# Patient Record
Sex: Female | Born: 1950 | Race: Black or African American | Hispanic: No | Smoking: Current every day smoker
Health system: Southern US, Community
[De-identification: ages and names within clinical notes are randomized; demographics above are authoritative.]

## PROBLEM LIST (undated history)

## (undated) DIAGNOSIS — E785 Hyperlipidemia, unspecified: Secondary | ICD-10-CM

## (undated) DIAGNOSIS — M310 Hypersensitivity angiitis: Secondary | ICD-10-CM

## (undated) DIAGNOSIS — I639 Cerebral infarction, unspecified: Secondary | ICD-10-CM

## (undated) DIAGNOSIS — C50912 Malignant neoplasm of unspecified site of left female breast: Secondary | ICD-10-CM

## (undated) DIAGNOSIS — J449 Chronic obstructive pulmonary disease, unspecified: Secondary | ICD-10-CM

## (undated) DIAGNOSIS — I10 Essential (primary) hypertension: Secondary | ICD-10-CM

## (undated) DIAGNOSIS — C50919 Malignant neoplasm of unspecified site of unspecified female breast: Secondary | ICD-10-CM

## (undated) DIAGNOSIS — C50911 Malignant neoplasm of unspecified site of right female breast: Secondary | ICD-10-CM

## (undated) HISTORY — DX: Hypersensitivity angiitis: M31.0

## (undated) HISTORY — DX: Malignant neoplasm of unspecified site of right female breast: C50.911

## (undated) HISTORY — DX: Chronic obstructive pulmonary disease, unspecified: J44.9

## (undated) HISTORY — PX: MASTECTOMY, RADICAL: SHX710

## (undated) HISTORY — PX: PORT-A-CATH REMOVAL: SHX5289

## (undated) HISTORY — PX: BREAST LUMPECTOMY: SHX2

## (undated) HISTORY — DX: Malignant neoplasm of unspecified site of left female breast: C50.912

## (undated) HISTORY — DX: Malignant neoplasm of unspecified site of unspecified female breast: C50.919

## (undated) HISTORY — DX: Cerebral infarction, unspecified: I63.9

---

## 2001-02-14 ENCOUNTER — Encounter (HOSPITAL_COMMUNITY): Admission: RE | Admit: 2001-02-14 | Discharge: 2001-03-16 | Payer: Self-pay | Admitting: Oncology

## 2001-02-14 ENCOUNTER — Encounter: Admission: RE | Admit: 2001-02-14 | Discharge: 2001-02-14 | Payer: Self-pay | Admitting: Oncology

## 2001-03-10 ENCOUNTER — Other Ambulatory Visit: Admission: RE | Admit: 2001-03-10 | Discharge: 2001-03-10 | Payer: Self-pay | Admitting: Family Medicine

## 2004-03-11 ENCOUNTER — Ambulatory Visit (HOSPITAL_COMMUNITY): Admission: RE | Admit: 2004-03-11 | Discharge: 2004-03-11 | Payer: Self-pay | Admitting: Family Medicine

## 2004-07-15 ENCOUNTER — Encounter (HOSPITAL_COMMUNITY): Admission: RE | Admit: 2004-07-15 | Discharge: 2004-07-16 | Payer: Self-pay | Admitting: Internal Medicine

## 2005-03-30 ENCOUNTER — Encounter (HOSPITAL_COMMUNITY): Admission: RE | Admit: 2005-03-30 | Discharge: 2005-04-29 | Payer: Self-pay | Admitting: Oncology

## 2005-03-30 ENCOUNTER — Ambulatory Visit (HOSPITAL_COMMUNITY): Payer: Self-pay | Admitting: Oncology

## 2005-03-30 ENCOUNTER — Encounter: Admission: RE | Admit: 2005-03-30 | Discharge: 2005-03-30 | Payer: Self-pay | Admitting: Oncology

## 2005-07-20 ENCOUNTER — Ambulatory Visit (HOSPITAL_COMMUNITY): Admission: RE | Admit: 2005-07-20 | Discharge: 2005-07-20 | Payer: Self-pay | Admitting: Internal Medicine

## 2005-07-20 ENCOUNTER — Encounter: Payer: Self-pay | Admitting: Internal Medicine

## 2005-07-20 ENCOUNTER — Ambulatory Visit: Payer: Self-pay | Admitting: Internal Medicine

## 2005-07-20 HISTORY — PX: COLONOSCOPY: SHX174

## 2006-03-08 ENCOUNTER — Ambulatory Visit (HOSPITAL_COMMUNITY): Admission: RE | Admit: 2006-03-08 | Discharge: 2006-03-08 | Payer: Self-pay | Admitting: Family Medicine

## 2006-03-15 ENCOUNTER — Encounter (INDEPENDENT_AMBULATORY_CARE_PROVIDER_SITE_OTHER): Payer: Self-pay | Admitting: Diagnostic Radiology

## 2006-03-15 ENCOUNTER — Encounter (INDEPENDENT_AMBULATORY_CARE_PROVIDER_SITE_OTHER): Payer: Self-pay | Admitting: *Deleted

## 2006-03-15 ENCOUNTER — Encounter: Admission: RE | Admit: 2006-03-15 | Discharge: 2006-03-15 | Payer: Self-pay | Admitting: Family Medicine

## 2006-03-22 ENCOUNTER — Ambulatory Visit (HOSPITAL_COMMUNITY): Admission: RE | Admit: 2006-03-22 | Discharge: 2006-03-22 | Payer: Self-pay | Admitting: General Surgery

## 2006-03-30 ENCOUNTER — Encounter: Admission: RE | Admit: 2006-03-30 | Discharge: 2006-03-30 | Payer: Self-pay | Admitting: Oncology

## 2006-03-30 ENCOUNTER — Encounter (HOSPITAL_COMMUNITY): Admission: RE | Admit: 2006-03-30 | Discharge: 2006-04-29 | Payer: Self-pay | Admitting: Oncology

## 2006-03-30 ENCOUNTER — Ambulatory Visit (HOSPITAL_COMMUNITY): Payer: Self-pay | Admitting: Oncology

## 2006-04-19 ENCOUNTER — Encounter: Admission: RE | Admit: 2006-04-19 | Discharge: 2006-04-19 | Payer: Self-pay | Admitting: General Surgery

## 2006-04-22 ENCOUNTER — Encounter: Admission: RE | Admit: 2006-04-22 | Discharge: 2006-04-22 | Payer: Self-pay | Admitting: General Surgery

## 2006-04-22 ENCOUNTER — Encounter (INDEPENDENT_AMBULATORY_CARE_PROVIDER_SITE_OTHER): Payer: Self-pay | Admitting: Specialist

## 2006-04-22 ENCOUNTER — Ambulatory Visit (HOSPITAL_BASED_OUTPATIENT_CLINIC_OR_DEPARTMENT_OTHER): Admission: RE | Admit: 2006-04-22 | Discharge: 2006-04-22 | Payer: Self-pay | Admitting: General Surgery

## 2006-04-23 ENCOUNTER — Emergency Department (HOSPITAL_COMMUNITY): Admission: EM | Admit: 2006-04-23 | Discharge: 2006-04-23 | Payer: Self-pay | Admitting: Emergency Medicine

## 2006-05-06 ENCOUNTER — Encounter (HOSPITAL_COMMUNITY): Admission: RE | Admit: 2006-05-06 | Discharge: 2006-06-05 | Payer: Self-pay | Admitting: Oncology

## 2006-05-06 ENCOUNTER — Encounter: Admission: RE | Admit: 2006-05-06 | Discharge: 2006-05-06 | Payer: Self-pay | Admitting: Oncology

## 2006-05-26 ENCOUNTER — Ambulatory Visit (HOSPITAL_BASED_OUTPATIENT_CLINIC_OR_DEPARTMENT_OTHER): Admission: RE | Admit: 2006-05-26 | Discharge: 2006-05-26 | Payer: Self-pay | Admitting: General Surgery

## 2006-05-31 ENCOUNTER — Ambulatory Visit (HOSPITAL_COMMUNITY): Payer: Self-pay | Admitting: Oncology

## 2006-06-09 ENCOUNTER — Encounter: Admission: RE | Admit: 2006-06-09 | Discharge: 2006-06-12 | Payer: Self-pay | Admitting: Oncology

## 2006-06-14 ENCOUNTER — Encounter: Admission: RE | Admit: 2006-06-14 | Discharge: 2006-06-14 | Payer: Self-pay | Admitting: Oncology

## 2006-06-14 ENCOUNTER — Encounter (HOSPITAL_COMMUNITY): Admission: RE | Admit: 2006-06-14 | Discharge: 2006-07-14 | Payer: Self-pay | Admitting: Oncology

## 2006-07-01 ENCOUNTER — Ambulatory Visit: Admission: RE | Admit: 2006-07-01 | Discharge: 2006-08-30 | Payer: Self-pay | Admitting: Radiation Oncology

## 2006-07-26 ENCOUNTER — Encounter: Admission: RE | Admit: 2006-07-26 | Discharge: 2006-07-26 | Payer: Self-pay | Admitting: Oncology

## 2006-07-26 ENCOUNTER — Ambulatory Visit (HOSPITAL_COMMUNITY): Payer: Self-pay | Admitting: Oncology

## 2006-07-26 ENCOUNTER — Encounter (HOSPITAL_COMMUNITY): Admission: RE | Admit: 2006-07-26 | Discharge: 2006-08-25 | Payer: Self-pay | Admitting: Oncology

## 2006-09-08 ENCOUNTER — Encounter (HOSPITAL_COMMUNITY): Admission: RE | Admit: 2006-09-08 | Discharge: 2006-09-13 | Payer: Self-pay | Admitting: Oncology

## 2006-09-16 ENCOUNTER — Encounter (HOSPITAL_COMMUNITY): Admission: RE | Admit: 2006-09-16 | Discharge: 2006-10-16 | Payer: Self-pay | Admitting: Oncology

## 2006-09-22 ENCOUNTER — Ambulatory Visit (HOSPITAL_COMMUNITY): Payer: Self-pay | Admitting: Oncology

## 2006-10-20 ENCOUNTER — Encounter (HOSPITAL_COMMUNITY): Admission: RE | Admit: 2006-10-20 | Discharge: 2006-11-19 | Payer: Self-pay | Admitting: Oncology

## 2006-10-27 ENCOUNTER — Ambulatory Visit (HOSPITAL_COMMUNITY): Admission: RE | Admit: 2006-10-27 | Discharge: 2006-10-27 | Payer: Self-pay | Admitting: Oncology

## 2006-11-10 ENCOUNTER — Ambulatory Visit (HOSPITAL_COMMUNITY): Payer: Self-pay | Admitting: Oncology

## 2007-01-05 ENCOUNTER — Encounter (HOSPITAL_COMMUNITY): Admission: RE | Admit: 2007-01-05 | Discharge: 2007-02-04 | Payer: Self-pay | Admitting: Oncology

## 2007-01-08 ENCOUNTER — Ambulatory Visit (HOSPITAL_COMMUNITY): Admission: RE | Admit: 2007-01-08 | Discharge: 2007-01-08 | Payer: Self-pay | Admitting: Family Medicine

## 2007-01-10 ENCOUNTER — Inpatient Hospital Stay (HOSPITAL_COMMUNITY): Admission: EM | Admit: 2007-01-10 | Discharge: 2007-01-25 | Payer: Self-pay | Admitting: Emergency Medicine

## 2007-01-11 ENCOUNTER — Ambulatory Visit: Payer: Self-pay | Admitting: Oncology

## 2007-01-13 ENCOUNTER — Ambulatory Visit: Payer: Self-pay | Admitting: Gastroenterology

## 2007-01-16 ENCOUNTER — Ambulatory Visit: Payer: Self-pay | Admitting: Internal Medicine

## 2007-01-17 ENCOUNTER — Ambulatory Visit: Payer: Self-pay | Admitting: Internal Medicine

## 2007-02-08 ENCOUNTER — Ambulatory Visit (HOSPITAL_COMMUNITY): Payer: Self-pay | Admitting: Oncology

## 2007-02-15 ENCOUNTER — Ambulatory Visit: Payer: Self-pay | Admitting: Internal Medicine

## 2007-03-22 ENCOUNTER — Encounter (HOSPITAL_COMMUNITY): Admission: RE | Admit: 2007-03-22 | Discharge: 2007-04-21 | Payer: Self-pay | Admitting: Oncology

## 2007-05-06 ENCOUNTER — Encounter (HOSPITAL_COMMUNITY): Admission: RE | Admit: 2007-05-06 | Discharge: 2007-06-05 | Payer: Self-pay | Admitting: Oncology

## 2007-05-06 ENCOUNTER — Ambulatory Visit (HOSPITAL_COMMUNITY): Payer: Self-pay | Admitting: Oncology

## 2007-06-08 ENCOUNTER — Ambulatory Visit (HOSPITAL_COMMUNITY): Admission: RE | Admit: 2007-06-08 | Discharge: 2007-06-08 | Payer: Self-pay | Admitting: *Deleted

## 2007-06-17 ENCOUNTER — Encounter (HOSPITAL_COMMUNITY): Admission: RE | Admit: 2007-06-17 | Discharge: 2007-07-17 | Payer: Self-pay | Admitting: *Deleted

## 2007-07-17 ENCOUNTER — Emergency Department (HOSPITAL_COMMUNITY): Admission: EM | Admit: 2007-07-17 | Discharge: 2007-07-17 | Payer: Self-pay | Admitting: Emergency Medicine

## 2007-08-10 ENCOUNTER — Encounter (HOSPITAL_COMMUNITY): Admission: RE | Admit: 2007-08-10 | Discharge: 2007-09-09 | Payer: Self-pay | Admitting: Oncology

## 2007-08-10 ENCOUNTER — Ambulatory Visit (HOSPITAL_COMMUNITY): Payer: Self-pay | Admitting: Oncology

## 2007-09-21 ENCOUNTER — Encounter (HOSPITAL_COMMUNITY): Admission: RE | Admit: 2007-09-21 | Discharge: 2007-10-21 | Payer: Self-pay | Admitting: Oncology

## 2007-09-26 ENCOUNTER — Ambulatory Visit (HOSPITAL_COMMUNITY): Payer: Self-pay | Admitting: Oncology

## 2007-12-14 ENCOUNTER — Ambulatory Visit (HOSPITAL_COMMUNITY): Payer: Self-pay | Admitting: Oncology

## 2008-03-07 ENCOUNTER — Ambulatory Visit (HOSPITAL_COMMUNITY): Payer: Self-pay | Admitting: Oncology

## 2008-06-27 ENCOUNTER — Ambulatory Visit (HOSPITAL_COMMUNITY): Payer: Self-pay | Admitting: Oncology

## 2008-08-14 ENCOUNTER — Ambulatory Visit (HOSPITAL_COMMUNITY): Payer: Self-pay | Admitting: Oncology

## 2008-10-25 ENCOUNTER — Ambulatory Visit (HOSPITAL_COMMUNITY): Payer: Self-pay | Admitting: Oncology

## 2009-02-07 ENCOUNTER — Ambulatory Visit (HOSPITAL_COMMUNITY): Payer: Self-pay | Admitting: Oncology

## 2009-07-05 ENCOUNTER — Encounter (HOSPITAL_COMMUNITY): Admission: RE | Admit: 2009-07-05 | Discharge: 2009-08-04 | Payer: Self-pay | Admitting: Oncology

## 2009-07-05 ENCOUNTER — Ambulatory Visit (HOSPITAL_COMMUNITY): Payer: Self-pay | Admitting: Oncology

## 2009-07-11 ENCOUNTER — Ambulatory Visit (HOSPITAL_COMMUNITY): Admission: RE | Admit: 2009-07-11 | Discharge: 2009-07-11 | Payer: Self-pay | Admitting: Family Medicine

## 2009-08-26 ENCOUNTER — Other Ambulatory Visit: Admission: RE | Admit: 2009-08-26 | Discharge: 2009-08-26 | Payer: Self-pay | Admitting: Family Medicine

## 2009-10-25 ENCOUNTER — Ambulatory Visit (HOSPITAL_COMMUNITY): Payer: Self-pay | Admitting: Oncology

## 2009-12-20 ENCOUNTER — Encounter (HOSPITAL_COMMUNITY): Admission: RE | Admit: 2009-12-20 | Discharge: 2010-01-19 | Payer: Self-pay | Admitting: Oncology

## 2009-12-20 ENCOUNTER — Ambulatory Visit (HOSPITAL_COMMUNITY): Payer: Self-pay | Admitting: Oncology

## 2010-02-14 ENCOUNTER — Ambulatory Visit (HOSPITAL_COMMUNITY): Payer: Self-pay | Admitting: Oncology

## 2010-04-23 ENCOUNTER — Ambulatory Visit (HOSPITAL_COMMUNITY): Payer: Self-pay | Admitting: Oncology

## 2010-07-16 ENCOUNTER — Ambulatory Visit (HOSPITAL_COMMUNITY): Admission: RE | Admit: 2010-07-16 | Discharge: 2010-07-16 | Payer: Self-pay | Admitting: Oncology

## 2010-07-23 ENCOUNTER — Ambulatory Visit (HOSPITAL_COMMUNITY): Payer: Self-pay | Admitting: Oncology

## 2010-07-23 ENCOUNTER — Encounter (HOSPITAL_COMMUNITY)
Admission: RE | Admit: 2010-07-23 | Discharge: 2010-08-22 | Payer: Self-pay | Source: Home / Self Care | Attending: Oncology | Admitting: Oncology

## 2010-09-19 ENCOUNTER — Encounter (HOSPITAL_COMMUNITY): Admission: RE | Admit: 2010-09-19 | Payer: Self-pay | Source: Home / Self Care | Admitting: Oncology

## 2010-09-19 ENCOUNTER — Ambulatory Visit (HOSPITAL_COMMUNITY): Admit: 2010-09-19 | Payer: Self-pay | Admitting: Oncology

## 2010-10-05 ENCOUNTER — Encounter (HOSPITAL_COMMUNITY): Payer: Self-pay | Admitting: Oncology

## 2010-10-30 ENCOUNTER — Other Ambulatory Visit (HOSPITAL_COMMUNITY): Payer: Medicare Other

## 2010-10-30 ENCOUNTER — Encounter (HOSPITAL_COMMUNITY): Payer: Medicare Other | Attending: Oncology

## 2010-10-30 DIAGNOSIS — C50919 Malignant neoplasm of unspecified site of unspecified female breast: Secondary | ICD-10-CM

## 2010-10-30 DIAGNOSIS — Z452 Encounter for adjustment and management of vascular access device: Secondary | ICD-10-CM

## 2010-12-03 LAB — COMPREHENSIVE METABOLIC PANEL
AST: 25 U/L (ref 0–37)
BUN: 8 mg/dL (ref 6–23)
CO2: 27 mEq/L (ref 19–32)
Chloride: 101 mEq/L (ref 96–112)
Creatinine, Ser: 0.81 mg/dL (ref 0.4–1.2)
GFR calc non Af Amer: >60 mL/min (ref >60)
Total Bilirubin: 0.4 mg/dL (ref 0.3–1.2)

## 2010-12-03 LAB — CBC
HCT: 41.1 % (ref 36.0–46.0)
Hemoglobin: 14.1 g/dL (ref 12.0–15.0)
RBC: 4.57 MIL/uL (ref 3.87–5.11)
WBC: 10.7 10*3/uL — ABNORMAL HIGH (ref 4.0–10.5)

## 2010-12-11 ENCOUNTER — Encounter (HOSPITAL_COMMUNITY): Payer: Medicare Other

## 2010-12-18 LAB — DIFFERENTIAL
Eosinophils Absolute: 0.1 10*3/uL (ref 0.0–0.7)
Eosinophils Relative: 1 % (ref 0–5)
Lymphs Abs: 2.4 10*3/uL (ref 0.7–4.0)
Monocytes Absolute: 0.6 10*3/uL (ref 0.1–1.0)
Monocytes Relative: 6 % (ref 3–12)

## 2010-12-18 LAB — COMPREHENSIVE METABOLIC PANEL
ALT: 18 U/L (ref 0–35)
AST: 21 U/L (ref 0–37)
Alkaline Phosphatase: 150 U/L — ABNORMAL HIGH (ref 39–117)
Glucose, Bld: 143 mg/dL — ABNORMAL HIGH (ref 70–99)
Potassium: 3.7 mEq/L (ref 3.5–5.1)
Sodium: 137 mEq/L (ref 135–145)
Total Protein: 7.2 g/dL (ref 6.0–8.3)

## 2010-12-18 LAB — CBC
MCHC: 33.5 g/dL (ref 30.0–36.0)
RBC: 4.56 MIL/uL (ref 3.87–5.11)
WBC: 10 10*3/uL (ref 4.0–10.5)

## 2011-01-05 ENCOUNTER — Other Ambulatory Visit: Payer: Self-pay | Admitting: General Surgery

## 2011-01-05 ENCOUNTER — Encounter (HOSPITAL_COMMUNITY): Payer: Medicare Other

## 2011-01-05 LAB — BASIC METABOLIC PANEL
Calcium: 9.5 mg/dL (ref 8.4–10.5)
Creatinine, Ser: 0.71 mg/dL (ref 0.4–1.2)
GFR calc non Af Amer: >60 mL/min (ref >60)
Sodium: 137 mEq/L (ref 135–145)

## 2011-01-05 LAB — SURGICAL PCR SCREEN: MRSA, PCR: NEGATIVE

## 2011-01-05 LAB — DIFFERENTIAL
Basophils Relative: 0 % (ref 0–1)
Eosinophils Absolute: 0.1 10*3/uL (ref 0.0–0.7)
Eosinophils Relative: 1 % (ref 0–5)
Monocytes Absolute: 0.5 10*3/uL (ref 0.1–1.0)
Monocytes Relative: 5 % (ref 3–12)

## 2011-01-05 LAB — CBC
MCH: 30.1 pg (ref 26.0–34.0)
MCHC: 33.1 g/dL (ref 30.0–36.0)
MCV: 90.9 fL (ref 78.0–100.0)
Platelets: 268 10*3/uL (ref 150–400)

## 2011-01-06 ENCOUNTER — Ambulatory Visit (HOSPITAL_COMMUNITY)
Admission: RE | Admit: 2011-01-06 | Discharge: 2011-01-06 | Disposition: A | Discharge status: Home / Self Care | Payer: Medicare Other | Source: Ambulatory Visit | Attending: General Surgery | Admitting: General Surgery

## 2011-01-06 DIAGNOSIS — Z79899 Other long term (current) drug therapy: Secondary | ICD-10-CM | POA: Insufficient documentation

## 2011-01-06 DIAGNOSIS — C50919 Malignant neoplasm of unspecified site of unspecified female breast: Secondary | ICD-10-CM | POA: Insufficient documentation

## 2011-01-06 DIAGNOSIS — I10 Essential (primary) hypertension: Secondary | ICD-10-CM | POA: Insufficient documentation

## 2011-01-06 DIAGNOSIS — Z452 Encounter for adjustment and management of vascular access device: Secondary | ICD-10-CM | POA: Insufficient documentation

## 2011-01-06 DIAGNOSIS — E119 Type 2 diabetes mellitus without complications: Secondary | ICD-10-CM | POA: Insufficient documentation

## 2011-01-07 NOTE — Op Note (Signed)
  NAME:  Madison Todd, Madison Todd NO.:  [id number]  MEDICAL RECORD NO.:  [id number]           PATIENT TYPE:  O  LOCATION:  DAYP                          FACILITY:  [facility]  PHYSICIAN:  Barbaraann Barthel, M.D. DATE OF BIRTH:  11/17/1950  DATE OF PROCEDURE:  01/06/2011 DATE OF DISCHARGE:                              OPERATIVE REPORT   SURGEON:  Barbaraann Barthel, MD  PREOPERATIVE DIAGNOSIS:  Status post insertion of right subclavian Port- A-Cath for treatment of carcinoma of the breast.  POSTOPERATIVE DIAGNOSIS:  Status post insertion of right subclavian Port- A-Cath for treatment of carcinoma of the breast.  PROCEDURE:  Removal of right subclavian Port-A-Cath.  SPECIMEN:  Port-A-Cath and infusion device.  WOUND CLASSIFICATION:  Clean.  NOTE:  This is a 60 year old black female who was treated for carcinoma of the breast with chemotherapy by Port-A-Cath and after having this in place for over 5 years, This was requested to be removed.  We discussed complications, not limited to but including bleeding, infection and catheter embolization.  Informed consent was obtained.  GROSS OPERATIVE FINDINGS:  The patient had the expected pseudocyst around the catheter and the infusion device.  There were no other problems.  TECHNIQUE:  The patient was placed in the supine position and then later in the Trendelenburg position, Her right hemithorax was prepped with Betadine solution and draped in the usual manner.  An incision was carried out over the palpated infusion device and we dissected through the pseudocapsule and grasped the Port-A-Cath infusion device and clamped the pseudocapsule around the catheter and removed this.  This capsule around the catheter tip was ligated with 3-0 Vicryl.  We then closed the wound after irrigating it with normal saline solution with 4- 0 Vicryl and then, the wound was closed subcuticularly with a 5-0 Vicryl suture.  Steri-Strips, 2 x 2 with  Neosporin dressing and an OpSite dressing was applied.  Prior to closure, all sponge, needle, and instrument counts were found to be correct.  Estimated blood loss was minimal.  The patient received 300 mL of crystalloids intraoperatively.  No drains were placed and there were no complications.     Barbaraann Barthel, M.D.     WB/MEDQ  D:  01/06/2011  T:  01/06/2011  Job:  [id number]  cc:   Ladona Horns. Mariel Sleet, MD Fax: [phone]  Electronically Signed by Barbaraann Barthel M.D. on 01/07/2011 03:36:07 PM

## 2011-01-27 NOTE — Assessment & Plan Note (Signed)
NAME:  JANARIA, MCCAMMON         CHART#:  [id number]   DATE:  02/15/2007                       DOB:  12-05-1950   CHIEF COMPLAINT:  Follow up hospitalization.   SUBJECTIVE:  Ms. Lengyel is 60 year old African American female who  had a very complicated hospital course. She was admitted to [facility] recently with epigastric pain and nausea. She was actually  scheduled to have an EGD as an outpatient earlier that week when she was  noted to have fever and generalized rash. It was felt that she had  diffuse varicella zoster , however after further evaluation by a  dermatologist, it was felt that she may have been having a drug reaction  to Aromasin. She had a very stormy course during her hospitalization.  She was found to have cardiomegaly and CHF. She also developed  respiratory failure and was placed on the ventilator. While hospitalized  on 01/17/07, she was found to have gall bladder wall thickening with gall  bladder sludge, could not exclude early acalculous coleocystitis,  questionable adenopathy at the peripancreatic and superior to the left  kidney, minimal non-specific echogenicity of the liver. She had a HIDA  scan which showed a small contracted gall bladder with a patent cystic  duct, duodenal gastric biliary reflux and no evidence of obstruction.  She had a CT angiography of the chest which showed cardiomegaly,  congestive heart failure, bilateral lower lobe atelectasis, and possibly  pneumonia. She is status post right mastectomy. She had a CT of the  abdomen and pelvis with contrast which showed mildly prominent  retroperitoneal upper abdominal lymph nodes, possibly reactive, mild  hepatic edema, which can be seen with hepatitis and right heart failure,  and mild sludge in the gall bladder. She was eventually transferred to  [facility] and spent 4 days there in  the ICU. She tells me that she has been home for about two  weeks now.  She tells me that her epigastric pain has resolved since she has been  home. She denies any nausea or vomiting. Her appetite is great. She did  have some constipation to begin with. She is taking an over-the-counter  stool softener. She is having good results. She has had to do 1 enema  since she has been home. She is on Nexium 40 mg daily and she denies any  problems with heartburn, indigestion, dysphagia, or odynophagia.   CURRENT MEDICATIONS:  See the updated list from 02/15/07.   ALLERGIES:  AROMASIN & HERCEPTIN (shortness of breath)   OBJECTIVE:  VITAL SIGNS:  Weight 155 1/2 pounds, height 53.75 inches,  temperature 97.7, blood pressure 110/60, pulse 72.  GENERAL:  Ms. Oats is a well developed, well nourished African  American female in no acute distress.  HEENT:  Sclerae clear, nonicteric, conjunctivae pink. Oropharynx pink  and moist without any lesions.  CHEST:  Regular rate and rhythm, normal S1, S2.  ABDOMEN:  Positive bowel sounds x4, no rebound, soft, nontender,  nondistended, without palpable mass or hepatosplenomegaly. No rebound  tenderness or guarding.  EXTREMITIES:  Without clubbing or edema or bilaterally.  SKIN:  She does have well healing circular lesions with hypopigmented  centers without any exudates at this point.   ASSESSMENT:  Ms. Trigueros is a 60 year old female with a very stormy  recent hospital course. It  appears that she was actually scheduled for  an EGD which was postponed due to what appeared to be a drug reaction to  Aromasin. It was actually felt that she may have diffuse varicella  zoster, nonetheless she ended up respiratory failure, and she was  eventually transferred to [facility]. She is doing quite well  since discharge. She denies any epigastric pain at this point. Her  appetite has resumed. Overall, she is doing quite well.   PLAN:  1. She is going to continue Nexium 40 mg daily.  2. Recheck LFTs and CBC given  her history of anemia.  3. Pending labs to see how she does, she may need follow up imaging of      her biliary system and gall bladder.  4. I would also consider following up on EGD, however I would like to      hold off until she recovers from this acute reaction.       Nicholas Lose, N.P.  Electronically Signed     R. Roetta Sessions, M.D.  Electronically Signed    KC/MEDQ  D:  02/16/2007  T:  02/17/2007  Job:  [id number]   cc:   Ladona Horns. Mariel Sleet, MD  Annia Friendly. Loleta Chance, MD

## 2011-01-27 NOTE — Discharge Summary (Signed)
NAME:  BEXLEY, LAUBACH NO.:  [id number]   MEDICAL RECORD NO.:  [id number]          PATIENT TYPE:  INP   LOCATION:  IC08                          FACILITY:  [facility]   PHYSICIAN:  Skeet Latch, DO    DATE OF BIRTH:  1951-03-24   DATE OF ADMISSION:  01/10/2007  DATE OF DISCHARGE:  05/12/2008LH                               DISCHARGE SUMMARY   DISCHARGE DIAGNOSES:  1. Acute respiratory distress.  2. Rash.  3. Vasculitis.  4. History of breast cancer.  5. Chronic obstructive pulmonary disease.  6. Varicella zoster.   BRIEF HOSPITAL COURSE:  Ms. Guedes is a 60 year old African  American female who had a very complicated hospital stay at [facility].  She was admitted through the ER for epigastric pain and  nausea.  The patient was scheduled to have an EGD as an outpatient  earlier, and was noted to have a fever and generalized rash and was sent  to the emergency room for evaluation.  Her rash progressed.  Dermatology  was consulted and it was felt that it may be diffuse varicella zoster.  The patient was placed on IV antiviral medication.  It was confusing if  the patient was having an actual drug reaction or if this was a  varicellar zoster.  During the hospital stay, the patient was found to  have cardiomegaly or CHF.  She subsequently developed respiratory  failure and was placed on ventilator.  The patient was kept on a  ventilator for a few days.  Pulmonology was consulted during that time  and followed patient also.  The patient was also found to have  gallbladder wall thickening and gallbladder sludge and questionable  adenopathy.  Pancreatic is superior to the left kidney.  The patient had  HIDA scan which showed small contracted gallbladder with hepatic cystic  duct, duodenal gastrobiliary reflux and no evidence of obstruction.  The  patient had multiple chest x-rays and CT of her abdomen with above  results.  Another CT of her abdomen and  pelvis showed mildly prominent  retroperitoneal in the upper abdomen, abdominal lymph nodes, possibly  reactive mild to hepatic edema which can be seen with hepatitis and  right heart failure and mild sludge in the gallbladder.  Secondary to  patient's complicated history with her rash, CHF and her respiratory  status, it was felt that the patient needed to be sent to [facility] for more acute care.   CONDITION ON DISCHARGE:  Stable.   CONSULTATIONS:  Hematology, Pulmonology, Dermatology.   DISPOSITION:  Burgess Estelle, the patient was transferred to [facility]  under the care of Dr. Mallie Mussel instable condition.      Skeet Latch, DO  Electronically Signed     SM/MEDQ  D:  02/28/2007  T:  02/28/2007  Job:  [id number]

## 2011-01-30 NOTE — Group Therapy Note (Signed)
NAME:  Madison Todd, Madison Todd NO.:  [id number]   MEDICAL RECORD NO.:  [id number]          PATIENT TYPE:  INP   LOCATION:  IC08                          FACILITY:  [facility]   PHYSICIAN:  Skeet Latch, DO    DATE OF BIRTH:  05-03-1951   DATE OF PROCEDURE:  01/22/2007  DATE OF DISCHARGE:                                 PROGRESS NOTE   SUBJECTIVE:  Patient is lying in bed.  Patient today is complaining of  shortness of breath.  Patient states that she has not exerted herself  anymore than usual but states that she is short of breath today.  Patient also states that her lesions continue to drain especially on her  lower extremities.  Patient states that she has not noticed any new  lesions at this time.  Patient denies any significant pain at this time  but is desiring to go home as soon as possible.   OBJECTIVE:  VITAL SIGNS:  Temperature 97.3, pulse 98, respirations 18,  blood pressure 134/92.  Patient is saturating 95% on 2 liters of oxygen.  CARDIOVASCULAR:  Regular rate and rhythm. No murmurs, rubs, or gallops.  S1-S2.  RESPIRATORY:  Some coarse sounds but clear to auscultation.  No rales,  rhonchi, or wheezing.  ABDOMEN: Soft, non-tender, non-distended.  EXTREMITIES: No cyanosis, clubbing, or erythema.  SKIN:  Still has multiple vascular-type lesions on upper and lower  extremities.  They seem to be draining more often especially on her feet  at this time.   LABORATORY DATA:  White blood cell count 183, hemoglobin 9.9, hematocrit  29.0, platelet count 505,000.  Sodium 139, potassium 4.5, chloride 104,  carbon dioxide, 31, glucose 244, BUN 8, creatinine 0.58, calcium 8.3.   ASSESSMENT/PLAN:  1. Varicella zoster.  Apparently the patient had a positive IgM.      Patient is no longer on Valtrex at this time.  Varicella outbreak      appears to be stable at this point.  Patient will need follow up      with dermatology as an outpatient.  2. Vasculitis.  Patient  continues to be on oral steroids with a taper      type dose.  Unknown which drug may have preceded the vasculitis at      this time.  3. History of breast cancer.  Patient will continue to be followed by      Oncology.  4. COPD.  Patient complains of shortness of breath today.  Her chest x-      ray did not show any acute process at this time, however will      decrease the schedule of her breathing treatments and also place      patient on inhalers.  5  Anticipate the patient will be discharged in 1-2 days if stable.  As  stated, the patient will need outpatient dermatologist.  Patient may  need an infectious disease consult in the near future depending on what  dermatology recommends.      Skeet Latch, DO    SM/MEDQ  D:  01/22/2007  T:  01/22/2007  Job:  [id number]

## 2011-01-30 NOTE — Group Therapy Note (Signed)
NAME:  Madison Todd, Madison Todd NO.:  [id number]   MEDICAL RECORD NO.:  [id number]          PATIENT TYPE:  INP   LOCATION:  IC08                          FACILITY:  [facility]   PHYSICIAN:  Edward L. Juanetta Gosling, M.D.DATE OF BIRTH:  11-16-1950   DATE OF PROCEDURE:  DATE OF DISCHARGE:                                 PROGRESS NOTE   SUBJECTIVE:  Madison Todd is much more alert, awake.  She is overall  doing better.  Her pulse in the 80's, blood pressure in the 130's.  She  now has an NG tube and her medicines are being switched to by NG.  She  is on tube feedings.   PHYSICAL EXAMINATION:  CHEST:  Exam shows her chest is clearer but she  still has some rhonchi bilaterally.  HEART:  Is regular without gallop.  ABDOMEN:  Soft.  EXTREMITIES:  Still show the skin changes.   LABORATORY DATA:  Her white count now is down 16,400, hemoglobin 9.6,  platelets 500,000 and blood gas 40% on 40% 600 rate of 10, 5 of PEEP,  shows pH 7.41, pCO2 of still 45, pO2 of 95.  Comp metabolic profile  blood sugars 272.  Electrolytes:  BUN and creatinine are normal.  SGOT  56, SGPT 150, albumin is 2.   ASSESSMENT:  She is better.   PLAN:  See if we can wean per protocol today.      Edward L. Juanetta Gosling, M.D.  Electronically Signed     ELH/MEDQ  D:  01/24/2007  T:  01/24/2007  Job:  [id number]

## 2011-01-30 NOTE — Consult Note (Signed)
NAME:  Madison Todd, Madison Todd NO.:  [id number]   MEDICAL RECORD NO.:  [id number]          PATIENT TYPE:  INP   LOCATION:  A341                          FACILITY:  [facility]   PHYSICIAN:  Kassie Mends, M.D.      DATE OF BIRTH:  1950/10/21   DATE OF CONSULTATION:  01/13/2007  DATE OF DISCHARGE:                                 CONSULTATION   REQUESTING PHYSICIAN:  [facility]   REASON FOR CONSULTATION:  Epigastric pain, EGD.   HISTORY OF PRESENT ILLNESS:  The patient is a 60 year old African-  American female with history of breast cancer (last chemotherapy in  February, 2008, discontinued secondary to abnormal MUGA per patient) who  complains of a couple of weeks' history of epigastric pain which is  worse postprandially.  She was actually scheduled to have an EGD as an  outpatient earlier this week when she was noted to have fever and  generalized rash.  She was sent to the emergency department for  evaluation and admitted.  She complains of epigastric pain which is  intermittent.  It seems to be worse with meals.  She has had some nausea  and heaves but no vomiting.  Denies any dysphagia or odynophagia,  heartburn.  She does complain of constipation, last bowel movement  approximately 1 week ago.  Denies any melena or bright red blood per  rectum.   HOME MEDICATIONS:  1. Vicodin 5/500 p.r.n.  2. Potassium chloride 20 mEq daily.  3. Aromasin 25 mg daily.  4. Lasix 40 mg daily.  5. Aspirin 81 mg daily.  6. Nexium 40 mg daily.  7. Diltiazem 120 mg daily.   ALLERGIES:  No known drug allergies.   PAST MEDICAL HISTORY:  1. History of breast cancer, initially of the right breast, status      post mastectomy several years ago.  She underwent chemotherapy at      the time.  She then developed left breast cancer and underwent      lumpectomy in August, 2007.  Last chemotherapy was in February,      2008, and had to be discontinued due to cardiomyopathy.  Last  radiation therapy was in the fall of 2007.  2. She has Port-A-Cath placement.  3. She had colonoscopy November, 2006, which revealed diminutive      ascending colon polyp measuring 3 mm.  4. COPD.  5. Hypertension.   FAMILY HISTORY:  Negative for colorectal cancer or chronic GI illnesses.   SOCIAL HISTORY:  She has 3 children.  Recently quit tobacco use  approximately 1 month ago.  No alcohol use.   REVIEW OF SYSTEMS:  GI:  See HPI.  CARDIOPULMONARY:  Complains of  shortness of breath.  No chest pain.   PHYSICAL EXAMINATION:  VITAL SIGNS:  T max 100.7, T current 98.9, pulse  84, respirations 20, blood pressure 138/78.  GENERAL:  Pleasant middle-aged African-American female who is resting  comfortably.  SKIN:  Warm and dry.  No jaundice.  She has pustule like lesions  generalized over her body.  HEENT:  Sclerae nonicteric.  Oropharyngeal mucosa moist and pink.  CHEST:  Lungs clear to auscultation.  CARDIAC:  Exam reveals regular rate and rhythm.  ABDOMEN:  Positive bowel sounds.  Abdomen soft.  She has mild epigastric  tenderness to deep palpation.  No organomegaly or masses.  No rebound,  tenderness or guarding.  No abdominal bruits or hernias.  EXTREMITIES:  No edema.   LABORATORY DATA:  White count 14,700, hemoglobin 12.3, hematocrit 35.9,  platelets 291,000.  Sed rate 49.  Sodium 138, potassium 4.2, BUN 7,  creatinine 0.83, glucose 150, total bilirubin 1, alkaline phosphatase  131, AST 31, ALT 25, albumin 3.3, amylase 28, lipase 11.  Blood cultures  negative x2.  Varicella zoster titers are pending.  Abdominal series  yesterday revealed prominent ascending colon stool.   IMPRESSION:  The patient is a 60 year old female with suspected diffuse  Varicella zoster who has had postprandial epigastric pain for 2-3 weeks  associated with nausea and also constipation.  No other gastrointestinal  symptoms.  Differential includes possibility of gastroesophageal reflux  disease, PUD,  biliary disease.  Constipation may be contributing to her  abdominal pain.  Doubt that she has herpes esophagitis, without  dysphagia or odynophagia.   RECOMMENDATIONS:  1. Continue proton pump inhibitor (PPI) therapy.  2. Will add MiraLax.  3. Consider abdominal ultrasound to rule out biliary etiology.  4. Recheck LFTs.  5. EGD once acute illness has resolved.      Tana Coast, P.A.      Kassie Mends, M.D.  Electronically Signed    LL/MEDQ  D:  01/13/2007  T:  01/13/2007  Job:  [id number]

## 2011-01-30 NOTE — Op Note (Signed)
NAME:  Madison Todd, Madison Todd NO.:  [id number]   MEDICAL RECORD NO.:  [id number]          PATIENT TYPE:  AMB   LOCATION:  DSC                          FACILITY:  [facility]   PHYSICIAN:  Leonie Man, M.D.   DATE OF BIRTH:  10/04/1950   DATE OF PROCEDURE:  04/22/2006  DATE OF DISCHARGE:                                 OPERATIVE REPORT   PREOPERATIVE DIAGNOSIS:  Carcinoma left breast.   POSTOPERATIVE DIAGNOSIS:  Carcinoma left breast.   PROCEDURE:  Partial mastectomy left breast with left sentinel lymph node  biopsy.   SURGEON:  Leonie Man, M.D.   ASSISTANT:  OR tech.   ANESTHESIA:  General.   NOTE:  Madison Todd is a 60 year old female who, on screening mammogram,  was noted to have a spiculated lesion in the central portion of the left  breast. On ultrasound and core biopsy, this shows an invasive carcinoma.  The patient comes to the operating room now for partial mastectomy and  sentinel lymph node biopsy with the possibility of axillary lymph node  dissection.  The risks and potential benefits of surgery have been fully  discussed with her, all questions answered, and consent obtained.   PROCEDURE:  Prior to the patient coming to the operating room, technetium  radio nucleotide dye is injected in the periareolar area and following the  induction of satisfactory general anesthesia, a solution of methylene blue  and saline was injected in the periareolar area and massaged into the  lymphatics.  The breast was then prepped and draped to be included in a  sterile operative field and with the use of the NeoProbe, an area in the  left axilla was identified as the most likely area of the sentinel lymph  node location.  A transverse incision is carried down through the breast and  deepened through subcutaneous tissues raising flaps superiorly and  inferiorly taking a large wedge of breast tissue.  This was carried all the  way down to the chest wall.  This tissue  was removed in its entirety and  forwarded for specimen mammography.  On specimen mammography as well as  touch prep, it seemed that the 9 o'clock margin was somewhat close to the  tumor.  I, therefore, went back and took several additional centimeters of  tissue in the 9 o'clock to 6 o'clock region and labeled this and forwarded  for pathologic evaluation.  With the use of the NeoProbe, we entered into  the axilla where a dissection was carried down up into the axillary content  where a hot blue node was identified, removed in its entirety, and forwarded  for pathologic evaluation.  Touch preps on the node were negative for  carcinoma.  There were no additional nodes noted within the axilla.  All  areas of dissection within the axilla and the breast were then checked for  hemostasis and noted be dry.  Sponge and instrument counts were verified.  The axillary wound was closed in layers with interrupted 2-0 Vicryl sutures  and 3-0 Vicryl suture.  The skin was closed with 5-0 Monocryl.  Similarly,  the breast tissue was reapproximated  using 2-0 Vicryl suture, the  subcutaneous tissues closed with 3-0 Vicryl, and the skin closed with 5-0  Monocryl sutures.  All wounds were then reinforced with Steri-Strips.  Sterile dressings were applied.  Anesthetic was reversed.  The patient was  moved from the operating room to the recovery room in stable condition.  She  tolerated the procedure well.      Leonie Man, M.D.  Electronically Signed     PB/MEDQ  D:  04/22/2006  T:  04/22/2006  Job:  [id number]

## 2011-01-30 NOTE — Procedures (Signed)
NAME:  Madison Todd, Madison Todd        ACCOUNT NO.:  [id number]   MEDICAL RECORD NO.:  [id number]          PATIENT TYPE:  REC   LOCATION:  RESP                          FACILITY:  [facility]   PHYSICIAN:  Edward L. Juanetta Gosling, M.D.DATE OF BIRTH:  Oct 11, 1950   DATE OF PROCEDURE:  DATE OF DISCHARGE:                            PULMONARY FUNCTION TEST   1. Spirometry shows a mild ventilatory defect without airflow      obstruction.  2. Lung volumes are normal.  3. DLCO is normal.  4. Arterial blood gases are normal.  5. There is no significant bronchodilator effect.      Edward L. Juanetta Gosling, M.D.  Electronically Signed     ELH/MEDQ  D:  10/30/2006  T:  10/30/2006  Job:  [id number]   cc:   Ladona Horns. Mariel Sleet, MD  Fax: [phone]

## 2011-01-30 NOTE — Procedures (Signed)
NAME:  Madison Todd, Madison Todd NO.:  [id number]   MEDICAL RECORD NO.:  [id number]          PATIENT TYPE:  REC   LOCATION:                                FACILITY:  [facility]   PHYSICIAN:  Dani Gobble, MD       DATE OF BIRTH:  10-08-1950   DATE OF PROCEDURE:  DATE OF DISCHARGE:  09/16/2006                                ECHOCARDIOGRAM   INDICATIONS:  60 year old female with breast cancer referred for  evaluation of LV function prior to chemotherapy.   The technical quality of the study is reasonable.   The aorta measures normally at 3 cm.   Left atrium measures normally at 3.7 cm.  The patient appeared to be in  sinus rhythm during this procedure.   The interventricular septum and posterior wall are within normal limits  except for basal septal hypertrophy.   The aortic valve is probably trileaflet, although the left coronary cusp  is not well visualized.  There is mild thickening without limitation to  leaflet excursion.  No aortic insufficiency is noted.  Doppler  interrogation of the aortic valve is within normal limits.   The mitral valve appears mildly thickened without limitation to leaflet  excursion.  No mitral valve prolapse noted.  Mild mitral regurgitation  is noted.  Doppler interrogation of mitral valve is within normal  limits.   The pulmonic valve is incompletely visualized but appeared to be grossly  structurally normal with trivial pulmonic insufficiency noted.   The tricuspid valve also appeared grossly structurally normal with  trivial tricuspid regurgitation noted.   The left ventricle is normal in size with the LV IDD measured at 4.5 cm  and the LV IC measured 3.8 cm.  Overall left systolic function is  diminished with an estimated ejection fraction of 30-40%.  There is  moderate global hypokinesis.   The right atrium and right ventricle are normal in size and right  ventricular systolic function may be just mildly diminished.  No  pericardial  effusion is appreciated.   IMPRESSION:  1. Mild basal septal hypertrophy.  2. Mild aortic sclerosis without stenosis.  3. Mild mitral regurgitation.  4. Normal left ventricular size with diminished ejection fraction      estimated at 30-40% with moderate global hypokinesis.           ______________________________  Dani Gobble, MD     AB/MEDQ  D:  09/16/2006  T:  09/16/2006  Job:  [id number]   cc:   Ladona Horns. Mariel Sleet, MD  Fax: [phone]

## 2011-01-30 NOTE — Consult Note (Signed)
NAME:  Madison Todd, Madison Todd NO.:  [id number]   MEDICAL RECORD NO.:  [id number]          PATIENT TYPE:  INP   LOCATION:  IC08                          FACILITY:  [facility]   PHYSICIAN:  Edward L. Juanetta Gosling, M.D.DATE OF BIRTH:  1951/07/14   DATE OF CONSULTATION:  DATE OF DISCHARGE:                                 CONSULTATION   REFERRING SERVICE:  [facility].   REASON FOR CONSULTATION:  Respiratory failure.   HISTORY:  Madison Todd is a 60 year old African American female who  presented to the hospital with abdominal pain and fever and a rash.  She  had been seen and given a shot for her abdominal pain.  She had a number  of tests done that were negative.  She was actually scheduled for EGD.  When she was brought for her EGD, she was found a significant rash and  fever and was brought to the emergency room for evaluation.  The rash  had apparently started about 2 weeks ago now, maybe longer than that,  started on her feet and progressed over her arms and legs and there are  capsules. Since then, she has developed more shortness of breath  yesterday, then became extremely short of breath with respiratory  distress and was brought to the ICU and intubated and placed on  mechanical ventilation.   PAST MEDICAL HISTORY:  Positive for breast cancer, COPD and  hypertension.   PAST SURGICAL HISTORY:  She had a lumpectomy and mastectomy.   SOCIAL HISTORY:  She does not use any alcohol.  She has about a 20- to  30-pack-year smoking history and should be noted that this history is  all from her chart, as she is intubated and unable to provide any  history.  She has been diagnosed with a disseminated varicella.  She  also is noted to have skin lesions that look more like a vasculitic  change.  She had been improving and getting ready for discharge, but  then had the acute change.   PHYSICAL EXAM:  GENERAL:  Exam shows that she is sedated, but still  moving around.  VITAL  SIGNS:  Pulse is in the 80s, sinus rhythm with PACs, blood  pressure 120/70, respirations 12.  HEENT:  Her pupils are reactive.  Her mucous membranes are dry.  Her  nose and throat are clear.  CHEST:  Shows rhonchi bilaterally.  HEART:  Regular.  I do not hear a gallop.  ABDOMEN:  Soft.  No masses are felt.  EXTREMITIES:  Showed no edema.  SKIN:  She has a rash which is dark, blistered with either black eschar  over it or blood.  She also has another rash which is diffuse, more  petechial in nature.  CNS:  Grossly intact.   LABORATORY WORK:  Liver functions show alkaline phosphatase of 213, SGOT  of 147, SGPT of 210; these have increased since the 7th.  Varicella  zoster on the 2nd was 2.29; on the 8th, 1.43.  Blood gas this morning on  40%, 600, rate of 14 and 5 of PEEP, pH 7.52, pCO2 of 35, PO2 of 74.  Cardiac panel:  Troponin is 0.16, MB 4.7 with CKs only 55.  BMET:  Glucose 229, BUN of 12, creatinine 0.63.  White count 17,400, hemoglobin  9.4, platelets 463,000, so it certainly is not related to  thrombocytopenia, but she does have a left shift and is said to have  metamyelocytes, myelocytes and bands seen on her CBC.   CT of chest:  No pulmonary embolism, right pleural effusion, left  pleural effusion, bilateral lower lobe consolidation, patchy opacity in  the lingula, interstitial prominence, enlarged heart, hemangioma in the  majority of the T11 vertebral body, mastectomy changes and a Port-A-Cath  in.   CT of abdomen and pelvis:  Pericholecystic edema with no gallbladder  wall thickening or gallstones, mild sludge in the gallbladder,  periportal edema.  Upper abdominal lymph nodes are mildly prominent,  retroperitoneal lymph nodes mildly prominent.  Pelvis negative.   IMPRESSION:  She has respiratory failure, probably multifactorial.  She  certainly could have pneumonia now; that is certainly what seems to be  on x-ray, but with disseminated varicella, she could certainly  have  lesions of varicella as well.  She has abnormalities in her liver that  are worse than they were on admission.  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  She is on prednisone.  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  She was seen by the dermatologist, felt to have varicella from the  dermatologist.  The other eruption was felt to possibly be a drug  eruption.  She did have a biopsy made of the skin.  She has been seen by  Dr. Mariel Sleet, who is her oncologist.  At this point, I think it is  reasonable to try to go ahead and reculture her, continue with her  treatment.  She is currently on Diflucan, Lovenox, Lasix, Xopenex,  vancomycin and insulin.  I will cut her ventilator rate slightly, since  she is alkalotic, continue with everything else, get a respiratory  culture I do not think she is going to be weanable today.   Thank you for allowing me to see her with you.      Edward L. Juanetta Gosling, M.D.  Electronically Signed     ELH/MEDQ  D:  01/23/2007  T:  01/24/2007  Job:  [id number]

## 2011-01-30 NOTE — Op Note (Signed)
NAME:  Madison Todd, Madison Todd        ACCOUNT NO.:  [id number]   MEDICAL RECORD NO.:  [id number]          PATIENT TYPE:  AMB   LOCATION:  DAY                           FACILITY:  [facility]   PHYSICIAN:  R. Roetta Sessions, M.D. DATE OF BIRTH:  1951-01-11   DATE OF PROCEDURE:  07/20/2005  DATE OF DISCHARGE:                                 OPERATIVE REPORT   PROCEDURE:  Screening colonoscopy/colonoscopy with biopsy.   ENDOSCOPIST:  Gerrit Friends. Rourk, M.D.   INDICATIONS FOR PROCEDURE:  The patient is a 60 year old lady referred at  the courtesy of Dr. Mariel Sleet for colorectal cancer screening.  She has  never had her lower GI tract imaged.  There is no family history of  colorectal neoplasia. She is devoid of any lower GI tract symptoms, however,  she does have a personal history of breast cancer status post right  mastectomy.  Colonoscopy is now being done as a high-risk screening  maneuver.  This approach has been discussed with the patient at length; the  potential risks, benefits, and alternatives have been reviewed; questions  answered. She is agreeable.  Please see the documentation in the medical  record.   PROCEDURE NOTE:  O2 saturation, blood pressure, pulse and respirations were  monitored throughout the entire procedure.   CONSCIOUS SEDATION:  Versed 3 mg IV, Demerol 75 mg IV in divided doses.   INSTRUMENT:  Olympus video chip system.   FINDINGS:  Digital rectal exam revealed no abnormalities.   ENDOSCOPIC FINDINGS:  The prep was good.   RECTUM:  Examination of the rectal mucosa including the retroflex view of  the anal verge revealed no abnormalities. There was some blood tinged mucous  and focal irritation of the distal rectal mucosa consistent with enema  tipped trauma.  Otherwise the rectal mucosa appeared normal.   COLON:  The colonic mucosa was surveyed from the rectosigmoid junction  through the left transverse, right colon to the area of the appendiceal  orifice,  ileocecal valve, and cecum.  These structures were well seen and  photographed for the record.   From this level the scope was slowly withdrawn.  All previously mentioned  mucosal surfaces were again seen.  The patient had a 3-mm polyp between two  folds in the mid ascending colon, otherwise the colonic mucosa appeared  normal.  This polyp was removed with cold biopsy forceps technique.  The  patient tolerated the procedure well and was reacted in endoscopy.   IMPRESSION:  1.  Diminutive ascending colon polyp, status post resection as described      above, otherwise normal colonic mucosa.  2.  Normal-appearing rectal mucosa -- see above.   RECOMMENDATIONS:  1.  Followup on path.  2.  Further recommendations to follow.      Jonathon Bellows, M.D.  Electronically Signed     RMR/MEDQ  D:  07/20/2005  T:  07/20/2005  Job:  [id number]   cc:   Ladona Horns. Mariel Sleet, MD  Fax: [phone]   Annia Friendly. Loleta Chance, MD  Fax: [phone]

## 2011-01-30 NOTE — Op Note (Signed)
NAME:  CHRISMA, HURLOCK NO.:  [id number]   MEDICAL RECORD NO.:  [id number]          PATIENT TYPE:  AMB   LOCATION:  DSC                          FACILITY:  [facility]   PHYSICIAN:  Leonie Man, M.D.   DATE OF BIRTH:  [birth year]   DATE OF PROCEDURE:  05/26/2006  DATE OF DISCHARGE:                                 OPERATIVE REPORT   PREOPERATIVE DIAGNOSES:  1. Carcinoma of breast bilaterally.  2. Poor venous access.   POSTOPERATIVE DIAGNOSES:  1. Carcinoma of breast bilaterally.  2. Poor venous access.   PROCEDURE:  Port-A-Cath implantation.   SURGEON:  Leonie Man, M.D.   ASSISTANT:  OR nurse.   ANESTHESIA:  MAC.   SPECIMENS:  There were no specimens sent to the lab.   ESTIMATED BLOOD LOSS:  Minimal.   COMPLICATIONS:  There were no complications.   DISPOSITION:  The patient returned to the PACU in excellent condition.   NOTE:  Miss Shurtz is a 60 year old female with metachronous bilateral  breast cancers.  She is status post recent partial mastectomy and axillary  sentinel lymph node biopsy.  She returns to the operating room now for  implantation of venous access device so as to facilitate her chemotherapy.  The patient understands the risks and potential benefits of surgery.  She  gives consent to the same.   With the patient positioned in the Trendelenburg position, after the  anterior neck and chest were prepped and draped to be included in the  sterile operative field, I infiltrate the right subclavian region with 1%  lidocaine placed.  A subclavian stick is made into the right subclavian vein  and a guide wire is threaded into the superior vena cava under fluoroscopic  guidance.  I then create a pocket on the anterior chest wall by infiltrating  the anterior chest wall with 0.5% Marcaine with epinephrine.  A transverse  incision is made on the chest wall and a pocket is created by dissecting  down, removing the subcutaneous fat down to  the pectoralis fascia.  I then  created a tunnel from the pocket up to the shoulder site, through which a  Silastic catheter for the Port-A-Cath was pulled through.  A dilator and  introducer were placed over the guide wire and then passed into the central  venous system, using a Seldinger technique type technique.  The position of  the dilator and introducer was confirmed on fluoroscopy.  The dilator and  introducer were then removed, as well as the guide wire, and the Port-A-Cath  was threaded into the central venous system and positioned with its tip at  the atrial-vena caval junction.  Inflow of heparinized saline and blood  return were noted to be excellent.  The Port-A-Cath reservoir was then  attached to the external portion of the Silastic catheter at the pocket and  secured.  Inflow of heparinized saline and blood return were again noted to  be excellent.  The Port-A-Cath was then placed into the pocket and sutured  in place with 2-0 silk sutures.  All areas of dissection were checked for  hemostasis and noted to be  dry.  Sponge and instrument counts were verified.  Both wounds were closed in 2 layers using interrupted 3-0 Vicryl and 4-0  Monocryl sutures.  The wounds were then reinforced with Steri-Strips.  The  Port-A-Cath reservoir was then flushed with concentrated heparin solution.  A sterile dressing was applied and the anesthetic reversed.  The patient was  removed from the operating room to the recovery room in stable condition.  She tolerated the procedure well.      Leonie Man, M.D.  Electronically Signed     PB/MEDQ  D:  05/26/2006  T:  05/26/2006  Job:  [id number]   cc:   Ladona Horns. Mariel Sleet, MD  Leonie Man, MD

## 2011-01-30 NOTE — H&P (Signed)
NAME:  Madison Todd, Madison Todd NO.:  [id number]   MEDICAL RECORD NO.:  [id number]          PATIENT TYPE:  INP   LOCATION:  A328                          FACILITY:  [facility]   PHYSICIAN:  Skeet Latch, DO    DATE OF BIRTH:  Jan 25, 1951   DATE OF ADMISSION:  01/10/2007  DATE OF DISCHARGE:  LH                              HISTORY & PHYSICAL   PRIMARY CARE PHYSICIAN:  Dr. Loleta Chance.   CHIEF COMPLAINT:  Epigastric pain and rash.   HISTORY OF PRESENT ILLNESS:  Madison Todd is a 60 year old African  American female who presents complaining of abdominal pain and fever.  The patient states that she began to have abdominal pain and a rash that  started this past Thursday.  The patient was seen by her primary care  physician this past weekend, was given a pain shot for her abdominal  pain.  Labs were drawn and a KUB was done, which were all negative.  The  patient was referred for an EGD which was supposed to be done today.  Upon getting her EGD, she was found to have a significant rash and  fever, so this was cancelled.  The patient was sent to the ER for  evaluation.   The patient states that she had a rash that started on her feet this  past Thursday, and it has, over the last few days, progressed over her  arms and legs.  The patient presented to the emergency room with this  pustular rash, not very pruritic at this time, but it is spreading.  The  patient states on my exam that her abdominal pain is gone, and she is  feeling fairly well at this moment.   PAST MEDICAL HISTORY:  1. Breast cancer.  2. COPD.  3. Hypertension.   PAST SURGICAL HISTORY:  1. Lumpectomy.  2. Mastectomy.   SOCIAL HISTORY:  Denies drinking, any drug use, but was a one-pack per  day for over 20 years smoker.  She just recently quit.   CURRENT MEDICATIONS:  1. __________ 120 mg once a day.  2. Vicodin 5/500 as needed.  3. Potassium chloride 20 mEq once a day.  4. Aromasin 25 mg once a day.  5.  Furosemide 40 mg once a day.  6. Asendin 81 mg a day.  7. Nexium 40 mg daily.   DRUG ALLERGIES:  NO KNOWN DRUG ALLERGIES.   REVIEW OF SYSTEMS:  SKIN:  Positive pustular rash.  HEENT:  Negative.  RESPIRATORY:  Denies any cough, wheezing, dyspnea.  GI:  Has some  epigastric pain.  Denies nausea, vomiting, diarrhea.  MUSCULOSKELETAL:  No arthralgias, back, neck pain.  NEUROLOGIC:  No headache, blurry  vision.  RHEUMATOLOGIC:  No anemia.   PHYSICAL EXAMINATION:  GENERAL:  The patient is awake, alert, and  oriented x3.  No acute distress.  VITAL SIGNS:  Temperature 98.8, pulse 89, respirations 20, blood  pressure is 103/66.  The patient is sating 93% on room air.  HEENT:  Head is atraumatic, normocephalic.  No scleral icterus.  No JVD  or thyromegaly.  No lymphadenopathy noted.  LUNGS:  Clear to  auscultation bilaterally.  No rales, rhonchi, or  wheezing.  CARDIOVASCULAR:  S1-S2.  No rubs, gallops, murmurs.  ABDOMEN:  No palpable epigastric pain on palpation and no rigidity, or  guarding.  Positive bowel sounds.  EXTREMITIES:  No clubbing, cyanosis, or edema.  SKIN:  Diffuse pustular rash on bilateral feet, legs, arms.  No  appreciated rash on face, back, abdomen, or oral mucosa.   LABORATORY:  White blood cell count 14.8, hemoglobin 13.4, hematocrit  39.7, platelets 255, neutrophils 85, lymphocytes 8.  Sed rate is 49.  Sodium 135, potassium 3.7, chloride 101, CO2 24, glucose 132, BUN 8,  creatinine 0.67.  AST 31, ALT 25, amylase 28, lipase 11.  Urine:  Negative glucose, moderate bilirubin, moderate blood, negative for  nitrites, small amount of leukocytes, 11-20 white blood cells, 7-10 red  blood cells, rare bacteria.   The patient did have a MUGA scan done on January 05, 2007, which showed  progressive global hypokinesis, with calculated ejection fraction  approximately 32% compared with 41% previously.  No focal wall motion  abnormalities.  She did have a x-ray done on January 08, 2007, which  showed nonspecific, nonobstructive bowel gas pattern.   ASSESSMENT:  1. Epigastric pain.  2. Pustule rash.  3. Breast cancer.  4. Hypertension.  5. Chronic obstructive pulmonary disease.   PLAN:  1. The patient be admitted to a general medical bed.  2. The patient was given Rocephin and Bactrim in the emergency room.      This will be continued.  3. Unsure if this rash is related to medications or a contact type of      rash.  4. ER has done an RPR and blood cultures.  We will await the results      of these tests.  5. Probably, we will get a dermatological consult to evaluate rash.  6. For abdominal pain, the patient was due for an EGD today.  We will      hold until the rash is not as progressive.  I do not feel the EGD      is critical at this time, but the patient will need an EGD in the      near future.  7. The patient will be kept on GI prophylaxis as well as DVT      prophylaxis.  8. For breast cancer, the patient was in-house and had an appointment      with Dr. Mariel Sleet.  We will consult Dr.      Mariel Sleet  to follow the patient while she stays in the hospital at      this time.  9. Will get a repeat CBC, BMP in the a.m.  10.The patient will be followed closely.      Skeet Latch, DO  Electronically Signed     SM/MEDQ  D:  01/10/2007  T:  01/10/2007  Job:  [id number]   cc:   Ladona Horns. Mariel Sleet, MD  Fax: [phone]   Annia Friendly. Loleta Chance, MD  Fax: [phone]

## 2011-06-23 LAB — CBC
HCT: 41.5
MCV: 88.1
Platelets: 308
WBC: 9.9

## 2011-06-23 LAB — COMPREHENSIVE METABOLIC PANEL
AST: 24
Albumin: 3.7
BUN: 5 — ABNORMAL LOW
CO2: 28
Calcium: 9.5
Chloride: 102
Creatinine, Ser: 0.65
GFR calc Af Amer: >60
GFR calc non Af Amer: >60
Total Bilirubin: 0.5

## 2011-06-23 LAB — DIFFERENTIAL
Eosinophils Absolute: 0.1 — ABNORMAL LOW
Eosinophils Relative: 1
Lymphs Abs: 3
Monocytes Relative: 7

## 2011-06-30 LAB — COMPREHENSIVE METABOLIC PANEL
ALT: 18
Alkaline Phosphatase: 162 — ABNORMAL HIGH
BUN: 4 — ABNORMAL LOW
CO2: 28
Chloride: 103
GFR calc non Af Amer: >60
Glucose, Bld: 231 — ABNORMAL HIGH
Potassium: 3.2 — ABNORMAL LOW
Sodium: 137
Total Bilirubin: 0.5

## 2011-06-30 LAB — CBC
HCT: 38.5
Hemoglobin: 12.8
RBC: 4.4

## 2011-06-30 LAB — DIFFERENTIAL
Basophils Absolute: 0
Basophils Relative: 0
Eosinophils Absolute: 0.1
Monocytes Relative: 5
Neutro Abs: 5.2
Neutrophils Relative %: 66

## 2011-07-21 ENCOUNTER — Encounter (HOSPITAL_COMMUNITY): Payer: Medicare Other | Attending: Oncology

## 2011-07-21 DIAGNOSIS — C50919 Malignant neoplasm of unspecified site of unspecified female breast: Secondary | ICD-10-CM

## 2011-07-21 DIAGNOSIS — J4489 Other specified chronic obstructive pulmonary disease: Secondary | ICD-10-CM | POA: Insufficient documentation

## 2011-07-21 DIAGNOSIS — M310 Hypersensitivity angiitis: Secondary | ICD-10-CM | POA: Insufficient documentation

## 2011-07-21 DIAGNOSIS — Z853 Personal history of malignant neoplasm of breast: Secondary | ICD-10-CM | POA: Insufficient documentation

## 2011-07-21 DIAGNOSIS — J449 Chronic obstructive pulmonary disease, unspecified: Secondary | ICD-10-CM | POA: Insufficient documentation

## 2011-07-21 DIAGNOSIS — Z09 Encounter for follow-up examination after completed treatment for conditions other than malignant neoplasm: Secondary | ICD-10-CM | POA: Insufficient documentation

## 2011-07-21 DIAGNOSIS — I2589 Other forms of chronic ischemic heart disease: Secondary | ICD-10-CM | POA: Insufficient documentation

## 2011-07-21 LAB — CBC
Hemoglobin: 15.2 g/dL — ABNORMAL HIGH (ref 12.0–15.0)
MCH: 30.2 pg (ref 26.0–34.0)
Platelets: 268 10*3/uL (ref 150–400)
RBC: 5.03 MIL/uL (ref 3.87–5.11)
WBC: 9.2 10*3/uL (ref 4.0–10.5)

## 2011-07-21 LAB — COMPREHENSIVE METABOLIC PANEL
ALT: 17 U/L (ref 0–35)
AST: 22 U/L (ref 0–37)
Alkaline Phosphatase: 147 U/L — ABNORMAL HIGH (ref 39–117)
CO2: 27 mEq/L (ref 19–32)
Calcium: 9.7 mg/dL (ref 8.4–10.5)
GFR calc Af Amer: >90 mL/min (ref >90)
Potassium: 3.1 mEq/L — ABNORMAL LOW (ref 3.5–5.1)
Sodium: 134 mEq/L — ABNORMAL LOW (ref 135–145)
Total Bilirubin: 0.3 mg/dL (ref 0.3–1.2)

## 2011-07-21 LAB — DIFFERENTIAL
Basophils Relative: 0 % (ref 0–1)
Monocytes Relative: 6 % (ref 3–12)
Neutro Abs: 5.4 10*3/uL (ref 1.7–7.7)
Neutrophils Relative %: 59 % (ref 43–77)

## 2011-07-21 NOTE — Progress Notes (Signed)
Labs drawn today for cbc/diff.cmp 

## 2011-07-22 ENCOUNTER — Encounter (HOSPITAL_COMMUNITY): Payer: Self-pay | Admitting: Oncology

## 2011-07-22 ENCOUNTER — Encounter (HOSPITAL_BASED_OUTPATIENT_CLINIC_OR_DEPARTMENT_OTHER): Payer: Medicare Other | Admitting: Oncology

## 2011-07-22 DIAGNOSIS — C50919 Malignant neoplasm of unspecified site of unspecified female breast: Secondary | ICD-10-CM

## 2011-07-22 DIAGNOSIS — J449 Chronic obstructive pulmonary disease, unspecified: Secondary | ICD-10-CM | POA: Insufficient documentation

## 2011-07-22 DIAGNOSIS — I429 Cardiomyopathy, unspecified: Secondary | ICD-10-CM | POA: Insufficient documentation

## 2011-07-22 DIAGNOSIS — C50912 Malignant neoplasm of unspecified site of left female breast: Secondary | ICD-10-CM

## 2011-07-22 DIAGNOSIS — M310 Hypersensitivity angiitis: Secondary | ICD-10-CM | POA: Insufficient documentation

## 2011-07-22 HISTORY — DX: Chronic obstructive pulmonary disease, unspecified: J44.9

## 2011-07-22 HISTORY — DX: Malignant neoplasm of unspecified site of unspecified female breast: C50.919

## 2011-07-22 HISTORY — DX: Malignant neoplasm of unspecified site of left female breast: C50.912

## 2011-07-22 NOTE — Patient Instructions (Signed)
[facility]  Discharge Instructions  RECOMMENDATIONS MADE BY THE CONSULTANT AND ANY TEST RESULTS WILL BE SENT TO YOUR REFERRING DOCTOR.   EXAM FINDINGS BY MD TODAY AND SIGNS AND SYMPTOMS TO REPORT TO CLINIC OR PRIMARY MD: you are doing well.  Report any new lumps, bone pain or shortness of breath.  MEDICATIONS PRESCRIBED:  Increase your potassium to 10 mEq three times daily for 14 days, then two times daily for 14 days then daily.     INSTRUCTIONS GIVEN AND DISCUSSED: Need your potassium level checked in 1 month  SPECIAL INSTRUCTIONS/FOLLOW-UP: Lab work Needed 1year and Return to Clinic on 1 year to see Dr. Mariel Sleet   I acknowledge that I have been informed and understand all the instructions given to me and received a copy. I do not have any more questions at this time, but understand that I may call the [facility] at [facility] at [phone] during business hours should I have any further questions or need assistance in obtaining follow-up care.    __________________________________________  _____________  __________ Signature of Patient or Authorized Representative            Date                   Time    __________________________________________ Nurse's Signature

## 2011-07-22 NOTE — Progress Notes (Signed)
Madison Courier, MD [address] [place] [place] [address]  1. Left-sided Breast cancer   2. Leukocytoclastic vasculitis   3. Cardiomyopathy   4. COPD (chronic obstructive pulmonary disease)     INTERVAL HISTORY: Madison Todd 60 y.o. female returns for  regular  visit for followup of Left sided breast cancer: Dx in August 2007.  Stage II (T2 N0).  ER 73%, PR 90%, Her2 positive, Ki-67 19%.  2.1 cm in size.  Agreed to only have radiation, Herceptin, and Aromasin.  Right sided breast cancer: Stage II (T2 N0 M0).  2.5 cm in size.  Surgery on 04/28/1996 with axillary dissection on 05/10/1996.  11 negative nodes.  Treated with AC x 4 cycles for ER negative, PR positive at only 20%.   The patient reports that she has been having some right sided neck pain that radiates to her right deltoid area of arm.  She is seeing her PCP about this problem.  He prescribed her some tramadol for this problem.  She relates that lying down makes her pain worse.  The tramadol helps with the discomfort.  She explains that she believes it is related to stress and taking care of her aunt.  Otherwise, the patient denies any complaints.  She continues to smoke tobacco.  Patient is due to have her next mammogram this month.  Past Medical History  Diagnosis Date  . Leukocytoclastic vasculitis   . COPD (chronic obstructive pulmonary disease)   . Diabetes mellitus   . Breast cancer     left 2007/right 1997  . Left-sided Breast cancer 07/22/2011  . 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 Cardiomyopathy 07/22/2011  . COPD (chronic obstructive pulmonary disease) 07/22/2011    has Right and Left-sided Breast cancer; Leukocytoclastic vasculitis; Cardiomyopathy; and COPD (chronic obstructive pulmonary disease) on her problem list.     is allergic to lisinopril and aromasin.  [name] does not currently have medications on file.  Past Surgical History  Procedure Date  . Mastectomy, radical    right  . Breast lumpectomy     left  . Port-a-cath removal     Denies any headaches, dizziness, double vision, fevers, chills, night sweats, nausea, vomiting, diarrhea, constipation, chest pain, heart palpitations, shortness of breath, blood in stool, black tarry stool, urinary pain, urinary burning, urinary frequency, hematuria.   PHYSICAL EXAMINATION  ECOG PERFORMANCE STATUS: 1 - Symptomatic but completely ambulatory  Filed Vitals:   07/22/11 1327  BP: 128/85  Pulse: 85  Temp: 98.1 F (36.7 C)    GENERAL:alert, no distress, well nourished, well developed, comfortable, cooperative and smiling SKIN: skin color, texture, turgor are normal HEAD: Normocephalic EYES: normal EARS: External ears normal OROPHARYNX:mucous membranes are moist  NECK: supple, no adenopathy, no bruits, thyroid normal size, non-tender, without nodularity, no stridor, non-tender, trachea midline LYMPH:  no palpable lymphadenopathy, no hepatosplenomegaly BREAST:left breast normal without mass, skin or nipple changes or axillary nodes but lumpy in nature. Right post-mastectomy site well healed and free of suspicious changes LUNGS: clear to auscultation and percussion, decreased breath sounds throughout HEART: regular rate & rhythm, no murmurs, no gallops, S1 normal and S2 normal ABDOMEN:abdomen soft, non-tender, obese, normal bowel sounds and no hepatosplenomegaly BACK: Back symmetric, no curvature., right sided trapezius tightness EXTREMITIES:less then 2 second capillary refill, no joint deformities, effusion, or inflammation, no edema, no skin discoloration, no clubbing, no cyanosis  NEURO: alert & oriented x 3 with fluent speech, no focal motor/sensory deficits, gait normal  LABORATORY DATA: CBC    Component Value Date/Time   WBC 9.2 07/21/2011 1154   RBC 5.03 07/21/2011 1154   HGB 15.2* 07/21/2011 1154   HCT 45.2 07/21/2011 1154   PLT 268 07/21/2011 1154   MCV 89.9 07/21/2011 1154   MCH 30.2 07/21/2011  1154   MCHC 33.6 07/21/2011 1154   RDW 14.3 07/21/2011 1154   LYMPHSABS 3.2 07/21/2011 1154   MONOABS 0.5 07/21/2011 1154   EOSABS 0.1 07/21/2011 1154   BASOSABS 0.0 07/21/2011 1154      Chemistry      Component Value Date/Time   NA 134* 07/21/2011 1154   K 3.1* 07/21/2011 1154   CL 99 07/21/2011 1154   CO2 27 07/21/2011 1154   BUN 7 07/21/2011 1154   CREATININE 0.64 07/21/2011 1154      Component Value Date/Time   CALCIUM 9.7 07/21/2011 1154   ALKPHOS 147* 07/21/2011 1154   AST 22 07/21/2011 1154   ALT 17 07/21/2011 1154   BILITOT 0.3 07/21/2011 1154        ASSESSMENT:  1. H/O of right and left sided breast cancer on separate occassions.  No evidence of recurrence.   PLAN:  1. Increase KCl 10 mEq to TID x 14 days.  Then KCl 10 mEq BID x 14 days.  Then back to 10 mEq daily. 2. BMET in one month which will be done by her PCP in December.  We have asked for her to ask for the results to be faxed to [place]. 3. Mammogram this month or next month 4. Lab work in 1 yr: CBC diff, CMET 5. Return in 1 year for follow-up   All questions were answered. The patient knows to call the clinic with any problems, questions or concerns. We can certainly see the patient much sooner if necessary.  The patient and plan discussed with Glenford Peers, MD and he is in agreement with the aforementioned.  I spent 20 minutes counseling the patient face to face. The total time spent in the appointment was 25 minutes.  [name]

## 2011-07-24 ENCOUNTER — Other Ambulatory Visit (HOSPITAL_COMMUNITY): Payer: Self-pay | Admitting: Oncology

## 2011-07-24 DIAGNOSIS — Z139 Encounter for screening, unspecified: Secondary | ICD-10-CM

## 2011-07-24 DIAGNOSIS — C50919 Malignant neoplasm of unspecified site of unspecified female breast: Secondary | ICD-10-CM

## 2011-08-12 ENCOUNTER — Ambulatory Visit (HOSPITAL_COMMUNITY)
Admission: RE | Admit: 2011-08-12 | Discharge: 2011-08-12 | Disposition: A | Discharge status: Home / Self Care | Payer: Medicare Other | Source: Ambulatory Visit | Attending: Oncology | Admitting: Oncology

## 2011-08-12 DIAGNOSIS — C50919 Malignant neoplasm of unspecified site of unspecified female breast: Secondary | ICD-10-CM

## 2011-08-12 DIAGNOSIS — Z853 Personal history of malignant neoplasm of breast: Secondary | ICD-10-CM | POA: Insufficient documentation

## 2012-06-13 ENCOUNTER — Other Ambulatory Visit (HOSPITAL_COMMUNITY): Payer: Self-pay | Admitting: Oncology

## 2012-07-18 ENCOUNTER — Other Ambulatory Visit (HOSPITAL_COMMUNITY): Payer: Medicare Other

## 2012-07-22 ENCOUNTER — Ambulatory Visit (HOSPITAL_COMMUNITY): Payer: Medicare Other | Admitting: Oncology

## 2012-08-16 ENCOUNTER — Encounter (HOSPITAL_COMMUNITY): Payer: Self-pay

## 2012-12-01 ENCOUNTER — Other Ambulatory Visit (HOSPITAL_COMMUNITY): Payer: Self-pay | Admitting: Oncology

## 2012-12-14 ENCOUNTER — Ambulatory Visit (HOSPITAL_COMMUNITY)
Admission: RE | Admit: 2012-12-14 | Discharge: 2012-12-14 | Disposition: A | Discharge status: Home / Self Care | Payer: Medicare Other | Source: Ambulatory Visit | Attending: Oncology | Admitting: Oncology

## 2012-12-14 DIAGNOSIS — Z853 Personal history of malignant neoplasm of breast: Secondary | ICD-10-CM | POA: Insufficient documentation

## 2012-12-14 DIAGNOSIS — C50912 Malignant neoplasm of unspecified site of left female breast: Secondary | ICD-10-CM

## 2012-12-19 ENCOUNTER — Encounter (HOSPITAL_COMMUNITY): Payer: Self-pay | Admitting: Oncology

## 2012-12-19 ENCOUNTER — Encounter (HOSPITAL_COMMUNITY): Payer: Medicare Other | Attending: Oncology | Admitting: Oncology

## 2012-12-19 VITALS — BP 151/82 | HR 78 | Temp 98.1°F | Resp 18 | Wt 162.4 lb

## 2012-12-19 DIAGNOSIS — C50919 Malignant neoplasm of unspecified site of unspecified female breast: Secondary | ICD-10-CM

## 2012-12-19 DIAGNOSIS — M25519 Pain in unspecified shoulder: Secondary | ICD-10-CM

## 2012-12-19 DIAGNOSIS — Z170 Estrogen receptor positive status [ER+]: Secondary | ICD-10-CM

## 2012-12-19 DIAGNOSIS — M25511 Pain in right shoulder: Secondary | ICD-10-CM

## 2012-12-19 DIAGNOSIS — C50911 Malignant neoplasm of unspecified site of right female breast: Secondary | ICD-10-CM

## 2012-12-19 DIAGNOSIS — Z853 Personal history of malignant neoplasm of breast: Secondary | ICD-10-CM

## 2012-12-19 MED ORDER — PREDNISONE 5 MG PO TABS: ORAL_TABLET | ORAL | Status: DC

## 2012-12-19 MED ORDER — HYDROCODONE-ACETAMINOPHEN 7.5-300 MG PO TABS: 1.0000 | ORAL_TABLET | Freq: Four times a day (QID) | ORAL | Status: DC

## 2012-12-19 NOTE — Progress Notes (Signed)
Diagnosis #1 left-sided breast cancer diagnosed in August 2007, stage II, ER 73%, PR 90%, HER-2 positive, Ki-67 marker 19% (T2 N0) 2.1 cm in size. She refused chemotherapy but agreed to radiation therapy, Herceptin, and Aromasin. She is taking Aromasin for 5 years. She took Herceptin for a year.  #2 right sided breast cancer, stage II (T2 N0) 2.5 cm in size with a right mastectomy on 04/28/1996 with axillary dissection on 05/10/1996. 11 nodes were negative. She was treated with AC x4 cycles for ER negative, PR +20%, cancer. Thus far she has remained free of disease from both cancers.  #3 right shoulder pain acute onset over the weekend after working in her yard on Friday afternoon picking up heavy tree limbs, etc. the pain did not start until Saturday but she has very very limited range of motion of the right shoulder, tenderness to palpation of the right shoulder, possible swelling of the soft tissues, but tremendous pain. She cannot sleep well at night. She has severe pain at 90 of abduction and minimal internal and external rotation. I will place her on steroids 30 mg a day for 3 days, 25 mg a day for 3 days, 20 mg a day for 3 days, and hydrocodone 7.5/325 APAP, #30, no refills. She promises to see her orthopedic surgeon if she is not better by Friday of this week. I am worried that she has rotator cuff damage.  She is still taking care of her on who has severe dementia. Her I cannot remember my name even though she was my patient for many years.  Madison Todd is not smoking and is supposedly taking care of her diabetes mellitus. She denies shortness of breath or chest pain. She is now wearing lumps or bumps anywhere. She states her bowels are working well.  Her vital signs show that her weight is stable for the last year and a half. She has not been here since November 2012. Pulses 80 and regular. Her lungs are clear to auscultation and percussion. The right shoulder has tremendously reduced range of  motion. The right chest wall shows the old keloid scar but no nodularity. The left breast is negative for any masses. Heart shows a regular rhythm and rate without distinct S3 gallop or murmur. Abdomen is soft and nontender without hepatosplenomegaly. She has no leg edema. I think there is puffiness of the right arm.  Overall she looks good but her shoulders quite an issue for her. We'll see if the steroids in the pain medication help but I suspect she needs to see an orthopedic surgeon but does not want to do that right away.  She promises to see one of she's not better by Friday. Otherwise she only agrees to come back and see [place] in one year. She states that she had her blood work today by Dr. Loleta Chance her primary care physician.

## 2012-12-19 NOTE — Patient Instructions (Addendum)
[facility] Discharge Instructions  RECOMMENDATIONS MADE BY THE CONSULTANT AND ANY TEST RESULTS WILL BE SENT TO YOUR REFERRING PHYSICIAN.  A prescription has been sent to your pharmacy for prednisone. Take it as directed. You were given a prescription for Hydrocodone. If your shoulder is not better in 1 week you really should see Dr.Harrison.  Return to clinic in 1 year to see MD. Report any issues/concerns to clinic as needed.   Thank you for choosing [facility] to provide your oncology and hematology care.  To afford each patient quality time with our providers, please arrive at least 15 minutes before your scheduled appointment time.  With your help, our goal is to use those 15 minutes to complete the necessary work-up to ensure our physicians have the information they need to help with your evaluation and healthcare recommendations.    Effective January 1st, 2014, we ask that you re-schedule your appointment with our physicians should you arrive 10 or more minutes late for your appointment.  We strive to give you quality time with our providers, and arriving late affects you and other patients whose appointments are after yours.    Again, thank you for choosing [facility].  Our hope is that these requests will decrease the amount of time that you wait before being seen by our physicians.       _____________________________________________________________  Should you have questions after your visit to [facility], please contact our office at [phone] between the hours of 8:30 a.m. and 5:00 p.m.  Voicemails left after 4:30 p.m. will not be returned until the following business day.  For prescription refill requests, have your pharmacy contact our office with your prescription refill request.

## 2013-06-22 ENCOUNTER — Other Ambulatory Visit (HOSPITAL_COMMUNITY)
Admission: RE | Admit: 2013-06-22 | Discharge: 2013-06-22 | Disposition: A | Discharge status: Home / Self Care | Payer: Medicare Other | Source: Ambulatory Visit | Attending: Family Medicine | Admitting: Family Medicine

## 2013-06-22 ENCOUNTER — Other Ambulatory Visit: Payer: Self-pay | Admitting: Family Medicine

## 2013-06-22 DIAGNOSIS — Z1151 Encounter for screening for human papillomavirus (HPV): Secondary | ICD-10-CM | POA: Insufficient documentation

## 2013-06-22 DIAGNOSIS — Z124 Encounter for screening for malignant neoplasm of cervix: Secondary | ICD-10-CM | POA: Insufficient documentation

## 2013-07-31 ENCOUNTER — Telehealth: Payer: Self-pay

## 2013-07-31 NOTE — Telephone Encounter (Signed)
Pt was referred by Dr. Mirna Mires for screenng colonoscopy. Her last one was 11/6/206 by [facility]. I have requested path from [facility] medical records. Pt is not having any problems, she thought it had been 10 years. I will call her back when I have the path report.

## 2013-08-01 NOTE — Telephone Encounter (Signed)
Per Alcario Drought, the info has been mailed to me.

## 2013-08-09 NOTE — Telephone Encounter (Signed)
Pt had her last colonoscopy in 07/20/2005 by Dr. Jena Gauss. She had an inflamed adenomatous polyp. OV with Gerrit Halls, NP on 10/02/2013 at 2:30 PM.

## 2013-10-02 ENCOUNTER — Encounter (INDEPENDENT_AMBULATORY_CARE_PROVIDER_SITE_OTHER): Payer: Self-pay

## 2013-10-02 ENCOUNTER — Encounter: Payer: Self-pay | Admitting: Gastroenterology

## 2013-10-02 ENCOUNTER — Ambulatory Visit (INDEPENDENT_AMBULATORY_CARE_PROVIDER_SITE_OTHER): Payer: Medicare HMO | Admitting: Gastroenterology

## 2013-10-02 VITALS — BP 126/83 | HR 96 | Temp 97.3°F | Ht 65.0 in | Wt 160.0 lb

## 2013-10-02 DIAGNOSIS — D3A026 Benign carcinoid tumor of the rectum: Secondary | ICD-10-CM

## 2013-10-02 DIAGNOSIS — D369 Benign neoplasm, unspecified site: Secondary | ICD-10-CM

## 2013-10-02 MED ORDER — PEG 3350-KCL-NA BICARB-NACL 420 G PO SOLR: 4000.0000 mL | ORAL | Status: DC

## 2013-10-02 NOTE — Progress Notes (Signed)
cc'd to pcp 

## 2013-10-02 NOTE — Patient Instructions (Signed)
We have scheduled you for a colonoscopy in the near future with Dr. Rourk.  Further recommendations to follow.   

## 2013-10-02 NOTE — Progress Notes (Signed)
Primary Care Physician:  Maggie Font, MD Primary Gastroenterologist:  Dr. Gala Romney  Chief Complaint  Patient presents with  . Colonoscopy    HPI:   Madison Todd is a very pleasant 63 year old female with history of adenomatous polyp in 2006; she is overdue for surveillance at this time. She presents today to schedule a colonoscopy. She tells me that she knew she needed earlier follow-up; however, she was wanting to put this off.  No rectal bleeding. No abdominal pain. Denies constipation, diarrhea. No changes in appetite, weight loss. No N/V. No reflux symptoms. No dysphagia.   Past Medical History  Diagnosis Date  . Leukocytoclastic vasculitis   . Diabetes mellitus   . Breast cancer     left 2007/right 1997  . Left-sided Breast cancer 07/22/2011  . 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 Cardiomyopathy 07/22/2011  . COPD (chronic obstructive pulmonary disease) 07/22/2011    Past Surgical History  Procedure Laterality Date  . Mastectomy, radical      right  . Breast lumpectomy      left  . Port-a-cath removal    . Colonoscopy  07/20/2005    RMR: Diminutive ascending colon polyp, status post resection as described above, otherwise normal colonic mucosa/Normal-appearing rectal mucosa -- see above    Current Outpatient Prescriptions  Medication Sig Dispense Refill  . acetaminophen (TYLENOL) 325 MG tablet Take 650 mg by mouth every 6 (six) hours as needed for pain.      Marland Kitchen aspirin 81 MG tablet Take 81 mg by mouth daily.        . diclofenac sodium (VOLTAREN) 1 % GEL Apply 2 g topically as needed.      . metFORMIN (GLUCOPHAGE-XR) 500 MG 24 hr tablet Take 500 mg by mouth daily with breakfast.        . metoprolol (TOPROL-XL) 50 MG 24 hr tablet Take 50 mg by mouth daily.        . Olmesartan-Amlodipine-HCTZ (TRIBENZOR) 40-5-12.5 MG TABS Take 1 tablet by mouth daily.        . potassium chloride (KLOR-CON M10) 10 MEQ tablet       . rosuvastatin (CRESTOR) 5 MG tablet  Take 5 mg by mouth at bedtime.      Marland Kitchen Hydrocodone-Acetaminophen (VICODIN ES) 7.5-300 MG TABS Take 1 each by mouth every 6 (six) hours.  30 each  0  . predniSONE (DELTASONE) 5 MG tablet Take 6 a day for 3 days, then 5 a day for 3 days, then 4 a day for 3 days  50 tablet  0   No current facility-administered medications for this visit.    Allergies as of 10/02/2013 - Review Complete 10/02/2013  Allergen Reaction Noted  . Lisinopril Other (See Comments) 07/22/2011  . Aromasin [exemestane] Rash 07/22/2011    Family History  Problem Relation Age of Onset  . Diabetes Father   . Cancer Maternal Aunt   . Cancer Paternal Aunt   . Colon cancer Neg Hx     History   Social History  . Marital Status: Legally Separated    Spouse Name: N/A    Number of Children: 3  . Years of Education: N/A   Occupational History  .     Social History Main Topics  . Smoking status: Current Every Day Smoker -- 0.50 packs/day  . Smokeless tobacco: Never Used     Comment: weaning off of cigarettes. Started in October. Used to smoke a whole pack a day.   Marland Kitchen  Alcohol Use: No  . Drug Use: No  . Sexual Activity: No   Other Topics Concern  . Not on file   Social History Narrative  . No narrative on file    Review of Systems: Gen: Denies any fever, chills, fatigue, weight loss, lack of appetite.  CV: Denies chest pain, heart palpitations, peripheral edema, syncope.  Resp: Denies shortness of breath at rest or with exertion. Denies wheezing or cough.  GI: see HPI GU : Denies urinary burning, urinary frequency, urinary hesitancy MS: Denies joint pain, muscle weakness, cramps, or limitation of movement.  Derm: Denies rash, itching, dry skin Psych: +depression/anxiety, situational Heme: Denies bruising, bleeding, and enlarged lymph nodes.  Physical Exam: BP 126/83  Pulse 96  Temp(Src) 97.3 F (36.3 C) (Oral)  Ht 5' 5" (1.651 m)  Wt 160 lb (72.576 kg)  BMI 26.63 kg/m2 General:   Alert and oriented.  Pleasant and cooperative. Well-nourished and well-developed.  Head:  Normocephalic and atraumatic. Eyes:  Without icterus, sclera clear and conjunctiva pink.  Ears:  Normal auditory acuity. Nose:  No deformity, discharge,  or lesions. Mouth:  No deformity or lesions, oral mucosa pink.  Neck:  Supple, without mass or thyromegaly. Lungs:  Clear to auscultation bilaterally. No wheezes, rales, or rhonchi. No distress.  Heart:  S1, S2 present without murmurs appreciated.  Abdomen:  +BS, soft, non-tender and non-distended. No HSM noted. No guarding or rebound. No masses appreciated.  Rectal:  Deferred  Msk:  Symmetrical without gross deformities. Normal posture. Extremities:  Without clubbing or edema. Neurologic:  Alert and  oriented x4;  grossly normal neurologically. Skin:  Intact without significant lesions or rashes. Cervical Nodes:  No significant cervical adenopathy. Psych:  Alert and cooperative. Normal mood and affect.    

## 2013-10-02 NOTE — Assessment & Plan Note (Signed)
63 year old female with history of adenomatous polyp in 2006, overdue for surveillance colonoscopy now. She has no lower or upper GI symptoms of concern and is agreeable to proceeding with lower GI evaluation now.   Proceed with TCS with Dr. Gala Romney in near future: the risks, benefits, and alternatives have been discussed with the patient in detail. The patient states understanding and desires to proceed.

## 2013-10-05 ENCOUNTER — Encounter (HOSPITAL_COMMUNITY): Payer: Self-pay | Admitting: *Deleted

## 2013-10-05 ENCOUNTER — Ambulatory Visit (HOSPITAL_COMMUNITY)
Admission: RE | Admit: 2013-10-05 | Discharge: 2013-10-05 | Disposition: A | Discharge status: Home / Self Care | Payer: Medicare HMO | Source: Ambulatory Visit | Attending: Internal Medicine | Admitting: Internal Medicine

## 2013-10-05 ENCOUNTER — Encounter (HOSPITAL_COMMUNITY)
Admission: RE | Disposition: A | Discharge status: Home / Self Care | Payer: Self-pay | Source: Ambulatory Visit | Attending: Internal Medicine

## 2013-10-05 DIAGNOSIS — D126 Benign neoplasm of colon, unspecified: Secondary | ICD-10-CM

## 2013-10-05 DIAGNOSIS — J4489 Other specified chronic obstructive pulmonary disease: Secondary | ICD-10-CM | POA: Insufficient documentation

## 2013-10-05 DIAGNOSIS — E119 Type 2 diabetes mellitus without complications: Secondary | ICD-10-CM | POA: Insufficient documentation

## 2013-10-05 DIAGNOSIS — D3A026 Benign carcinoid tumor of the rectum: Secondary | ICD-10-CM | POA: Insufficient documentation

## 2013-10-05 DIAGNOSIS — Z860100 Personal history of colon polyps, unspecified: Secondary | ICD-10-CM | POA: Insufficient documentation

## 2013-10-05 DIAGNOSIS — D369 Benign neoplasm, unspecified site: Secondary | ICD-10-CM

## 2013-10-05 DIAGNOSIS — J449 Chronic obstructive pulmonary disease, unspecified: Secondary | ICD-10-CM | POA: Insufficient documentation

## 2013-10-05 DIAGNOSIS — K648 Other hemorrhoids: Secondary | ICD-10-CM | POA: Insufficient documentation

## 2013-10-05 DIAGNOSIS — Z01812 Encounter for preprocedural laboratory examination: Secondary | ICD-10-CM | POA: Insufficient documentation

## 2013-10-05 HISTORY — PX: COLONOSCOPY: SHX5424

## 2013-10-05 LAB — GLUCOSE, CAPILLARY: Glucose-Capillary: 149 mg/dL — ABNORMAL HIGH (ref 70–99)

## 2013-10-05 SURGERY — COLONOSCOPY
Anesthesia: Moderate Sedation

## 2013-10-05 MED ORDER — MEPERIDINE HCL 100 MG/ML IJ SOLN
INTRAMUSCULAR | Status: DC | PRN
  Administered 2013-10-05: 50 mg via INTRAVENOUS
  Administered 2013-10-05: 25 mg via INTRAVENOUS

## 2013-10-05 MED ORDER — MIDAZOLAM HCL 5 MG/5ML IJ SOLN
INTRAMUSCULAR | Status: AC
  Filled 2013-10-05: qty 10

## 2013-10-05 MED ORDER — MIDAZOLAM HCL 5 MG/5ML IJ SOLN
INTRAMUSCULAR | Status: DC | PRN
Start: 2013-10-05 — End: 2013-10-05
  Administered 2013-10-05: 2 mg via INTRAVENOUS
  Administered 2013-10-05 (×2): 1 mg via INTRAVENOUS

## 2013-10-05 MED ORDER — STERILE WATER FOR IRRIGATION IR SOLN
Status: DC | PRN
  Administered 2013-10-05: 11:00:00

## 2013-10-05 MED ORDER — LIDOCAINE VISCOUS 2 % MT SOLN
OROMUCOSAL | Status: AC
  Filled 2013-10-05: qty 15

## 2013-10-05 MED ORDER — SODIUM CHLORIDE 0.9 % IV SOLN
INTRAVENOUS | Status: DC
  Administered 2013-10-05: 10:00:00 via INTRAVENOUS

## 2013-10-05 MED ORDER — ONDANSETRON HCL 4 MG/2ML IJ SOLN
INTRAMUSCULAR | Status: DC | PRN
  Administered 2013-10-05: 4 mg via INTRAVENOUS

## 2013-10-05 MED ORDER — ONDANSETRON HCL 4 MG/2ML IJ SOLN
INTRAMUSCULAR | Status: AC
  Filled 2013-10-05: qty 2

## 2013-10-05 MED ORDER — MEPERIDINE HCL 100 MG/ML IJ SOLN
INTRAMUSCULAR | Status: AC
  Filled 2013-10-05: qty 2

## 2013-10-05 NOTE — Discharge Instructions (Addendum)
°Colonoscopy °Discharge Instructions ° °Read the instructions outlined below and refer to this sheet in the next few weeks. These discharge instructions provide you with general information on caring for yourself after you leave the hospital. Your doctor may also give you specific instructions. While your treatment has been planned according to the most current medical practices available, unavoidable complications occasionally occur. If you have any problems or questions after discharge, call Dr. Rourk at [phone]. °ACTIVITY °· You may resume your regular activity, but move at a slower pace for the next 24 hours.  °· Take frequent rest periods for the next 24 hours.  °· Walking will help get rid of the air and reduce the bloated feeling in your belly (abdomen).  °· No driving for 24 hours (because of the medicine (anesthesia) used during the test).   °· Do not sign any important legal documents or operate any machinery for 24 hours (because of the anesthesia used during the test).  °NUTRITION °· Drink plenty of fluids.  °· You may resume your normal diet as instructed by your doctor.  °· Begin with a light meal and progress to your normal diet. Heavy or fried foods are harder to digest and may make you feel sick to your stomach (nauseated).  °· Avoid alcoholic beverages for 24 hours or as instructed.  °MEDICATIONS °· You may resume your normal medications unless your doctor tells you otherwise.  °WHAT YOU CAN EXPECT TODAY °· Some feelings of bloating in the abdomen.  °· Passage of more gas than usual.  °· Spotting of blood in your stool or on the toilet paper.  °IF YOU HAD POLYPS REMOVED DURING THE COLONOSCOPY: °· No aspirin products for 7 days or as instructed.  °· No alcohol for 7 days or as instructed.  °· Eat a soft diet for the next 24 hours.  °FINDING OUT THE RESULTS OF YOUR TEST °Not all test results are available during your visit. If your test results are not back during the visit, make an appointment  with your caregiver to find out the results. Do not assume everything is normal if you have not heard from your caregiver or the medical facility. It is important for you to follow up on all of your test results.  °SEEK IMMEDIATE MEDICAL ATTENTION IF: °· You have more than a spotting of blood in your stool.  °· Your belly is swollen (abdominal distention).  °· You are nauseated or vomiting.  °· You have a temperature over 101.  °· You have abdominal pain or discomfort that is severe or gets worse throughout the day.  ° °Polyp information provided ° °Further recommendations to follow pending review of pathology report ° °Colon Polyps °Polyps are lumps of extra tissue growing inside the body. Polyps can grow in the large intestine (colon). Most colon polyps are noncancerous (benign). However, some colon polyps can become cancerous over time. Polyps that are larger than a pea may be harmful. To be safe, caregivers remove and test all polyps. °CAUSES  °Polyps form when mutations in the genes cause your cells to grow and divide even though no more tissue is needed. °RISK FACTORS °There are a number of risk factors that can increase your chances of getting colon polyps. They include: °· Being older than 50 years. °· Family history of colon polyps or colon cancer. °· Long-term colon diseases, such as colitis or Crohn disease. °· Being overweight. °· Smoking. °· Being inactive. °· Drinking too much alcohol. °SYMPTOMS  °  Most small polyps do not cause symptoms. If symptoms are present, they may include: °· Blood in the stool. The stool may look dark red or black. °· Constipation or diarrhea that lasts longer than 1 week. °DIAGNOSIS °People often do not know they have polyps until their caregiver finds them during a regular checkup. Your caregiver can use 4 tests to check for polyps: °· Digital rectal exam. The caregiver wears gloves and feels inside the rectum. This test would find polyps only in the rectum. °· Barium enema.  The caregiver puts a liquid called barium into your rectum before taking X-rays of your colon. Barium makes your colon look white. Polyps are dark, so they are easy to see in the X-ray pictures. °· Sigmoidoscopy. A thin, flexible tube (sigmoidoscope) is placed into your rectum. The sigmoidoscope has a light and tiny camera in it. The caregiver uses the sigmoidoscope to look at the last third of your colon. °· Colonoscopy. This test is like sigmoidoscopy, but the caregiver looks at the entire colon. This is the most common method for finding and removing polyps. °TREATMENT  °Any polyps will be removed during a sigmoidoscopy or colonoscopy. The polyps are then tested for cancer. °PREVENTION  °To help lower your risk of getting more colon polyps: °· Eat plenty of fruits and vegetables. Avoid eating fatty foods. °· Do not smoke. °· Avoid drinking alcohol. °· Exercise every day. °· Lose weight if recommended by your caregiver. °· Eat plenty of calcium and folate. Foods that are rich in calcium include milk, cheese, and broccoli. Foods that are rich in folate include chickpeas, kidney beans, and spinach. °HOME CARE INSTRUCTIONS °Keep all follow-up appointments as directed by your caregiver. You may need periodic exams to check for polyps. °SEEK MEDICAL CARE IF: °You notice bleeding during a bowel movement. °Document Released: 05/27/2004 Document Revised: 11/23/2011 Document Reviewed: 11/10/2011 °[employer]® Patient Information ©2014 [employer]. ° °

## 2013-10-05 NOTE — H&P (View-Only) (Signed)
Primary Care Physician:  Maggie Font, MD Primary Gastroenterologist:  Dr. Gala Romney  Chief Complaint  Patient presents with  . Colonoscopy    HPI:   Madison Todd is a very pleasant 63 year old female with history of adenomatous polyp in 2006; she is overdue for surveillance at this time. She presents today to schedule a colonoscopy. She tells me that she knew she needed earlier follow-up; however, she was wanting to put this off.  No rectal bleeding. No abdominal pain. Denies constipation, diarrhea. No changes in appetite, weight loss. No N/V. No reflux symptoms. No dysphagia.   Past Medical History  Diagnosis Date  . Leukocytoclastic vasculitis   . Diabetes mellitus   . Breast cancer     left 2007/right 1997  . Left-sided Breast cancer 07/22/2011  . 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 Cardiomyopathy 07/22/2011  . COPD (chronic obstructive pulmonary disease) 07/22/2011    Past Surgical History  Procedure Laterality Date  . Mastectomy, radical      right  . Breast lumpectomy      left  . Port-a-cath removal    . Colonoscopy  07/20/2005    RMR: Diminutive ascending colon polyp, status post resection as described above, otherwise normal colonic mucosa/Normal-appearing rectal mucosa -- see above    Current Outpatient Prescriptions  Medication Sig Dispense Refill  . acetaminophen (TYLENOL) 325 MG tablet Take 650 mg by mouth every 6 (six) hours as needed for pain.      Marland Kitchen aspirin 81 MG tablet Take 81 mg by mouth daily.        . diclofenac sodium (VOLTAREN) 1 % GEL Apply 2 g topically as needed.      . metFORMIN (GLUCOPHAGE-XR) 500 MG 24 hr tablet Take 500 mg by mouth daily with breakfast.        . metoprolol (TOPROL-XL) 50 MG 24 hr tablet Take 50 mg by mouth daily.        . Olmesartan-Amlodipine-HCTZ (TRIBENZOR) 40-5-12.5 MG TABS Take 1 tablet by mouth daily.        . potassium chloride (KLOR-CON M10) 10 MEQ tablet       . rosuvastatin (CRESTOR) 5 MG tablet  Take 5 mg by mouth at bedtime.      Marland Kitchen Hydrocodone-Acetaminophen (VICODIN ES) 7.5-300 MG TABS Take 1 each by mouth every 6 (six) hours.  30 each  0  . predniSONE (DELTASONE) 5 MG tablet Take 6 a day for 3 days, then 5 a day for 3 days, then 4 a day for 3 days  50 tablet  0   No current facility-administered medications for this visit.    Allergies as of 10/02/2013 - Review Complete 10/02/2013  Allergen Reaction Noted  . Lisinopril Other (See Comments) 07/22/2011  . Aromasin [exemestane] Rash 07/22/2011    Family History  Problem Relation Age of Onset  . Diabetes Father   . Cancer Maternal Aunt   . Cancer Paternal Aunt   . Colon cancer Neg Hx     History   Social History  . Marital Status: Legally Separated    Spouse Name: N/A    Number of Children: 3  . Years of Education: N/A   Occupational History  .     Social History Main Topics  . Smoking status: Current Every Day Smoker -- 0.50 packs/day  . Smokeless tobacco: Never Used     Comment: weaning off of cigarettes. Started in October. Used to smoke a whole pack a day.   Marland Kitchen  Alcohol Use: No  . Drug Use: No  . Sexual Activity: No   Other Topics Concern  . Not on file   Social History Narrative  . No narrative on file    Review of Systems: Gen: Denies any fever, chills, fatigue, weight loss, lack of appetite.  CV: Denies chest pain, heart palpitations, peripheral edema, syncope.  Resp: Denies shortness of breath at rest or with exertion. Denies wheezing or cough.  GI: see HPI GU : Denies urinary burning, urinary frequency, urinary hesitancy MS: Denies joint pain, muscle weakness, cramps, or limitation of movement.  Derm: Denies rash, itching, dry skin Psych: +depression/anxiety, situational Heme: Denies bruising, bleeding, and enlarged lymph nodes.  Physical Exam: BP 126/83  Pulse 96  Temp(Src) 97.3 F (36.3 C) (Oral)  Ht 5\' 5"  (1.651 m)  Wt 160 lb (72.576 kg)  BMI 26.63 kg/m2 General:   Alert and oriented.  Pleasant and cooperative. Well-nourished and well-developed.  Head:  Normocephalic and atraumatic. Eyes:  Without icterus, sclera clear and conjunctiva pink.  Ears:  Normal auditory acuity. Nose:  No deformity, discharge,  or lesions. Mouth:  No deformity or lesions, oral mucosa pink.  Neck:  Supple, without mass or thyromegaly. Lungs:  Clear to auscultation bilaterally. No wheezes, rales, or rhonchi. No distress.  Heart:  S1, S2 present without murmurs appreciated.  Abdomen:  +BS, soft, non-tender and non-distended. No HSM noted. No guarding or rebound. No masses appreciated.  Rectal:  Deferred  Msk:  Symmetrical without gross deformities. Normal posture. Extremities:  Without clubbing or edema. Neurologic:  Alert and  oriented x4;  grossly normal neurologically. Skin:  Intact without significant lesions or rashes. Cervical Nodes:  No significant cervical adenopathy. Psych:  Alert and cooperative. Normal mood and affect.

## 2013-10-05 NOTE — Interval H&P Note (Signed)
History and Physical Interval Note:  10/05/2013 10:39 AM  Madison Todd  has presented today for surgery, with the diagnosis of COLON POLYPS  The various methods of treatment have been discussed with the patient and family. After consideration of risks, benefits and other options for treatment, the patient has consented to  Procedure(s) with comments: COLONOSCOPY (N/A) - 1:45-moved to [place] notiified pt as a surgical intervention .  The patient's history has been reviewed, patient examined, no change in status, stable for surgery.  I have reviewed the patient's chart and labs.  Questions were answered to the patient's satisfaction.     No change. Colonoscopy per plan.The risks, benefits, limitations, alternatives and imponderables have been reviewed with the patient. Questions have been answered. All parties are agreeable.    Manus Rudd

## 2013-10-05 NOTE — Op Note (Signed)
[facility] [address] [place], [address]   COLONOSCOPY PROCEDURE REPORT  PATIENT: Madison Todd, Madison Todd  MR#:         [id number] BIRTHDATE: 1950/12/06 , 62  yrs. old GENDER: Female ENDOSCOPIST: R.  Garfield Cornea, MD FACP [facility] REFERRED BY:  Iona Beard, M.D. PROCEDURE DATE:  10/05/2013 PROCEDURE:     Colonoscopy with multiple snare polypectomies, polyp ablation and biopsy  INDICATIONS: History of colonic adenoma-overdue for surveillance examination  INFORMED CONSENT:  The risks, benefits, alternatives and imponderables including but not limited to bleeding, perforation as well as the possibility of a missed lesion have been reviewed.  The potential for biopsy, lesion removal, etc. have also been discussed.  Questions have been answered.  All parties agreeable. Please see the history and physical in the medical record for more information.  MEDICATIONS: Versed 4 mg IV and Demerol 75 mg in divided doses. Zofran 4 mg IV  DESCRIPTION OF PROCEDURE:  After a digital rectal exam was performed, the EC-[id number] ([id number])  colonoscope was advanced from the anus through the rectum and colon to the area of the cecum, ileocecal valve and appendiceal orifice.  The cecum was deeply intubated.  These structures were well-seen and photographed for the record.  From the level of the cecum and ileocecal valve, the scope was slowly and cautiously withdrawn.  The mucosal surfaces were carefully surveyed utilizing scope tip deflection to facilitate fold flattening as needed.  The scope was pulled down into the rectum where a thorough examination including retroflexion was performed.    FINDINGS:  Adequate preparation. Lymphangitic-appearing polypoid lesion-5 mm in dimensions in the rectum at 5 cm from the anal verge; internal hemorrhoids; otherwise, normal appearing rectum. Patient had a single diminutive cecal polyp; the patient had (2) pedunculated 5 mm polyps at the  hepatic flexure (2) adjacent diminutive polyps. The remainder of the colonic mucosa appeared normal.  THERAPEUTIC / DIAGNOSTIC MANEUVERS PERFORMED:  The above-mentioned polyps were hot snare removed, cold biopsy removed, hot snare removed and ablated, respectively.  COMPLICATIONS: None  CECAL WITHDRAWAL TIME:  15 minutes  IMPRESSION:  Multiple rectal and colonic polyps-removed/treated as described above  RECOMMENDATIONS: Followup on pathology.   _______________________________ eSigned:  R. Garfield Cornea, MD FACP [facility] 10/05/2013 11:23 AM   CC:

## 2013-10-09 NOTE — Progress Notes (Signed)
Quick Note:  Per RMR pt needs a flex sig this week and put the rectal u/s on hold for now. ______

## 2013-10-09 NOTE — Addendum Note (Signed)
Addended by: Idamae Schuller on: 10/09/2013 10:28 AM   Modules accepted: Orders

## 2013-10-09 NOTE — Progress Notes (Signed)
Pt is scheduled 10/11/13 at 1:00.  Pt is aware of appt and will come by the office to pick up her instructions.

## 2013-10-10 ENCOUNTER — Encounter (HOSPITAL_COMMUNITY): Payer: Self-pay | Admitting: Internal Medicine

## 2013-10-10 ENCOUNTER — Encounter (HOSPITAL_COMMUNITY): Payer: Self-pay | Admitting: Pharmacy Technician

## 2013-10-11 ENCOUNTER — Encounter (HOSPITAL_COMMUNITY)
Admission: RE | Disposition: A | Discharge status: Home / Self Care | Payer: Self-pay | Source: Ambulatory Visit | Attending: Internal Medicine

## 2013-10-11 ENCOUNTER — Ambulatory Visit (HOSPITAL_COMMUNITY)
Admission: RE | Admit: 2013-10-11 | Discharge: 2013-10-11 | Disposition: A | Discharge status: Home / Self Care | Payer: Medicare HMO | Source: Ambulatory Visit | Attending: Internal Medicine | Admitting: Internal Medicine

## 2013-10-11 ENCOUNTER — Encounter (HOSPITAL_COMMUNITY): Payer: Self-pay | Admitting: *Deleted

## 2013-10-11 DIAGNOSIS — Z860100 Personal history of colon polyps, unspecified: Secondary | ICD-10-CM | POA: Insufficient documentation

## 2013-10-11 DIAGNOSIS — J4489 Other specified chronic obstructive pulmonary disease: Secondary | ICD-10-CM | POA: Insufficient documentation

## 2013-10-11 DIAGNOSIS — D3A026 Benign carcinoid tumor of the rectum: Secondary | ICD-10-CM | POA: Insufficient documentation

## 2013-10-11 DIAGNOSIS — Z853 Personal history of malignant neoplasm of breast: Secondary | ICD-10-CM | POA: Insufficient documentation

## 2013-10-11 DIAGNOSIS — K626 Ulcer of anus and rectum: Secondary | ICD-10-CM

## 2013-10-11 DIAGNOSIS — J449 Chronic obstructive pulmonary disease, unspecified: Secondary | ICD-10-CM | POA: Insufficient documentation

## 2013-10-11 DIAGNOSIS — E119 Type 2 diabetes mellitus without complications: Secondary | ICD-10-CM | POA: Insufficient documentation

## 2013-10-11 HISTORY — PX: FLEXIBLE SIGMOIDOSCOPY: SHX5431

## 2013-10-11 HISTORY — DX: Hyperlipidemia, unspecified: E78.5

## 2013-10-11 HISTORY — DX: Essential (primary) hypertension: I10

## 2013-10-11 SURGERY — SIGMOIDOSCOPY, FLEXIBLE
Anesthesia: Moderate Sedation

## 2013-10-11 NOTE — H&P (View-Only) (Signed)
Primary Care Physician:  Maggie Font, MD Primary Gastroenterologist:  Dr. Gala Romney  Chief Complaint  Patient presents with  . Colonoscopy    HPI:   Madison Todd is a very pleasant 63 year old female with history of adenomatous polyp in 2006; she is overdue for surveillance at this time. She presents today to schedule a colonoscopy. She tells me that she knew she needed earlier follow-up; however, she was wanting to put this off.  No rectal bleeding. No abdominal pain. Denies constipation, diarrhea. No changes in appetite, weight loss. No N/V. No reflux symptoms. No dysphagia.   Past Medical History  Diagnosis Date  . Leukocytoclastic vasculitis   . Diabetes mellitus   . Breast cancer     left 2007/right 1997  . Left-sided Breast cancer 07/22/2011  . 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 Cardiomyopathy 07/22/2011  . COPD (chronic obstructive pulmonary disease) 07/22/2011    Past Surgical History  Procedure Laterality Date  . Mastectomy, radical      right  . Breast lumpectomy      left  . Port-a-cath removal    . Colonoscopy  07/20/2005    RMR: Diminutive ascending colon polyp, status post resection as described above, otherwise normal colonic mucosa/Normal-appearing rectal mucosa -- see above    Current Outpatient Prescriptions  Medication Sig Dispense Refill  . acetaminophen (TYLENOL) 325 MG tablet Take 650 mg by mouth every 6 (six) hours as needed for pain.      Marland Kitchen aspirin 81 MG tablet Take 81 mg by mouth daily.        . diclofenac sodium (VOLTAREN) 1 % GEL Apply 2 g topically as needed.      . metFORMIN (GLUCOPHAGE-XR) 500 MG 24 hr tablet Take 500 mg by mouth daily with breakfast.        . metoprolol (TOPROL-XL) 50 MG 24 hr tablet Take 50 mg by mouth daily.        . Olmesartan-Amlodipine-HCTZ (TRIBENZOR) 40-5-12.5 MG TABS Take 1 tablet by mouth daily.        . potassium chloride (KLOR-CON M10) 10 MEQ tablet       . rosuvastatin (CRESTOR) 5 MG tablet  Take 5 mg by mouth at bedtime.      Marland Kitchen Hydrocodone-Acetaminophen (VICODIN ES) 7.5-300 MG TABS Take 1 each by mouth every 6 (six) hours.  30 each  0  . predniSONE (DELTASONE) 5 MG tablet Take 6 a day for 3 days, then 5 a day for 3 days, then 4 a day for 3 days  50 tablet  0   No current facility-administered medications for this visit.    Allergies as of 10/02/2013 - Review Complete 10/02/2013  Allergen Reaction Noted  . Lisinopril Other (See Comments) 07/22/2011  . Aromasin [exemestane] Rash 07/22/2011    Family History  Problem Relation Age of Onset  . Diabetes Father   . Cancer Maternal Aunt   . Cancer Paternal Aunt   . Colon cancer Neg Hx     History   Social History  . Marital Status: Legally Separated    Spouse Name: N/A    Number of Children: 3  . Years of Education: N/A   Occupational History  .     Social History Main Topics  . Smoking status: Current Every Day Smoker -- 0.50 packs/day  . Smokeless tobacco: Never Used     Comment: weaning off of cigarettes. Started in October. Used to smoke a whole pack a day.   Marland Kitchen  Alcohol Use: No  . Drug Use: No  . Sexual Activity: No   Other Topics Concern  . Not on file   Social History Narrative  . No narrative on file    Review of Systems: Gen: Denies any fever, chills, fatigue, weight loss, lack of appetite.  CV: Denies chest pain, heart palpitations, peripheral edema, syncope.  Resp: Denies shortness of breath at rest or with exertion. Denies wheezing or cough.  GI: see HPI GU : Denies urinary burning, urinary frequency, urinary hesitancy MS: Denies joint pain, muscle weakness, cramps, or limitation of movement.  Derm: Denies rash, itching, dry skin Psych: +depression/anxiety, situational Heme: Denies bruising, bleeding, and enlarged lymph nodes.  Physical Exam: BP 126/83  Pulse 96  Temp(Src) 97.3 F (36.3 C) (Oral)  Ht 5' 5" (1.651 m)  Wt 160 lb (72.576 kg)  BMI 26.63 kg/m2 General:   Alert and oriented.  Pleasant and cooperative. Well-nourished and well-developed.  Head:  Normocephalic and atraumatic. Eyes:  Without icterus, sclera clear and conjunctiva pink.  Ears:  Normal auditory acuity. Nose:  No deformity, discharge,  or lesions. Mouth:  No deformity or lesions, oral mucosa pink.  Neck:  Supple, without mass or thyromegaly. Lungs:  Clear to auscultation bilaterally. No wheezes, rales, or rhonchi. No distress.  Heart:  S1, S2 present without murmurs appreciated.  Abdomen:  +BS, soft, non-tender and non-distended. No HSM noted. No guarding or rebound. No masses appreciated.  Rectal:  Deferred  Msk:  Symmetrical without gross deformities. Normal posture. Extremities:  Without clubbing or edema. Neurologic:  Alert and  oriented x4;  grossly normal neurologically. Skin:  Intact without significant lesions or rashes. Cervical Nodes:  No significant cervical adenopathy. Psych:  Alert and cooperative. Normal mood and affect.    

## 2013-10-11 NOTE — Op Note (Signed)
NAME:  Madison Todd, Madison Todd        ACCOUNT NO.:  [id number]  MEDICAL RECORD NO.:  [id number]  LOCATION:  [facility]                          FACILITY:  [facility]  PHYSICIAN:  R. Garfield Cornea, MD [place]:  November 27, 1950  DATE OF PROCEDURE:  10/11/2013 DATE OF DISCHARGE:  10/11/2013                              OPERATIVE REPORT   PROCEDURE:  Sigmoidoscopy with snare resection.  INDICATIONS FOR PROCEDURE:  A 63 year old lady who underwent colonoscopy on January 22.  She had multiple rectal and colonic polyps removed.  She has a single rectal polyp of 5 cm in from the anal verge.  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  She now returns to have an additional resection and inking of this lesion.  The colonic polyps turned out to be simple adenomas.  This approach was discussed with the patient.  Risks, benefits, limitations, alternatives, imponderables have been reviewed.  I have discussed the case with Dr. Owens Loffler as well.  PROCEDURE IN DETAIL:  The patient was placed in left lateral decubitus position.  O2 saturation, blood pressure, pulse, respirations were monitored throughout the entire procedure.  No conscious sedation given.  INSTRUMENT:  Pentax video chip colonoscope.  FINDINGS:  Digital rectal exam revealed no abnormalities.  Endoscopic findings, prep of the rectum was very good.  I was able to identify the polypectomy site 5 cm in from the anal verge.  This basically amounted to a 3-mm mucosal ulcer.  Please see photos.  There were no other rectal lesions seen.  I grasped the rectal ulcer and gathered it up in the snare loop and resected the ulcer with a good 2 to 3 mm margin to spare around the ulcer.  A good piece of tissue was removed leaving a larger 6 to 7 mm ulcer base.  I did not see any residual polyp tissue.  This specimen was submitted to the pathologist. Subsequently, I tattooed this area with a total of 1 mL of endo  Spot. Please see photos.  The patient tolerated the procedure, was discharged. No complications apparent following this procedure.     IMPRESSION:  Rectal polypectomy site identified, additional resection and tattooing performed.  RECOMMENDATIONS:  Follow up on pathology.  Further recommendations to follow.     Bridgette Habermann, MD [facility]     RMR/MEDQ  D:  10/11/2013  T:  10/11/2013  Job:  [id number]  cc:   Barrie Folk. Berdine Addison, MD Fax: [phone]

## 2013-10-11 NOTE — Discharge Instructions (Signed)
Flexible Sigmoidoscopy °Your caregiver has ordered a flexible sigmoidoscopy. This is an exam to evaluate your lower colon. In this exam your colon is cleansed and a short fiber optic tube is inserted through your rectum and into your colon. The fiber optic scope (endoscope) is a short bundle of enclosed flexible small glass fibers. It transmits light to the area examined and images from that area to your caregiver. You do not have to worry about glass breakage in the endoscope. Discomfort is usually minimal. Sedatives and pain medications are generally not required. This exam helps to detect tumors (lumps), polyps, inflammation (swelling and soreness), and areas of bleeding. It may also be used to take biopsies. These are small pieces of tissue taken to examine under a microscope. °LET YOUR CAREGIVER KNOW ABOUT: °· Allergies. °· Medications taken including herbs, eye drops, over the counter medications, and creams. °· Use of steroids (by mouth or creams). °· Previous problems with anesthetics or novocaine °· Possibility of pregnancy, if this applies. °· History of blood clots (thrombophlebitis). °· History of bleeding or blood problems. °· Previous surgery. °· Other health problems. °BEFORE THE PROCEDURE °Eat normally the night before the exam. Your caregiver may order a mild enema or laxative the night before. No eating or drinking should occur after midnight until the procedure is completed. A rectal suppository or enemas may be given in the morning prior to your procedure. You will be brought to the examination area in a hospital gown. °You should be present 60 minutes prior to your procedure or as directed.  °AFTER THE PROCEDURE  °· There is sometimes a little blood passed with the first bowel movement. Do not be concerned. Because air is often used during the exam, it is not unusual to pass gas and experience abdominal (belly) cramping. Walking or a warm pack on your abdomen may help with this. Do not sleep  with a heating pad as burns can occur. °· You may resume all normal eating and activities. °· Only take over-the-counter or prescription medicines for pain, discomfort, or fever as directed by your caregiver. Do not use aspirin or blood thinners if a biopsy (tissue sample) was taken. Consult your caregiver for medication usage if biopsies were taken. °· Call for your results as instructed by your caregiver. Remember, it is your responsibility to obtain the results of your biopsy. Do not assume everything is fine because you do not hear from your caregiver. °SEEK IMMEDIATE MEDICAL CARE IF: °· An oral temperature above 102° F (38.9° C) develops. °· You pass large blood clots or fill a toilet with blood following the procedure. This may also occur 10 to 14 days following the procedure. It is more likely if a biopsy was taken. °· You develop abdominal pain not relieved with medication or that is getting worse rather than better. °Document Released: 08/28/2000 Document Revised: 11/23/2011 Document Reviewed: 06/10/2005 °[employer]® Patient Information ©2014 [employer]. ° °

## 2013-10-11 NOTE — Interval H&P Note (Signed)
History and Physical Interval Note:  10/11/2013 1:36 PM  Madison Todd  has presented today for surgery, with the diagnosis of RECTAL CARCINOID  The various methods of treatment have been discussed with the patient and family. After consideration of risks, benefits and other options for treatment, the patient has consented to  Procedure(s) with comments: FLEXIBLE SIGMOIDOSCOPY (N/A) - 1:00  as a surgical intervention .  The patient's history has been reviewed, patient examined, no change in status, stable for surgery.  I have reviewed the patient's chart and labs.  Questions were answered to the patient's satisfaction.     Recent colonoscopy revealed multiple rectal and colonic polyps. Colonic polyps removed found to be simple adenomas. Rectal polyp turned to be a carcinoid; tumor down to the cautery line. She is here to look at the rectal polypectomy site and to perform an additional resection and tattooing as appropriate. Please see our H&P and the colonoscopy note done within the past 30 days for more information. The risks benefits alternatives limitations which have been reviewed with the patient.  Manus Rudd

## 2013-10-16 ENCOUNTER — Encounter (HOSPITAL_COMMUNITY): Payer: Self-pay | Admitting: Internal Medicine

## 2013-10-17 NOTE — Progress Notes (Signed)
cc'd to pcp 

## 2013-11-07 ENCOUNTER — Telehealth: Payer: Self-pay | Admitting: Internal Medicine

## 2013-11-07 ENCOUNTER — Ambulatory Visit: Payer: Medicare HMO | Admitting: Gastroenterology

## 2013-11-07 NOTE — Telephone Encounter (Signed)
Noted  

## 2013-11-07 NOTE — Telephone Encounter (Signed)
Pt had OV for today but cancelled due to weather. I offered to [facility] her for 3/3 but she is worried the weather still may be bad and would rather wait until 3/11 instead.

## 2013-11-14 ENCOUNTER — Ambulatory Visit: Payer: Medicare HMO | Admitting: Gastroenterology

## 2013-11-22 ENCOUNTER — Encounter (HOSPITAL_COMMUNITY): Payer: Self-pay | Admitting: Pharmacist

## 2013-11-22 ENCOUNTER — Ambulatory Visit (INDEPENDENT_AMBULATORY_CARE_PROVIDER_SITE_OTHER): Payer: Medicare HMO | Admitting: Gastroenterology

## 2013-11-22 ENCOUNTER — Encounter: Payer: Self-pay | Admitting: Gastroenterology

## 2013-11-22 VITALS — BP 118/76 | HR 81 | Temp 97.2°F | Ht 64.0 in | Wt 160.2 lb

## 2013-11-22 DIAGNOSIS — D3A026 Benign carcinoid tumor of the rectum: Secondary | ICD-10-CM

## 2013-11-22 NOTE — Patient Instructions (Signed)
1. Flexible sigmoidoscopy as scheduled. See separate instructions.

## 2013-11-22 NOTE — Assessment & Plan Note (Signed)
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 She has residual tumor based on latest pathology report. She has been offered additional flexible sigmoidoscopy with conscious sedation for further resection of the polypectomy site.  I have discussed the risks, alternatives, benefits with regards to but not limited to the risk of reaction to medication, bleeding, infection, perforation and the patient is agreeable to proceed. Written consent to be obtained.  We will set her up with financial department given her concerns about bills. Advised her of need to pursue management of carcinoid tumor now to optimize her long-term survival and hopefully have cure by resection.

## 2013-11-22 NOTE — Progress Notes (Signed)
Primary Care Physician: Maggie Font, MD  Primary Gastroenterologist:  Garfield Cornea, MD   Chief Complaint  Patient presents with  . Follow-up    HPI: Madison Todd is a 63 y.o. 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 Initial resection was incomplete so flex sig done on 10/11/13 and again path came back with involved margins. She is being offered another attempt of clearing margins with idea of trying to avoid transanal resection. She is without GI complaints. She is apprehensive of another procedure because of the "bills piling up". She asked if she could put it off for a year or so.   Current Outpatient Prescriptions  Medication Sig Dispense Refill  . acetaminophen (TYLENOL) 325 MG tablet Take 325 mg by mouth at bedtime as needed.       Marland Kitchen aspirin 81 MG tablet Take 81 mg by mouth daily.        . metFORMIN (GLUCOPHAGE-XR) 500 MG 24 hr tablet Take 500 mg by mouth daily with breakfast.        . metoprolol (TOPROL-XL) 50 MG 24 hr tablet Take 50 mg by mouth daily.        . Olmesartan-Amlodipine-HCTZ (TRIBENZOR) 40-5-12.5 MG TABS Take 1 tablet by mouth daily.        . potassium chloride (KLOR-CON M10) 10 MEQ tablet Take 10 mEq by mouth daily.       . rosuvastatin (CRESTOR) 5 MG tablet Take 5 mg by mouth at bedtime as needed.        No current facility-administered medications for this visit.    Allergies as of 11/22/2013 - Review Complete 11/22/2013  Allergen Reaction Noted  . Lisinopril Other (See Comments) 07/22/2011  . Aromasin [exemestane] Rash 07/22/2011   Past Medical History  Diagnosis Date  . Leukocytoclastic vasculitis   . Diabetes mellitus   . Breast cancer     left 2007/right 1997  . Left-sided Breast cancer 07/22/2011  . Cardiomyopathy 07/22/2011  . COPD (chronic obstructive pulmonary disease) 07/22/2011  . Hypertension   . Hyperlipidemia    Past Surgical  History  Procedure Laterality Date  . Mastectomy, radical      right  . Breast lumpectomy      left  . Port-a-cath removal    . Colonoscopy  07/20/2005    RMR: Diminutive ascending colon polyp, status post resection . Inflamed focally adenomatous polyp  . Colonoscopy N/A 10/05/2013    RMR: Multiple rectal and colonic polyps-removed/treated. 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 other polyps tubular adenomas  . Flexible sigmoidoscopy N/A 10/11/2013    RMR: rectal polypectomy site identified, assitional resection and tatooing performed. path came back with residual carcinoid   Family History  Problem Relation Age of Onset  . Diabetes Father   . Cancer Maternal Aunt   . Cancer Paternal Aunt   . Colon cancer Neg Hx    History   Social History  . Marital Status: Legally Separated    Spouse Name: N/A    Number of Children: 3  . Years of Education: N/A   Occupational History  .     Social History Main Topics  . Smoking status: Current Every Day Smoker -- 0.50 packs/day for 18 years    Types: Cigarettes  . Smokeless tobacco: Never Used     Comment: smokes 12 cigarettes daily  . Alcohol Use: No  . Drug Use: No  .  Sexual Activity: No   Other Topics Concern  . None   Social History Narrative  . None    ROS:  General: Negative for anorexia, weight loss, fever, chills, fatigue, weakness. ENT: Negative for hoarseness, difficulty swallowing , nasal congestion. CV: Negative for chest pain, angina, palpitations, dyspnea on exertion, peripheral edema.  Respiratory: Negative for dyspnea at rest, dyspnea on exertion, cough, sputum, wheezing.  GI: See history of present illness. GU:  Negative for dysuria, hematuria, urinary incontinence, urinary frequency, nocturnal urination.  Endo: Negative for unusual weight change.    Physical Examination:   BP 118/76  Pulse 81  Temp(Src) 97.2 F (36.2 C) (Oral)  Ht 5' 4" (1.626 m)  Wt 160 lb 3.2 oz (72.666  kg)  BMI 27.48 kg/m2  General: Well-nourished, well-developed in no acute distress.  Eyes: No icterus. Mouth: Oropharyngeal mucosa moist and pink , no lesions erythema or exudate. Lungs: Clear to auscultation bilaterally.  Heart: Regular rate and rhythm, no murmurs rubs or gallops.  Abdomen: Bowel sounds are normal, nontender, nondistended, no hepatosplenomegaly or masses, no abdominal bruits or hernia , no rebound or guarding.   Extremities: No lower extremity edema. No clubbing or deformities. Neuro: Alert and oriented x 4   Skin: Warm and dry, no jaundice.   Psych: Alert and cooperative, normal mood and affect.      

## 2013-11-23 NOTE — Progress Notes (Signed)
cc'd to pcp 

## 2013-11-24 ENCOUNTER — Ambulatory Visit (HOSPITAL_COMMUNITY)
Admission: RE | Admit: 2013-11-24 | Discharge: 2013-11-24 | Disposition: A | Discharge status: Home / Self Care | Payer: Medicare HMO | Source: Ambulatory Visit | Attending: Internal Medicine | Admitting: Internal Medicine

## 2013-11-24 ENCOUNTER — Encounter (HOSPITAL_COMMUNITY)
Admission: RE | Disposition: A | Discharge status: Home / Self Care | Payer: Self-pay | Source: Ambulatory Visit | Attending: Internal Medicine

## 2013-11-24 ENCOUNTER — Encounter (HOSPITAL_COMMUNITY): Payer: Self-pay | Admitting: *Deleted

## 2013-11-24 DIAGNOSIS — I428 Other cardiomyopathies: Secondary | ICD-10-CM | POA: Insufficient documentation

## 2013-11-24 DIAGNOSIS — Z7982 Long term (current) use of aspirin: Secondary | ICD-10-CM | POA: Insufficient documentation

## 2013-11-24 DIAGNOSIS — D3A026 Benign carcinoid tumor of the rectum: Secondary | ICD-10-CM | POA: Insufficient documentation

## 2013-11-24 DIAGNOSIS — F17200 Nicotine dependence, unspecified, uncomplicated: Secondary | ICD-10-CM | POA: Insufficient documentation

## 2013-11-24 DIAGNOSIS — Z9010 Acquired absence of unspecified breast and nipple: Secondary | ICD-10-CM | POA: Insufficient documentation

## 2013-11-24 DIAGNOSIS — J449 Chronic obstructive pulmonary disease, unspecified: Secondary | ICD-10-CM | POA: Insufficient documentation

## 2013-11-24 DIAGNOSIS — E785 Hyperlipidemia, unspecified: Secondary | ICD-10-CM | POA: Insufficient documentation

## 2013-11-24 DIAGNOSIS — Z860100 Personal history of colon polyps, unspecified: Secondary | ICD-10-CM | POA: Insufficient documentation

## 2013-11-24 DIAGNOSIS — I10 Essential (primary) hypertension: Secondary | ICD-10-CM | POA: Insufficient documentation

## 2013-11-24 DIAGNOSIS — E119 Type 2 diabetes mellitus without complications: Secondary | ICD-10-CM | POA: Insufficient documentation

## 2013-11-24 DIAGNOSIS — M310 Hypersensitivity angiitis: Secondary | ICD-10-CM | POA: Insufficient documentation

## 2013-11-24 DIAGNOSIS — Z853 Personal history of malignant neoplasm of breast: Secondary | ICD-10-CM | POA: Insufficient documentation

## 2013-11-24 DIAGNOSIS — J4489 Other specified chronic obstructive pulmonary disease: Secondary | ICD-10-CM | POA: Insufficient documentation

## 2013-11-24 HISTORY — PX: FLEXIBLE SIGMOIDOSCOPY: SHX5431

## 2013-11-24 LAB — GLUCOSE, CAPILLARY: GLUCOSE-CAPILLARY: 186 mg/dL — AB (ref 70–99)

## 2013-11-24 SURGERY — SIGMOIDOSCOPY, FLEXIBLE
Anesthesia: Moderate Sedation

## 2013-11-24 MED ORDER — ONDANSETRON HCL 4 MG/2ML IJ SOLN
INTRAMUSCULAR | Status: AC
  Filled 2013-11-24: qty 2

## 2013-11-24 MED ORDER — MIDAZOLAM HCL 5 MG/5ML IJ SOLN
INTRAMUSCULAR | Status: AC
  Filled 2013-11-24: qty 10

## 2013-11-24 MED ORDER — STERILE WATER FOR IRRIGATION IR SOLN
Status: DC | PRN
  Administered 2013-11-24: 09:00:00

## 2013-11-24 MED ORDER — ONDANSETRON HCL 4 MG/5ML PO SOLN
ORAL | Status: DC | PRN
  Administered 2013-11-24: 4 mg via ORAL

## 2013-11-24 MED ORDER — MIDAZOLAM HCL 5 MG/5ML IJ SOLN
INTRAMUSCULAR | Status: AC
  Filled 2013-11-24: qty 5

## 2013-11-24 MED ORDER — MIDAZOLAM HCL 5 MG/5ML IJ SOLN
INTRAMUSCULAR | Status: DC | PRN
  Administered 2013-11-24 (×2): 2 mg via INTRAVENOUS

## 2013-11-24 MED ORDER — MEPERIDINE HCL 25 MG/ML IJ SOLN
INTRAMUSCULAR | Status: DC | PRN
  Administered 2013-11-24 (×2): 25 mg via INTRAVENOUS

## 2013-11-24 MED ORDER — MEPERIDINE HCL 100 MG/ML IJ SOLN
INTRAMUSCULAR | Status: AC
  Filled 2013-11-24: qty 2

## 2013-11-24 MED ORDER — MEPERIDINE HCL 100 MG/ML IJ SOLN
INTRAMUSCULAR | Status: AC
  Filled 2013-11-24: qty 1

## 2013-11-24 MED ORDER — SODIUM CHLORIDE 0.9 % IV SOLN: INTRAVENOUS | Status: DC

## 2013-11-24 NOTE — Discharge Instructions (Signed)
°  Sigmoidoscopy Discharge Instructions  Read the instructions outlined below and refer to this sheet in the next few weeks. These discharge instructions provide you with general information on caring for yourself after you leave the hospital. Your doctor may also give you specific instructions. While your treatment has been planned according to the most current medical practices available, unavoidable complications occasionally occur. If you have any problems or questions after discharge, call Dr. Gala Romney at [phone]. ACTIVITY  You may resume your regular activity, but move at a slower pace for the next 24 hours.   Take frequent rest periods for the next 24 hours.   Walking will help get rid of the air and reduce the bloated feeling in your belly (abdomen).   No driving for 24 hours (because of the medicine (anesthesia) used during the test).    Do not sign any important legal documents or operate any machinery for 24 hours (because of the anesthesia used during the test).  NUTRITION  Drink plenty of fluids.   You may resume your normal diet as instructed by your doctor.   Begin with a light meal and progress to your normal diet. Heavy or fried foods are harder to digest and may make you feel sick to your stomach (nauseated).   Avoid alcoholic beverages for 24 hours or as instructed.  MEDICATIONS  You may resume your normal medications unless your doctor tells you otherwise.  WHAT YOU CAN EXPECT TODAY  Some feelings of bloating in the abdomen.   Passage of more gas than usual.   Spotting of blood in your stool or on the toilet paper.  IF YOU HAD POLYPS REMOVED DURING THE COLONOSCOPY:  No aspirin products for 7 days or as instructed.   No alcohol for 7 days or as instructed.   Eat a soft diet for the next 24 hours.  FINDING OUT THE RESULTS OF YOUR TEST Not all test results are available during your visit. If your test results are not back during the visit, make an appointment  with your caregiver to find out the results. Do not assume everything is normal if you have not heard from your caregiver or the medical facility. It is important for you to follow up on all of your test results.  SEEK IMMEDIATE MEDICAL ATTENTION IF:  You have more than a spotting of blood in your stool.   Your belly is swollen (abdominal distention).   You are nauseated or vomiting.   You have a temperature over 101.   You have abdominal pain or discomfort that is severe or gets worse throughout the day.    Take Colace twice daily for the next 7 days to avoid constipation/straining  You may see up to 3 tiny metal clips pass eventually or you may not see EGD. This is normal from your procedure today.  No future MRI until clips gone  Further recommendations to follow pending review of pathology report

## 2013-11-24 NOTE — OR Nursing (Signed)
Patient in for a flexible sigmoidoscopy and stated that she did not have any medication with the last one. Dr. Gala Romney notified and said that it was patient's choice if she wanted sedation and an IV. Patient declined IV and sedation.

## 2013-11-24 NOTE — Interval H&P Note (Signed)
History and Physical Interval Note:  11/24/2013 9:05 AM  Madison Todd  has presented today for surgery, with the diagnosis of RECTAL CARCINOID TUMOR  The various methods of treatment have been discussed with the patient and family. After consideration of risks, benefits and other options for treatment, the patient has consented to  Procedure(s) with comments: FLEXIBLE SIGMOIDOSCOPY (N/A) - 1:00-moved to [place] notified pt as a surgical intervention .  The patient's history has been reviewed, patient examined, no change in status, stable for surgery.  I have reviewed the patient's chart and labs.  Questions were answered to the patient's satisfaction.     No change.Hulan Fray

## 2013-11-24 NOTE — H&P (View-Only) (Signed)
Primary Care Physician: Maggie Font, MD  Primary Gastroenterologist:  Garfield Cornea, MD   Chief Complaint  Patient presents with  . Follow-up    HPI: Madison Todd is a 63 y.o. 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 Initial resection was incomplete so flex sig done on 10/11/13 and again path came back with involved margins. She is being offered another attempt of clearing margins with idea of trying to avoid transanal resection. She is without GI complaints. She is apprehensive of another procedure because of the "bills piling up". She asked if she could put it off for a year or so.   Current Outpatient Prescriptions  Medication Sig Dispense Refill  . acetaminophen (TYLENOL) 325 MG tablet Take 325 mg by mouth at bedtime as needed.       Marland Kitchen aspirin 81 MG tablet Take 81 mg by mouth daily.        . metFORMIN (GLUCOPHAGE-XR) 500 MG 24 hr tablet Take 500 mg by mouth daily with breakfast.        . metoprolol (TOPROL-XL) 50 MG 24 hr tablet Take 50 mg by mouth daily.        . Olmesartan-Amlodipine-HCTZ (TRIBENZOR) 40-5-12.5 MG TABS Take 1 tablet by mouth daily.        . potassium chloride (KLOR-CON M10) 10 MEQ tablet Take 10 mEq by mouth daily.       . rosuvastatin (CRESTOR) 5 MG tablet Take 5 mg by mouth at bedtime as needed.        No current facility-administered medications for this visit.    Allergies as of 11/22/2013 - Review Complete 11/22/2013  Allergen Reaction Noted  . Lisinopril Other (See Comments) 07/22/2011  . Aromasin [exemestane] Rash 07/22/2011   Past Medical History  Diagnosis Date  . Leukocytoclastic vasculitis   . Diabetes mellitus   . Breast cancer     left 2007/right 1997  . Left-sided Breast cancer 07/22/2011  . Cardiomyopathy 07/22/2011  . COPD (chronic obstructive pulmonary disease) 07/22/2011  . Hypertension   . Hyperlipidemia    Past Surgical  History  Procedure Laterality Date  . Mastectomy, radical      right  . Breast lumpectomy      left  . Port-a-cath removal    . Colonoscopy  07/20/2005    RMR: Diminutive ascending colon polyp, status post resection . Inflamed focally adenomatous polyp  . Colonoscopy N/A 10/05/2013    RMR: Multiple rectal and colonic polyps-removed/treated. 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 other polyps tubular adenomas  . Flexible sigmoidoscopy N/A 10/11/2013    RMR: rectal polypectomy site identified, assitional resection and tatooing performed. path came back with residual carcinoid   Family History  Problem Relation Age of Onset  . Diabetes Father   . Cancer Maternal Aunt   . Cancer Paternal Aunt   . Colon cancer Neg Hx    History   Social History  . Marital Status: Legally Separated    Spouse Name: N/A    Number of Children: 3  . Years of Education: N/A   Occupational History  .     Social History Main Topics  . Smoking status: Current Every Day Smoker -- 0.50 packs/day for 18 years    Types: Cigarettes  . Smokeless tobacco: Never Used     Comment: smokes 12 cigarettes daily  . Alcohol Use: No  . Drug Use: No  .  Sexual Activity: No   Other Topics Concern  . None   Social History Narrative  . None    ROS:  General: Negative for anorexia, weight loss, fever, chills, fatigue, weakness. ENT: Negative for hoarseness, difficulty swallowing , nasal congestion. CV: Negative for chest pain, angina, palpitations, dyspnea on exertion, peripheral edema.  Respiratory: Negative for dyspnea at rest, dyspnea on exertion, cough, sputum, wheezing.  GI: See history of present illness. GU:  Negative for dysuria, hematuria, urinary incontinence, urinary frequency, nocturnal urination.  Endo: Negative for unusual weight change.    Physical Examination:   BP 118/76  Pulse 81  Temp(Src) 97.2 F (36.2 C) (Oral)  Ht 5\' 4"  (1.626 m)  Wt 160 lb 3.2 oz (72.666  kg)  BMI 27.48 kg/m2  General: Well-nourished, well-developed in no acute distress.  Eyes: No icterus. Mouth: Oropharyngeal mucosa moist and pink , no lesions erythema or exudate. Lungs: Clear to auscultation bilaterally.  Heart: Regular rate and rhythm, no murmurs rubs or gallops.  Abdomen: Bowel sounds are normal, nontender, nondistended, no hepatosplenomegaly or masses, no abdominal bruits or hernia , no rebound or guarding.   Extremities: No lower extremity edema. No clubbing or deformities. Neuro: Alert and oriented x 4   Skin: Warm and dry, no jaundice.   Psych: Alert and cooperative, normal mood and affect.

## 2013-11-24 NOTE — Op Note (Signed)
NAME:  Madison Todd, Madison Todd        ACCOUNT NO.:  [id number]  MEDICAL RECORD NO.:  [id number]  LOCATION:  [facility]                          FACILITY:  [facility]  PHYSICIAN:  R. Garfield Cornea, MD FACP FACGDATE OF BIRTH:  1951-07-18  DATE OF PROCEDURE:  11/24/2013 DATE OF DISCHARGE:                              OPERATIVE REPORT   Sigmoidoscopy with mucosal resection of prior polypectomy site and clipping.  INDICATIONS FOR PROCEDURE:  A 63 year old lady was found to have carcinoid tumor and small polyp at prior colonoscopy.  Followup sigmoidoscopy with additional resection of the polypectomy site demonstrated a small amount of carcinoid down to the cautery margin, now returns for deeper resection.  We had a lengthy discussion previously in the office and again today at the bedside regarding deeper resection to get all the carcinoid out.  If I am unable to do so, she will need a transanal resection.  Questions answered.  All parties agreeable.  PROCEDURE NOTE:  O2 saturation, blood pressure, pulse, respirations were monitored throughout the entirety of the procedure.  CONSCIOUS SEDATION:  Versed 3 mg IV and Demerol 75 mg IV in divided doses.  INSTRUMENT:  Diagnostic gastroscope.  FINDINGS:  Digital rectal exam revealed no abnormalities.  ENDOSCOPIC FINDINGS:  Prep was good in the rectum, readily identified the prior polypectomy site was tattooed in at 5 cm from anal verge no other lesions were seen.  Initially, exam was being carried as a non- sedation exam; however, I was able to see and work best from the retroflexed position much better, and we were in that position for an extended period of time, the patient became uncomfortable.  We interrupted the procedure for IV placement and conscious sedation.  Dr. Laural Golden was present for the procedure as well.  I initially attempted to suction the area of abnormality into the scope over a cap; this really did not work well because the  snare got in the way of the mucosa coming up in the cap.  Subsequently, I decided to load an esophageal banding unit to suction the mucosa up into the cap and band it and then come back with a snare.  This is what occurred.  I loaded up a 7 shot Microvasive bander and went back and suctioned the mucosa up into the cap and placed 2 bands.  This brought up the abnormal area mucosa very nicely.  Scope was withdrawn.  Band was then deployed. The device was removed.  I went back with the snare and resected this area of mucosa nicely.  The defect created was immediately closed nicely with [place] instinct clips.  The specimen was recovered from the pathologist.  The patient overall tolerated the procedure very well, was taken to the recovery room in a stable condition.  IMPRESSION:  Sigmoidoscopy limited to the rectum status post  band ligation assisted mucosal resection of prior polypectomy site status post closure of this defect with 3 hemostasis clips.  RECOMMENDATIONS: 1. Colace 100 mg orally twice daily for the next 7 days to avoid     constipation. 2. No MRI until clips are passed. 3. Follow up on pathology.     Bridgette Habermann, MD Rosalita Chessman [facility]  RMR/MEDQ  D:  11/24/2013  T:  11/24/2013  Job:  [id number]  cc:   Barrie Folk. Berdine Addison, MD Fax: [phone]

## 2013-11-29 ENCOUNTER — Encounter (HOSPITAL_COMMUNITY): Payer: Self-pay | Admitting: Internal Medicine

## 2013-12-03 ENCOUNTER — Encounter: Payer: Self-pay | Admitting: Internal Medicine

## 2013-12-16 ENCOUNTER — Encounter (HOSPITAL_COMMUNITY): Payer: Self-pay | Admitting: Oncology

## 2013-12-16 DIAGNOSIS — C50911 Malignant neoplasm of unspecified site of right female breast: Secondary | ICD-10-CM | POA: Insufficient documentation

## 2013-12-16 HISTORY — DX: Malignant neoplasm of unspecified site of right female breast: C50.911

## 2013-12-16 NOTE — Progress Notes (Signed)
Maggie Font, MD [place] [id number]  Right and Left-sided Breast cancer  Breast cancer, right breast  Carcinoid tumor of rectum  CURRENT THERAPY: Surveillance per NCCN guidelines  INTERVAL HISTORY: Madison Todd 63 y.o. female returns for  regular  visit for followup of left-sided breast cancer diagnosed in August 2007, stage II, ER 73%, PR 90%, HER-2 positive, Ki-67 marker 19% (T2 N0) 2.1 cm in size. She refused chemotherapy but agreed to radiation therapy, Herceptin, and Aromasin. She is taking Aromasin for 5 years. She took Herceptin for a year. AND right sided breast cancer, stage II (T2 N0) 2.5 cm in size with a right mastectomy on 04/28/1996 with axillary dissection on 05/10/1996. 11 nodes were negative. She was treated with AC x4 cycles for ER negative, PR +20%, cancer.    Oncology History   Right sided breast cancer, stage II (T2 N0) 2.5 cm in size with a right mastectomy on 04/28/1996 with axillary dissection on 05/10/1996. 11 nodes were negative. She was treated with AC x4 cycles for ER negative, PR +20%, cancer.       Invasive ductal carcinoma of left breast   04/21/2006 Surgery Left breast lumpectomy showing an invasive ductal carcinoma measuring 2.1 cm with DCIS, 0/1 sentinal lymph node, ER 73%, PR 90%, HER 2 positive, Ki-67 19%.   05/31/2006 - 10/20/2006 Chemotherapy Herceptin.  D/C'd on 11/10/2006.  MUGA demonstrated a decrease in EF to 41% with inferior and lateral hypokinesia.   06/01/2006 - 11/10/2006 Chemotherapy Aromasin.  D/C'd on 11/10/2006.  Aromasin allergy is noted   07/12/2006 - 08/27/2006 Radiation Therapy     Breast cancer, right breast   05/10/1996 Surgery Righ mastectomy, 2.5 cm and axillar node dissection, 0/11 nodes.  ER 0%, PR 20%   07/18/1996 - 09/19/1996 Chemotherapy AC x 4 cycles   10/16/1996 - 09/27/2000 Chemotherapy Tamoxifen.  Approximate end date     I personally reviewed and went over radiographic studies with  the patient.  The results are noted within this dictation.  Left mammogram on 12/14/2012 was BIRADS 2.  She is due for her next screening mammogram this month, April 2015.   I personally reviewed and went over pathology results with the patient.  She was recently found to have a rectal carcinoid.  This was removed without any indication for residual disease.  From an oncology standpoint, there is not much more to due with regards to this diagnosis.  I suspect she will require close interval follow-up with GI.  She otherwise denies any complaints and oncologic ROS questioning is negative.     Past Medical History  Diagnosis Date  . Leukocytoclastic vasculitis   . Diabetes mellitus   . Breast cancer     left 2007/right 1997  . Left-sided Breast cancer 07/22/2011  . Cardiomyopathy 07/22/2011  . COPD (chronic obstructive pulmonary disease) 07/22/2011  . Hypertension   . Hyperlipidemia   . 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  Stage II (T2 N0).  ER 73%, PR 90%, Her2 positive, Ki-67 19%.  2.1 cm in size.  Agreed to only have radiation, Herceptin, and Aromasin.  Right sided breast cancer: Stage II (T2 N0 M0).  2.5 cm in size.  Surgery on 04/28/1996 with axillary dissection on 05/10/1996.  11 negative nodes.  Treated with AC x 4 cycles for ER negative, PR positive at only 20%.    . Breast  cancer, right breast 12/16/2013    has Invasive ductal carcinoma of left breast; Leukocytoclastic vasculitis; Cardiomyopathy; COPD (chronic obstructive pulmonary disease); Adenomatous polyp; Carcinoid tumor of rectum; and Breast cancer, right breast on her problem list.     is allergic to lisinopril and aromasin.  Ms. Hendrie does not currently have medications on file.  Past Surgical History  Procedure Laterality Date  . Mastectomy, radical      right  . Breast lumpectomy      left  . Port-a-cath removal    . Colonoscopy  07/20/2005    RMR:  Diminutive ascending colon polyp, status post resection . Inflamed focally adenomatous polyp  . Colonoscopy N/A 10/05/2013    RMR: Multiple rectal and colonic polyps-removed/treated. 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 other polyps tubular adenomas  . Flexible sigmoidoscopy N/A 10/11/2013    RMR: rectal polypectomy site identified, assitional resection and tatooing performed. 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 Flexible sigmoidoscopy N/A 11/24/2013    Procedure: FLEXIBLE SIGMOIDOSCOPY;  Surgeon: Daneil Dolin, MD;  Location: [facility];  Service: Endoscopy;  Laterality: N/A;  1:00-moved to [place] notified pt    Denies any headaches, dizziness, double vision, fevers, chills, night sweats, nausea, vomiting, diarrhea, constipation, chest pain, heart palpitations, shortness of breath, blood in stool, black tarry stool, urinary pain, urinary burning, urinary frequency, hematuria.   PHYSICAL EXAMINATION  ECOG PERFORMANCE STATUS: 0 - Asymptomatic  Filed Vitals:   12/18/13 1400  BP: 117/76  Pulse: 82  Temp: 97.8 F (36.6 C)  Resp: 18    GENERAL:alert, healthy, no distress, well nourished, well developed, comfortable, cooperative and smiling SKIN: skin color, texture, turgor are normal, no rashes or significant lesions HEAD: Normocephalic, No masses, lesions, tenderness or abnormalities EYES: normal, PERRLA, EOMI, Conjunctiva are pink and non-injected EARS: External ears normal OROPHARYNX:mucous membranes are moist  NECK: supple, no adenopathy, thyroid normal size, non-tender, without nodularity, no stridor, non-tender, trachea midline LYMPH:  no palpable lymphadenopathy, no hepatosplenomegaly BREAST:left breast appear normal with the exception of lumpectomy site in the 1 o'clock position, no suspicious masses, no skin or nipple changes or axillary nodes, right post-mastectomy site well healed and free of suspicious changes LUNGS: clear to  auscultation  HEART: regular rate & rhythm, no murmurs and no gallops ABDOMEN:abdomen soft, non-tender, normal bowel sounds, no masses or organomegaly and no hepatosplenomegaly BACK: Back symmetric, no curvature., No CVA tenderness EXTREMITIES:less then 2 second capillary refill, no joint deformities, effusion, or inflammation, no edema, no skin discoloration, no clubbing, no cyanosis  NEURO: alert & oriented x 3 with fluent speech, no focal motor/sensory deficits, gait normal   RADIOGRAPHIC STUDIES:  12/14/2012  *RADIOLOGY REPORT*  Clinical Data: History of right mastectomy. History of left  lumpectomy with radiation treatment in 2007.  DIGITAL DIAGNOSTIC LEFT MAMMOGRAM THE HEAD  Comparison: 08/12/2011 and earlier  Findings:  ACR Breast Density Category 2: There is a scattered fibroglandular  pattern. There are postoperative changes in the lateral portion of  the left breast.  Mammographic images were processed with CAD.  Physical exam: The patient is concerned regarding a mole in the  lower inner quadrant of the left breast. On physical exam of this  region, there is a 4 mm slightly raised likely pigmented mole  without complicating or suspicious features.  Mammographic images were processed with CAD.  IMPRESSION:  No mammographic evidence for malignancy.  RECOMMENDATION:  Screening mammogram is recommended in one year.  I have discussed the findings and recommendations with the patient.  Results were also provided in writing at the conclusion of the  visit. If applicable, a reminder letter will be sent to the  patient regarding her next appointment.  BI-RADS CATEGORY 2: Benign finding(s).  Original Report Authenticated By: Nolon Nations, M.D.   PATHOLOGY:  11/24/2013  Diagnosis Rectum, biopsy, rectal carcinoid - FOCAL EROSION AND FIBROSIS WITH BIOPSY TATTOO PIGMENT CONSISTENT WITH PREVIOUS BIOPSY. - NO RESIDUAL CARCINOID TUMOR.    10/11/2013  Diagnosis Rectum,  polyp(s) - RESIDUAL CARCINOID TUMOR WITH ADJACENT ULCER, SEE COMMENT.    10/05/2013  Diagnosis 1. Colon, polyp(s), cecal - POLYPOID COLORECTAL MUCOSA WITH INTRAMUCOSAL LYMPHOID AGGREGATES. NO ADENOMATOUS CHANGE OR MALIGNANCY IDENTIFIED. 2. Colon, polyp(s), hepatic flexure - TUBULAR ADENOMA (TWO FRAGMENTS). NO HIGH GRADE DYSPLASIA OR MALIGNANCY IDENTIFIED. 3. Rectum, polyp(s), at 5 cm - CARCINOID TUMOR, INVOLVING THE CAUTERIZED MARGIN.     ASSESSMENT:  1. Left-sided breast cancer diagnosed in August 2007, stage II, ER 73%, PR 90%, HER-2 positive, Ki-67 marker 19% (T2 N0) 2.1 cm in size. She refused chemotherapy but agreed to radiation therapy, Herceptin, and Aromasin. She is taking Aromasin for 5 years. She took Herceptin for a year. 2. Right sided breast cancer, stage II (T2 N0) 2.5 cm in size with a right mastectomy on 04/28/1996 with axillary dissection on 05/10/1996. 11 nodes were negative. She was treated with AC x4 cycles for ER negative, PR +20%, cancer.  3. Carcinoid tumor of rectum, S/P resection by Dr. Gala Romney on 10/05/2013 with residual tumor at the margins.  Resection occurred on 10/11/2013 with residual tumor.  No residual tumor on 11/24/2013 investigation.  Patient Active Problem List   Diagnosis Date Noted  . Breast cancer, right breast 12/16/2013  . 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 Adenomatous polyp 10/02/2013  . 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 Cardiomyopathy 07/22/2011  . COPD (chronic obstructive pulmonary disease) 07/22/2011     PLAN:  1. I personally reviewed and went over laboratory results with the patient.  The results are noted within this dictation. 2. I personally reviewed and went over radiographic studies with the patient.  The results are noted within this dictation.   3. I personally reviewed and went over pathology results with the patient. 4. Next screening mammogram due in April  2015 (this month). 5. No role for lab work from an oncology standpoint. 6. We will see her back in 1 year for follow-up.   THERAPY PLAN:  NCCN guidelines recommends the following surveillance for invasive breast cancer:  A. History and Physical exam every 4-6 months for 5 years and then every 12 months.  B. Mammography every 12 months  C. Women on Tamoxifen: annual gynecologic assessment every 12 months if uterus is present.  D. Women on aromatase inhibitor or who experience ovarian failure secondary to treatment should have monitoring of bone health with a bone mineral density determination at baseline and periodically thereafter.  E. Assess and encourage adherence to adjuvant endocrine therapy.  F. Evidence suggests that active lifestyle and achieving and maintaining an ideal body weight (20-25 BMI) may lead to optimal breast cancer outcomes.  All questions were answered. The patient knows to call the clinic with any problems, questions or concerns. We can certainly see the patient much sooner if necessary.  Patient and plan discussed with Dr. Farrel Gobble and he is in agreement with the aforementioned.    KEFALAS,THOMAS 12/18/2013

## 2013-12-18 ENCOUNTER — Encounter (HOSPITAL_COMMUNITY): Payer: Self-pay | Admitting: Oncology

## 2013-12-18 ENCOUNTER — Encounter (HOSPITAL_COMMUNITY): Payer: Medicare HMO | Attending: Oncology | Admitting: Oncology

## 2013-12-18 VITALS — BP 117/76 | HR 82 | Temp 97.8°F | Resp 18 | Wt 161.0 lb

## 2013-12-18 DIAGNOSIS — D3A026 Benign carcinoid tumor of the rectum: Secondary | ICD-10-CM

## 2013-12-18 DIAGNOSIS — C50919 Malignant neoplasm of unspecified site of unspecified female breast: Secondary | ICD-10-CM

## 2013-12-18 DIAGNOSIS — J449 Chronic obstructive pulmonary disease, unspecified: Secondary | ICD-10-CM

## 2013-12-18 DIAGNOSIS — I43 Cardiomyopathy in diseases classified elsewhere: Secondary | ICD-10-CM

## 2013-12-18 DIAGNOSIS — C50911 Malignant neoplasm of unspecified site of right female breast: Secondary | ICD-10-CM

## 2013-12-18 NOTE — Patient Instructions (Signed)
[facility] Discharge Instructions  RECOMMENDATIONS MADE BY THE CONSULTANT AND ANY TEST RESULTS WILL BE SENT TO YOUR REFERRING PHYSICIAN.  EXAM FINDINGS BY THE PHYSICIAN TODAY AND SIGNS OR SYMPTOMS TO REPORT TO CLINIC OR PRIMARY PHYSICIAN: Exam and findings as discussed by Kirby Crigler, PA.  MEDICATIONS PRESCRIBED:  Continue all medications as prescribed.  INSTRUCTIONS/FOLLOW-UP: REMINDER: You are due for mammogram this month, April 2015. Please don't forget to have it done. Return to clinic in 1 year for follow up. Report any issues/concerns as needed to clinic.  Thank you for choosing [facility] to provide your oncology and hematology care.  To afford each patient quality time with our providers, please arrive at least 15 minutes before your scheduled appointment time.  With your help, our goal is to use those 15 minutes to complete the necessary work-up to ensure our physicians have the information they need to help with your evaluation and healthcare recommendations.    Effective January 1st, 2014, we ask that you re-schedule your appointment with our physicians should you arrive 10 or more minutes late for your appointment.  We strive to give you quality time with our providers, and arriving late affects you and other patients whose appointments are after yours.    Again, thank you for choosing [facility].  Our hope is that these requests will decrease the amount of time that you wait before being seen by our physicians.       _____________________________________________________________  Should you have questions after your visit to [facility], please contact our office at [phone] between the hours of 8:30 a.m. and 5:00 p.m.  Voicemails left after 4:30 p.m. will not be returned until the following business day.  For prescription refill requests, have your pharmacy contact our office with your prescription refill request.

## 2014-03-28 ENCOUNTER — Other Ambulatory Visit: Payer: Self-pay | Admitting: Family Medicine

## 2014-09-27 ENCOUNTER — Encounter (HOSPITAL_COMMUNITY): Payer: Self-pay

## 2014-09-27 ENCOUNTER — Inpatient Hospital Stay (HOSPITAL_COMMUNITY)
Admission: EM | Admit: 2014-09-27 | Discharge: 2014-10-01 | DRG: 065 | Disposition: A | Discharge status: Rehabilitation Facility | Payer: Commercial Managed Care - HMO | Attending: Internal Medicine | Admitting: Internal Medicine

## 2014-09-27 ENCOUNTER — Emergency Department (HOSPITAL_COMMUNITY): Payer: Commercial Managed Care - HMO

## 2014-09-27 DIAGNOSIS — E119 Type 2 diabetes mellitus without complications: Secondary | ICD-10-CM | POA: Diagnosis present

## 2014-09-27 DIAGNOSIS — E785 Hyperlipidemia, unspecified: Secondary | ICD-10-CM | POA: Diagnosis present

## 2014-09-27 DIAGNOSIS — I639 Cerebral infarction, unspecified: Secondary | ICD-10-CM | POA: Diagnosis present

## 2014-09-27 DIAGNOSIS — F17210 Nicotine dependence, cigarettes, uncomplicated: Secondary | ICD-10-CM | POA: Diagnosis present

## 2014-09-27 DIAGNOSIS — E876 Hypokalemia: Secondary | ICD-10-CM | POA: Diagnosis present

## 2014-09-27 DIAGNOSIS — Z833 Family history of diabetes mellitus: Secondary | ICD-10-CM | POA: Diagnosis not present

## 2014-09-27 DIAGNOSIS — Z7982 Long term (current) use of aspirin: Secondary | ICD-10-CM

## 2014-09-27 DIAGNOSIS — R51 Headache: Secondary | ICD-10-CM | POA: Diagnosis present

## 2014-09-27 DIAGNOSIS — C50912 Malignant neoplasm of unspecified site of left female breast: Secondary | ICD-10-CM | POA: Diagnosis present

## 2014-09-27 DIAGNOSIS — Z853 Personal history of malignant neoplasm of breast: Secondary | ICD-10-CM | POA: Diagnosis not present

## 2014-09-27 DIAGNOSIS — C50911 Malignant neoplasm of unspecified site of right female breast: Secondary | ICD-10-CM | POA: Diagnosis present

## 2014-09-27 DIAGNOSIS — Z809 Family history of malignant neoplasm, unspecified: Secondary | ICD-10-CM

## 2014-09-27 DIAGNOSIS — I429 Cardiomyopathy, unspecified: Secondary | ICD-10-CM | POA: Diagnosis present

## 2014-09-27 DIAGNOSIS — J449 Chronic obstructive pulmonary disease, unspecified: Secondary | ICD-10-CM | POA: Diagnosis present

## 2014-09-27 DIAGNOSIS — I10 Essential (primary) hypertension: Secondary | ICD-10-CM | POA: Diagnosis present

## 2014-09-27 DIAGNOSIS — G459 Transient cerebral ischemic attack, unspecified: Secondary | ICD-10-CM | POA: Diagnosis present

## 2014-09-27 LAB — COMPREHENSIVE METABOLIC PANEL
ALBUMIN: 4 g/dL (ref 3.5–5.2)
ALK PHOS: 139 U/L — AB (ref 39–117)
ALT: 36 U/L — ABNORMAL HIGH (ref 0–35)
AST: 66 U/L — AB (ref 0–37)
Anion gap: 9 (ref 5–15)
BUN: 13 mg/dL (ref 6–23)
CO2: 26 mmol/L (ref 19–32)
CREATININE: 0.89 mg/dL (ref 0.50–1.10)
Calcium: 9.1 mg/dL (ref 8.4–10.5)
Chloride: 97 mEq/L (ref 96–112)
GFR calc non Af Amer: 67 mL/min — ABNORMAL LOW (ref >90)
GFR, EST AFRICAN AMERICAN: 77 mL/min — AB (ref >90)
Glucose, Bld: 195 mg/dL — ABNORMAL HIGH (ref 70–99)
Potassium: 3.1 mmol/L — ABNORMAL LOW (ref 3.5–5.1)
SODIUM: 132 mmol/L — AB (ref 135–145)
TOTAL PROTEIN: 8.3 g/dL (ref 6.0–8.3)
Total Bilirubin: 0.4 mg/dL (ref 0.3–1.2)

## 2014-09-27 LAB — URINALYSIS, ROUTINE W REFLEX MICROSCOPIC
BILIRUBIN URINE: NEGATIVE
Glucose, UA: NEGATIVE mg/dL
Hgb urine dipstick: NEGATIVE
Ketones, ur: NEGATIVE mg/dL
Leukocytes, UA: NEGATIVE
Nitrite: NEGATIVE
PROTEIN: NEGATIVE mg/dL
SPECIFIC GRAVITY, URINE: 1.02 (ref 1.005–1.030)
Urobilinogen, UA: 1 mg/dL (ref 0.0–1.0)
pH: 7 (ref 5.0–8.0)

## 2014-09-27 LAB — PROTIME-INR
INR: 1 (ref 0.00–1.49)
Prothrombin Time: 13.3 seconds (ref 11.6–15.2)

## 2014-09-27 LAB — CBC
HCT: 44.3 % (ref 36.0–46.0)
Hemoglobin: 14.6 g/dL (ref 12.0–15.0)
MCH: 29.4 pg (ref 26.0–34.0)
MCHC: 33 g/dL (ref 30.0–36.0)
MCV: 89.3 fL (ref 78.0–100.0)
Platelets: 298 10*3/uL (ref 150–400)
RBC: 4.96 MIL/uL (ref 3.87–5.11)
RDW: 14.2 % (ref 11.5–15.5)
WBC: 11.6 10*3/uL — ABNORMAL HIGH (ref 4.0–10.5)

## 2014-09-27 LAB — DIFFERENTIAL
BASOS ABS: 0 10*3/uL (ref 0.0–0.1)
Basophils Relative: 0 % (ref 0–1)
Eosinophils Absolute: 0.2 10*3/uL (ref 0.0–0.7)
Eosinophils Relative: 1 % (ref 0–5)
Lymphocytes Relative: 32 % (ref 12–46)
Lymphs Abs: 3.7 10*3/uL (ref 0.7–4.0)
Monocytes Absolute: 0.7 10*3/uL (ref 0.1–1.0)
Monocytes Relative: 6 % (ref 3–12)
NEUTROS ABS: 7 10*3/uL (ref 1.7–7.7)
NEUTROS PCT: 61 % (ref 43–77)

## 2014-09-27 LAB — RAPID URINE DRUG SCREEN, HOSP PERFORMED
Amphetamines: NOT DETECTED
Barbiturates: NOT DETECTED
Benzodiazepines: NOT DETECTED
Cocaine: NOT DETECTED
OPIATES: NOT DETECTED
Tetrahydrocannabinol: NOT DETECTED

## 2014-09-27 LAB — ETHANOL: Alcohol, Ethyl (B): <5 mg/dL (ref 0–9)

## 2014-09-27 LAB — I-STAT TROPONIN, ED: TROPONIN I, POC: 0 ng/mL (ref 0.00–0.08)

## 2014-09-27 LAB — APTT: aPTT: 29 seconds (ref 24–37)

## 2014-09-27 MED ORDER — ACETAMINOPHEN 325 MG PO TABS
325.0000 mg | ORAL_TABLET | Freq: Every evening | ORAL | Status: DC | PRN
  Administered 2014-09-27: 325 mg via ORAL
  Filled 2014-09-27: qty 1

## 2014-09-27 NOTE — ED Notes (Signed)
During swallow evaluation paper able to swallow, however stated "something is wrong". Will keep patient NPO at this time.

## 2014-09-27 NOTE — ED Provider Notes (Signed)
CSN: [id number]     Arrival date & time 09/27/14  2135 History  This chart was scribed for Orpah Greek, * by Molli Posey, ED Scribe. This patient was seen in room [facility]18/[facility]18 and the patient's care was started 9:51 PM.    Chief Complaint  Patient presents with  . Dizziness  . Headache   The history is provided by the patient. No language interpreter was used.   HPI Comments: Madison Todd is a 64 y.o. female with a history of DM, COPD, HTN, and left-sided breast CA who presents to the Emergency Department complaining of a sharp pain in her head that started earlier and has since self resolved. Pt states her right side felt weak that started 1.5 hours ago that has also since self resolved. She reports associated dizziness and trouble swallowing with her episode. Pt states that al of her symptoms have resolved at this time. Pt states that she was leaning to her right side earlier when she was walking. She says she has not taken her toprol for the last several days. Pt reports no alleviating or modifying factors at this time.   Past Medical History  Diagnosis Date  . Leukocytoclastic vasculitis   . Diabetes mellitus   . Breast cancer     left 2007/right 1997  . Left-sided Breast cancer 07/22/2011  . Cardiomyopathy 07/22/2011  . COPD (chronic obstructive pulmonary disease) 07/22/2011  . Hypertension   . Hyperlipidemia   . 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  Stage II (T2 N0).  ER 73%, PR 90%, Her2 positive, Ki-67 19%.  2.1 cm in size.  Agreed to only have radiation, Herceptin, and Aromasin.  Right sided breast cancer: Stage II (T2 N0 M0).  2.5 cm in size.  Surgery on 04/28/1996 with axillary dissection on 05/10/1996.  11 negative nodes.  Treated with AC x 4 cycles for ER negative, PR positive at only 20%.    . Breast cancer, right breast 12/16/2013   Past Surgical History  Procedure Laterality Date  .  Mastectomy, radical      right  . Breast lumpectomy      left  . Port-a-cath removal    . Colonoscopy  07/20/2005    RMR: Diminutive ascending colon polyp, status post resection . Inflamed focally adenomatous polyp  . Colonoscopy N/A 10/05/2013    RMR: Multiple rectal and colonic polyps-removed/treated. 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 other polyps tubular adenomas  . Flexible sigmoidoscopy N/A 10/11/2013    RMR: rectal polypectomy site identified, assitional resection and tatooing performed. 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 Flexible sigmoidoscopy N/A 11/24/2013    Procedure: FLEXIBLE SIGMOIDOSCOPY;  Surgeon: Daneil Dolin, MD;  Location: [facility];  Service: Endoscopy;  Laterality: N/A;  1:00-moved to [place] notified pt   Family History  Problem Relation Age of Onset  . Diabetes Father   . Cancer Maternal Aunt   . Cancer Paternal Aunt   . Colon cancer Neg Hx    History  Substance Use Topics  . Smoking status: Current Every Day Smoker -- 0.50 packs/day for 18 years    Types: Cigarettes  . Smokeless tobacco: Never Used     Comment: smokes 12 cigarettes daily  . Alcohol Use: No   OB History    No data available     Review of Systems  HENT: Positive for trouble  swallowing.   Neurological: Positive for dizziness and headaches.  All other systems reviewed and are negative.   Allergies  Lisinopril and Aromasin  Home Medications   Prior to Admission medications   Medication Sig Start Date End Date Taking? Authorizing Provider  acetaminophen (TYLENOL) 325 MG tablet Take 325 mg by mouth at bedtime as needed (help relax, sleep).    Yes Historical Provider, MD  aspirin EC 81 MG tablet Take 81 mg by mouth daily.   Yes Historical Provider, MD  cholecalciferol (VITAMIN D) 1000 UNITS tablet Take 1,000 Units by mouth daily.   Yes Historical Provider, MD  ibuprofen (ADVIL,MOTRIN) 200 MG tablet Take 200 mg by mouth every 6 (six) hours  as needed for mild pain or moderate pain.   Yes Historical Provider, MD  metFORMIN (GLUCOPHAGE-XR) 500 MG 24 hr tablet Take 500 mg by mouth daily with breakfast.     Yes Historical Provider, MD  metoprolol (TOPROL-XL) 50 MG 24 hr tablet Take 50 mg by mouth daily.     Yes Historical Provider, MD  Olmesartan-Amlodipine-HCTZ (TRIBENZOR) 40-5-12.5 MG TABS Take 1 tablet by mouth daily.     Yes Historical Provider, MD   BP 183/101 mmHg  Pulse 78  Temp(Src) 97.8 F (36.6 C) (Oral)  Resp 16  Ht 5' 4"  (1.626 m)  Wt 152 lb (68.947 kg)  BMI 26.08 kg/m2  SpO2 95% Physical Exam  Constitutional: She is oriented to person, place, and time. She appears well-developed and well-nourished. No distress.  HENT:  Head: Normocephalic and atraumatic.  Right Ear: Hearing normal.  Left Ear: Hearing normal.  Nose: Nose normal.  Mouth/Throat: Oropharynx is clear and moist and mucous membranes are normal.  Eyes: Conjunctivae and EOM are normal. Pupils are equal, round, and reactive to light.  Neck: Normal range of motion. Neck supple.  Cardiovascular: Regular rhythm, S1 normal and S2 normal.  Exam reveals no gallop and no friction rub.   No murmur heard. Pulmonary/Chest: Effort normal and breath sounds normal. No respiratory distress. She exhibits no tenderness.  Abdominal: Soft. Normal appearance and bowel sounds are normal. There is no hepatosplenomegaly. There is no tenderness. There is no rebound, no guarding, no tenderness at McBurney's point and negative Murphy's sign. No hernia.  Musculoskeletal: Normal range of motion.  Neurological: She is alert and oriented to person, place, and time. She has normal strength. No cranial nerve deficit or sensory deficit. Coordination normal. GCS eye subscore is 4. GCS verbal subscore is 5. GCS motor subscore is 6.  Skin: Skin is warm, dry and intact. No rash noted. No cyanosis.  Psychiatric: She has a normal mood and affect. Her speech is normal and behavior is normal.  Thought content normal.  Nursing note and vitals reviewed.   ED Course  Procedures   DIAGNOSTIC STUDIES: Oxygen Saturation is 95% on RA, normal by my interpretation.    COORDINATION OF CARE: 9:56 PM Discussed treatment plan with pt at bedside and pt agreed to plan.   Labs Review Labs Reviewed  CBC - Abnormal; Notable for the following:    WBC 11.6 (*)    All other components within normal limits  COMPREHENSIVE METABOLIC PANEL - Abnormal; Notable for the following:    Sodium 132 (*)    Potassium 3.1 (*)    Glucose, Bld 195 (*)    AST 66 (*)    ALT 36 (*)    Alkaline Phosphatase 139 (*)    GFR calc non Af Amer 67 (*)  GFR calc Af Amer 77 (*)    All other components within normal limits  ETHANOL  PROTIME-INR  APTT  DIFFERENTIAL  URINE RAPID DRUG SCREEN (HOSP PERFORMED)  URINALYSIS, ROUTINE W REFLEX MICROSCOPIC  I-STAT CHEM 8, ED  I-STAT TROPOININ, ED  I-STAT TROPOININ, ED    Imaging Review Ct Head Wo Contrast  09/27/2014   CLINICAL DATA:  Dizziness, headache, difficulty swallowing  EXAM: CT HEAD WITHOUT CONTRAST  TECHNIQUE: Contiguous axial images were obtained from the base of the skull through the vertex without intravenous contrast.  COMPARISON:  None.  FINDINGS: Gray-white differentiation is maintained. No CT evidence acute large territory infarct. No intraparenchymal or extra-axial mass or hemorrhage. Normal size and configuration of the ventricles and basilar cisterns. No midline shift. Limited visualization of the paranasal sinuses and mastoid air cells is normal. Regional soft tissues appear normal. No displaced calvarial fracture.  IMPRESSION: Negative noncontrast head CT.   Electronically Signed   By: Sandi Mariscal M.D.   On: 09/27/2014 22:48     EKG Interpretation None       Date: @EDTODAY (<PARAMETER> error)@  Rate: 77  Rhythm: normal sinus rhythm  QRS Axis: normal  Intervals: normal  ST/T Wave abnormalities: normal  Conduction Disutrbances:none   Narrative Interpretation:   Old EKG Reviewed: none available    MDM   Final diagnoses:  None  TIA  Patient had onset of headache earlier today. She had a slight headache on the right side and then took Tylenol and it resolved. After that sometime, however, she noted that she was feeling dizzy and then had right-sided weakness. She was having trouble walking because she was falling to the right. She noticed that her arm was weak as well has her leg. By the time she arrived in the ER, the right-sided weakness has resolved. She did notice that she was having some difficulty swallowing, however. I did not identify any focal deficits on examination. Head CT performed, negative. Remainder of workup was negative. Symptoms most consistent with TIA, will admit to the hospital for further management.  I personally performed the services described in this documentation, which was scribed in my presence. The recorded information has been reviewed and is accurate.       Orpah Greek, MD 09/27/14 [phone]

## 2014-09-27 NOTE — H&P (Signed)
PCP:   Maggie Font, MD   Chief Complaint:  Gait imbalance  HPI:  61 old female who  has a past medical history of Leukocytoclastic vasculitis; Diabetes mellitus; Breast cancer; Left-sided Breast cancer (07/22/2011); Cardiomyopathy (07/22/2011); COPD (chronic obstructive pulmonary disease) (07/22/2011); Hypertension; Hyperlipidemia; Invasive ductal carcinoma of left breast (07/22/2011); and Breast cancer, right breast (12/16/2013).  Patient was brought to the ED after patient started having right-sided headache which improved after taking Tylenol, but patient noticed that she was having gait imbalance on walking, and she was leaning towards right side. Though she denies slurred speech, no weakness of extremities, did not pass out no blurred vision. Patient's symptoms resolved when she reached ED. Denies headache at this time. No chest pain shortness of breath. CT of the head was done in the ED, which was negative for stroke. Patient does take aspirin 81 mg by mouth daily.   Allergies:   Allergies  Allergen Reactions  . Lisinopril Other (See Comments)    Tongue swelling  . Aromasin [Exemestane] Rash      Past Medical History  Diagnosis Date  . Leukocytoclastic vasculitis   . Diabetes mellitus   . Breast cancer     left 2007/right 1997  . Left-sided Breast cancer 07/22/2011  . Cardiomyopathy 07/22/2011  . COPD (chronic obstructive pulmonary disease) 07/22/2011  . Hypertension   . Hyperlipidemia   . 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  Stage II (T2 N0).  ER 73%, PR 90%, Her2 positive, Ki-67 19%.  2.1 cm in size.  Agreed to only have radiation, Herceptin, and Aromasin.  Right sided breast cancer: Stage II (T2 N0 M0).  2.5 cm in size.  Surgery on 04/28/1996 with axillary dissection on 05/10/1996.  11 negative nodes.  Treated with AC x 4 cycles for ER negative, PR positive at only 20%.    . Breast cancer, right breast 12/16/2013     Past Surgical History  Procedure Laterality Date  . Mastectomy, radical      right  . Breast lumpectomy      left  . Port-a-cath removal    . Colonoscopy  07/20/2005    RMR: Diminutive ascending colon polyp, status post resection . Inflamed focally adenomatous polyp  . Colonoscopy N/A 10/05/2013    RMR: Multiple rectal and colonic polyps-removed/treated. 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 other polyps tubular adenomas  . Flexible sigmoidoscopy N/A 10/11/2013    RMR: rectal polypectomy site identified, assitional resection and tatooing performed. 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 Flexible sigmoidoscopy N/A 11/24/2013    Procedure: FLEXIBLE SIGMOIDOSCOPY;  Surgeon: Daneil Dolin, MD;  Location: [facility];  Service: Endoscopy;  Laterality: N/A;  1:00-moved to [place] notified pt    Prior to Admission medications   Medication Sig Start Date End Date Taking? Authorizing Provider  acetaminophen (TYLENOL) 325 MG tablet Take 325 mg by mouth at bedtime as needed (help relax, sleep).    Yes Historical Provider, MD  aspirin EC 81 MG tablet Take 81 mg by mouth daily.   Yes Historical Provider, MD  cholecalciferol (VITAMIN D) 1000 UNITS tablet Take 1,000 Units by mouth daily.   Yes Historical Provider, MD  ibuprofen (ADVIL,MOTRIN) 200 MG tablet Take 200 mg by mouth every 6 (six) hours as needed for mild pain or moderate pain.   Yes Historical Provider, MD  metFORMIN (GLUCOPHAGE-XR) 500 MG 24  hr tablet Take 500 mg by mouth daily with breakfast.     Yes Historical Provider, MD  metoprolol (TOPROL-XL) 50 MG 24 hr tablet Take 50 mg by mouth daily.     Yes Historical Provider, MD  Olmesartan-Amlodipine-HCTZ (TRIBENZOR) 40-5-12.5 MG TABS Take 1 tablet by mouth daily.     Yes Historical Provider, MD    Social History:  reports that she has been smoking Cigarettes.  She has a 9 pack-year smoking history. She has never used smokeless tobacco. She reports  that she does not drink alcohol or use illicit drugs.  Family History  Problem Relation Age of Onset  . Diabetes Father   . Cancer Maternal Aunt   . Cancer Paternal Aunt   . Colon cancer Neg Hx      All the positives are listed in BOLD  Review of Systems:  HEENT: Headache, blurred vision, runny nose, sore throat Neck: Hypothyroidism, hyperthyroidism,,lymphadenopathy Chest : Shortness of breath, history of COPD, Asthma Heart : Chest pain, history of coronary arterey disease GI:  Nausea, vomiting, diarrhea, constipation, GERD GU: Dysuria, urgency, frequency of urination, hematuria Neuro: Stroke, seizures, syncope Psych: Depression, anxiety, hallucinations   Physical Exam: Blood pressure 174/96, pulse 66, temperature 97.8 F (36.6 C), temperature source Oral, resp. rate 16, height 5' 4"  (1.626 m), weight 68.947 kg (152 lb), SpO2 95 %. Constitutional:   Patient is a well-developed and well-nourished female in no acute distress and cooperative with exam. Head: Normocephalic and atraumatic Mouth: Mucus membranes moist Eyes: PERRL, EOMI, conjunctivae normal Neck: Supple, No Thyromegaly Cardiovascular: RRR, S1 normal, S2 normal Pulmonary/Chest: CTAB, no wheezes, rales, or rhonchi Abdominal: Soft. Non-tender, non-distended, bowel sounds are normal, no masses, organomegaly, or guarding present.  Neurological: A&O x3, Strength is normal and symmetric bilaterally, cranial nerve II-XII are grossly intact, no focal motor deficit, sensory intact to light touch bilaterally.  Extremities : No Cyanosis, Clubbing or Edema  Labs on Admission:  Basic Metabolic Panel:  Recent Labs Lab 09/27/14 2154  NA 132*  K 3.1*  CL 97  CO2 26  GLUCOSE 195*  BUN 13  CREATININE 0.89  CALCIUM 9.1   Liver Function Tests:  Recent Labs Lab 09/27/14 2154  AST 66*  ALT 36*  ALKPHOS 139*  BILITOT 0.4  PROT 8.3  ALBUMIN 4.0   No results for input(s): LIPASE, AMYLASE in the last 168 hours. No  results for input(s): AMMONIA in the last 168 hours. CBC:  Recent Labs Lab 09/27/14 2154  WBC 11.6*  NEUTROABS 7.0  HGB 14.6  HCT 44.3  MCV 89.3  PLT 298   Cardiac Enzymes: No results for input(s): CKTOTAL, CKMB, CKMBINDEX, TROPONINI in the last 168 hours.  BNP (last 3 results) No results for input(s): PROBNP in the last 8760 hours. CBG: No results for input(s): GLUCAP in the last 168 hours.  Radiological Exams on Admission: Ct Head Wo Contrast  09/27/2014   CLINICAL DATA:  Dizziness, headache, difficulty swallowing  EXAM: CT HEAD WITHOUT CONTRAST  TECHNIQUE: Contiguous axial images were obtained from the base of the skull through the vertex without intravenous contrast.  COMPARISON:  None.  FINDINGS: Gray-white differentiation is maintained. No CT evidence acute large territory infarct. No intraparenchymal or extra-axial mass or hemorrhage. Normal size and configuration of the ventricles and basilar cisterns. No midline shift. Limited visualization of the paranasal sinuses and mastoid air cells is normal. Regional soft tissues appear normal. No displaced calvarial fracture.  IMPRESSION: Negative noncontrast head CT.   Electronically  Signed   By: Sandi Mariscal M.D.   On: 09/27/2014 22:48    EKG: Independently reviewed. Normal sinus rhythm   Assessment/Plan Active Problems:   Invasive ductal carcinoma of left breast   TIA (transient ischemic attack)   HTN (hypertension)   Hypokalemia  TIA versus stroke Will admit the patient for TIA workup. Obtain MRI/MRA brain the morning. Will also obtain echocardiogram and carotid ultrasound. Check hemoglobin A1c and fasting lipid profile. Continue aspirin 81 mg by mouth daily.  Hypertension Hold antihypertensive medications for permissive hypertension.  Hypokalemia Replace potassium. Check CMP in morning  Diabetes mellitus Initiate sliding scale insulin.  DVT prophylaxis Lovenox  Code status: Full code  Family discussion: No  family at bedside   Time Spent on Admission: 60 minutes   [facility] Pager: [phone] 09/27/2014, 11:48 PM  If 7PM-7AM, please contact night-coverage  [email or web address]  Password [email or web address]

## 2014-09-27 NOTE — ED Notes (Signed)
Patient states that she has a headache, dizziness, and trouble swallowing. Started around 1.5 hours ago per pt.

## 2014-09-28 ENCOUNTER — Observation Stay (HOSPITAL_COMMUNITY): Payer: Commercial Managed Care - HMO

## 2014-09-28 DIAGNOSIS — I517 Cardiomegaly: Secondary | ICD-10-CM

## 2014-09-28 DIAGNOSIS — I639 Cerebral infarction, unspecified: Secondary | ICD-10-CM | POA: Diagnosis present

## 2014-09-28 DIAGNOSIS — I10 Essential (primary) hypertension: Secondary | ICD-10-CM

## 2014-09-28 LAB — LIPID PANEL
CHOL/HDL RATIO: 5.1 ratio
Cholesterol: 184 mg/dL (ref 0–200)
HDL: 36 mg/dL — ABNORMAL LOW (ref >39)
LDL Cholesterol: 136 mg/dL — ABNORMAL HIGH (ref 0–99)
Triglycerides: 62 mg/dL (ref <150)
VLDL: 12 mg/dL (ref 0–40)

## 2014-09-28 LAB — POCT I-STAT, CHEM 8
BUN: 15 mg/dL (ref 6–23)
Calcium, Ion: 0.98 mmol/L — ABNORMAL LOW (ref 1.13–1.30)
Chloride: 98 mEq/L (ref 96–112)
Creatinine, Ser: 0.8 mg/dL (ref 0.50–1.10)
Glucose, Bld: 199 mg/dL — ABNORMAL HIGH (ref 70–99)
HCT: 50 % — ABNORMAL HIGH (ref 36.0–46.0)
Hemoglobin: 17 g/dL — ABNORMAL HIGH (ref 12.0–15.0)
Potassium: 4 mmol/L (ref 3.5–5.1)
Sodium: 138 mmol/L (ref 135–145)
TCO2: 25 mmol/L (ref 0–100)

## 2014-09-28 LAB — BASIC METABOLIC PANEL
Anion gap: 9 (ref 5–15)
BUN: 10 mg/dL (ref 6–23)
CO2: 26 mmol/L (ref 19–32)
Calcium: 8.9 mg/dL (ref 8.4–10.5)
Chloride: 98 mEq/L (ref 96–112)
Creatinine, Ser: 0.55 mg/dL (ref 0.50–1.10)
GFR calc non Af Amer: >90 mL/min (ref >90)
Glucose, Bld: 269 mg/dL — ABNORMAL HIGH (ref 70–99)
POTASSIUM: 3.7 mmol/L (ref 3.5–5.1)
SODIUM: 133 mmol/L — AB (ref 135–145)

## 2014-09-28 LAB — GLUCOSE, CAPILLARY
GLUCOSE-CAPILLARY: 182 mg/dL — AB (ref 70–99)
Glucose-Capillary: 210 mg/dL — ABNORMAL HIGH (ref 70–99)
Glucose-Capillary: 271 mg/dL — ABNORMAL HIGH (ref 70–99)
Glucose-Capillary: 231 mg/dL — ABNORMAL HIGH (ref 70–99)
Glucose-Capillary: 152 mg/dL — ABNORMAL HIGH (ref 70–99)

## 2014-09-28 LAB — HEMOGLOBIN A1C
HEMOGLOBIN A1C: 8.9 % — AB (ref <5.7)
Mean Plasma Glucose: 209 mg/dL — ABNORMAL HIGH (ref <117)

## 2014-09-28 MED ORDER — ASPIRIN 300 MG RE SUPP
300.0000 mg | Freq: Once | RECTAL | Status: AC
  Administered 2014-09-28: 300 mg via RECTAL
  Filled 2014-09-28: qty 1

## 2014-09-28 MED ORDER — ONDANSETRON HCL 4 MG/2ML IJ SOLN
4.0000 mg | Freq: Four times a day (QID) | INTRAMUSCULAR | Status: DC | PRN
Start: 2014-09-28 — End: 2014-10-01
  Administered 2014-09-28: 4 mg via INTRAVENOUS
  Filled 2014-09-28: qty 2

## 2014-09-28 MED ORDER — INSULIN ASPART 100 UNIT/ML ~~LOC~~ SOLN
0.0000 [IU] | Freq: Three times a day (TID) | SUBCUTANEOUS | Status: DC
  Administered 2014-09-28: 5 [IU] via SUBCUTANEOUS

## 2014-09-28 MED ORDER — STROKE: EARLY STAGES OF RECOVERY BOOK
Freq: Once | Status: AC
  Administered 2014-09-28: 12:00:00
  Filled 2014-09-28: qty 1

## 2014-09-28 MED ORDER — INSULIN ASPART 100 UNIT/ML ~~LOC~~ SOLN
0.0000 [IU] | Freq: Three times a day (TID) | SUBCUTANEOUS | Status: DC
  Administered 2014-09-28: 3 [IU] via SUBCUTANEOUS
  Administered 2014-09-28: 5 [IU] via SUBCUTANEOUS
  Administered 2014-09-29: 2 [IU] via SUBCUTANEOUS
  Administered 2014-09-29: 3 [IU] via SUBCUTANEOUS
  Administered 2014-09-29 – 2014-09-30 (×3): 2 [IU] via SUBCUTANEOUS

## 2014-09-28 MED ORDER — ENOXAPARIN SODIUM 40 MG/0.4ML ~~LOC~~ SOLN: 40.0000 mg | SUBCUTANEOUS | Status: DC

## 2014-09-28 MED ORDER — CETYLPYRIDINIUM CHLORIDE 0.05 % MT LIQD
7.0000 mL | Freq: Two times a day (BID) | OROMUCOSAL | Status: DC
  Administered 2014-09-28 – 2014-10-01 (×7): 7 mL via OROMUCOSAL

## 2014-09-28 MED ORDER — INSULIN ASPART 100 UNIT/ML ~~LOC~~ SOLN: 0.0000 [IU] | Freq: Every day | SUBCUTANEOUS | Status: DC

## 2014-09-28 MED ORDER — POTASSIUM CHLORIDE 10 MEQ/100ML IV SOLN
10.0000 meq | INTRAVENOUS | Status: AC
  Administered 2014-09-28 (×3): 10 meq via INTRAVENOUS
  Filled 2014-09-28 (×3): qty 100

## 2014-09-28 MED ORDER — ENOXAPARIN SODIUM 40 MG/0.4ML ~~LOC~~ SOLN
40.0000 mg | SUBCUTANEOUS | Status: DC
  Administered 2014-09-28 – 2014-10-01 (×4): 40 mg via SUBCUTANEOUS
  Filled 2014-09-28 (×4): qty 0.4

## 2014-09-28 MED ORDER — ASPIRIN EC 81 MG PO TBEC
81.0000 mg | DELAYED_RELEASE_TABLET | Freq: Every day | ORAL | Status: DC
  Administered 2014-09-29: 81 mg via ORAL
  Filled 2014-09-28: qty 1

## 2014-09-28 MED ORDER — ASPIRIN 300 MG RE SUPP
RECTAL | Status: AC
  Filled 2014-09-28: qty 1

## 2014-09-28 MED ORDER — ASPIRIN 300 MG RE SUPP
300.0000 mg | Freq: Every day | RECTAL | Status: DC
  Administered 2014-09-28 – 2014-10-01 (×3): 300 mg via RECTAL
  Filled 2014-09-28 (×8): qty 1

## 2014-09-28 NOTE — Progress Notes (Signed)
Patient had episode of choking and difficulty swallowing secretions.  Nurse notified and set patient upright in bed and oral suctioned.  MD and speech therapy notified.  Encouraged patient to stay upright in bed.  Will continue to monitor patient.

## 2014-09-28 NOTE — Progress Notes (Signed)
Rehab admissions - I received this referral from [place], case manager and have reviewed pt's case. Pt is a good candidate for inpatient rehab. I called and spoke with pt's son Eduard Clos ("Nate") and explained the possibility of inpatient rehab. Further questions were answered and he and his brothers are in full support of pt coming to inpt rehab. Nate will update his brothers and his mom as she was resting. I explained that we will need insurance authorization for admission to inpatient rehab.  Pt has [employer] and I have now opened her case with [employer] to seek insurance authorization. Clinicals were submitted to Petronila, Therapist, sports with [employer]   We will consider possible inpatient rehab pending her medical clearance, insurance authorization and our bed availability.  I will check on pt's status on Monday and have updated [place], case manager as well.  Please call me with any questions. Thanks.  Nanetta Batty, PT Rehabilitation Admissions Coordinator [phone]

## 2014-09-28 NOTE — Progress Notes (Signed)
[facility] PROGRESS NOTE  Madison Todd VKF:[id number] DOB: 1951-02-22 DOA: 09/27/2014 PCP: Maggie Font, MD  Assessment/Plan: Stroke  Risk factors diabetes, smoker, HTN, hyperlipidemia.  MRI with Acute RIGHT lateral medullary infarct, also with suspected inferior RIGHT lateral cerebellar hemisphere involvement. Some artifactual signal loss of the distal RIGHT vertebral, with suspected underlying 1 cm long 75% stenosis. The origin of the RIGHT PICA is poorly visualized but is probably diseased as well. Mild atrophy. Mild small vessel disease. No intracranial mass lesion is evident. Await echocardiogram carotid ultrasound Minimal to moderate amount of bilateral atherosclerotic plaque, leftsubjectively greater than right, not resulting in a hemodynamically significant stenosis. Hemoglobin A1c pending. Fasting lipid profile with HDL 36, LDL 136. Continue aspirin 81 mg by mouth daily. Start statin. Await PT/OT recommendations. She failed bedside swallow evaluation so await Speech Therapy eval.   Hypertension  SBP 155/70.Hold antihypertensive medications for permissive hypertension.  Hypokalemia  resolved after replacement. monitor  Diabetes mellitus Initiate sliding scale insulin.   Code Status: full Family Communication: none present Disposition Plan: home when ready   Consultants:  neurology  Procedures:    Antibiotics:  none  HPI/Subjective: Awake alert. Reports left arm numbness and right arm pain. Reports difficulty swallowing  Objective: Filed Vitals:   09/28/14 0915  BP: 155/74  Pulse: 59  Temp: 97.8 F (36.6 C)  Resp: 22   No intake or output data in the 24 hours ending 09/28/14 1034 Filed Weights   09/27/14 2142 09/28/14 0055  Weight: 68.947 kg (152 lb) 70.67 kg (155 lb 12.8 oz)    Exam:   General:  Well nourished appears comfortable  Cardiovascular: RRR No MGR No LE edema  Respiratory: normal effort BS coarse throughout fait diffuse  expiratory wheeze  Abdomen: soft non-distended +BS   Musculoskeletal: . Joints without swelling/erythema  Neuro: MAE. Bilateral grip 5/5, LE strength 5/5, speech slightly slurred, slight droop mouth, right eye lid droop. Oriented x3.    Data Reviewed: Basic Metabolic Panel:  Recent Labs Lab 09/27/14 2154 09/28/14 0547  NA 132* 133*  K 3.1* 3.7  CL 97 98  CO2 26 26  GLUCOSE 195* 269*  BUN 13 10  CREATININE 0.89 0.55  CALCIUM 9.1 8.9   Liver Function Tests:  Recent Labs Lab 09/27/14 2154  AST 66*  ALT 36*  ALKPHOS 139*  BILITOT 0.4  PROT 8.3  ALBUMIN 4.0   No results for input(s): LIPASE, AMYLASE in the last 168 hours. No results for input(s): AMMONIA in the last 168 hours. CBC:  Recent Labs Lab 09/27/14 2154  WBC 11.6*  NEUTROABS 7.0  HGB 14.6  HCT 44.3  MCV 89.3  PLT 298   Cardiac Enzymes: No results for input(s): CKTOTAL, CKMB, CKMBINDEX, TROPONINI in the last 168 hours. BNP (last 3 results) No results for input(s): PROBNP in the last 8760 hours. CBG:  Recent Labs Lab 09/28/14 0055 09/28/14 0912  GLUCAP 210* 271*    No results found for this or any previous visit (from the past 240 hour(s)).   Studies: Ct Head Wo Contrast  09/27/2014   CLINICAL DATA:  Dizziness, headache, difficulty swallowing  EXAM: CT HEAD WITHOUT CONTRAST  TECHNIQUE: Contiguous axial images were obtained from the base of the skull through the vertex without intravenous contrast.  COMPARISON:  None.  FINDINGS: Gray-white differentiation is maintained. No CT evidence acute large territory infarct. No intraparenchymal or extra-axial mass or hemorrhage. Normal size and configuration of the ventricles and basilar cisterns. No midline shift. Limited  visualization of the paranasal sinuses and mastoid air cells is normal. Regional soft tissues appear normal. No displaced calvarial fracture.  IMPRESSION: Negative noncontrast head CT.   Electronically Signed   By: Sandi Mariscal M.D.   On:  09/27/2014 22:48   Mr [name] Head Wo Contrast  09/28/2014   CLINICAL DATA:  Gait imbalance, leaning to the RIGHT side, with RIGHT-sided headache beginning 09/27/2014. Stroke risk factors include hypertension, diabetes, and vasculitis. Also history of breast cancer.  EXAM: MRI HEAD WITHOUT CONTRAST  MRA HEAD WITHOUT CONTRAST  TECHNIQUE: Multiplanar, multiecho pulse sequences of the brain and surrounding structures were obtained without intravenous contrast. Angiographic images of the head were obtained using MRA technique without contrast.  COMPARISON:  None.  CT head 09/27/2014.  FINDINGS: MRI HEAD FINDINGS  The patient was unable to remain motionless for the exam. Small or subtle lesions could be overlooked.  There is a moderate-sized area of restricted diffusion affecting the dorsal lateral medulla on the RIGHT, sparing the RIGHT cerebellar tonsil with suspected punctate areas of restricted diffusion in the RIGHT lateral inferior cerebellar hemisphere, consistent with an acute RIGHT vertebral PICA distribution infarct.  No visible hemorrhage, mass lesion, hydrocephalus, or extra-axial fluid. Mild cerebral and cerebellar atrophy. Mild subcortical and periventricular T2 and FLAIR hyperintensities, likely chronic microvascular ischemic change. Flow voids are maintained in the who carotid, vertebral, and basilar arteries. Pituitary, pineal, and cerebellar tonsils unremarkable. No upper cervical lesions.  Visualized calvarium, skull base, and upper cervical osseous structures unremarkable. Scalp and extracranial soft tissues, orbits, sinuses, and mastoids show no acute process.  MRA HEAD FINDINGS  The internal carotid arteries are widely patent. The basilar artery is widely patent. The LEFT vertebral is dominant and widely patent.  The RIGHT vertebral is non dominant. There is some artifactual signal loss in the mid V4 segment with a suspected underlying 1 cm 75% stenosis. The origin of the RIGHT PICA is poorly  visualized but is probably diseased, correlating with the observed pattern of cerebral infarction.  No proximal stenosis of the anterior or middle cerebral arteries. No proximal PCA disease. Mild irregularity of the distal MCA and PCA branches suggests intracranial atherosclerotic disease.  IMPRESSION: Acute RIGHT lateral medullary infarct, also with suspected inferior RIGHT lateral cerebellar hemisphere involvement.  Some artifactual signal loss of the distal RIGHT vertebral, with suspected underlying 1 cm long 75% stenosis. The origin of the RIGHT PICA is poorly visualized but is probably diseased as well.  Mild atrophy. Mild small vessel disease. No intracranial mass lesion is evident.   Electronically Signed   By: Rolla Flatten M.D.   On: 09/28/2014 09:10   Mri Brain Without Contrast  09/28/2014   CLINICAL DATA:  Gait imbalance, leaning to the RIGHT side, with RIGHT-sided headache beginning 09/27/2014. Stroke risk factors include hypertension, diabetes, and vasculitis. Also history of breast cancer.  EXAM: MRI HEAD WITHOUT CONTRAST  MRA HEAD WITHOUT CONTRAST  TECHNIQUE: Multiplanar, multiecho pulse sequences of the brain and surrounding structures were obtained without intravenous contrast. Angiographic images of the head were obtained using MRA technique without contrast.  COMPARISON:  None.  CT head 09/27/2014.  FINDINGS: MRI HEAD FINDINGS  The patient was unable to remain motionless for the exam. Small or subtle lesions could be overlooked.  There is a moderate-sized area of restricted diffusion affecting the dorsal lateral medulla on the RIGHT, sparing the RIGHT cerebellar tonsil with suspected punctate areas of restricted diffusion in the RIGHT lateral inferior cerebellar hemisphere, consistent with an  acute RIGHT vertebral PICA distribution infarct.  No visible hemorrhage, mass lesion, hydrocephalus, or extra-axial fluid. Mild cerebral and cerebellar atrophy. Mild subcortical and periventricular T2 and  FLAIR hyperintensities, likely chronic microvascular ischemic change. Flow voids are maintained in the who carotid, vertebral, and basilar arteries. Pituitary, pineal, and cerebellar tonsils unremarkable. No upper cervical lesions.  Visualized calvarium, skull base, and upper cervical osseous structures unremarkable. Scalp and extracranial soft tissues, orbits, sinuses, and mastoids show no acute process.  MRA HEAD FINDINGS  The internal carotid arteries are widely patent. The basilar artery is widely patent. The LEFT vertebral is dominant and widely patent.  The RIGHT vertebral is non dominant. There is some artifactual signal loss in the mid V4 segment with a suspected underlying 1 cm 75% stenosis. The origin of the RIGHT PICA is poorly visualized but is probably diseased, correlating with the observed pattern of cerebral infarction.  No proximal stenosis of the anterior or middle cerebral arteries. No proximal PCA disease. Mild irregularity of the distal MCA and PCA branches suggests intracranial atherosclerotic disease.  IMPRESSION: Acute RIGHT lateral medullary infarct, also with suspected inferior RIGHT lateral cerebellar hemisphere involvement.  Some artifactual signal loss of the distal RIGHT vertebral, with suspected underlying 1 cm long 75% stenosis. The origin of the RIGHT PICA is poorly visualized but is probably diseased as well.  Mild atrophy. Mild small vessel disease. No intracranial mass lesion is evident.   Electronically Signed   By: Rolla Flatten M.D.   On: 09/28/2014 09:10   US Carotid Bilateral  09/28/2014   CLINICAL DATA:  Hypertension, stroke, syncopal episode, hyperlipidemia, diabetes and smoking.  EXAM: BILATERAL CAROTID DUPLEX ULTRASOUND  TECHNIQUE: Pearline Cables scale imaging, color Doppler and duplex ultrasound were performed of bilateral carotid and vertebral arteries in the neck.  COMPARISON:  None.  FINDINGS: Criteria: Quantification of carotid stenosis is based on velocity parameters that  correlate the residual internal carotid diameter with NASCET-based stenosis levels, using the diameter of the distal internal carotid lumen as the denominator for stenosis measurement.  The following velocity measurements were obtained:  RIGHT  ICA:  52/17 cm/sec  CCA:  69/48 cm/sec  SYSTOLIC ICA/CCA RATIO:  0.7  DIASTOLIC ICA/CCA RATIO:  1.5  ECA:  92 cm/sec  LEFT  ICA:  69/21 cm/sec  CCA:  54/62 cm/sec  SYSTOLIC ICA/CCA RATIO:  1.4  DIASTOLIC ICA/CCA RATIO:  2.0  ECA:  62 cm/sec  RIGHT CAROTID ARTERY: There is a minimal amount of eccentric hypoechoic plaque within the right carotid bulb (image 16 and 17). There is a minimal of intimal thickening involving the origin and proximal aspects of the right internal carotid artery (image 25), not resulting in elevated peak systolic velocities with the interrogated course of the right internal carotid artery to suggest a hemodynamically significant stenosis.  RIGHT VERTEBRAL ARTERY:  Antegrade flow  LEFT CAROTID ARTERY: There is a minimal amount of eccentric mixed echogenic plaque within the left carotid bulb (image 51). There is a moderate amount of eccentric mixed echogenic plaque involving the origin and proximal aspect of the left internal carotid artery (image 59), not resulting in elevated peak systolic velocities with the interrogated course of the left internal carotid artery to suggest a hemodynamically significant stenosis.  LEFT VERTEBRAL ARTERY:  Antegrade flow  IMPRESSION: Minimal to moderate amount of bilateral atherosclerotic plaque, left subjectively greater than right, not resulting in a hemodynamically significant stenosis.   Electronically Signed   By: Sandi Mariscal M.D.   On: 09/28/2014  09:13    Scheduled Meds: .  stroke: mapping our early stages of recovery book   Does not apply Once  . antiseptic oral rinse  7 mL Mouth Rinse BID  . aspirin EC  81 mg Oral Daily  . aspirin  300 mg Rectal Daily  . enoxaparin (LOVENOX) injection  40 mg Subcutaneous  Q24H  . insulin aspart  0-9 Units Subcutaneous TID WC   Continuous Infusions:   Principal Problem:   CVA (cerebral infarction) Active Problems:   Invasive ductal carcinoma of left breast   Cardiomyopathy   COPD (chronic obstructive pulmonary disease)   TIA (transient ischemic attack)   HTN (hypertension)    Time spent: 40 minutes    [facility] Pager [phone]. If 7PM-7AM, please contact night-coverage at [email or web address], password [facility] 09/28/2014, 10:34 AM  LOS: 1 day

## 2014-09-28 NOTE — Progress Notes (Signed)
An attempt was made to work with pt for a 2nd time today.  During her initial evaluation she was very nauseated and was unable to mobilize much because of this.  She reports that she is no longer nauseated but very tired and "dizzy".  Her family was present including 3 sons.  Her MRI is positive for stroke in the medulla and cerebellum which supports the significant balance dysfunction found this morning.  She will definitely need to d/c to a Rehab setting and pt would be agreeable to CIR if accepted.  Her sons report that they could all work together to arrange care for her at home when d/c'd from that venue.  Because of her younger age, full independence prior to admission and good motivation I believe that CIR would be the better choice for this pt.  Marland Kitchen

## 2014-09-28 NOTE — Consult Note (Signed)
Hixton A. Merlene Laughter, MD     [email or web address]          Madison Todd is an 64 y.o. female.   ASSESSMENT/PLAN: 1. 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 Risk factors hypertension, diabetes and dyslipidemia along with age. The patient has severe dysphagia. Unfortunately, these types infarct involving the brainstem often have severe dysarthria which may take a long time to recover or not all. She is at high risk of aspiration. I suspect that she'll likely need PEG. Formal swallow evaluation is pending. Secondary stroke prevention with full dose aspirin is suggested. Risk factor modification with better diabetes control and also a notable trial of statin medication is recommended.  The patient is a 64 year old black female who had the acute onset of severe maximum onset thunderclap headache involving the right temporofrontal region. She reports not feeling well and decided to seek medical attention. In the emergency room, she was noted to have difficulties walking and leaning towards one side. She was therefore admitted for further evaluation for the possibility of having acute stroke. Initial CT scan was negative. She reports having some visual symptoms but denies diplopia. She has been having difficulty swallowing her saliva and coughing quite a bit. She reports that the right side is weak and does not feel right. She denies chest pain or shortness of breath at this time. She has been having some coughing with her swallowing problems. The review systems otherwise negative.  GENERAL: She is sleeping but easily arousable.  HEENT: Supple. Atraumatic normocephalic. She does have increased secretions.  ABDOMEN: soft  EXTREMITIES: No edema   BACK: Normal.  SKIN: Normal by inspection.    MENTAL STATUS: Alert and oriented - including age 34 and month January. Speech, language and cognition  are generally intact. Judgment and insight normal. She follows commands well.  CRANIAL NERVES: Pupils are round and reactive to light and accommodation - right is 3 mm and left 4-1/2 mm; extra ocular movements are full, there is no significant nystagmus; visual fields are full; there is a moderate ptosis on the right; other muscle groups however the face are essentially symmetric and normal other than for the nasolabial fold mentioned below; there is flattening of the nasolabial fold -R; tongue slightly deviates to the right; uvula is paretic on the right side; shoulder elevation is normal.  MOTOR: Normal tone, bulk and strength; no pronator drift. No drift of the upper and lower extremities.  COORDINATION: There is rather marked dysmetria of the right upper extremity. There is moderate dysmetria of the right leg. The left side is normal. No tremors, rigidity, bradykinesia or parkinsonism observed.  REFLEXES: Deep tendon reflexes are symmetrical and normal but diminished in the legs. Babinski reflexes are flexor bilaterally.   SENSATION: Normal to light touch. No extinction to double simultaneous stimulation both tactilely or visually.  NIH stroke scale 3.  Brain MRI is reviewed in person. There is acute infarct with increased signal on diffusion imaging seen in the lateral medullary area on the right side. MRA shows the left vertebral is dominant but there is no stenosis that is significant andthe stenosis has significant other vascular region. The anterior circulation is relatively unremarkable.      Blood pressure 143/68, pulse 64, temperature 97.9 F (36.6 C), temperature source Oral, resp. rate 20, height 5' 4"  (1.626 m), weight 70.67 kg (155 lb 12.8 oz), SpO2 96 %.  Past Medical History  Diagnosis  Date  . Leukocytoclastic vasculitis   . Diabetes mellitus   . Breast cancer     left 2007/right 1997  . Left-sided Breast cancer 07/22/2011  . Cardiomyopathy 07/22/2011  . COPD (chronic  obstructive pulmonary disease) 07/22/2011  . Hypertension   . Hyperlipidemia   . 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  Stage II (T2 N0).  ER 73%, PR 90%, Her2 positive, Ki-67 19%.  2.1 cm in size.  Agreed to only have radiation, Herceptin, and Aromasin.  Right sided breast cancer: Stage II (T2 N0 M0).  2.5 cm in size.  Surgery on 04/28/1996 with axillary dissection on 05/10/1996.  11 negative nodes.  Treated with AC x 4 cycles for ER negative, PR positive at only 20%.    . Breast cancer, right breast 12/16/2013    Past Surgical History  Procedure Laterality Date  . Mastectomy, radical      right  . Breast lumpectomy      left  . Port-a-cath removal    . Colonoscopy  07/20/2005    RMR: Diminutive ascending colon polyp, status post resection . Inflamed focally adenomatous polyp  . Colonoscopy N/A 10/05/2013    RMR: Multiple rectal and colonic polyps-removed/treated. 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 other polyps tubular adenomas  . Flexible sigmoidoscopy N/A 10/11/2013    RMR: rectal polypectomy site identified, assitional resection and tatooing performed. 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 Flexible sigmoidoscopy N/A 11/24/2013    Procedure: FLEXIBLE SIGMOIDOSCOPY;  Surgeon: Daneil Dolin, MD;  Location: [facility];  Service: Endoscopy;  Laterality: N/A;  1:00-moved to [place] notified pt    Family History  Problem Relation Age of Onset  . Diabetes Father   . Cancer Maternal Aunt   . Cancer Paternal Aunt   . Colon cancer Neg Hx     Social History:  reports that she has been smoking Cigarettes.  She has a 9 pack-year smoking history. She has never used smokeless tobacco. She reports that she does not drink alcohol or use illicit drugs.  Allergies:  Allergies  Allergen Reactions  . Lisinopril Other (See Comments)    Tongue swelling  . Aromasin [Exemestane] Rash     Medications: Prior to Admission medications   Medication Sig Start Date End Date Taking? Authorizing Provider  acetaminophen (TYLENOL) 325 MG tablet Take 325 mg by mouth at bedtime as needed (help relax, sleep).    Yes Historical Provider, MD  aspirin EC 81 MG tablet Take 81 mg by mouth daily.   Yes Historical Provider, MD  cholecalciferol (VITAMIN D) 1000 UNITS tablet Take 1,000 Units by mouth daily.   Yes Historical Provider, MD  ibuprofen (ADVIL,MOTRIN) 200 MG tablet Take 200 mg by mouth every 6 (six) hours as needed for mild pain or moderate pain.   Yes Historical Provider, MD  metFORMIN (GLUCOPHAGE-XR) 500 MG 24 hr tablet Take 500 mg by mouth daily with breakfast.     Yes Historical Provider, MD  metoprolol (TOPROL-XL) 50 MG 24 hr tablet Take 50 mg by mouth daily.     Yes Historical Provider, MD  Olmesartan-Amlodipine-HCTZ (TRIBENZOR) 40-5-12.5 MG TABS Take 1 tablet by mouth daily.     Yes Historical Provider, MD    Scheduled Meds: . antiseptic oral rinse  7 mL Mouth Rinse BID  . aspirin EC  81 mg Oral Daily  . aspirin  300 mg  Rectal Daily  . enoxaparin (LOVENOX) injection  40 mg Subcutaneous Q24H  . insulin aspart  0-15 Units Subcutaneous TID WC  . insulin aspart  0-5 Units Subcutaneous QHS   Continuous Infusions:  PRN Meds:.acetaminophen, ondansetron (ZOFRAN) IV     Results for orders placed or performed during the hospital encounter of 09/27/14 (from the past 48 hour(s))  Ethanol     Status: None   Collection Time: 09/27/14  9:54 PM  Result Value Ref Range   Alcohol, Ethyl (B) <5 0 - 9 mg/dL    Comment:        LOWEST DETECTABLE LIMIT FOR SERUM ALCOHOL IS 11 mg/dL FOR MEDICAL PURPOSES ONLY   CBC     Status: Abnormal   Collection Time: 09/27/14  9:54 PM  Result Value Ref Range   WBC 11.6 (H) 4.0 - 10.5 K/uL   RBC 4.96 3.87 - 5.11 MIL/uL   Hemoglobin 14.6 12.0 - 15.0 g/dL   HCT 44.3 36.0 - 46.0 %   MCV 89.3 78.0 - 100.0 fL   MCH 29.4 26.0 - 34.0 pg   MCHC 33.0  30.0 - 36.0 g/dL   RDW 14.2 11.5 - 15.5 %   Platelets 298 150 - 400 K/uL  Differential     Status: None   Collection Time: 09/27/14  9:54 PM  Result Value Ref Range   Neutrophils Relative % 61 43 - 77 %   Neutro Abs 7.0 1.7 - 7.7 K/uL   Lymphocytes Relative 32 12 - 46 %   Lymphs Abs 3.7 0.7 - 4.0 K/uL   Monocytes Relative 6 3 - 12 %   Monocytes Absolute 0.7 0.1 - 1.0 K/uL   Eosinophils Relative 1 0 - 5 %   Eosinophils Absolute 0.2 0.0 - 0.7 K/uL   Basophils Relative 0 0 - 1 %   Basophils Absolute 0.0 0.0 - 0.1 K/uL  Comprehensive metabolic panel     Status: Abnormal   Collection Time: 09/27/14  9:54 PM  Result Value Ref Range   Sodium 132 (L) 135 - 145 mmol/L    Comment: Please note change in reference range.   Potassium 3.1 (L) 3.5 - 5.1 mmol/L    Comment: Please note change in reference range.   Chloride 97 96 - 112 mEq/L   CO2 26 19 - 32 mmol/L   Glucose, Bld 195 (H) 70 - 99 mg/dL   BUN 13 6 - 23 mg/dL   Creatinine, Ser 0.89 0.50 - 1.10 mg/dL   Calcium 9.1 8.4 - 10.5 mg/dL   Total Protein 8.3 6.0 - 8.3 g/dL   Albumin 4.0 3.5 - 5.2 g/dL   AST 66 (H) 0 - 37 U/L   ALT 36 (H) 0 - 35 U/L   Alkaline Phosphatase 139 (H) 39 - 117 U/L   Total Bilirubin 0.4 0.3 - 1.2 mg/dL   GFR calc non Af Amer 67 (L) >90 mL/min   GFR calc Af Amer 77 (L) >90 mL/min    Comment: (NOTE) The eGFR has been calculated using the CKD EPI equation. This calculation has not been validated in all clinical situations. eGFR's persistently <90 mL/min signify possible Chronic Kidney Disease.    Anion gap 9 5 - 15  I-Stat Troponin, ED (not at [facility])     Status: None   Collection Time: 09/27/14 10:01 PM  Result Value Ref Range   Troponin i, poc 0.00 0.00 - 0.08 ng/mL   Comment 3  Comment: Due to the release kinetics of cTnI, a negative result within the first hours of the onset of symptoms does not rule out myocardial infarction with certainty. If myocardial infarction is still  suspected, repeat the test at appropriate intervals.   I-STAT, chem 8     Status: Abnormal   Collection Time: 09/27/14 10:04 PM  Result Value Ref Range   Sodium 138 135 - 145 mmol/L   Potassium 4.0 3.5 - 5.1 mmol/L   Chloride 98 96 - 112 mEq/L   BUN 15 6 - 23 mg/dL   Creatinine, Ser 0.80 0.50 - 1.10 mg/dL   Glucose, Bld 199 (H) 70 - 99 mg/dL   Calcium, Ion 0.98 (L) 1.13 - 1.30 mmol/L   TCO2 25 0 - 100 mmol/L   Hemoglobin 17.0 (H) 12.0 - 15.0 g/dL   HCT 50.0 (H) 36.0 - 46.0 %  Protime-INR     Status: None   Collection Time: 09/27/14 10:40 PM  Result Value Ref Range   Prothrombin Time 13.3 11.6 - 15.2 seconds   INR 1.00 0.00 - 1.49  APTT     Status: None   Collection Time: 09/27/14 10:40 PM  Result Value Ref Range   aPTT 29 24 - 37 seconds  Urine Drug Screen     Status: None   Collection Time: 09/27/14 10:45 PM  Result Value Ref Range   Opiates NONE DETECTED NONE DETECTED   Cocaine NONE DETECTED NONE DETECTED   Benzodiazepines NONE DETECTED NONE DETECTED   Amphetamines NONE DETECTED NONE DETECTED   Tetrahydrocannabinol NONE DETECTED NONE DETECTED   Barbiturates NONE DETECTED NONE DETECTED    Comment:        DRUG SCREEN FOR MEDICAL PURPOSES ONLY.  IF CONFIRMATION IS NEEDED FOR ANY PURPOSE, NOTIFY LAB WITHIN 5 DAYS.        LOWEST DETECTABLE LIMITS FOR URINE DRUG SCREEN Drug Class       Cutoff (ng/mL) Amphetamine      1000 Barbiturate      200 Benzodiazepine   622 Tricyclics       633 Opiates          300 Cocaine          300 THC              50   Urinalysis, Routine w reflex microscopic     Status: None   Collection Time: 09/27/14 10:45 PM  Result Value Ref Range   Color, Urine YELLOW YELLOW   APPearance CLEAR CLEAR   Specific Gravity, Urine 1.020 1.005 - 1.030   pH 7.0 5.0 - 8.0   Glucose, UA NEGATIVE NEGATIVE mg/dL   Hgb urine dipstick NEGATIVE NEGATIVE   Bilirubin Urine NEGATIVE NEGATIVE   Ketones, ur NEGATIVE NEGATIVE mg/dL   Protein, ur NEGATIVE NEGATIVE  mg/dL   Urobilinogen, UA 1.0 0.0 - 1.0 mg/dL   Nitrite NEGATIVE NEGATIVE   Leukocytes, UA NEGATIVE NEGATIVE    Comment: MICROSCOPIC NOT DONE ON URINES WITH NEGATIVE PROTEIN, BLOOD, LEUKOCYTES, NITRITE, OR GLUCOSE <1000 mg/dL.  Hemoglobin A1c     Status: Abnormal   Collection Time: 09/28/14 12:31 AM  Result Value Ref Range   Hgb A1c MFr Bld 8.9 (H) <5.7 %    Comment: (NOTE)  According to the ADA Clinical Practice Recommendations for 2011, when HbA1c is used as a screening test:  >=6.5%   Diagnostic of Diabetes Mellitus           (if abnormal result is confirmed) 5.7-6.4%   Increased risk of developing Diabetes Mellitus References:Diagnosis and Classification of Diabetes Mellitus,Diabetes SELT,5320,23(XIDHW 1):S62-S69 and Standards of Medical Care in         Diabetes - 2011,Diabetes YSHU,8372,90 (Suppl 1):S11-S61.    Mean Plasma Glucose 209 (H) <117 mg/dL    Comment: Performed at [facility]  Glucose, capillary     Status: Abnormal   Collection Time: 09/28/14 12:55 AM  Result Value Ref Range   Glucose-Capillary 210 (H) 70 - 99 mg/dL  Fasting lipid panel     Status: Abnormal   Collection Time: 09/28/14  5:47 AM  Result Value Ref Range   Cholesterol 184 0 - 200 mg/dL   Triglycerides 62 <150 mg/dL   HDL 36 (L) >39 mg/dL   Total CHOL/HDL Ratio 5.1 RATIO   VLDL 12 0 - 40 mg/dL   LDL Cholesterol 136 (H) 0 - 99 mg/dL    Comment:        Total Cholesterol/HDL:CHD Risk Coronary Heart Disease Risk Table                     Men   Women  1/2 Average Risk   3.4   3.3  Average Risk       5.0   4.4  2 X Average Risk   9.6   7.1  3 X Average Risk  23.4   11.0        Use the calculated Patient Ratio above and the CHD Risk Table to determine the patient's CHD Risk.        ATP III CLASSIFICATION (LDL):  <100     mg/dL   Optimal  100-129  mg/dL   Near or Above                    Optimal  130-159  mg/dL    Borderline  160-189  mg/dL   High  >190     mg/dL   Very High   Basic metabolic panel     Status: Abnormal   Collection Time: 09/28/14  5:47 AM  Result Value Ref Range   Sodium 133 (L) 135 - 145 mmol/L    Comment: Please note change in reference range.   Potassium 3.7 3.5 - 5.1 mmol/L    Comment: Please note change in reference range.   Chloride 98 96 - 112 mEq/L   CO2 26 19 - 32 mmol/L   Glucose, Bld 269 (H) 70 - 99 mg/dL   BUN 10 6 - 23 mg/dL   Creatinine, Ser 0.55 0.50 - 1.10 mg/dL    Comment: DELTA CHECK NOTED   Calcium 8.9 8.4 - 10.5 mg/dL   GFR calc non Af Amer >90 >90 mL/min   GFR calc Af Amer >90 >90 mL/min    Comment: (NOTE) The eGFR has been calculated using the CKD EPI equation. This calculation has not been validated in all clinical situations. eGFR's persistently <90 mL/min signify possible Chronic Kidney Disease.    Anion gap 9 5 - 15  Glucose, capillary     Status: Abnormal   Collection Time: 09/28/14  9:12 AM  Result Value Ref Range   Glucose-Capillary 271 (H) 70 - 99 mg/dL  Glucose, capillary  Status: Abnormal   Collection Time: 09/28/14 11:41 AM  Result Value Ref Range   Glucose-Capillary 231 (H) 70 - 99 mg/dL  Glucose, capillary     Status: Abnormal   Collection Time: 09/28/14  4:53 PM  Result Value Ref Range   Glucose-Capillary 182 (H) 70 - 99 mg/dL   Comment 1 Notify RN    Comment 2 Documented in Chart     Studies/Results:  BRAIN MRI/MRA  The patient was unable to remain motionless for the exam. Small or subtle lesions could be overlooked.  There is a moderate-sized area of restricted diffusion affecting the dorsal lateral medulla on the RIGHT, sparing the RIGHT cerebellar tonsil with suspected punctate areas of restricted diffusion in the RIGHT lateral inferior cerebellar hemisphere, consistent with an acute RIGHT vertebral PICA distribution infarct.  No visible hemorrhage, mass lesion, hydrocephalus, or extra-axial fluid. Mild  cerebral and cerebellar atrophy. Mild subcortical and periventricular T2 and FLAIR hyperintensities, likely chronic microvascular ischemic change. Flow voids are maintained in the who carotid, vertebral, and basilar arteries. Pituitary, pineal, and cerebellar tonsils unremarkable. No upper cervical lesions.  Visualized calvarium, skull base, and upper cervical osseous structures unremarkable. Scalp and extracranial soft tissues, orbits, sinuses, and mastoids show no acute process.  MRA HEAD FINDINGS  The internal carotid arteries are widely patent. The basilar artery is widely patent. The LEFT vertebral is dominant and widely patent.  The RIGHT vertebral is non dominant. There is some artifactual signal loss in the mid V4 segment with a suspected underlying 1 cm 75% stenosis. The origin of the RIGHT PICA is poorly visualized but is probably diseased, correlating with the observed pattern of cerebral infarction.  No proximal stenosis of the anterior or middle cerebral arteries. No proximal PCA disease. Mild irregularity of the distal MCA and PCA branches suggests intracranial atherosclerotic disease.  CAROTID DOPPLER NORMAL   ECHO - Left ventricle: The cavity size was normal. Wall thickness was increased in a pattern of mild LVH. Systolic function was normal. The estimated ejection fraction was in the range of 60% to 65%. Wall motion was normal; there were no regional wall motion abnormalities. Doppler parameters are consistent with abnormal left ventricular relaxation (grade 1 diastolic dysfunction). - Aortic valve: Mildly calcified annulus. Trileaflet. - Mitral valve: Mildly thickened leaflets . There was trivial regurgitation. - Right atrium: Central venous pressure (est): 3 mm Hg. - Atrial septum: No defect or patent foramen ovale was identified. - Tricuspid valve: There was trivial regurgitation. - Pulmonary arteries: Systolic pressure could not be  accurately estimated. - Pericardium, extracardiac: There was no pericardial effusion.  Jerlisa Diliberto A. Merlene Laughter, M.D.  Diplomate, Tax adviser of Psychiatry and Neurology ( Neurology). 09/28/2014, 8:10 PM

## 2014-09-28 NOTE — Evaluation (Signed)
Clinical/Bedside Swallow Evaluation Patient Details  Name: Madison Todd MRN: [id number] Date of Birth: 10/22/1950  Today's Date: 09/28/2014 Time: 1740-1800 SLP Time Calculation (min) (ACUTE ONLY): 20 min  Past Medical History:  Past Medical History  Diagnosis Date  . Leukocytoclastic vasculitis   . Diabetes mellitus   . Breast cancer     left 2007/right 1997  . Left-sided Breast cancer 07/22/2011  . Cardiomyopathy 07/22/2011  . COPD (chronic obstructive pulmonary disease) 07/22/2011  . Hypertension   . Hyperlipidemia   . 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  Stage II (T2 N0).  ER 73%, PR 90%, Her2 positive, Ki-67 19%.  2.1 cm in size.  Agreed to only have radiation, Herceptin, and Aromasin.  Right sided breast cancer: Stage II (T2 N0 M0).  2.5 cm in size.  Surgery on 04/28/1996 with axillary dissection on 05/10/1996.  11 negative nodes.  Treated with AC x 4 cycles for ER negative, PR positive at only 20%.    . Breast cancer, right breast 12/16/2013   Past Surgical History:  Past Surgical History  Procedure Laterality Date  . Mastectomy, radical      right  . Breast lumpectomy      left  . Port-a-cath removal    . Colonoscopy  07/20/2005    RMR: Diminutive ascending colon polyp, status post resection . Inflamed focally adenomatous polyp  . Colonoscopy N/A 10/05/2013    RMR: Multiple rectal and colonic polyps-removed/treated. 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 other polyps tubular adenomas  . Flexible sigmoidoscopy N/A 10/11/2013    RMR: rectal polypectomy site identified, assitional resection and tatooing performed. 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 Flexible sigmoidoscopy N/A 11/24/2013    Procedure: FLEXIBLE SIGMOIDOSCOPY;  Surgeon: Daneil Dolin, MD;  Location: [facility];  Service: Endoscopy;  Laterality: N/A;  1:00-moved to [address] notified pt   HPI:  12 old female  who  has a past medical history of Leukocytoclastic vasculitis; Diabetes mellitus; Breast cancer; Left-sided Breast cancer (07/22/2011); Cardiomyopathy (07/22/2011); COPD (chronic obstructive pulmonary disease) (07/22/2011); Hypertension; Hyperlipidemia; Invasive ductal carcinoma of left breast (07/22/2011); and Breast cancer, right breast (12/16/2013). Admitted to ED 1/14 after having R side headache and gait imbalance. 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 Pt failed stroke swallow screen; bedside swallow eval ordered.   Assessment / Plan / Recommendation Clinical Impression  The pt is currently presenting with a severe pharyngeal dysphagia with total inability to initiate pharyngeal swallow. Pt is unable to manage secretions but is independently utilizing oral suction; cognition appears unaffected as pt is able to easily follow commands and carry on conversation. No pharyngeal swallow response with ice chips or small amount of puree. Currently at high risk of aspiration wtih all PO intake. Recommend that pt remain strictly NPO at this time, meds via alternative means, providing frequent oral care; have oral suction available at all times. Given that this is a lateral medullary stroke, pt may have a long-term dysphagia. Will continue to follow closely to reassess PO readiness.     Aspiration Risk  Severe    Diet Recommendation NPO   Medication Administration: Via alternative means    Other  Recommendations Oral Care Recommendations: Oral care Q4 per protocol Other Recommendations: Have oral suction available   Follow Up Recommendations  Inpatient Rehab    Frequency and  Duration min 3x week  2 weeks   Pertinent Vitals/Pain n/a    SLP Swallow Goals     Swallow Study Prior Functional Status  Type of Home: House Available Help at Discharge: Family    General HPI: 78 old female who  has a past medical history of  Leukocytoclastic vasculitis; Diabetes mellitus; Breast cancer; Left-sided Breast cancer (07/22/2011); Cardiomyopathy (07/22/2011); COPD (chronic obstructive pulmonary disease) (07/22/2011); Hypertension; Hyperlipidemia; Invasive ductal carcinoma of left breast (07/22/2011); and Breast cancer, right breast (12/16/2013). Admitted to ED 1/14 after having R side headache and gait imbalance. 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 Pt failed stroke swallow screen; bedside swallow eval ordered. Type of Study: Bedside swallow evaluation Diet Prior to this Study: NPO Temperature Spikes Noted: No Respiratory Status: Room air History of Recent Intubation: No Behavior/Cognition: Alert;Cooperative Oral Cavity - Dentition: Adequate natural dentition Self-Feeding Abilities: Able to feed self Patient Positioning: Upright in bed Baseline Vocal Quality: Clear Volitional Cough: Strong Volitional Swallow: Unable to elicit    Oral/Motor/Sensory Function Overall Oral Motor/Sensory Function: Impaired Labial ROM: Reduced left Labial Symmetry: Abnormal symmetry left Labial Strength: Reduced Labial Sensation: Reduced Lingual ROM: Within Functional Limits Lingual Symmetry: Other (Comment) (deviates to R side) Lingual Strength: Within Functional Limits Lingual Sensation: Reduced   Ice Chips Ice chips: Impaired Presentation: Spoon Oral Phase Impairments: Reduced labial seal Pharyngeal Phase Impairments: Unable to trigger swallow;Cough - Immediate   Thin Liquid Thin Liquid: Not tested    Nectar Thick Nectar Thick Liquid: Not tested   Honey Thick Honey Thick Liquid: Not tested   Puree Puree: Impaired Presentation: Spoon Oral Phase Impairments: Reduced labial seal Pharyngeal Phase Impairments: Unable to trigger swallow   Solid   GO    Solid: Not tested       Kern Reap, MA, CCC-SLP 09/28/2014,6:08 PM

## 2014-09-28 NOTE — Evaluation (Signed)
Physical Therapy Evaluation Patient Details Name: Madison Todd MRN: [id number] DOB: 29-Aug-1951 Today's Date: 09/28/2014   History of Present Illness  57 old female who  has a past medical history of Leukocytoclastic vasculitis; Diabetes mellitus; Breast cancer; Left-sided Breast cancer (07/22/2011); Cardiomyopathy (07/22/2011); COPD (chronic obstructive pulmonary disease) (07/22/2011); Hypertension; Hyperlipidemia; Invasive ductal carcinoma of left breast (07/22/2011); and Breast cancer, right breast (12/16/2013). Patient was brought to the ED after patient started having right-sided headache which improved after taking Tylenol, but patient noticed that she was having gait imbalance on walking, and she was leaning towards right side. Though she denies slurred speech, no weakness of extremities, did not pass out no blurred vision. Patient's symptoms resolved when she reached ED. Denies headache at this time. No chest pain shortness of breath. CT of the head was done in the ED, which was negative for stroke. Patient does take aspirin 81 mg by mouth daily.  Clinical Impression  Patient received side-lying in bed, fairly nauseated and fatigued but willing to participate in skilled PT services at this time. Patient states that she has right sided weakness but upon examination by PT, patient revealed the ability to perform full AROM of R leg in both gravity eliminated and gravity present positions. Rolling and scooting in bed with independence, supine to sit with Mod(A); patient demonstrates impaired trunk control sitting EOB and requires constant Min guard to Min(A) to maintain upright sitting balance at this time. Balance Todd noted to R and posteriorly although patient was able to stabilize at EOB. Attempted sit to stand with Mod(A) and FWW, able to clear ischial tuberosities from bed but unable to come to full stand due to nausea, fatigue, dizziness, and impaired trunk control on this date. Patient  education regarding importance of participating in activity and upright tasks to assist in functional progression of mobility skills; education also regarding typical course of recovery following stroke. Unsure if patient most appropriate for SNF or inpatient rehabilitation settings at this time- PT to follow up and determine course of care during upcoming treatment sessions. Patient left supine in bed with bed alarm activated and all needs otherwise met.     Follow Up Recommendations Other (comment) (SNF versus inpatient- unable to determine at this time)    Equipment Recommendations  None recommended by PT    Recommendations for Other Services OT consult     Precautions / Restrictions Precautions Precautions: Fall Restrictions Weight Bearing Restrictions: No      Mobility  Bed Mobility Overal bed mobility: Needs Assistance Bed Mobility: Rolling;Sidelying to Sit Rolling: Modified independent (Device/Increase time) Sidelying to sit: Mod assist       General bed mobility comments: Patient able to perform rolling and scooting up/over in bed without difficulty; able to complete partial supine to sit but requires assist to complete latter half of transfer  Transfers Overall transfer level: Needs assistance Equipment used: Rolling walker (2 wheeled)   Sit to Stand: Mod assist         General transfer comment: Attempted sit to stand with Mod(A); unable to fully complete on this date in part due to nausea, dizzyness, impaired trunk control  Ambulation/Gait             General Gait Details: Not assessed at this time  Stairs            Wheelchair Mobility    Modified Rankin (Stroke Patients Only)       Balance Overall balance assessment: Needs assistance Sitting-balance support: Bilateral  upper extremity supported Sitting balance-Leahy Scale: Poor Sitting balance - Comments: Patient required constant Min guard to Min(A) to maintain upright sitting at EOB   Postural control: Right lateral lean;Posterior lean Standing balance support: Bilateral upper extremity supported Standing balance-Leahy Scale: Poor Standing balance comment: Unable to come to full stand during evaluation due to nausea, dizzyness, high levels of fatigue                             Pertinent Vitals/Pain Pain Assessment: No/denies pain    Home Living Family/patient expects to be discharged to:: Private residence Living Arrangements: Other relatives Available Help at Discharge: Other (Comment) (Patient states that 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, and also has an aide to assist her with her aunt's care) Type of Home: House Home Access: Stairs to enter Entrance Stairs-Rails:  (unknown) Entrance Stairs-Number of Steps: 1 in back, 3 in front Home Layout: One level Home Equipment: None Additional Comments: Patient does not appear to have any DME for use at home    Prior Function Level of Independence: Independent               Hand Dominance        Extremity/Trunk Assessment               Lower Extremity Assessment: Overall WFL for tasks assessed (R side is weaker than L; however patient is able to perform all AROM with R leg in both gravity eliminated and gravity present positions)         Communication   Communication: No difficulties  Cognition Arousal/Alertness: Awake/alert Behavior During Therapy: WFL for tasks assessed/performed Overall Cognitive Status: Within Functional Limits for tasks assessed                      General Comments      Exercises        Assessment/Plan    PT Assessment Patient needs continued PT services  PT Diagnosis Difficulty walking;Abnormality of gait;Generalized weakness;Other (comment)   PT Problem List Decreased strength;Decreased coordination;Decreased activity tolerance;Decreased knowledge of use of DME;Decreased balance;Decreased safety awareness;Decreased mobility   PT Treatment Interventions DME instruction;Therapeutic exercise;Gait training;Balance training;Stair training;Neuromuscular re-education;Functional mobility training;Patient/family education;Therapeutic activities   PT Goals (Current goals can be found in the Care Plan section) Acute Rehab PT Goals Patient Stated Goal: none PT Goal Formulation: With patient Time For Goal Achievement: 10/12/14 Potential to Achieve Goals: Fair    Frequency Min 5X/week   Barriers to discharge Other (comment) Pt unable to return home at this time due to severity of physical impairments noted upon evaluation    Co-evaluation               End of Session Equipment Utilized During Treatment: Gait belt Activity Tolerance: Patient limited by fatigue;Other (comment) (Limited by nausea and dizzyness) Patient left: in bed;with call bell/phone within reach;with bed alarm set      Functional Assessment Tool Used: based on clinical assessment and analysis of patient's mobility levels Functional Limitation: Mobility: Walking and moving around Mobility: Walking and Moving Around Current Status (P1898): At least 60 percent but less than 80 percent impaired, limited or restricted Mobility: Walking and Moving Around Goal Status [phone]): At least 1 percent but less than 20 percent impaired, limited or restricted    Time: 1000-1020 PT Time Calculation (min) (ACUTE ONLY): 20 min   Charges:   PT Evaluation $Initial PT  Evaluation Tier I: 1 Procedure     PT G Codes:   PT G-Codes **NOT FOR INPATIENT CLASS** Functional Assessment Tool Used: based on clinical assessment and analysis of patient's mobility levels Functional Limitation: Mobility: Walking and moving around Mobility: Walking and Moving Around Current Status (Q3300): At least 60 percent but less than 80 percent impaired, limited or restricted Mobility: Walking and Moving Around Goal Status [phone]): At least 1 percent but less than 20 percent impaired,  limited or restricted    Hunt Oris PT, DPT 09/28/2014, 10:49 AM

## 2014-09-28 NOTE — Progress Notes (Signed)
  Echocardiogram 2D Echocardiogram has been performed.  Lysle Rubens 09/28/2014, 1:12 PM

## 2014-09-28 NOTE — Evaluation (Signed)
Occupational Therapy Evaluation Patient Details Name: Madison Todd MRN: [id number] DOB: Mar 12, 1951 Today's Date: 09/28/2014    History of Present Illness 29 old female who  has a past medical history of Leukocytoclastic vasculitis; Diabetes mellitus; Breast cancer; Left-sided Breast cancer (07/22/2011); Cardiomyopathy (07/22/2011); COPD (chronic obstructive pulmonary disease) (07/22/2011); Hypertension; Hyperlipidemia; Invasive ductal carcinoma of left breast (07/22/2011); and Breast cancer, right breast (12/16/2013). Patient was brought to the ED after patient started having right-sided headache which improved after taking Tylenol, but patient noticed that she was having gait imbalance on walking, and she was leaning towards right side. Though she denies slurred speech, no weakness of extremities, did not pass out no blurred vision. Patient's symptoms resolved when she reached ED. Denies headache at this time. No chest pain shortness of breath. CT of the head was done in the ED, which was negative for stroke.  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 Pt currently with functional limitations due to the deficits listed below (see OT Problem List). Patient with visual, gmc, and balance deficits affecting ability to complete B/IADLs. Pt will benefit from skilled OT to increase their safety and independence with ADL and functional mobility for ADL to facilitate discharge to venue listed below.       Follow Up Recommendations  CIR    Equipment Recommendations       Recommendations for Other Services Rehab consult     Precautions / Restrictions Precautions Precautions: Fall;Other (comment) (aspiration - patient set up with suction) Restrictions Weight Bearing Restrictions: No      Mobility                                       Balance Overall balance assessment: Needs assistance Sitting-balance  support: No upper extremity supported Sitting balance-Leahy Scale: Poor Sitting balance - Comments: patient sat forward in bed from [facility] raised position.  Patient leaning to right, however, able to correct to near midline with increased time to complete.   Postural control: Right lateral lean;Posterior lean                                  ADL                                         General ADL Comments: able to simulate washing her face and hands independently after setup.  Patient demonstrating fair - to poor dynamic sitting balance, will need extensive assistance with lower extremity ADLs.     Vision                 Additional Comments: able to follow OT with bilateral eyes through all visual quadrants.  attempted to test each eye independently, patient too fatigued to participate.   Perception Perception Perception Tested?: No   Praxis      Pertinent Vitals/Pain Pain Assessment: No/denies pain     Hand Dominance Right   Extremity/Trunk Assessment Upper Extremity Assessment RUE Deficits / Details: Patient has WFL AROM and 4/5 strength in RUE.  Definite gross motor deficits noted with overreaching during nose to finger test, and difficulty with smooth coordinated movements when asked to  touch her head and face. RUE Sensation: decreased proprioception (complaining of numbness and tingling.) RUE Coordination: decreased gross motor LUE Deficits / Details: LUE AROM and strength were WFL.   Lower Extremity Assessment Lower Extremity Assessment: Defer to PT evaluation       Communication Communication Communication: No difficulties   Cognition Arousal/Alertness: Awake/alert Behavior During Therapy: WFL for tasks assessed/performed Overall Cognitive Status: Within Functional Limits for tasks assessed                     General Comments       Exercises       Shoulder Instructions      Home Living Family/patient expects  to be discharged to:: Private residence Living Arrangements: Other relatives Available Help at Discharge: Family Type of Home: House Home Access: Stairs to enter [employer] of Steps: 1 in back, 3 in front   Home Layout: One level               Home Equipment: None   Additional Comments: Patient does not appear to have any DME for use at home      Prior Functioning/Environment Level of Independence: Independent             OT Diagnosis: Disturbance of vision;Apraxia   OT Problem List: Decreased strength;Decreased activity tolerance;Impaired balance (sitting and/or standing);Decreased coordination;Impaired vision/perception;Impaired UE functional use   OT Treatment/Interventions: Self-care/ADL training;Therapeutic exercise;Neuromuscular education;Manual therapy;Patient/family education;Visual/perceptual remediation/compensation;Therapeutic activities;Balance training    OT Goals(Current goals can be found in the care plan section) Acute Rehab OT Goals Patient Stated Goal: none Time For Goal Achievement: 10/12/14 Potential to Achieve Goals: Fair  OT Frequency: Min 2X/week   Barriers to D/C:            Co-evaluation              End of Session    Activity Tolerance: Patient limited by fatigue;Treatment limited secondary to medical complications (Comment) (had to pause to suction mouth several times during assessment) Patient left: in bed;with call bell/phone within reach;with family/visitor present   Time: 1420-1440 OT Time Calculation (min): 20 min Charges:  OT General Charges $OT Visit: 1 Procedure OT Evaluation $Initial OT Evaluation Tier I: 1 Procedure G-Codes:    Vangie Bicker, OTR/L [phone]  09/28/2014, 2:58 PM

## 2014-09-28 NOTE — Care Management Utilization Note (Signed)
UR completed 

## 2014-09-28 NOTE — ED Notes (Signed)
Gave pt was given po tylenol 325 mg, informed Dr. Darrick Meigs, and he said that was fine.

## 2014-09-29 DIAGNOSIS — I429 Cardiomyopathy, unspecified: Secondary | ICD-10-CM

## 2014-09-29 DIAGNOSIS — I63011 Cerebral infarction due to thrombosis of right vertebral artery: Secondary | ICD-10-CM

## 2014-09-29 LAB — GLUCOSE, CAPILLARY
Glucose-Capillary: 164 mg/dL — ABNORMAL HIGH (ref 70–99)
Glucose-Capillary: 168 mg/dL — ABNORMAL HIGH (ref 70–99)
Glucose-Capillary: 142 mg/dL — ABNORMAL HIGH (ref 70–99)
Glucose-Capillary: 155 mg/dL — ABNORMAL HIGH (ref 70–99)

## 2014-09-29 NOTE — Progress Notes (Signed)
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 -Has significant issues with dysphagia, commonly seen in this type of CVA. -ST to reassess swallowing on Monday, and may need to consider PEG. -Awaiting admission to CIR. -ASA for secondary stroke prevention. -Has been started on a statin with an LDL of 136. -ECHO:Mild LVH with LVEF 60-65% and grade 1 diastolic dysfunction. Mildly thickened mitral leaflets with trivial mitral regurgitation. Unable to assess PASP. No obvious PFO or ASD. -Carotid Dopplers: Minimal to moderate amount of bilateral atherosclerotic plaque, left subjectively greater than right, not resulting in a hemodynamically significant stenosis  HTN -Fair control. -Allow permissive HTN given acute CVA.  DM -Fair control. -Continue current regimen.  Hypokalemia -Resolved.  Code Status: Full Code Family Communication: Patient only  Disposition Plan: CIR   Consultants:  Neurology   Antibiotics:  None   Subjective: Cognitively intact, still with issues swallowing; no complaints.  Objective: Filed Vitals:   09/28/14 1144 09/28/14 2203 09/29/14 0703 09/29/14 1425  BP: 143/68 155/71 154/86 120/71  Pulse: 64 66 74 80  Temp: 97.9 F (36.6 C) 98.7 F (37.1 C) 98.2 F (36.8 C) 98.4 F (36.9 C)  TempSrc: Oral Oral Oral Oral  Resp: 20 20 20    Height:      Weight:      SpO2: 96% 92% 96% 97%    Intake/Output Summary (Last 24 hours) at 09/29/14 1535 Last data filed at 09/29/14 1258  Gross per 24 hour  Intake      0 ml  Output    700 ml  Net   -700 ml   Filed Weights   09/27/14 2142 09/28/14 0055  Weight: 68.947 kg (152 lb) 70.67 kg (155 lb 12.8 oz)    Exam:   General:  AA ox3  Cardiovascular: RRR  Respiratory: CTA B  Abdomen: S/NT/ND/+BS  Extremities: trace bilateral edema    Data Reviewed: Basic Metabolic Panel:  Recent Labs Lab 09/27/14 2154 09/27/14 2204 09/28/14 0547  NA 132* 138 133*  K 3.1* 4.0 3.7  CL 97 98 98  CO2 26  --  26  GLUCOSE 195* 199* 269*  BUN 13 15 10   CREATININE 0.89 0.80 0.55  CALCIUM 9.1  --  8.9   Liver Function Tests:  Recent Labs Lab 09/27/14 2154  AST 66*  ALT 36*  ALKPHOS 139*  BILITOT 0.4  PROT 8.3  ALBUMIN 4.0   No results for input(s): LIPASE, AMYLASE in the last 168 hours. No results for input(s): AMMONIA in the last 168 hours. CBC:  Recent Labs Lab 09/27/14 2154 09/27/14 2204  WBC 11.6*  --   NEUTROABS 7.0  --   HGB 14.6 17.0*  HCT 44.3 50.0*  MCV 89.3  --   PLT 298  --    Cardiac Enzymes: No results for input(s): CKTOTAL, CKMB, CKMBINDEX, TROPONINI in the last 168 hours. BNP (last 3 results) No results for input(s): PROBNP in the last 8760 hours. CBG:  Recent Labs Lab 09/28/14 1141 09/28/14 1653 09/28/14 2202 09/29/14 0752 09/29/14 1135  GLUCAP 231* 182* 152* 164* 168*    No results found for this or any previous visit (from the past 240 hour(s)).   Studies: Ct Head Wo Contrast  09/27/2014   CLINICAL DATA:  Dizziness, headache, difficulty swallowing  EXAM: CT HEAD WITHOUT CONTRAST  TECHNIQUE: Contiguous axial images were obtained from the base of  the skull through the vertex without intravenous contrast.  COMPARISON:  None.  FINDINGS: Gray-white differentiation is maintained. No CT evidence acute large territory infarct. No intraparenchymal or extra-axial mass or hemorrhage. Normal size and configuration of the ventricles and basilar cisterns. No midline shift. Limited visualization of the paranasal sinuses and mastoid air cells is normal. Regional soft tissues appear normal. No displaced calvarial fracture.  IMPRESSION: Negative noncontrast head CT.   Electronically Signed   By: Sandi Mariscal M.D.   On: 09/27/2014 22:48   Mr [name] Head Wo Contrast  09/28/2014   CLINICAL DATA:  Gait  imbalance, leaning to the RIGHT side, with RIGHT-sided headache beginning 09/27/2014. Stroke risk factors include hypertension, diabetes, and vasculitis. Also history of breast cancer.  EXAM: MRI HEAD WITHOUT CONTRAST  MRA HEAD WITHOUT CONTRAST  TECHNIQUE: Multiplanar, multiecho pulse sequences of the brain and surrounding structures were obtained without intravenous contrast. Angiographic images of the head were obtained using MRA technique without contrast.  COMPARISON:  None.  CT head 09/27/2014.  FINDINGS: MRI HEAD FINDINGS  The patient was unable to remain motionless for the exam. Small or subtle lesions could be overlooked.  There is a moderate-sized area of restricted diffusion affecting the dorsal lateral medulla on the RIGHT, sparing the RIGHT cerebellar tonsil with suspected punctate areas of restricted diffusion in the RIGHT lateral inferior cerebellar hemisphere, consistent with an acute RIGHT vertebral PICA distribution infarct.  No visible hemorrhage, mass lesion, hydrocephalus, or extra-axial fluid. Mild cerebral and cerebellar atrophy. Mild subcortical and periventricular T2 and FLAIR hyperintensities, likely chronic microvascular ischemic change. Flow voids are maintained in the who carotid, vertebral, and basilar arteries. Pituitary, pineal, and cerebellar tonsils unremarkable. No upper cervical lesions.  Visualized calvarium, skull base, and upper cervical osseous structures unremarkable. Scalp and extracranial soft tissues, orbits, sinuses, and mastoids show no acute process.  MRA HEAD FINDINGS  The internal carotid arteries are widely patent. The basilar artery is widely patent. The LEFT vertebral is dominant and widely patent.  The RIGHT vertebral is non dominant. There is some artifactual signal loss in the mid V4 segment with a suspected underlying 1 cm 75% stenosis. The origin of the RIGHT PICA is poorly visualized but is probably diseased, correlating with the observed pattern of cerebral  infarction.  No proximal stenosis of the anterior or middle cerebral arteries. No proximal PCA disease. Mild irregularity of the distal MCA and PCA branches suggests intracranial atherosclerotic disease.  IMPRESSION: Acute RIGHT lateral medullary infarct, also with suspected inferior RIGHT lateral cerebellar hemisphere involvement.  Some artifactual signal loss of the distal RIGHT vertebral, with suspected underlying 1 cm long 75% stenosis. The origin of the RIGHT PICA is poorly visualized but is probably diseased as well.  Mild atrophy. Mild small vessel disease. No intracranial mass lesion is evident.   Electronically Signed   By: Rolla Flatten M.D.   On: 09/28/2014 09:10   Mri Brain Without Contrast  09/28/2014   CLINICAL DATA:  Gait imbalance, leaning to the RIGHT side, with RIGHT-sided headache beginning 09/27/2014. Stroke risk factors include hypertension, diabetes, and vasculitis. Also history of breast cancer.  EXAM: MRI HEAD WITHOUT CONTRAST  MRA HEAD WITHOUT CONTRAST  TECHNIQUE: Multiplanar, multiecho pulse sequences of the brain and surrounding structures were obtained without intravenous contrast. Angiographic images of the head were obtained using MRA technique without contrast.  COMPARISON:  None.  CT head 09/27/2014.  FINDINGS: MRI HEAD FINDINGS  The patient was unable to remain motionless for the exam.  Small or subtle lesions could be overlooked.  There is a moderate-sized area of restricted diffusion affecting the dorsal lateral medulla on the RIGHT, sparing the RIGHT cerebellar tonsil with suspected punctate areas of restricted diffusion in the RIGHT lateral inferior cerebellar hemisphere, consistent with an acute RIGHT vertebral PICA distribution infarct.  No visible hemorrhage, mass lesion, hydrocephalus, or extra-axial fluid. Mild cerebral and cerebellar atrophy. Mild subcortical and periventricular T2 and FLAIR hyperintensities, likely chronic microvascular ischemic change. Flow voids are  maintained in the who carotid, vertebral, and basilar arteries. Pituitary, pineal, and cerebellar tonsils unremarkable. No upper cervical lesions.  Visualized calvarium, skull base, and upper cervical osseous structures unremarkable. Scalp and extracranial soft tissues, orbits, sinuses, and mastoids show no acute process.  MRA HEAD FINDINGS  The internal carotid arteries are widely patent. The basilar artery is widely patent. The LEFT vertebral is dominant and widely patent.  The RIGHT vertebral is non dominant. There is some artifactual signal loss in the mid V4 segment with a suspected underlying 1 cm 75% stenosis. The origin of the RIGHT PICA is poorly visualized but is probably diseased, correlating with the observed pattern of cerebral infarction.  No proximal stenosis of the anterior or middle cerebral arteries. No proximal PCA disease. Mild irregularity of the distal MCA and PCA branches suggests intracranial atherosclerotic disease.  IMPRESSION: Acute RIGHT lateral medullary infarct, also with suspected inferior RIGHT lateral cerebellar hemisphere involvement.  Some artifactual signal loss of the distal RIGHT vertebral, with suspected underlying 1 cm long 75% stenosis. The origin of the RIGHT PICA is poorly visualized but is probably diseased as well.  Mild atrophy. Mild small vessel disease. No intracranial mass lesion is evident.   Electronically Signed   By: Rolla Flatten M.D.   On: 09/28/2014 09:10   US Carotid Bilateral  09/28/2014   CLINICAL DATA:  Hypertension, stroke, syncopal episode, hyperlipidemia, diabetes and smoking.  EXAM: BILATERAL CAROTID DUPLEX ULTRASOUND  TECHNIQUE: Pearline Cables scale imaging, color Doppler and duplex ultrasound were performed of bilateral carotid and vertebral arteries in the neck.  COMPARISON:  None.  FINDINGS: Criteria: Quantification of carotid stenosis is based on velocity parameters that correlate the residual internal carotid diameter with NASCET-based stenosis levels,  using the diameter of the distal internal carotid lumen as the denominator for stenosis measurement.  The following velocity measurements were obtained:  RIGHT  ICA:  52/17 cm/sec  CCA:  05/69 cm/sec  SYSTOLIC ICA/CCA RATIO:  0.7  DIASTOLIC ICA/CCA RATIO:  1.5  ECA:  92 cm/sec  LEFT  ICA:  69/21 cm/sec  CCA:  79/48 cm/sec  SYSTOLIC ICA/CCA RATIO:  1.4  DIASTOLIC ICA/CCA RATIO:  2.0  ECA:  62 cm/sec  RIGHT CAROTID ARTERY: There is a minimal amount of eccentric hypoechoic plaque within the right carotid bulb (image 16 and 17). There is a minimal of intimal thickening involving the origin and proximal aspects of the right internal carotid artery (image 25), not resulting in elevated peak systolic velocities with the interrogated course of the right internal carotid artery to suggest a hemodynamically significant stenosis.  RIGHT VERTEBRAL ARTERY:  Antegrade flow  LEFT CAROTID ARTERY: There is a minimal amount of eccentric mixed echogenic plaque within the left carotid bulb (image 51). There is a moderate amount of eccentric mixed echogenic plaque involving the origin and proximal aspect of the left internal carotid artery (image 59), not resulting in elevated peak systolic velocities with the interrogated course of the left internal carotid artery to suggest a hemodynamically  significant stenosis.  LEFT VERTEBRAL ARTERY:  Antegrade flow  IMPRESSION: Minimal to moderate amount of bilateral atherosclerotic plaque, left subjectively greater than right, not resulting in a hemodynamically significant stenosis.   Electronically Signed   By: Sandi Mariscal M.D.   On: 09/28/2014 09:13    Scheduled Meds: . antiseptic oral rinse  7 mL Mouth Rinse BID  . aspirin EC  81 mg Oral Daily  . aspirin  300 mg Rectal Daily  . enoxaparin (LOVENOX) injection  40 mg Subcutaneous Q24H  . insulin aspart  0-15 Units Subcutaneous TID WC  . insulin aspart  0-5 Units Subcutaneous QHS   Continuous Infusions:   Principal Problem:   CVA  (cerebral infarction) Active Problems:   Invasive ductal carcinoma of left breast   Cardiomyopathy   COPD (chronic obstructive pulmonary disease)   TIA (transient ischemic attack)   HTN (hypertension)    Time spent: 35 minutes. Greater than 50% of this time was spent in direct contact with the patient coordinating care.    Lelon Frohlich  [facility] Pager [phone]  If 7PM-7AM, please contact night-coverage at [email or web address], password [facility] 09/29/2014, 3:35 PM  LOS: 2 days

## 2014-09-29 NOTE — Progress Notes (Signed)
Physical Therapy Treatment Patient Details Name: Madison Todd MRN: [id number] DOB: [birth year] Today's Date: 09/29/2014    History of Present Illness 72 old female who  has a past medical history of Leukocytoclastic vasculitis; Diabetes mellitus; Breast cancer; Left-sided Breast cancer (07/22/2011); Cardiomyopathy (07/22/2011); COPD (chronic obstructive pulmonary disease) (07/22/2011); Hypertension; Hyperlipidemia; Invasive ductal carcinoma of left breast (07/22/2011); and Breast cancer, right breast (12/16/2013). Patient was brought to the ED after patient started having right-sided headache which improved after taking Tylenol, but patient noticed that she was having gait imbalance on walking, and she was leaning towards right side. Though she denies slurred speech, no weakness of extremities, did not pass out no blurred vision. Patient's symptoms resolved when she reached ED. Denies headache at this time. No chest pain shortness of breath. CT of the head was done in the ED, which was negative for stroke.  MRI  Her MRI is positive for stroke in the medulla and cerebellum     PT Comments    Patient able to tolerate standing 5 minutes with min to moderate assistance from therapist for maintenance of standing. Patient performed 2 bouts of standing with performance of sagittal plane and frontal plane weight shifts with hip swaying and during second bout patient performed marching as pre-gait training in preparation for walking to chair to improve sitting tolerance. Durign sitting at edge of bed patient displays Rt side lead secondary to limited trunk control but was able to maintain upright sitting with 1 UE up right while conversing or without UE support when verbally cued to maintain sitting upright posture for 30 seconds. Current plan of discharge CIR sill appropriate.                Mobility  Bed Mobility Overal bed mobility: Needs Assistance Bed Mobility: Rolling;Sidelying to Sit Rolling:  Modified independent (Device/Increase time) Sidelying to sit: Min assist       General bed mobility comments: Patient able to perform rolling and scooting up/over in bed without difficulty; able to complete partial supine to sit but requires assist tomaintain sitting  Transfers Overall transfer level: Needs assistance Equipment used: Rolling walker (2 wheeled)   Sit to Stand: Mod assist         General transfer comment: able to complete sit to stand with Mod assist and maintain standing with min assistance and ront weheeled walker. no complaint of dizzinessdifficulty weight shifting to Lt withotu therapst assistance.   Ambulation/Gait Ambulation/Gait assistance: Min assist Ambulation Distance (Feet): 3 Feet Assistive device: Rolling walker (2 wheeled)     Gait velocity interpretation: <1.8 ft/sec, indicative of risk for recurrent falls General Gait Details: 4 steps taken to  sit in up in chair with min assist for maintenance of balance   Stairs            Wheelchair Mobility    Modified Rankin (Stroke Patients Only)       Balance                                    Cognition Arousal/Alertness: Awake/alert Behavior During Therapy: WFL for tasks assessed/performed Overall Cognitive Status: Within Functional Limits for tasks assessed                      Exercises Total Joint Exercises Marching in Standing: AROM;10 reps;Standing    General Comments        Pertinent Vitals/Pain  Home Living                      Prior Function            PT Goals (current goals can now be found in the care plan section) Progress towards PT goals: Progressing toward goals    Frequency  Min 5X/week    PT Plan Current plan remains appropriate    Co-evaluation             End of Session Equipment Utilized During Treatment: Gait belt Activity Tolerance: Patient limited by fatigue;Other (comment) Patient left: with call  bell/phone within reach;in chair     Time: 0855-0920 PT Time Calculation (min) (ACUTE ONLY): 25 min  Charges:  $Gait Training: 8-22 mins $Therapeutic Activity: 8-22 mins                    G Codes:      Madison Todd 2014-10-01, 9:35 AM

## 2014-09-30 DIAGNOSIS — I63012 Cerebral infarction due to thrombosis of left vertebral artery: Secondary | ICD-10-CM

## 2014-09-30 LAB — BASIC METABOLIC PANEL
Anion gap: 10 (ref 5–15)
BUN: 20 mg/dL (ref 6–23)
CO2: 25 mmol/L (ref 19–32)
CREATININE: 0.71 mg/dL (ref 0.50–1.10)
Calcium: 9.4 mg/dL (ref 8.4–10.5)
Chloride: 103 mEq/L (ref 96–112)
GFR calc Af Amer: >90 mL/min (ref >90)
GFR calc non Af Amer: 89 mL/min — ABNORMAL LOW (ref >90)
Glucose, Bld: 139 mg/dL — ABNORMAL HIGH (ref 70–99)
Potassium: 2.9 mmol/L — ABNORMAL LOW (ref 3.5–5.1)
Sodium: 138 mmol/L (ref 135–145)

## 2014-09-30 LAB — RAPID URINE DRUG SCREEN, HOSP PERFORMED
Amphetamines: NOT DETECTED
Barbiturates: NOT DETECTED
Benzodiazepines: NOT DETECTED
Cocaine: NOT DETECTED
Opiates: NOT DETECTED
Tetrahydrocannabinol: NOT DETECTED

## 2014-09-30 LAB — GLUCOSE, CAPILLARY
GLUCOSE-CAPILLARY: 117 mg/dL — AB (ref 70–99)
Glucose-Capillary: 142 mg/dL — ABNORMAL HIGH (ref 70–99)
Glucose-Capillary: 142 mg/dL — ABNORMAL HIGH (ref 70–99)
Glucose-Capillary: 118 mg/dL — ABNORMAL HIGH (ref 70–99)

## 2014-09-30 LAB — RPR: RPR: NONREACTIVE

## 2014-09-30 LAB — MAGNESIUM: MAGNESIUM: 2.3 mg/dL (ref 1.5–2.5)

## 2014-09-30 LAB — VITAMIN B12: Vitamin B-12: 678 pg/mL (ref 211–911)

## 2014-09-30 MED ORDER — ATORVASTATIN CALCIUM 20 MG PO TABS: 20.0000 mg | ORAL_TABLET | Freq: Every day | ORAL | Status: DC

## 2014-09-30 MED ORDER — POTASSIUM CHLORIDE 10 MEQ/100ML IV SOLN
10.0000 meq | INTRAVENOUS | Status: AC
  Administered 2014-09-30 (×6): 10 meq via INTRAVENOUS
  Filled 2014-09-30 (×6): qty 100

## 2014-09-30 MED ORDER — SODIUM CHLORIDE 0.9 % IV SOLN
INTRAVENOUS | Status: DC
  Administered 2014-09-30 – 2014-10-01 (×2): via INTRAVENOUS

## 2014-09-30 NOTE — Care Management Note (Unsigned)
    Page 1 of 1   09/28/2014     4:24:56 PM CARE MANAGEMENT NOTE 09/28/2014  Patient:  Madison Todd, Madison Todd   Account Number:  [id number]  Date Initiated:  09/28/2014  Documentation initiated by:  Vladimir Creeks  Subjective/Objective Assessment:   Admitted with A +CVA, with balance issues and difficulty swallowing. 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 She has sons who are available and will assist as needed when she comes home     Action/Plan:   PT and OT both are suggesting CIR as appropriate for the pt and she and family are agreeing with this. Referral to [facility] [place] Date:  10/01/2014   Anticipated DC Plan:  ACUTE TO ACUTE TRANS      DC Planning Services  CM consult      PAC Choice  IP REHAB   Choice offered to / List presented to:  C-1 Patient           Status of service:  In process, will continue to follow Medicare Important Message given?   (If response is "NO", the following Medicare IM given date fields will be blank) Date Medicare IM given:   Medicare IM given by:   Date Additional Medicare IM given:   Additional Medicare IM given by:    Discharge Disposition:    Per UR Regulation:  Reviewed for med. necessity/level of care/duration of stay  If discussed at [place] of Stay Meetings, dates discussed:    Comments:  09/28/14 [place] RN/CM

## 2014-09-30 NOTE — Progress Notes (Signed)
Speech Language Pathology Treatment: Dysphagia  Patient Details Name: Madison Todd MRN: [id number] DOB: 1951/07/31 Today's Date: 09/30/2014 Time: 0820-0910 SLP Time Calculation (min) (ACUTE ONLY): 50 min  Assessment / Plan / Recommendation Clinical Impression  Ms. Ent was seen at bedside with her son and a friend present. She reports that she thinks her swallowing is "a little bit better" than when seen by SLP on Friday. She endorses frequent use of oral suction however. Pt assessed with ice chips and orange sherbet. SLP palpates a very delayed weak pharyngeal response to ice cream. Pt cued to masticate, move tongue, and implement effortful swallow as able in attempt to recruit stronger pharyngeal response. Pt needed oral suction, but was able to swallow small amounts. Head turns were attempted without strong indication of improvement. Voice is mildly breathy (pt feels baseline), but cough is strong. SLP provided pt and son information in regards to dysphagia with this type of stroke (lateral medullary). I suspect pt may required PEG, however will complete MBSS tomorrow to help determine course of action. This was discussed with RN, Audrea Muscat, and Dr. Jerilee Hoh.   Continue NPO with high risk oral care protocol. OK for Ms. Madison Todd to have 1-2 ice chips presented by RN when alert and upright after oral care for comfort. Pt currently does not have an IV, consider given NPO status.   HPI HPI: 17 old female who  has a past medical history of Leukocytoclastic vasculitis; Diabetes mellitus; Breast cancer; Left-sided Breast cancer (07/22/2011); Cardiomyopathy (07/22/2011); COPD (chronic obstructive pulmonary disease) (07/22/2011); Hypertension; Hyperlipidemia; Invasive ductal carcinoma of left breast (07/22/2011); and Breast cancer, right breast (12/16/2013). Admitted to ED 1/14 after having R side headache and gait imbalance. MRI 1/15 revealed acute R lateral medullary infarct with suspected  inferior R lateral cerebellar involvement. Pt failed stroke swallow screen; bedside swallow eval ordered.   Pertinent Vitals Pain Assessment: No/denies pain  SLP Plan  MBS;Continue with current plan of care    Recommendations Diet recommendations: NPO Medication Administration: Via alternative means              General recommendations: Rehab consult Oral Care Recommendations: Oral care Q4 per protocol Follow up Recommendations: Inpatient Rehab Plan: MBS;Continue with current plan of care   Thank you,  Genene Churn, [place]      [place] 09/30/2014, 9:58 AM

## 2014-09-30 NOTE — Progress Notes (Addendum)
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 -Has significant issues with dysphagia, commonly seen in this type of CVA. -ST to perform MBS in am; suspect she will need a PEG as these type of CVAs tend to cause prolonged dysphagia. -Awaiting admission to CIR. -ASA for secondary stroke prevention. -Has been started on a statin with an LDL of 136. -ECHO:Mild LVH with LVEF 60-65% and grade 1 diastolic dysfunction. Mildly thickened mitral leaflets with trivial mitral regurgitation. Unable to assess PASP. No obvious PFO or ASD. -Carotid Dopplers: Minimal to moderate amount of bilateral atherosclerotic plaque, left subjectively greater than right, not resulting in a hemodynamically significant stenosis  HTN -Fair control. -Allow permissive HTN given acute CVA.  DM -Fair control. -Continue current regimen.  Hypokalemia -K 2.9 today. -Check Mg. -Replete IV since NPO.  Code Status: Full Code Family Communication: Patient only  Disposition Plan: CIR   Consultants:  Neurology   Antibiotics:  None   Subjective: Cognitively intact, still with issues swallowing; no complaints.  Objective: Filed Vitals:   09/29/14 1425 09/29/14 2235 09/30/14 0552 09/30/14 1300  BP: 120/71 167/86 136/80 139/83  Pulse: 80 71 73 75  Temp: 98.4 F (36.9 C) 98.5 F (36.9 C) 98.4 F (36.9 C) 98 F (36.7 C)  TempSrc: Oral Oral Oral Oral  Resp: 20 20 21 20   Height:      Weight:      SpO2: 97% 94% 98% 97%    Intake/Output Summary (Last 24 hours) at 09/30/14 1706 Last data filed at 09/30/14 0800  Gross per 24 hour  Intake      0 ml  Output      0 ml  Net      0 ml   Filed Weights   09/27/14 2142 09/28/14 0055  Weight: 68.947 kg (152 lb) 70.67 kg (155 lb 12.8 oz)    Exam:   General:  AA ox3  Cardiovascular:  RRR  Respiratory: CTA B  Abdomen: S/NT/ND/+BS  Extremities: trace bilateral edema   Data Reviewed: Basic Metabolic Panel:  Recent Labs Lab 09/27/14 2154 09/27/14 2204 09/28/14 0547 09/30/14 0543  NA 132* 138 133* 138  K 3.1* 4.0 3.7 2.9*  CL 97 98 98 103  CO2 26  --  26 25  GLUCOSE 195* 199* 269* 139*  BUN 13 15 10 20   CREATININE 0.89 0.80 0.55 0.71  CALCIUM 9.1  --  8.9 9.4   Liver Function Tests:  Recent Labs Lab 09/27/14 2154  AST 66*  ALT 36*  ALKPHOS 139*  BILITOT 0.4  PROT 8.3  ALBUMIN 4.0   No results for input(s): LIPASE, AMYLASE in the last 168 hours. No results for input(s): AMMONIA in the last 168 hours. CBC:  Recent Labs Lab 09/27/14 2154 09/27/14 2204  WBC 11.6*  --   NEUTROABS 7.0  --   HGB 14.6 17.0*  HCT 44.3 50.0*  MCV 89.3  --   PLT 298  --    Cardiac Enzymes: No results for input(s): CKTOTAL, CKMB, CKMBINDEX, TROPONINI in the last 168 hours. BNP (last 3 results) No results for input(s): PROBNP in the last 8760 hours. CBG:  Recent Labs Lab 09/29/14 1706 09/29/14 2234 09/30/14 0738 09/30/14 1140 09/30/14 1702  GLUCAP 142* 155* 142* 142* 118*    No results found for this or any previous visit (from the past 240 hour(s)).  Studies: No results found.  Scheduled Meds: . antiseptic oral rinse  7 mL Mouth Rinse BID  . aspirin EC  81 mg Oral Daily  . aspirin  300 mg Rectal Daily  . atorvastatin  20 mg Oral q1800  . enoxaparin (LOVENOX) injection  40 mg Subcutaneous Q24H  . insulin aspart  0-15 Units Subcutaneous TID WC  . insulin aspart  0-5 Units Subcutaneous QHS   Continuous Infusions: . sodium chloride      Principal Problem:   CVA (cerebral infarction) Active Problems:   Invasive ductal carcinoma of left breast   Cardiomyopathy   COPD (chronic obstructive pulmonary disease)   TIA (transient ischemic attack)   HTN (hypertension)    Time spent: 35 minutes. Greater than 50% of this time was spent in direct  contact with the patient coordinating care.    Lelon Frohlich  [facility] Pager [phone]  If 7PM-7AM, please contact night-coverage at [email or web address], password [facility] 09/30/2014, 5:06 PM  LOS: 3 days

## 2014-10-01 ENCOUNTER — Inpatient Hospital Stay (HOSPITAL_COMMUNITY)
Admission: RE | Admit: 2014-10-01 | Discharge: 2014-10-23 | DRG: 057 | Disposition: A | Discharge status: Home Health | Payer: Commercial Managed Care - HMO | Source: Intra-hospital | Attending: Physical Medicine & Rehabilitation | Admitting: Physical Medicine & Rehabilitation

## 2014-10-01 ENCOUNTER — Inpatient Hospital Stay (HOSPITAL_COMMUNITY): Payer: Commercial Managed Care - HMO

## 2014-10-01 DIAGNOSIS — I10 Essential (primary) hypertension: Secondary | ICD-10-CM | POA: Diagnosis present

## 2014-10-01 DIAGNOSIS — H5500 Unspecified nystagmus: Secondary | ICD-10-CM | POA: Diagnosis present

## 2014-10-01 DIAGNOSIS — E876 Hypokalemia: Secondary | ICD-10-CM | POA: Diagnosis present

## 2014-10-01 DIAGNOSIS — Z9011 Acquired absence of right breast and nipple: Secondary | ICD-10-CM | POA: Diagnosis present

## 2014-10-01 DIAGNOSIS — I6381 Other cerebral infarction due to occlusion or stenosis of small artery: Secondary | ICD-10-CM | POA: Diagnosis present

## 2014-10-01 DIAGNOSIS — F17210 Nicotine dependence, cigarettes, uncomplicated: Secondary | ICD-10-CM | POA: Diagnosis present

## 2014-10-01 DIAGNOSIS — E119 Type 2 diabetes mellitus without complications: Secondary | ICD-10-CM

## 2014-10-01 DIAGNOSIS — I69391 Dysphagia following cerebral infarction: Secondary | ICD-10-CM

## 2014-10-01 DIAGNOSIS — J449 Chronic obstructive pulmonary disease, unspecified: Secondary | ICD-10-CM | POA: Diagnosis present

## 2014-10-01 DIAGNOSIS — I429 Cardiomyopathy, unspecified: Secondary | ICD-10-CM | POA: Diagnosis present

## 2014-10-01 DIAGNOSIS — I69351 Hemiplegia and hemiparesis following cerebral infarction affecting right dominant side: Principal | ICD-10-CM

## 2014-10-01 DIAGNOSIS — I69322 Dysarthria following cerebral infarction: Secondary | ICD-10-CM | POA: Diagnosis not present

## 2014-10-01 DIAGNOSIS — R270 Ataxia, unspecified: Secondary | ICD-10-CM

## 2014-10-01 DIAGNOSIS — E785 Hyperlipidemia, unspecified: Secondary | ICD-10-CM | POA: Diagnosis present

## 2014-10-01 DIAGNOSIS — R252 Cramp and spasm: Secondary | ICD-10-CM | POA: Diagnosis not present

## 2014-10-01 DIAGNOSIS — I69393 Ataxia following cerebral infarction: Secondary | ICD-10-CM

## 2014-10-01 DIAGNOSIS — I639 Cerebral infarction, unspecified: Secondary | ICD-10-CM | POA: Diagnosis not present

## 2014-10-01 DIAGNOSIS — E1142 Type 2 diabetes mellitus with diabetic polyneuropathy: Secondary | ICD-10-CM | POA: Diagnosis present

## 2014-10-01 DIAGNOSIS — G463 Brain stem stroke syndrome: Secondary | ICD-10-CM | POA: Diagnosis present

## 2014-10-01 DIAGNOSIS — H532 Diplopia: Secondary | ICD-10-CM | POA: Diagnosis present

## 2014-10-01 DIAGNOSIS — R1313 Dysphagia, pharyngeal phase: Secondary | ICD-10-CM | POA: Diagnosis present

## 2014-10-01 DIAGNOSIS — Z853 Personal history of malignant neoplasm of breast: Secondary | ICD-10-CM | POA: Diagnosis not present

## 2014-10-01 DIAGNOSIS — J438 Other emphysema: Secondary | ICD-10-CM

## 2014-10-01 LAB — GLUCOSE, CAPILLARY
GLUCOSE-CAPILLARY: 112 mg/dL — AB (ref 70–99)
Glucose-Capillary: 118 mg/dL — ABNORMAL HIGH (ref 70–99)
Glucose-Capillary: 120 mg/dL — ABNORMAL HIGH (ref 70–99)
Glucose-Capillary: 152 mg/dL — ABNORMAL HIGH (ref 70–99)

## 2014-10-01 LAB — BASIC METABOLIC PANEL
ANION GAP: 8 (ref 5–15)
BUN: 21 mg/dL (ref 6–23)
CALCIUM: 8.8 mg/dL (ref 8.4–10.5)
CHLORIDE: 108 meq/L (ref 96–112)
CO2: 22 mmol/L (ref 19–32)
CREATININE: 0.65 mg/dL (ref 0.50–1.10)
GFR calc Af Amer: >90 mL/min (ref >90)
Glucose, Bld: 102 mg/dL — ABNORMAL HIGH (ref 70–99)
POTASSIUM: 3.5 mmol/L (ref 3.5–5.1)
Sodium: 138 mmol/L (ref 135–145)

## 2014-10-01 LAB — CBC
HCT: 43.7 % (ref 36.0–46.0)
Hemoglobin: 14.8 g/dL (ref 12.0–15.0)
MCH: 29.6 pg (ref 26.0–34.0)
MCHC: 33.9 g/dL (ref 30.0–36.0)
MCV: 87.4 fL (ref 78.0–100.0)
PLATELETS: 289 10*3/uL (ref 150–400)
RBC: 5 MIL/uL (ref 3.87–5.11)
RDW: 14.3 % (ref 11.5–15.5)
WBC: 12.4 10*3/uL — AB (ref 4.0–10.5)

## 2014-10-01 MED ORDER — ACETAMINOPHEN 325 MG PO TABS
325.0000 mg | ORAL_TABLET | ORAL | Status: DC | PRN
  Administered 2014-10-07: 325 mg via ORAL
  Administered 2014-10-09 – 2014-10-18 (×9): 650 mg via ORAL
  Administered 2014-10-19 – 2014-10-21 (×2): 325 mg via ORAL
  Administered 2014-10-22: 650 mg via ORAL
  Filled 2014-10-01 (×10): qty 2
  Filled 2014-10-01: qty 1
  Filled 2014-10-01 (×2): qty 2

## 2014-10-01 MED ORDER — ONDANSETRON HCL 4 MG/2ML IJ SOLN: 4.0000 mg | Freq: Four times a day (QID) | INTRAMUSCULAR | Status: DC | PRN

## 2014-10-01 MED ORDER — CETYLPYRIDINIUM CHLORIDE 0.05 % MT LIQD
7.0000 mL | Freq: Two times a day (BID) | OROMUCOSAL | Status: DC
  Administered 2014-10-01 – 2014-10-23 (×39): 7 mL via OROMUCOSAL

## 2014-10-01 MED ORDER — STARCH (THICKENING) PO POWD
ORAL | Status: DC | PRN
  Filled 2014-10-01: qty 227

## 2014-10-01 MED ORDER — SENNOSIDES-DOCUSATE SODIUM 8.6-50 MG PO TABS
2.0000 | ORAL_TABLET | Freq: Every day | ORAL | Status: DC
  Administered 2014-10-01 – 2014-10-18 (×18): 2 via ORAL
  Filled 2014-10-01 (×21): qty 2

## 2014-10-01 MED ORDER — CHLORHEXIDINE GLUCONATE 0.12 % MT SOLN
15.0000 mL | Freq: Two times a day (BID) | OROMUCOSAL | Status: DC
  Administered 2014-10-01: 15 mL via OROMUCOSAL
  Filled 2014-10-01: qty 15

## 2014-10-01 MED ORDER — INSULIN ASPART 100 UNIT/ML ~~LOC~~ SOLN
0.0000 [IU] | Freq: Three times a day (TID) | SUBCUTANEOUS | Status: DC
  Administered 2014-10-02: 2 [IU] via SUBCUTANEOUS
  Administered 2014-10-02 – 2014-10-04 (×4): 3 [IU] via SUBCUTANEOUS
  Administered 2014-10-04 (×2): 2 [IU] via SUBCUTANEOUS
  Administered 2014-10-05 (×2): 3 [IU] via SUBCUTANEOUS
  Administered 2014-10-05 – 2014-10-06 (×2): 2 [IU] via SUBCUTANEOUS
  Administered 2014-10-06: 5 [IU] via SUBCUTANEOUS
  Administered 2014-10-06: 3 [IU] via SUBCUTANEOUS
  Administered 2014-10-07 (×2): 2 [IU] via SUBCUTANEOUS
  Administered 2014-10-07: 3 [IU] via SUBCUTANEOUS
  Administered 2014-10-08 (×2): 2 [IU] via SUBCUTANEOUS
  Administered 2014-10-08 – 2014-10-09 (×2): 3 [IU] via SUBCUTANEOUS
  Administered 2014-10-09: 2 [IU] via SUBCUTANEOUS
  Administered 2014-10-09: 3 [IU] via SUBCUTANEOUS
  Administered 2014-10-10: 2 [IU] via SUBCUTANEOUS
  Administered 2014-10-10: 3 [IU] via SUBCUTANEOUS
  Administered 2014-10-10 – 2014-10-11 (×3): 2 [IU] via SUBCUTANEOUS
  Administered 2014-10-11 – 2014-10-12 (×2): 3 [IU] via SUBCUTANEOUS
  Administered 2014-10-12 (×2): 2 [IU] via SUBCUTANEOUS
  Administered 2014-10-13: 3 [IU] via SUBCUTANEOUS
  Administered 2014-10-13: 2 [IU] via SUBCUTANEOUS
  Administered 2014-10-13: 3 [IU] via SUBCUTANEOUS
  Administered 2014-10-14: 2 [IU] via SUBCUTANEOUS
  Administered 2014-10-14: 3 [IU] via SUBCUTANEOUS
  Administered 2014-10-14 – 2014-10-15 (×3): 2 [IU] via SUBCUTANEOUS
  Administered 2014-10-15: 3 [IU] via SUBCUTANEOUS
  Administered 2014-10-16 (×2): 2 [IU] via SUBCUTANEOUS
  Administered 2014-10-16: 5 [IU] via SUBCUTANEOUS
  Administered 2014-10-17: 3 [IU] via SUBCUTANEOUS
  Administered 2014-10-17 (×2): 2 [IU] via SUBCUTANEOUS
  Administered 2014-10-18: 3 [IU] via SUBCUTANEOUS
  Administered 2014-10-18 – 2014-10-19 (×3): 2 [IU] via SUBCUTANEOUS
  Administered 2014-10-19: 3 [IU] via SUBCUTANEOUS
  Administered 2014-10-20 (×2): 2 [IU] via SUBCUTANEOUS
  Administered 2014-10-20: 3 [IU] via SUBCUTANEOUS
  Administered 2014-10-21: 2 [IU] via SUBCUTANEOUS
  Administered 2014-10-21: 3 [IU] via SUBCUTANEOUS
  Administered 2014-10-21 – 2014-10-23 (×4): 2 [IU] via SUBCUTANEOUS

## 2014-10-01 MED ORDER — SORBITOL 70 % SOLN: 30.0000 mL | Freq: Every day | Status: DC | PRN

## 2014-10-01 MED ORDER — ASPIRIN EC 81 MG PO TBEC
81.0000 mg | DELAYED_RELEASE_TABLET | Freq: Every day | ORAL | Status: DC
  Administered 2014-10-02 – 2014-10-12 (×11): 81 mg via ORAL
  Filled 2014-10-01 (×13): qty 1

## 2014-10-01 MED ORDER — ATORVASTATIN CALCIUM 20 MG PO TABS
20.0000 mg | ORAL_TABLET | Freq: Every day | ORAL | Status: DC
  Administered 2014-10-06 – 2014-10-08 (×2): 20 mg via ORAL
  Filled 2014-10-01 (×19): qty 1

## 2014-10-01 MED ORDER — ENOXAPARIN SODIUM 40 MG/0.4ML ~~LOC~~ SOLN
40.0000 mg | SUBCUTANEOUS | Status: DC
  Administered 2014-10-02 – 2014-10-22 (×21): 40 mg via SUBCUTANEOUS
  Filled 2014-10-01 (×24): qty 0.4

## 2014-10-01 MED ORDER — ONDANSETRON HCL 4 MG PO TABS: 4.0000 mg | ORAL_TABLET | Freq: Four times a day (QID) | ORAL | Status: DC | PRN

## 2014-10-01 MED ORDER — ENOXAPARIN SODIUM 40 MG/0.4ML ~~LOC~~ SOLN: 40.0000 mg | SUBCUTANEOUS | Status: DC

## 2014-10-01 MED ORDER — CETYLPYRIDINIUM CHLORIDE 0.05 % MT LIQD
7.0000 mL | Freq: Two times a day (BID) | OROMUCOSAL | Status: DC
  Administered 2014-10-01: 7 mL via OROMUCOSAL

## 2014-10-01 MED ORDER — SODIUM CHLORIDE 0.9 % IV SOLN
INTRAVENOUS | Status: DC
  Administered 2014-10-01: 75 mL/h via INTRAVENOUS

## 2014-10-01 NOTE — Progress Notes (Signed)
Patient ID: Madison Todd, female   DOB: March 14, 1951, 64 y.o.   MRN: [id number] Patient admitted to [facility] via stretcher, escorted by EMS staff.  Patient verbalized understanding of rehab process, signed fall safety agreement.  Appears to be in no immediate distress at this time.  Will continue to monitor.  Brita Romp, RN

## 2014-10-01 NOTE — H&P (Signed)
Physical Medicine and Rehabilitation Admission H&P   Chief Complaint  Patient presents with  . Dizziness  . Headache  : HPI: 64 year old right handed female with history of leukocytoclastic vasculitis, diabetes mellitus and peripheral neuropathy, cardiomyopathy, COPD/tobacco abuse, hypertension and bilateral breast cancer with lumpectomy as well as chemoradiation. Presented 09/27/2014 with severe headache, blurred vision, right-sided weakness and difficulty in swallowing. Initial cranial CT scan negative. MRI of the brain with acute right lateral medullary infarct as well as suspected inferior right lateral cerebellar hemisphere involvement. MRA of the head with some artifactual signal loss in the mid V4 segment with a suspected underlying 1 cm 75% stenosis on the right. Echocardiogram with ejection fraction of 34% grade 1 diastolic dysfunction. Carotid Dopplers with no ICA stenosis. Patient did not receive TPA. Neurology consulted(Dr.[place]) maintain on aspirin for CVA prophylaxis as well as subcutaneous Lovenox for DVT prophylaxis. Noted dysphagia and has been nothing by mouth with modified barium swallow 10/01/2014 and recommendations of pured with honey thick liquids turning head to the right and supervision with meals. IV fluids for hydration. Bouts of hypokalemia resolved with potassium supplement. Hemoglobin A1c of 8.9 with insulin therapy as directed. Physical and occupational therapy evaluations completed.Patient was admitted for a comprehensive rehab program.     Review of Systems  Eyes: Positive for blurred vision.  Neurological: Positive for dizziness and headaches.  All other systems reviewed and are negative.  Past Medical History  Diagnosis Date  . Leukocytoclastic vasculitis   . Diabetes mellitus   . Breast cancer     left 2007/right 1997  . Left-sided Breast cancer 07/22/2011  . Cardiomyopathy 07/22/2011  . COPD (chronic obstructive  pulmonary disease) 07/22/2011  . Hypertension   . Hyperlipidemia   . 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 Stage II (T2 N0). ER 73%, PR 90%, Her2 positive, Ki-67 19%. 2.1 cm in size. Agreed to only have radiation, Herceptin, and Aromasin. Right sided breast cancer: Stage II (T2 N0 M0). 2.5 cm in size. Surgery on 04/28/1996 with axillary dissection on 05/10/1996. 11 negative nodes. Treated with AC x 4 cycles for ER negative, PR positive at only 20%.   . Breast cancer, right breast 12/16/2013   Past Surgical History  Procedure Laterality Date  . Mastectomy, radical      right  . Breast lumpectomy      left  . Port-a-cath removal    . Colonoscopy  07/20/2005    RMR: Diminutive ascending colon polyp, status post resection . Inflamed focally adenomatous polyp  . Colonoscopy N/A 10/05/2013    RMR: Multiple rectal and colonic polyps-removed/treated. 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 other polyps tubular adenomas  . Flexible sigmoidoscopy N/A 10/11/2013    RMR: rectal polypectomy site identified, assitional resection and tatooing performed. 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 Flexible sigmoidoscopy N/A 11/24/2013    Procedure: FLEXIBLE SIGMOIDOSCOPY; Surgeon: Daneil Dolin, MD; Location: [facility]; Service: Endoscopy; Laterality: N/A; 1:00-moved to [place] notified pt   Family History  Problem Relation Age of Onset  . Diabetes Father   . Cancer Maternal Aunt   . Cancer Paternal Aunt   . Colon cancer Neg Hx    Social History:  reports that she has been smoking Cigarettes. She has a 9 pack-year smoking history. She has never used smokeless tobacco. She reports that she does not drink alcohol or use illicit  drugs. Allergies:  Allergies  Allergen Reactions  . Lisinopril Other (See  Comments)    Tongue swelling  . Aromasin [Exemestane] Rash   Medications Prior to Admission  Medication Sig Dispense Refill  . acetaminophen (TYLENOL) 325 MG tablet Take 325 mg by mouth at bedtime as needed (help relax, sleep).     Marland Kitchen aspirin EC 81 MG tablet Take 81 mg by mouth daily.    . cholecalciferol (VITAMIN D) 1000 UNITS tablet Take 1,000 Units by mouth daily.    Marland Kitchen ibuprofen (ADVIL,MOTRIN) 200 MG tablet Take 200 mg by mouth every 6 (six) hours as needed for mild pain or moderate pain.    . metFORMIN (GLUCOPHAGE-XR) 500 MG 24 hr tablet Take 500 mg by mouth daily with breakfast.     . metoprolol (TOPROL-XL) 50 MG 24 hr tablet Take 50 mg by mouth daily.     . Olmesartan-Amlodipine-HCTZ (TRIBENZOR) 40-5-12.5 MG TABS Take 1 tablet by mouth daily.       Home: Home Living Family/patient expects to be discharged to:: Private residence Living Arrangements: Other relatives Available Help at Discharge: Family Type of Home: House Home Access: Stairs to enter [employer] of Steps: 1 in back, 3 in front Entrance Stairs-Rails: (unknown) Home Layout: One level Home Equipment: None Additional Comments: Patient does not appear to have any DME for use at home  Functional History: Prior Function Level of Independence: Independent  Functional Status:  Mobility: Bed Mobility Overal bed mobility: Needs Assistance Bed Mobility: Rolling, Sidelying to Sit Rolling: Modified independent (Device/Increase time) Sidelying to sit: Min assist General bed mobility comments: Patient able to perform rolling and scooting up/over in bed without difficulty; able to complete partial supine to sit but requires assist tomaintain sitting Transfers Overall transfer level: Needs assistance Equipment used: Rolling walker (2 wheeled) Sit to Stand: Mod assist General transfer comment: able to complete sit to stand with Mod assist and maintain  standing with min assistance and ront weheeled walker. no complaint of dizzinessdifficulty weight shifting to Lt withotu therapst assistance.  Ambulation/Gait Ambulation/Gait assistance: Min assist Ambulation Distance (Feet): 3 Feet Assistive device: Rolling walker (2 wheeled) Gait velocity interpretation: <1.8 ft/sec, indicative of risk for recurrent falls General Gait Details: 4 steps taken to sit in up in chair with min assist for maintenance of balance    ADL: ADL General ADL Comments: able to simulate washing her face and hands independently after setup. Patient demonstrating fair - to poor dynamic sitting balance, will need extensive assistance with lower extremity ADLs.  Cognition: Cognition Overall Cognitive Status: Within Functional Limits for tasks assessed Orientation Level: Oriented X4 Cognition Arousal/Alertness: Awake/alert Behavior During Therapy: WFL for tasks assessed/performed Overall Cognitive Status: Within Functional Limits for tasks assessed  Physical Exam: Blood pressure 147/78, pulse 74, temperature 97.8 F (36.6 C), temperature source Oral, resp. rate 20, height 5' 4" (1.626 m), weight 70.67 kg (155 lb 12.8 oz), SpO2 100 %. Physical Exam  Constitutional: She appears well-developed.   HENT: dentition fair. Mucosa pink an moist Head: Normocephalic.  Eyes: EOM are normal.  Neck: Normal range of motion. Neck supple. No thyromegaly present.  Cardiovascular: Normal rate and regular rhythm.  Respiratory: Effort normal and breath sounds normal. No respiratory distress.  GI: Soft. Bowel sounds are normal. She exhibits no distension.  Neurological: She is alert.  Oriented to person place and date of birth.Follows commands.Makes good eye contact with examiner.  Right upper and lower ext limb ataxia. MMT: 4 to 4+ right deltoid, bicep  tricep, hand, RLE: 4+ to 5/5. Sensation sl diminished to LT 1+/2 Right upper and lower. Diplopia with lateral gaze to either size.  4-5 beats of nystagmus with lateral gaze to either side as well.  Speech intelligible. Slight dysarthria. Has good insight and awareness.  Skin: Skin is warm and dry.     Lab Results Last 48 Hours    Results for orders placed or performed during the hospital encounter of 09/27/14 (from the past 48 hour(s))  Glucose, capillary Status: Abnormal   Collection Time: 09/29/14 11:35 AM  Result Value Ref Range   Glucose-Capillary 168 (H) 70 - 99 mg/dL   Comment 1 Documented in Chart    Comment 2 Notify RN   Glucose, capillary Status: Abnormal   Collection Time: 09/29/14 5:06 PM  Result Value Ref Range   Glucose-Capillary 142 (H) 70 - 99 mg/dL   Comment 1 Documented in Chart    Comment 2 Notify RN   Glucose, capillary Status: Abnormal   Collection Time: 09/29/14 10:34 PM  Result Value Ref Range   Glucose-Capillary 155 (H) 70 - 99 mg/dL   Comment 1 Documented in Chart    Comment 2 Notify RN   Urine rapid drug screen (hosp performed) Status: None   Collection Time: 09/30/14 2:27 AM  Result Value Ref Range   Opiates NONE DETECTED NONE DETECTED   Cocaine NONE DETECTED NONE DETECTED   Benzodiazepines NONE DETECTED NONE DETECTED   Amphetamines NONE DETECTED NONE DETECTED   Tetrahydrocannabinol NONE DETECTED NONE DETECTED   Barbiturates NONE DETECTED NONE DETECTED    Comment:   DRUG SCREEN FOR MEDICAL PURPOSES ONLY. IF CONFIRMATION IS NEEDED FOR ANY PURPOSE, NOTIFY LAB WITHIN 5 DAYS.   LOWEST DETECTABLE LIMITS FOR URINE DRUG SCREEN Drug Class Cutoff (ng/mL) Amphetamine 1000 Barbiturate 200 Benzodiazepine 202 Tricyclics 542 Opiates 706 Cocaine 300 THC 50   Basic metabolic panel Status: Abnormal   Collection Time: 09/30/14 5:43 AM  Result Value Ref Range   Sodium 138 135 - 145 mmol/L     Comment: Please note change in reference range.   Potassium 2.9 (L) 3.5 - 5.1 mmol/L    Comment: DELTA CHECK NOTED RESULT REPEATED AND VERIFIED Please note change in reference range.    Chloride 103 96 - 112 mEq/L   CO2 25 19 - 32 mmol/L   Glucose, Bld 139 (H) 70 - 99 mg/dL   BUN 20 6 - 23 mg/dL   Creatinine, Ser 0.71 0.50 - 1.10 mg/dL   Calcium 9.4 8.4 - 10.5 mg/dL   GFR calc non Af Amer 89 (L) >90 mL/min   GFR calc Af Amer >90 >90 mL/min    Comment: (NOTE) The eGFR has been calculated using the CKD EPI equation. This calculation has not been validated in all clinical situations. eGFR's persistently <90 mL/min signify possible Chronic Kidney Disease.    Anion gap 10 5 - 15  Glucose, capillary Status: Abnormal   Collection Time: 09/30/14 7:38 AM  Result Value Ref Range   Glucose-Capillary 142 (H) 70 - 99 mg/dL   Comment 1 Documented in Chart    Comment 2 Notify RN   Glucose, capillary Status: Abnormal   Collection Time: 09/30/14 11:40 AM  Result Value Ref Range   Glucose-Capillary 142 (H) 70 - 99 mg/dL   Comment 1 Documented in Chart    Comment 2 Notify RN   Glucose, capillary Status: Abnormal   Collection Time: 09/30/14 5:02 PM  Result Value Ref Range   Glucose-Capillary  118 (H) 70 - 99 mg/dL   Comment 1 Notify RN    Comment 2 Documented in Chart   Magnesium Status: None   Collection Time: 09/30/14 5:30 PM  Result Value Ref Range   Magnesium 2.3 1.5 - 2.5 mg/dL  Glucose, capillary Status: Abnormal   Collection Time: 09/30/14 9:17 PM  Result Value Ref Range   Glucose-Capillary 117 (H) 70 - 99 mg/dL   Comment 1 Notify RN    Comment 2 Documented in Chart   Basic metabolic panel Status: Abnormal   Collection Time: 10/01/14 5:35 AM  Result Value Ref Range   Sodium 138 135 - 145 mmol/L    Comment:  Please note change in reference range.   Potassium 3.5 3.5 - 5.1 mmol/L    Comment: DELTA CHECK NOTED Please note change in reference range.    Chloride 108 96 - 112 mEq/L   CO2 22 19 - 32 mmol/L   Glucose, Bld 102 (H) 70 - 99 mg/dL   BUN 21 6 - 23 mg/dL   Creatinine, Ser 0.65 0.50 - 1.10 mg/dL   Calcium 8.8 8.4 - 10.5 mg/dL   GFR calc non Af Amer >90 >90 mL/min   GFR calc Af Amer >90 >90 mL/min    Comment: (NOTE) The eGFR has been calculated using the CKD EPI equation. This calculation has not been validated in all clinical situations. eGFR's persistently <90 mL/min signify possible Chronic Kidney Disease.    Anion gap 8 5 - 15  Glucose, capillary Status: Abnormal   Collection Time: 10/01/14 7:35 AM  Result Value Ref Range   Glucose-Capillary 118 (H) 70 - 99 mg/dL   Comment 1 Notify RN       Imaging Results (Last 48 hours)    No results found.       Medical Problem List and Plan: 1. 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 DVT Prophylaxis/Anticoagulation: SQ Lovenox .Monitor platelet and any signs of bleeding 3. Pain Management: Tylenol 4. Dysphagia. Pured honey thick liquids. Follow-up speech therapy. IV fluids for hydration 5. Neuropsych: This patient is capable of making decisions on her own behalf. 6. Skin/Wound Care: Routine skin checks 7. Fluids/Electrolytes/Nutrition: Strict I & O.Follow up labs 8.Hypertension. No present antihypertensive medication. Patient on Toprol 50 mg daily prior to admission as well as Tribenzor 40-5-12.5 mg daily.resume as tolerated.Monitor with increased mobility 9.diabetes mellitus with peripheral neuropathy. Patient with hemoglobin A1c 8.9. Sliding scale insulin. Check blood sugars before meals and at bedtime. Patient on Glucophage 500 mg daily prior to admission and resume as  tolerated 10.Hyperlipidemia.lipitor 11.COPD/tobacco abuse.Counseling 12.HX Bilat. breast cancer.Follow up oncology out patient      Post Admission Physician Evaluation: 1. Functional deficits secondary to right lateral medullary infarct. 2. Patient is admitted to receive collaborative, interdisciplinary care between the physiatrist, rehab nursing staff, and therapy team. 3. Patient's level of medical complexity and substantial therapy needs in context of that medical necessity cannot be provided at a lesser intensity of care such as a SNF. 4. Patient has experienced substantial functional loss from his/her baseline which was documented above under the "Functional History" and "Functional Status" headings. Judging by the patient's diagnosis, physical exam, and functional history, the patient has potential for functional progress which will result in measurable gains while on inpatient rehab. These gains will be of substantial and practical use upon discharge in facilitating mobility and self-care at the household level. 5. Physiatrist will provide 24 hour management  of medical needs as well as oversight of the therapy plan/treatment and provide guidance as appropriate regarding the interaction of the two. 6. 24 hour rehab nursing will assist with bladder management, bowel management, safety, skin/wound care, disease management, medication administration, pain management and patient education and help integrate therapy concepts, techniques,education, etc. 7. PT will assess and treat for/with: Lower extremity strength, range of motion, stamina, balance, functional mobility, safety, adaptive techniques and equipment, NMR, coordination, vestibular assessment. Goals are: mod I to supervision. 8. OT will assess and treat for/with: ADL's, functional mobility, safety, upper extremity strength, adaptive techniques and equipment, NMR, coordination, vestibular assessment/ ego support, community  reintegration . Goals are: mod I to supervision. Therapy may proceed with showering this patient. 9. SLP will assess and treat for/with: speech, swallowing, communication . Goals are: mod I. 10. Case Management and Social Worker will assess and treat for psychological issues and discharge planning. 11. Team conference will be held weekly to assess progress toward goals and to determine barriers to discharge. 12. Patient will receive at least 3 hours of therapy per day at least 5 days per week. 13. ELOS: 9-12 days  14. Prognosis: excellent     [name], MD, [facility] 10/01/2014  10/01/2014

## 2014-10-01 NOTE — Progress Notes (Signed)
Rehab admissions - I am following pt's case and noted that pt is to have MBS swallow study this am. Per MD, "suspect she will need a PEG as these type of CVAs tend to cause prolonged dysphagia". In discussion with rehab MD, if team feels that the need for PEG is imminent, then PEG will need to be placed at [facility]. However, if there is a possibility that pt will not need PEG, then pt could be admitted before one is placed.  In addition, I am also waiting on insurance response from [facility]. I left message with Cephus Richer this am to seek an update.  I called and left voicemail with Tandy Gaw, case manager with these updates. I will keep the pt/family and medical team aware of any updates and will check on SLP's recommendations following today's swallow study.  Please call me with any questions. Thanks.  Nanetta Batty, PT Rehabilitation Admissions Coordinator [phone]

## 2014-10-01 NOTE — Progress Notes (Signed)
Rehab admissions - I am following pt's case and we did receive insurance approval from [facility] for inpatient rehab. Pt completed her swallow study this am and per case manager, pt did pass the swallow test. We await her latest diet recommendations per SLP. We have a rehab bed available today and will admit today to inpatient rehab.  I updated pt by phone and she is pleased with the plan for rehab today. I also left a voicemail with pt's son Nate.   Janett Billow, case manager is aware of the plan and Janett Billow is to inform RN and to help plan for [facility] transport for today at 1400.  Please call me with any questions. Thanks.  Nanetta Batty, PT Rehabilitation Admissions Coordinator [phone]

## 2014-10-01 NOTE — Progress Notes (Signed)
Pt transferred in the care of [facility]. Report given to [facility] R.N. [facility].

## 2014-10-01 NOTE — Procedures (Signed)
Objective Swallowing Evaluation: Modified Barium Swallowing Study  Patient Details  Name: Madison Todd MRN: [id number] Date of Birth: 1950/10/16  Today's Date: 10/01/2014 Time: SLP Start Time (ACUTE ONLY): 0945-SLP Stop Time (ACUTE ONLY): 1022 SLP Time Calculation (min) (ACUTE ONLY): 37 min  Past Medical History:  Past Medical History  Diagnosis Date  . Leukocytoclastic vasculitis   . Diabetes mellitus   . Breast cancer     left 2007/right 1997  . Left-sided Breast cancer 07/22/2011  . Cardiomyopathy 07/22/2011  . COPD (chronic obstructive pulmonary disease) 07/22/2011  . Hypertension   . Hyperlipidemia   . 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  Stage II (T2 N0).  ER 73%, PR 90%, Her2 positive, Ki-67 19%.  2.1 cm in size.  Agreed to only have radiation, Herceptin, and Aromasin.  Right sided breast cancer: Stage II (T2 N0 M0).  2.5 cm in size.  Surgery on 04/28/1996 with axillary dissection on 05/10/1996.  11 negative nodes.  Treated with AC x 4 cycles for ER negative, PR positive at only 20%.    . Breast cancer, right breast 12/16/2013   Past Surgical History:  Past Surgical History  Procedure Laterality Date  . Mastectomy, radical      right  . Breast lumpectomy      left  . Port-a-cath removal    . Colonoscopy  07/20/2005    RMR: Diminutive ascending colon polyp, status post resection . Inflamed focally adenomatous polyp  . Colonoscopy N/A 10/05/2013    RMR: Multiple rectal and colonic polyps-removed/treated. 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 other polyps tubular adenomas  . Flexible sigmoidoscopy N/A 10/11/2013    RMR: rectal polypectomy site identified, assitional resection and tatooing performed. 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 Flexible sigmoidoscopy N/A 11/24/2013    Procedure: FLEXIBLE SIGMOIDOSCOPY;  Surgeon: Daneil Dolin, MD;  Location: [facility];  Service:  Endoscopy;  Laterality: N/A;  1:00-moved to [place] notified pt   HPI:  HPI: 11 old female who  has a past medical history of Leukocytoclastic vasculitis; Diabetes mellitus; Breast cancer; Left-sided Breast cancer (07/22/2011); Cardiomyopathy (07/22/2011); COPD (chronic obstructive pulmonary disease) (07/22/2011); Hypertension; Hyperlipidemia; Invasive ductal carcinoma of left breast (07/22/2011); and Breast cancer, right breast (12/16/2013). Admitted to ED 1/14 after having R side headache and gait imbalance. 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 Pt failed stroke swallow screen; bedside swallow eval ordered.  No Data Recorded  Clinical Impression (EPIC issues): Pt presents with mild oral phase and mod/severe sensorimotor based pharyngeal phase dysphagia characterized by delay in swallow initiation, reduced tongue base retraction, decreased excursion of hyolaryngeal complex, with decreased epiglottic deflection resulting in delay in swallow response- pooling into pyriforms, reduced opening of UES, and significant residuals post swallow in pyriforms. Pt with seemingly fairly good sensation around laryngeal vestibule as evidenced by good airway protection/throat clearing. Pt with episode of trace aspiration of thins before/during the swallow, but was able to remove with cough. Pharyngeal residue was minimized with implementation of head turn to the right for the primary swallow (much better with puree/mech soft with this technique). Although pt does have residuals in pyriforms with liquids (which certainly pose a risk for aspiration) after the swallow, her cognition and awareness are good so pt is able to throat clear, cough, and repeat swallow to protect airway. Given that pt  has improved since Friday (absent swallow response) and cognition is good, will proceed with oral diet of D1/puree and Honey-thick liquids with head turn to the right  and repeat swallows to facilitate clearance. Pt will need oral suction at bedside and recommend continued IV fluids until pt able to maintain adequate hydration needs on dysphagia diet. Pt will need to be monitored for fatigue and intake. Placement of alternative means of nutrition will need to be reconsidered if pt unable to maintain adequate nutrition by mouth. Pt wishes to avoid feeding tubes if possible. Pt will benefit from skilled dysphagia intervention in acute rehab setting to maximize recovery. Prognosis for improvement is good given pt motivation, awareness, family support, and progress made thus far. Recommend repeat objective study in 5-7 days or as clinically appropriate.  Assessment / Plan / Recommendation CHL IP CLINICAL IMPRESSIONS 10/01/2014  Dysphagia Diagnosis Mild oral phase dysphagia;Moderate pharyngeal phase dysphagia;Severe pharyngeal phase dysphagia;Mild cervical esophageal phase dysphagia  Clinical impression Pt presents with mild oral phase and mod/severe sensorimotor based pharyngeal phase dysphagia characterized by delay in swallow initiation, reduced tongue base retraction, decreased excursion of hyolaryngeal complex, with decreased epiglottic deflection resulting in delay in swallow response- pooling into pyriforms, reduced opening of UES, and significant residuals post swallow in pyriforms. Pt with seemingly fairly good sensation around laryngeal vestibule as evidenced by good airway protection/throat clearing. Pt with episode of trace aspiration of thins before/during the swallow, but was able to remove with cough. Pharyngeal residue was minimized with implementation of head turn to the right for the primary swallow (much better with puree/mech soft with this technique). Although pt does have residuals in pyriforms with liquids (which certainly pose a risk for aspiration) after the swallow, her cognition and awareness are good so pt is able to throat clear, cough, and repeat  swallow to protect airway. Given that pt has improved since Friday (absent swallow response) and cognition is good, will proceed with oral diet of D1/puree and Honey-thick liquids with head turn to the right and repeat swallows to facilitate clearance. Pt will need oral suction at bedside and recommend continued IV fluids until pt able to maintain adequate hydration needs on dysphagia diet. Pt will need to be monitored for fatigue and intake. Placement of alternative means of nutrition will need to be reconsidered if pt unable to maintain adequate nutrition by mouth. Pt wishes to avoid feeding tubes if possible. Pt will benefit from skilled dysphagia intervention in acute rehab setting to maximize recovery. Prognosis for improvement is good given pt motivation, awareness, family support, and progress made thus far. Recommend repeat objective study in 5-7 days or as clinically appropriate.      CHL IP TREATMENT RECOMMENDATION 10/01/2014  Treatment Plan Recommendations Defer treatment plan to SLP at (Comment)     CHL IP DIET RECOMMENDATION 10/01/2014  Diet Recommendations Dysphagia 1 (Puree);Honey-thick liquid  Liquid Administration via Cup  Medication Administration Crushed with puree  Compensations Slow rate;Multiple dry swallows after each bite/sip;Effortful swallow  Postural Changes and/or Swallow Maneuvers Out of bed for meals;Seated upright 90 degrees;Upright 30-60 min after meal;Head turn right during swallow     CHL IP OTHER RECOMMENDATIONS 10/01/2014  Recommended Consults (None)  Oral Care Recommendations Oral care BID;Patient independent with oral care  Other Recommendations Have oral suction available     CHL IP FOLLOW UP RECOMMENDATIONS 10/01/2014  Follow up Recommendations Inpatient Rehab     CHL IP FREQUENCY AND DURATION 09/28/2014  Speech Therapy Frequency (ACUTE ONLY) min 3x  week  Treatment Duration 2 weeks     Pertinent Vitals/Pain VSS    SLP Swallow Goals No flowsheet data  found.  No flowsheet data found.    CHL IP REASON FOR REFERRAL 10/01/2014  Reason for Referral Objectively evaluate swallowing function     CHL IP ORAL PHASE 10/01/2014  Lips (None)  Tongue (None)  Mucous membranes (None)  Nutritional status (None)  Other (None)  Oxygen therapy (None)  Oral Phase WFL  Oral - Pudding Teaspoon (None)  Oral - Pudding Cup (None)  Oral - Honey Teaspoon (None)  Oral - Honey Cup (None)  Oral - Honey Syringe (None)  Oral - Nectar Teaspoon (None)  Oral - Nectar Cup (None)  Oral - Nectar Straw (None)  Oral - Nectar Syringe (None)  Oral - Ice Chips (None)  Oral - Thin Teaspoon (None)  Oral - Thin Cup (None)  Oral - Thin Straw (None)  Oral - Thin Syringe (None)  Oral - Puree (None)  Oral - Mechanical Soft (None)  Oral - Regular (None)  Oral - Multi-consistency (None)  Oral - Pill (None)  Oral Phase - Comment (None)      CHL IP PHARYNGEAL PHASE 10/01/2014  Pharyngeal Phase Impaired  Pharyngeal - Pudding Teaspoon (None)  Penetration/Aspiration details (pudding teaspoon) (None)  Pharyngeal - Pudding Cup (None)  Penetration/Aspiration details (pudding cup) (None)  Pharyngeal - Honey Teaspoon (None)  Penetration/Aspiration details (honey teaspoon) (None)  Pharyngeal - Honey Cup Delayed swallow initiation;Premature spillage to pyriform sinuses;Reduced pharyngeal peristalsis;Reduced epiglottic inversion;Reduced anterior laryngeal mobility;Reduced laryngeal elevation;Pharyngeal residue - pyriform sinuses;Lateral channel residue;Premature spillage to valleculae  Penetration/Aspiration details (honey cup) (None)  Pharyngeal - Honey Syringe (None)  Penetration/Aspiration details (honey syringe) (None)  Pharyngeal - Nectar Teaspoon (None)  Penetration/Aspiration details (nectar teaspoon) (None)  Pharyngeal - Nectar Cup Delayed swallow initiation;Premature spillage to pyriform sinuses;Premature spillage to valleculae;Reduced epiglottic inversion;Reduced  pharyngeal peristalsis;Reduced anterior laryngeal mobility;Reduced laryngeal elevation;Reduced airway/laryngeal closure;Penetration/Aspiration during swallow;Pharyngeal residue - pyriform sinuses;Lateral channel residue  Penetration/Aspiration details (nectar cup) Material enters airway, remains ABOVE vocal cords then ejected out  Pharyngeal - Nectar Straw (None)  Penetration/Aspiration details (nectar straw) (None)  Pharyngeal - Nectar Syringe (None)  Penetration/Aspiration details (nectar syringe) (None)  Pharyngeal - Ice Chips (None)  Penetration/Aspiration details (ice chips) (None)  Pharyngeal - Thin Teaspoon (None)  Penetration/Aspiration details (thin teaspoon) (None)  Pharyngeal - Thin Cup Delayed swallow initiation;Premature spillage to valleculae;Premature spillage to pyriform sinuses;Reduced pharyngeal peristalsis;Reduced epiglottic inversion;Reduced anterior laryngeal mobility;Reduced laryngeal elevation;Reduced airway/laryngeal closure;Penetration/Aspiration during swallow;Trace aspiration;Penetration/Aspiration before swallow;Pharyngeal residue - pyriform sinuses;Pharyngeal residue - valleculae;Lateral channel residue  Penetration/Aspiration details (thin cup) Material enters airway, remains ABOVE vocal cords then ejected out;Material enters airway, passes BELOW cords then ejected out  Pharyngeal - Thin Straw (None)  Penetration/Aspiration details (thin straw) (None)  Pharyngeal - Thin Syringe (None)  Penetration/Aspiration details (thin syringe') (None)  Pharyngeal - Puree Delayed swallow initiation;Premature spillage to valleculae;Reduced pharyngeal peristalsis;Reduced epiglottic inversion;Reduced anterior laryngeal mobility;Reduced laryngeal elevation;Reduced tongue base retraction;Pharyngeal residue - valleculae;Pharyngeal residue - pyriform sinuses  Penetration/Aspiration details (puree) (None)  Pharyngeal - Mechanical Soft Premature spillage to valleculae;Delayed swallow  initiation;Reduced epiglottic inversion;Reduced anterior laryngeal mobility;Reduced laryngeal elevation;Reduced tongue base retraction;Pharyngeal residue - valleculae;Pharyngeal residue - pyriform sinuses  Penetration/Aspiration details (mechanical soft) (None)  Pharyngeal - Regular (None)  Penetration/Aspiration details (regular) (None)  Pharyngeal - Multi-consistency (None)  Penetration/Aspiration details (multi-consistency) (None)  Pharyngeal - Pill (None)  Penetration/Aspiration details (pill) (None)  Pharyngeal Comment Head turn to right was effective in  reducing pharyngeal residuals on primary swallow     CHL IP CERVICAL ESOPHAGEAL PHASE 10/01/2014  Cervical Esophageal Phase Impaired  Pudding Teaspoon (None)  Pudding Cup (None)  Honey Teaspoon (None)  Honey Cup (None)  Honey Syringe (None)  Nectar Teaspoon (None)  Nectar Cup Reduced cricopharyngeal relaxation  Nectar Straw (None)  Nectar Syringe (None)  Thin Teaspoon (None)  Thin Cup (None)  Thin Straw (None)  Thin Syringe (None)  Cervical Esophageal Comment evidence of osteophytes, however more likely negatively impacted by poor hyolaryngeal excursion    No flowsheet data found.        Thank you,  Genene Churn, [place]  [place] 10/01/2014, 12:26 PM

## 2014-10-01 NOTE — Progress Notes (Signed)
Pt due for transfer to [facility]. Report given to Rayetta Pigg RN. All questions answered pertaining to patient. No further questions at this time. Pt will be transported with [facility].

## 2014-10-01 NOTE — Progress Notes (Signed)
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 Continues with  significant issues with dysphagia, commonly seen in this type of CVA.  Await MBS this am; suspect she will need a PEG as these type of CVAs tend to cause prolonged dysphagia. Appreciate input  CIR coordinator. Continue ASA for secondary stroke prevention. Continue statin. ECHO:Mild LVH with LVEF 60-65% and grade 1 diastolic dysfunction. Mildly thickened mitral leaflets with trivial mitralregurgitation. Unable to assess PASP. No obvious PFO or ASD. Carotid Dopplers: Minimal to moderate amount of bilateral atherosclerotic plaque, left subjectively greater than right, not resulting in a hemodynamically significant stenosis  HTN -Allow permissive HTN given acute CVA. SBP range 138-155, DBP range 76-99.  DM -Fair control. Hg A1c 8.9 Continue current regimen.  Hypokalemia - resolved this am. Mg level 2.3. Monitor and replete as indicated  Code Status: full Family Communication: none present Disposition Plan: home when ready   Consultants:  neurology  Procedures:  2 decho 1/15 mild LVH. Systolic function was normal. The estimated ejection fraction was in the range of 60% to 65%. Wall motion was normal; there were no regional wall motion abnormalities. Doppler parameters are consistent with abnormal left ventricular relaxation (grade 1 diastolic dysfunction).  Antibiotics:  none  HPI/Subjective: Awake alert reports being "tired"   Objective: Filed Vitals:   10/01/14 0938  BP: 147/78  Pulse: 74  Temp: 97.8 F (36.6 C)  Resp: 20    Intake/Output Summary (Last 24 hours) at 10/01/14 1024 Last data filed at 10/01/14 6010  Gross per 24 hour  Intake    600 ml  Output    650 ml  Net    -50 ml   Filed Weights   09/27/14 2142 09/28/14 0055  Weight: 68.947 kg  (152 lb) 70.67 kg (155 lb 12.8 oz)    Exam:   General:  Well nourished appears comfortable  Cardiovascular: RRR no MGR no LE edema  Respiratory: normal effort BS clear bilaterally no wheeze  Abdomen: soft +BS non-tender to palpation  Musculoskeletal: MAE no clubbing or cyanosis   Data Reviewed: Basic Metabolic Panel:  Recent Labs Lab 09/27/14 2154 09/27/14 2204 09/28/14 0547 09/30/14 0543 09/30/14 1730 10/01/14 0535  NA 132* 138 133* 138  --  138  K 3.1* 4.0 3.7 2.9*  --  3.5  CL 97 98 98 103  --  108  CO2 26  --  26 25  --  22  GLUCOSE 195* 199* 269* 139*  --  102*  BUN 13 15 10 20   --  21  CREATININE 0.89 0.80 0.55 0.71  --  0.65  CALCIUM 9.1  --  8.9 9.4  --  8.8  MG  --   --   --   --  2.3  --    Liver Function Tests:  Recent Labs Lab 09/27/14 2154  AST 66*  ALT 36*  ALKPHOS 139*  BILITOT 0.4  PROT 8.3  ALBUMIN 4.0   No results for input(s): LIPASE, AMYLASE in the last 168 hours. No results for input(s): AMMONIA in the last 168 hours. CBC:  Recent Labs Lab 09/27/14 2154 09/27/14 2204  WBC 11.6*  --   NEUTROABS 7.0  --   HGB 14.6 17.0*  HCT 44.3 50.0*  MCV 89.3  --   PLT 298  --    Cardiac Enzymes: No results for input(s): CKTOTAL, CKMB, CKMBINDEX, TROPONINI  in the last 168 hours. BNP (last 3 results) No results for input(s): PROBNP in the last 8760 hours. CBG:  Recent Labs Lab 09/30/14 0738 09/30/14 1140 09/30/14 1702 09/30/14 2117 10/01/14 0735  GLUCAP 142* 142* 118* 117* 118*    No results found for this or any previous visit (from the past 240 hour(s)).   Studies: No results found.  Scheduled Meds: . antiseptic oral rinse  7 mL Mouth Rinse BID  . aspirin EC  81 mg Oral Daily  . aspirin  300 mg Rectal Daily  . atorvastatin  20 mg Oral q1800  . enoxaparin (LOVENOX) injection  40 mg Subcutaneous Q24H  . insulin aspart  0-15 Units Subcutaneous TID WC  . insulin aspart  0-5 Units Subcutaneous QHS   Continuous  Infusions: . sodium chloride 75 mL/hr at 10/01/14 7893    Principal Problem:   CVA (cerebral infarction) Active Problems:   Invasive ductal carcinoma of left breast   Cardiomyopathy   COPD (chronic obstructive pulmonary disease)   TIA (transient ischemic attack)   HTN (hypertension)    Time spent: 35 minutes    [facility] Pager [phone]. If 7PM-7AM, please contact night-coverage at [email or web address], password [email or web address] 10/01/2014, 10:24 AM  LOS: 4 days

## 2014-10-01 NOTE — Care Management Note (Signed)
    Page 1 of 1   10/01/2014     2:24:46 PM CARE MANAGEMENT NOTE 10/01/2014  Patient:  VELVET, MOOMAW   Account Number:  [id number]  Date Initiated:  10/01/2014  Documentation initiated by:  Jolene Provost  Subjective/Objective Assessment:   Pt admitted with CVA. Pt is from home with self care.     Action/Plan:   Pt plans to discharge to CIR today. Soke with Jenine,  the CIR admissions coordinator, and pt has been approved and transportation will be arranged for 2pm today. No further CM needs at this time.   Anticipated DC Date:  10/01/2014   Anticipated DC Plan:  IP REHAB FACILITY      DC Planning Services  CM consult      Choice offered to / List presented to:             Status of service:  Completed, signed off Medicare Important Message given?  YES (If response is "NO", the following Medicare IM given date fields will be blank) Date Medicare IM given:  10/01/2014 Medicare IM given by:  Jolene Provost Date Additional Medicare IM given:   Additional Medicare IM given by:    Discharge Disposition:  IP REHAB FACILITY  Per UR Regulation:    If discussed at Long Length of Stay Meetings, dates discussed:    Comments:  10/01/2014 [place], RN, MSN, [facility]

## 2014-10-01 NOTE — Discharge Summary (Signed)
Physician Discharge Summary  Madison Todd DGU:[id number] DOB: [birth year] DOA: 09/27/2014  PCP: Maggie Font, MD  Admit date: 09/27/2014 Discharge date: 10/01/2014  Time spent: 40 minutes  Recommendations for Outpatient Follow-up:  1. Discharge to inpatient Rehab at [facility].   Discharge Diagnoses:  Principal Problem:   CVA (cerebral infarction) Active Problems:   Invasive ductal carcinoma of left breast   Cardiomyopathy   COPD (chronic obstructive pulmonary disease)   TIA (transient ischemic attack)   HTN (hypertension)   Discharge Condition: stable  Diet recommendation: pureed, honey thick with head turned to right.  Filed Weights   09/27/14 2142 09/28/14 0055  Weight: 68.947 kg (152 lb) 70.67 kg (155 lb 12.8 oz)    History of present illness:  30 old female who  has a past medical history of Leukocytoclastic vasculitis; Diabetes mellitus; Breast cancer; Left-sided Breast cancer (07/22/2011); Cardiomyopathy (07/22/2011); COPD (chronic obstructive pulmonary disease) (07/22/2011); Hypertension; Hyperlipidemia; Invasive ductal carcinoma of left breast (07/22/2011); and Breast cancer, right breast (12/16/2013).  Patient was brought to the ED on 09/27/14 after  started having right-sided headache which improved after taking Tylenol, but patient noticed that she was having gait imbalance on walking, and she was leaning towards right side. Though she denied slurred speech, no weakness of extremities, did not pass out no blurred vision. Patient's symptoms resolved when she reached ED. Denied headache. No chest pain shortness of breath. CT of the head was done in the ED, which was negative for stroke. Patient did take aspirin 81 mg by mouth daily.   Hospital Course:  Acute CVA -Right medullary and possibly cerebellar. Continues with significant issues with dysphagia, commonly seen in this type of CVA.  MBS done results as noted. ST recommending pureed diet with honey thick liquids and  head turned to right. Continue ASA for secondary stroke prevention. Continue statin. ECHO:Mild LVH with LVEF 60-65% and grade 1 diastolic dysfunction. Mildly thickened mitral leaflets with trivial mitralregurgitation. Unable to assess PASP. No obvious PFO or ASD. Carotid Dopplers: Minimal to moderate amount of bilateral atherosclerotic plaque, left subjectively greater than right, not resulting in a hemodynamically significant stenosis  HTN -Allow permissive HTN given acute CVA. SBP range 138-155, DBP range 76-99.  DM -Fair control. Hg A1c 8.9 Continue current regimen.  Hypokalemia - resolved this am. Mg level 2.3. Monitor and replete as indicated  Procedures:  2 decho 1/15 mild LVH. Systolic function was normal. The estimated ejection fraction was in the range of 60% to 65%. Wall motion was normal; there were no regional wall motion abnormalities. Doppler parameters are consistent with abnormal left ventricular relaxation (grade 1 diastolic dysfunction).  MBS 10/01/14  Consultations:  neuro  Discharge Exam: Filed Vitals:   10/01/14 0938  BP: 147/78  Pulse: 74  Temp: 97.8 F (36.6 C)  Resp: 20    General: well nourished appears comfortable Cardiovascular: RRR No MGR no LE edema Respiratory: normal effort BS clear bilaterally no wheez Neuro: alert oriented x3 speech clear, facial droop, MAE. 5/5 bilateral grip  Discharge Instructions    Current Discharge Medication List    CONTINUE these medications which have NOT CHANGED   Details  acetaminophen (TYLENOL) 325 MG tablet Take 325 mg by mouth at bedtime as needed (help relax, sleep).    Associated Diagnoses: Breast cancer, right; Shoulder pain, acute, right    aspirin EC 81 MG tablet Take 81 mg by mouth daily.    cholecalciferol (VITAMIN D) 1000 UNITS tablet Take 1,000 Units by mouth daily.  ibuprofen (ADVIL,MOTRIN) 200 MG tablet Take 200 mg by mouth every 6 (six) hours as needed for mild pain or moderate  pain.    metFORMIN (GLUCOPHAGE-XR) 500 MG 24 hr tablet Take 500 mg by mouth daily with breakfast.     Associated Diagnoses: Breast cancer; Leukocytoclastic vasculitis; Cardiomyopathy; COPD (chronic obstructive pulmonary disease)    metoprolol (TOPROL-XL) 50 MG 24 hr tablet Take 50 mg by mouth daily.     Associated Diagnoses: Breast cancer; Leukocytoclastic vasculitis; Cardiomyopathy; COPD (chronic obstructive pulmonary disease)    Olmesartan-Amlodipine-HCTZ (TRIBENZOR) 40-5-12.5 MG TABS Take 1 tablet by mouth daily.     Associated Diagnoses: Breast cancer; Leukocytoclastic vasculitis; Cardiomyopathy; COPD (chronic obstructive pulmonary disease)       Allergies  Allergen Reactions  . Lisinopril Other (See Comments)    Tongue swelling  . Aromasin [Exemestane] Rash      The results of significant diagnostics from this hospitalization (including imaging, microbiology, ancillary and laboratory) are listed below for reference.    Significant Diagnostic Studies: Ct Head Wo Contrast  09/27/2014   CLINICAL DATA:  Dizziness, headache, difficulty swallowing  EXAM: CT HEAD WITHOUT CONTRAST  TECHNIQUE: Contiguous axial images were obtained from the base of the skull through the vertex without intravenous contrast.  COMPARISON:  None.  FINDINGS: Gray-white differentiation is maintained. No CT evidence acute large territory infarct. No intraparenchymal or extra-axial mass or hemorrhage. Normal size and configuration of the ventricles and basilar cisterns. No midline shift. Limited visualization of the paranasal sinuses and mastoid air cells is normal. Regional soft tissues appear normal. No displaced calvarial fracture.  IMPRESSION: Negative noncontrast head CT.   Electronically Signed   By: Sandi Mariscal M.D.   On: 09/27/2014 22:48   Mr [name] Head Wo Contrast  09/28/2014   CLINICAL DATA:  Gait imbalance, leaning to the RIGHT side, with RIGHT-sided headache beginning 09/27/2014. Stroke risk factors include  hypertension, diabetes, and vasculitis. Also history of breast cancer.  EXAM: MRI HEAD WITHOUT CONTRAST  MRA HEAD WITHOUT CONTRAST  TECHNIQUE: Multiplanar, multiecho pulse sequences of the brain and surrounding structures were obtained without intravenous contrast. Angiographic images of the head were obtained using MRA technique without contrast.  COMPARISON:  None.  CT head 09/27/2014.  FINDINGS: MRI HEAD FINDINGS  The patient was unable to remain motionless for the exam. Small or subtle lesions could be overlooked.  There is a moderate-sized area of restricted diffusion affecting the dorsal lateral medulla on the RIGHT, sparing the RIGHT cerebellar tonsil with suspected punctate areas of restricted diffusion in the RIGHT lateral inferior cerebellar hemisphere, consistent with an acute RIGHT vertebral PICA distribution infarct.  No visible hemorrhage, mass lesion, hydrocephalus, or extra-axial fluid. Mild cerebral and cerebellar atrophy. Mild subcortical and periventricular T2 and FLAIR hyperintensities, likely chronic microvascular ischemic change. Flow voids are maintained in the who carotid, vertebral, and basilar arteries. Pituitary, pineal, and cerebellar tonsils unremarkable. No upper cervical lesions.  Visualized calvarium, skull base, and upper cervical osseous structures unremarkable. Scalp and extracranial soft tissues, orbits, sinuses, and mastoids show no acute process.  MRA HEAD FINDINGS  The internal carotid arteries are widely patent. The basilar artery is widely patent. The LEFT vertebral is dominant and widely patent.  The RIGHT vertebral is non dominant. There is some artifactual signal loss in the mid V4 segment with a suspected underlying 1 cm 75% stenosis. The origin of the RIGHT PICA is poorly visualized but is probably diseased, correlating with the observed pattern of cerebral infarction.  No  proximal stenosis of the anterior or middle cerebral arteries. No proximal PCA disease. Mild  irregularity of the distal MCA and PCA branches suggests intracranial atherosclerotic disease.  IMPRESSION: Acute RIGHT lateral medullary infarct, also with suspected inferior RIGHT lateral cerebellar hemisphere involvement.  Some artifactual signal loss of the distal RIGHT vertebral, with suspected underlying 1 cm long 75% stenosis. The origin of the RIGHT PICA is poorly visualized but is probably diseased as well.  Mild atrophy. Mild small vessel disease. No intracranial mass lesion is evident.   Electronically Signed   By: Rolla Flatten M.D.   On: 09/28/2014 09:10   Mri Brain Without Contrast  09/28/2014   CLINICAL DATA:  Gait imbalance, leaning to the RIGHT side, with RIGHT-sided headache beginning 09/27/2014. Stroke risk factors include hypertension, diabetes, and vasculitis. Also history of breast cancer.  EXAM: MRI HEAD WITHOUT CONTRAST  MRA HEAD WITHOUT CONTRAST  TECHNIQUE: Multiplanar, multiecho pulse sequences of the brain and surrounding structures were obtained without intravenous contrast. Angiographic images of the head were obtained using MRA technique without contrast.  COMPARISON:  None.  CT head 09/27/2014.  FINDINGS: MRI HEAD FINDINGS  The patient was unable to remain motionless for the exam. Small or subtle lesions could be overlooked.  There is a moderate-sized area of restricted diffusion affecting the dorsal lateral medulla on the RIGHT, sparing the RIGHT cerebellar tonsil with suspected punctate areas of restricted diffusion in the RIGHT lateral inferior cerebellar hemisphere, consistent with an acute RIGHT vertebral PICA distribution infarct.  No visible hemorrhage, mass lesion, hydrocephalus, or extra-axial fluid. Mild cerebral and cerebellar atrophy. Mild subcortical and periventricular T2 and FLAIR hyperintensities, likely chronic microvascular ischemic change. Flow voids are maintained in the who carotid, vertebral, and basilar arteries. Pituitary, pineal, and cerebellar tonsils  unremarkable. No upper cervical lesions.  Visualized calvarium, skull base, and upper cervical osseous structures unremarkable. Scalp and extracranial soft tissues, orbits, sinuses, and mastoids show no acute process.  MRA HEAD FINDINGS  The internal carotid arteries are widely patent. The basilar artery is widely patent. The LEFT vertebral is dominant and widely patent.  The RIGHT vertebral is non dominant. There is some artifactual signal loss in the mid V4 segment with a suspected underlying 1 cm 75% stenosis. The origin of the RIGHT PICA is poorly visualized but is probably diseased, correlating with the observed pattern of cerebral infarction.  No proximal stenosis of the anterior or middle cerebral arteries. No proximal PCA disease. Mild irregularity of the distal MCA and PCA branches suggests intracranial atherosclerotic disease.  IMPRESSION: Acute RIGHT lateral medullary infarct, also with suspected inferior RIGHT lateral cerebellar hemisphere involvement.  Some artifactual signal loss of the distal RIGHT vertebral, with suspected underlying 1 cm long 75% stenosis. The origin of the RIGHT PICA is poorly visualized but is probably diseased as well.  Mild atrophy. Mild small vessel disease. No intracranial mass lesion is evident.   Electronically Signed   By: Rolla Flatten M.D.   On: 09/28/2014 09:10   US Carotid Bilateral  09/28/2014   CLINICAL DATA:  Hypertension, stroke, syncopal episode, hyperlipidemia, diabetes and smoking.  EXAM: BILATERAL CAROTID DUPLEX ULTRASOUND  TECHNIQUE: Pearline Cables scale imaging, color Doppler and duplex ultrasound were performed of bilateral carotid and vertebral arteries in the neck.  COMPARISON:  None.  FINDINGS: Criteria: Quantification of carotid stenosis is based on velocity parameters that correlate the residual internal carotid diameter with NASCET-based stenosis levels, using the diameter of the distal internal carotid lumen as the denominator for  stenosis measurement.  The  following velocity measurements were obtained:  RIGHT  ICA:  52/17 cm/sec  CCA:  35/36 cm/sec  SYSTOLIC ICA/CCA RATIO:  0.7  DIASTOLIC ICA/CCA RATIO:  1.5  ECA:  92 cm/sec  LEFT  ICA:  69/21 cm/sec  CCA:  14/43 cm/sec  SYSTOLIC ICA/CCA RATIO:  1.4  DIASTOLIC ICA/CCA RATIO:  2.0  ECA:  62 cm/sec  RIGHT CAROTID ARTERY: There is a minimal amount of eccentric hypoechoic plaque within the right carotid bulb (image 16 and 17). There is a minimal of intimal thickening involving the origin and proximal aspects of the right internal carotid artery (image 25), not resulting in elevated peak systolic velocities with the interrogated course of the right internal carotid artery to suggest a hemodynamically significant stenosis.  RIGHT VERTEBRAL ARTERY:  Antegrade flow  LEFT CAROTID ARTERY: There is a minimal amount of eccentric mixed echogenic plaque within the left carotid bulb (image 51). There is a moderate amount of eccentric mixed echogenic plaque involving the origin and proximal aspect of the left internal carotid artery (image 59), not resulting in elevated peak systolic velocities with the interrogated course of the left internal carotid artery to suggest a hemodynamically significant stenosis.  LEFT VERTEBRAL ARTERY:  Antegrade flow  IMPRESSION: Minimal to moderate amount of bilateral atherosclerotic plaque, left subjectively greater than right, not resulting in a hemodynamically significant stenosis.   Electronically Signed   By: Sandi Mariscal M.D.   On: 09/28/2014 09:13    Microbiology: No results found for this or any previous visit (from the past 240 hour(s)).   Labs: Basic Metabolic Panel:  Recent Labs Lab 09/27/14 2154 09/27/14 2204 09/28/14 0547 09/30/14 0543 09/30/14 1730 10/01/14 0535  NA 132* 138 133* 138  --  138  K 3.1* 4.0 3.7 2.9*  --  3.5  CL 97 98 98 103  --  108  CO2 26  --  26 25  --  22  GLUCOSE 195* 199* 269* 139*  --  102*  BUN 13 15 10 20   --  21  CREATININE 0.89 0.80 0.55  0.71  --  0.65  CALCIUM 9.1  --  8.9 9.4  --  8.8  MG  --   --   --   --  2.3  --    Liver Function Tests:  Recent Labs Lab 09/27/14 2154  AST 66*  ALT 36*  ALKPHOS 139*  BILITOT 0.4  PROT 8.3  ALBUMIN 4.0   No results for input(s): LIPASE, AMYLASE in the last 168 hours. No results for input(s): AMMONIA in the last 168 hours. CBC:  Recent Labs Lab 09/27/14 2154 09/27/14 2204  WBC 11.6*  --   NEUTROABS 7.0  --   HGB 14.6 17.0*  HCT 44.3 50.0*  MCV 89.3  --   PLT 298  --    Cardiac Enzymes: No results for input(s): CKTOTAL, CKMB, CKMBINDEX, TROPONINI in the last 168 hours. BNP: BNP (last 3 results) No results for input(s): PROBNP in the last 8760 hours. CBG:  Recent Labs Lab 09/30/14 0738 09/30/14 1140 09/30/14 1702 09/30/14 2117 10/01/14 0735  GLUCAP 142* 142* 118* 117* 118*       Signed:  Aloysuis Ribaudo M  [facility] 10/01/2014, 11:08 AM

## 2014-10-01 NOTE — PMR Pre-admission (Signed)
PMR Admission Coordinator Pre-Admission Assessment  Patient: Madison Todd is an 64 y.o., female MRN: [id number] DOB: 30-Apr-1951 Height: _0  (162.6 cm) Weight: 70.67 kg (155 lb 12.8 oz)              Insurance Information  PRIMARY: [name] Silverback      Policy#: [id number]      Subscriber: self CM Name: Clayborne Artist, RN      Phone#: [phone]     Fax#: [phone] Approval for CIR given on 10-01-14 through 10-14-14 and follow up will be with onsite reviewer Shae Pre-Cert#: [id number]      Employer: retired Benefits:  Phone #: [phone]     Name: Lorenz Coaster. Date: 09-14-13     Deduct: none      Out of Pocket Max: $5500      Life Max: unlimited CIR: $275/day for days 1-7, pre-auth needed      SNF: $0/day for days 1-20; $160/day for days 21-100 (100 day visit max, pre-auth needed) Outpatient: 100%     Co-Pay: $45 copay for initial eval, then $10 copay/visit; visit limit based on med necessity Home Health: 100%      Co-Pay: none, visit limits based on med necessity DME: 80%     Co-Pay: 20% Providers: in Therapist, art Information    Name Relation Home Work Mobile   Slade,Charles "Nate" Son   [phone]   Lemar Livings   [phone]     Current Medical History  Patient Admitting Diagnosis: Medulla and Cerebellar CVA History of Present Illness: 64 year old right handed female with history of leukocytoclastic vasculitis, diabetes mellitus and peripheral neuropathy, cardiomyopathy, COPD/tobacco abuse, hypertension and bilateral breast cancer with lumpectomy as well as chemoradiation. Presented 09/27/2014 with severe headache, blurred vision, right-sided weakness and difficulty in swallowing. Initial cranial CT scan negative. MRI of the brain with acute right lateral medullary infarct as well as suspected inferior right lateral cerebellar hemisphere involvement. MRA of the head with some artifactual signal loss in the mid V4 segment with a  suspected underlying 1 cm 75% stenosis on the right. Echocardiogram with ejection fraction of 62% grade 1 diastolic dysfunction. Carotid Dopplers with no ICA stenosis. Patient did not receive TPA.  Neurology consulted(Dr.Doonquah) maintain on aspirin for CVA prophylaxis as well as subcutaneous Lovenox for DVT prophylaxis. Noted dysphagia and has been nothing by mouth with modified barium swallow 10/01/2014. Bouts of hypokalemia resolved with potassium supplement. Hemoglobin A1c of 8.9 with insulin therapy as directed. Physical and occupational therapy evaluations completed.Patient was admitted for a comprehensive rehab program.  NIH Total: 3  Past Medical History  Past Medical History  Diagnosis Date  . Leukocytoclastic vasculitis   . Diabetes mellitus   . Breast cancer     left 2007/right 1997  . Left-sided Breast cancer 07/22/2011  . Cardiomyopathy 07/22/2011  . COPD (chronic obstructive pulmonary disease) 07/22/2011  . Hypertension   . Hyperlipidemia   . 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  Stage II (T2 N0).  ER 73%, PR 90%, Her2 positive, Ki-67 19%.  2.1 cm in size.  Agreed to only have radiation, Herceptin, and Aromasin.  Right sided breast cancer: Stage II (T2 N0 M0).  2.5 cm in size.  Surgery on 04/28/1996 with axillary dissection on 05/10/1996.  11 negative nodes.  Treated with AC x 4 cycles for ER negative, PR positive at only 20%.    . Breast  cancer, right breast 12/16/2013    Family History  family history includes Cancer in her maternal aunt and paternal aunt; Diabetes in her father. There is no history of Colon cancer.  Prior Rehab/Hospitalizations: none except pt did have mastectomy/chemo and reconstruction surgery in 2012.   Current Medications   Current facility-administered medications:  .  0.9 %  sodium chloride infusion, , Intravenous, Continuous, Erline Hau, MD, Last Rate: 75 mL/hr at 10/01/14  0553 .  acetaminophen (TYLENOL) tablet 325 mg, 325 mg, Oral, QHS PRN, Oswald Hillock, MD, 325 mg at 09/27/14 2356 .  antiseptic oral rinse (CPC / CETYLPYRIDINIUM CHLORIDE 0.05%) solution 7 mL, 7 mL, Mouth Rinse, BID, Oswald Hillock, MD, 7 mL at 10/01/14 1000 .  aspirin EC tablet 81 mg, 81 mg, Oral, Daily, Oswald Hillock, MD, 81 mg at 09/29/14 0932 .  aspirin suppository 300 mg, 300 mg, Rectal, Daily, Lezlie Octave Black, NP, 300 mg at 10/01/14 1108 .  atorvastatin (LIPITOR) tablet 20 mg, 20 mg, Oral, q1800, Erline Hau, MD, 20 mg at 09/30/14 1800 .  enoxaparin (LOVENOX) injection 40 mg, 40 mg, Subcutaneous, Q24H, Oswald Hillock, MD, 40 mg at 10/01/14 1054 .  insulin aspart (novoLOG) injection 0-15 Units, 0-15 Units, Subcutaneous, TID WC, Radene Gunning, NP, 2 Units at 09/30/14 1156 .  insulin aspart (novoLOG) injection 0-5 Units, 0-5 Units, Subcutaneous, QHS, Radene Gunning, NP, 0 Units at 09/28/14 2226 .  ondansetron (ZOFRAN) injection 4 mg, 4 mg, Intravenous, Q6H PRN, Oswald Hillock, MD, 4 mg at 09/28/14 0126  Patients Current Diet: DIET - DYS 1, honey thick, 100% supervision for all meals, head turn to right for swallows, have oral suction available, discontinue PO if fatigued.  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 Pt enjoys doing slots at the [employer] in her free time.   Home Assistive Devices / Equipment Home Assistive Devices/Equipment: CBG Meter Home Equipment: None  Prior Functional Level Prior Function Level of Independence: Independent  Current Functional Level Cognition  Overall Cognitive Status: Within Functional Limits for tasks assessed Orientation Level: Oriented X4    Extremity Assessment (includes  Sensation/Coordination)          ADLs  General ADL Comments: able to simulate washing her face and hands independently after setup.  Patient demonstrating fair - to poor dynamic sitting balance, will need extensive assistance with lower extremity ADLs.    Mobility  Overal bed mobility: Needs Assistance Bed Mobility: Rolling, Sidelying to Sit Rolling: Modified independent (Device/Increase time) Sidelying to sit: Min assist General bed mobility comments: Patient able to perform rolling and scooting up/over in bed without difficulty; able to complete partial supine to sit but requires assist tomaintain sitting    Transfers  Overall transfer level: Needs assistance Equipment used: Rolling walker (2 wheeled) Sit to Stand: Mod assist General transfer comment: able to complete sit to stand with Mod assist and maintain standing with min assistance and ront weheeled walker. no complaint of dizzinessdifficulty weight shifting to Lt withotu therapst assistance.     Ambulation / Gait / Stairs / Wheelchair Mobility  Ambulation/Gait Ambulation/Gait assistance: Museum/gallery curator (Feet): 3 Feet Assistive device: Rolling walker (2 wheeled) Gait velocity interpretation: <1.8 ft/sec, indicative of risk for recurrent falls  General Gait Details: 4 steps taken to  sit in up in chair with min assist for maintenance of balance    Posture / Balance Dynamic Sitting Balance Sitting balance - Comments: patient sat forward in bed from [facility] raised position.  Patient leaning to right, however, able to correct to near midline with increased time to complete.      Special needs/care consideration BiPAP/CPAP no CPM no  Continuous Drip IV no  Dialysis no         Life Vest no  Oxygen no  Special Bed no  Trach Size no  Wound Vac (area) no       Skin - no current issues                               Bowel mgmt: last BM on 09-27-14 Bladder mgmt: using bedpan  Diabetic mgmt - managed at home with meds    Previous Home Environment Living Arrangements: Other relatives Available Help at Discharge: Family Type of Home: House Home Layout: One level Home Access: Stairs to enter Entrance Stairs-Rails:  (unknown) Entrance Stairs-Number of Steps: 1 in back, 3 in front [place]: No Additional Comments: Patient does not appear to have any DME for use at home  Discharge Living Setting Plans for Discharge Living Setting: Patient's home, Mobile Home (doube wide mobile home) Type of Home at Discharge: Mobile home Discharge Home Layout: One level Discharge Home Access: Stairs to enter Entrance Stairs-Rails: Can reach both Entrance Stairs-Number of Steps: 3 Does the patient have any problems obtaining your medications?: No  Social/Family/Support Systems Patient Roles: Caregiver (involved with her family and cares for her Aunt who has dementia. Aunt does not need physical help, just supervision) Contact Information: sons are primary contact (I spoke with Nate) Anticipated Caregiver: two sons and extended family Anticipated Caregiver's Contact Information: see above Ability/Limitations of Caregiver: sons do work but will make a plan to get needed help for their mom (and Aunt with dementia as well). Extended family is also supportive. Caregiver Availability: 24/7 Discharge Plan Discussed with Primary Caregiver: Yes Is Caregiver In Agreement with Plan?: Yes Does Caregiver/Family have Issues with Lodging/Transportation while Pt is in Rehab?: No  Goals/Additional Needs Patient/Family Goal for Rehab: Supervision with PT and SLP; Supervision and Min assist with OT Expected length of stay: 11-14 days Cultural Considerations: none Dietary Needs: DYS 1, honey thick, 100% supervision for all meals, head turn to right for swallows, have oral suction available, discontinue PO if fatigued. Equipment Needs: to be determined Pt/Family Agrees to Admission and willing to participate: Yes (spoke with  pt's son Nate by phone on 09-28-14) Program Orientation Provided & Reviewed with Pt/Caregiver Including Roles  & Responsibilities: Yes   Decrease burden of Care through IP rehab admission: NA   Possible need for SNF placement upon discharge: not anticipated   Patient Condition: This patient's medical and functional status has changed since the consult dated: 09-28-14 in which the Rehabilitation Physician determined and documented that the patient's condition is appropriate for intensive rehabilitative care in an inpatient rehabilitation facility. See "History of Present Illness" (above) for medical update. Functional changes are: minimal assistance with limited gait and significant deficits with self care tasks due to balance issues. Patient's medical and functional status update has been discussed with the Rehabilitation physician and patient remains appropriate for inpatient rehabilitation. Will admit to inpatient rehab today.  Preadmission Screen Completed By:  Sherlyn Hay  [place], PT, 10/01/2014 11:51 AM ______________________________________________________________________   Discussed status with Dr. Naaman Plummer on 10-01-14 at 1151 and received telephone approval for admission today.  Admission Coordinator:  Nanetta Batty, PT, time1151/Date1-18-16   This patient is being admitted for inpatient, interdisciplinary rehab today with a diagnosis of medullary and cerebellar infarcts. Goals are supervision to min assist. ELOS is 11-14 days. Patient is quite motivated. Good family supports.   [name], MD, [facility] 10/01/2014

## 2014-10-02 ENCOUNTER — Inpatient Hospital Stay (HOSPITAL_COMMUNITY): Payer: Commercial Managed Care - HMO | Admitting: Speech Pathology

## 2014-10-02 ENCOUNTER — Inpatient Hospital Stay (HOSPITAL_COMMUNITY): Payer: Commercial Managed Care - HMO | Admitting: *Deleted

## 2014-10-02 ENCOUNTER — Encounter (HOSPITAL_COMMUNITY): Payer: Self-pay

## 2014-10-02 ENCOUNTER — Inpatient Hospital Stay (HOSPITAL_COMMUNITY): Payer: Commercial Managed Care - HMO | Admitting: Occupational Therapy

## 2014-10-02 DIAGNOSIS — G463 Brain stem stroke syndrome: Secondary | ICD-10-CM

## 2014-10-02 LAB — CBC WITH DIFFERENTIAL/PLATELET
Basophils Absolute: 0 10*3/uL (ref 0.0–0.1)
Basophils Relative: 0 % (ref 0–1)
Eosinophils Absolute: 0.1 10*3/uL (ref 0.0–0.7)
Eosinophils Relative: 1 % (ref 0–5)
HEMATOCRIT: 44.4 % (ref 36.0–46.0)
Hemoglobin: 14.5 g/dL (ref 12.0–15.0)
Lymphocytes Relative: 22 % (ref 12–46)
Lymphs Abs: 2.4 10*3/uL (ref 0.7–4.0)
MCH: 28.8 pg (ref 26.0–34.0)
MCHC: 32.7 g/dL (ref 30.0–36.0)
MCV: 88.1 fL (ref 78.0–100.0)
MONOS PCT: 7 % (ref 3–12)
Monocytes Absolute: 0.8 10*3/uL (ref 0.1–1.0)
NEUTROS PCT: 70 % (ref 43–77)
Neutro Abs: 7.4 10*3/uL (ref 1.7–7.7)
Platelets: 272 10*3/uL (ref 150–400)
RBC: 5.04 MIL/uL (ref 3.87–5.11)
RDW: 14.2 % (ref 11.5–15.5)
WBC: 10.6 10*3/uL — ABNORMAL HIGH (ref 4.0–10.5)

## 2014-10-02 LAB — COMPREHENSIVE METABOLIC PANEL
ALK PHOS: 104 U/L (ref 39–117)
ALT: 18 U/L (ref 0–35)
ANION GAP: 11 (ref 5–15)
AST: 19 U/L (ref 0–37)
Albumin: 3.3 g/dL — ABNORMAL LOW (ref 3.5–5.2)
BUN: 14 mg/dL (ref 6–23)
CO2: 23 mmol/L (ref 19–32)
CREATININE: 0.65 mg/dL (ref 0.50–1.10)
Calcium: 9 mg/dL (ref 8.4–10.5)
Chloride: 108 mEq/L (ref 96–112)
GFR calc non Af Amer: >90 mL/min (ref >90)
GLUCOSE: 142 mg/dL — AB (ref 70–99)
Potassium: 3.3 mmol/L — ABNORMAL LOW (ref 3.5–5.1)
Sodium: 142 mmol/L (ref 135–145)
Total Bilirubin: 0.7 mg/dL (ref 0.3–1.2)
Total Protein: 7.3 g/dL (ref 6.0–8.3)

## 2014-10-02 LAB — GLUCOSE, CAPILLARY
GLUCOSE-CAPILLARY: 162 mg/dL — AB (ref 70–99)
Glucose-Capillary: 147 mg/dL — ABNORMAL HIGH (ref 70–99)
Glucose-Capillary: 103 mg/dL — ABNORMAL HIGH (ref 70–99)
Glucose-Capillary: 183 mg/dL — ABNORMAL HIGH (ref 70–99)

## 2014-10-02 MED ORDER — POTASSIUM CHLORIDE 20 MEQ/15ML (10%) PO SOLN
20.0000 meq | Freq: Every day | ORAL | Status: DC
Start: 2014-10-02 — End: 2014-10-04
  Administered 2014-10-03 – 2014-10-04 (×2): 20 meq via ORAL
  Filled 2014-10-02 (×4): qty 15

## 2014-10-02 MED ORDER — SODIUM CHLORIDE 0.9 % IV SOLN
INTRAVENOUS | Status: AC
  Administered 2014-10-02: 19:00:00 via INTRAVENOUS

## 2014-10-02 MED ORDER — POTASSIUM CHLORIDE CRYS ER 20 MEQ PO TBCR
20.0000 meq | EXTENDED_RELEASE_TABLET | Freq: Every day | ORAL | Status: DC
  Filled 2014-10-02 (×3): qty 1

## 2014-10-02 NOTE — Care Management Utilization Note (Signed)
UR completed 

## 2014-10-02 NOTE — Progress Notes (Signed)
Madison Staggers, MD Physician Signed Physical Medicine and Rehabilitation PMR Pre-admission 10/01/2014 11:05 AM  Related encounter: ED to Hosp-Admission (Discharged) from 09/27/2014 in [facility]   PMR Admission Coordinator Pre-Admission Assessment  Patient: Madison Todd is an 64 y.o., female MRN: [id number] DOB: 08-06-1951 Height: 5' 4" (162.6 cm) Weight: 70.67 kg (155 lb 12.8 oz)  Insurance Information  PRIMARY: Madison Todd Silverback Policy#: [id number] Subscriber: self CM Name: Madison Artist, RN Phone#: [phone] Fax#: [phone] Approval for CIR given on 10-01-14 through 10-14-14 and follow up will be with onsite reviewer Madison Todd Pre-Cert#: [id number] Employer: retired Benefits: Phone #: [phone] Name: Madison Todd. Date: 09-14-13 Deduct: none Out of Pocket Max: $5500 Life Max: unlimited CIR: $275/day for days 1-7, pre-auth needed SNF: $0/day for days 1-20; $160/day for days 21-100 (100 day visit max, pre-auth needed) Outpatient: 100% Co-Pay: $45 copay for initial eval, then $10 copay/visit; visit limit based on med necessity Home Health: 100% Co-Pay: none, visit limits based on med necessity DME: 80% Co-Pay: 20% Providers: in Therapist, art Information    Name Relation Home Work Mobile   Madison "Nate" Son   [phone]   Madison Todd   [phone]     Current Medical History  Patient Admitting Diagnosis: Medulla and Cerebellar CVA History of Present Illness: 64 year old right handed female with history of leukocytoclastic vasculitis, diabetes mellitus and peripheral neuropathy, cardiomyopathy, COPD/tobacco abuse, hypertension and bilateral breast cancer with  lumpectomy as well as chemoradiation. Presented 09/27/2014 with severe headache, blurred vision, right-sided weakness and difficulty in swallowing. Initial cranial CT scan negative. MRI of the brain with acute right lateral medullary infarct as well as suspected inferior right lateral cerebellar hemisphere involvement. MRA of the head with some artifactual signal loss in the mid V4 segment with a suspected underlying 1 cm 75% stenosis on the right. Echocardiogram with ejection fraction of 92% grade 1 diastolic dysfunction. Carotid Dopplers with no ICA stenosis. Patient did not receive TPA.  Neurology consulted(Dr.Doonquah) maintain on aspirin for CVA prophylaxis as well as subcutaneous Lovenox for DVT prophylaxis. Noted dysphagia and has been nothing by mouth with modified barium swallow 10/01/2014. Bouts of hypokalemia resolved with potassium supplement. Hemoglobin A1c of 8.9 with insulin therapy as directed. Physical and occupational therapy evaluations completed.Patient was admitted for a comprehensive rehab program.  NIH Total: 3  Past Medical History  Past Medical History  Diagnosis Date  . Leukocytoclastic vasculitis   . Diabetes mellitus   . Breast cancer     left 2007/right 1997  . Left-sided Breast cancer 07/22/2011  . Cardiomyopathy 07/22/2011  . COPD (chronic obstructive pulmonary disease) 07/22/2011  . Hypertension   . Hyperlipidemia   . 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 Stage II (T2 N0). ER 73%, PR 90%, Her2 positive, Ki-67 19%. 2.1 cm in size. Agreed to only have radiation, Herceptin, and Aromasin. Right sided breast cancer: Stage II (T2 N0 M0). 2.5 cm in size. Surgery on 04/28/1996 with axillary dissection on 05/10/1996. 11 negative nodes. Treated with AC x 4 cycles for ER negative, PR positive at only 20%.   . Breast cancer, right breast 12/16/2013    Family History    family history includes Cancer in her maternal aunt and paternal aunt; Diabetes in her father. There is no history of Colon cancer.  Prior Rehab/Hospitalizations: none except pt did have mastectomy/chemo and  reconstruction surgery in 2012.  Current Medications   Current facility-administered medications:  . 0.9 % sodium chloride infusion, , Intravenous, Continuous, Erline Hau, MD, Last Rate: 75 mL/hr at 10/01/14 0553 . acetaminophen (TYLENOL) tablet 325 mg, 325 mg, Oral, QHS PRN, Oswald Hillock, MD, 325 mg at 09/27/14 2356 . antiseptic oral rinse (CPC / CETYLPYRIDINIUM CHLORIDE 0.05%) solution 7 mL, 7 mL, Mouth Rinse, BID, Oswald Hillock, MD, 7 mL at 10/01/14 1000 . aspirin EC tablet 81 mg, 81 mg, Oral, Daily, Oswald Hillock, MD, 81 mg at 09/29/14 0932 . aspirin suppository 300 mg, 300 mg, Rectal, Daily, Lezlie Octave Black, NP, 300 mg at 10/01/14 1108 . atorvastatin (LIPITOR) tablet 20 mg, 20 mg, Oral, q1800, Erline Hau, MD, 20 mg at 09/30/14 1800 . enoxaparin (LOVENOX) injection 40 mg, 40 mg, Subcutaneous, Q24H, Oswald Hillock, MD, 40 mg at 10/01/14 1054 . insulin aspart (novoLOG) injection 0-15 Units, 0-15 Units, Subcutaneous, TID WC, Radene Gunning, NP, 2 Units at 09/30/14 1156 . insulin aspart (novoLOG) injection 0-5 Units, 0-5 Units, Subcutaneous, QHS, Radene Gunning, NP, 0 Units at 09/28/14 2226 . ondansetron (ZOFRAN) injection 4 mg, 4 mg, Intravenous, Q6H PRN, Oswald Hillock, MD, 4 mg at 09/28/14 0126  Patients Current Diet: DIET - DYS 1, honey thick, 100% supervision for all meals, head turn to right for swallows, have oral suction available, discontinue PO if fatigued.  Precautions / Restrictions Precautions Precautions: Fall, Other (comment) (aspiration - patient set up with suction) Restrictions Weight Bearing Restrictions: No   Prior Activity Level Community (5-7x/wk): Pt was very active prior to admit and was primary caregiver for her Aunt  who had dementia. Pt enjoys doing slots at the [employer] in her free time.   Home Assistive Devices / Equipment Home Assistive Devices/Equipment: CBG Meter Home Equipment: None  Prior Functional Level Prior Function Level of Independence: Independent  Current Functional Level Cognition  Overall Cognitive Status: Within Functional Limits for tasks assessed Orientation Level: Oriented X4   Extremity Assessment (includes Sensation/Coordination)          ADLs  General ADL Comments: able to simulate washing her face and hands independently after setup. Patient demonstrating fair - to poor dynamic sitting balance, will need extensive assistance with lower extremity ADLs.    Mobility  Overal bed mobility: Needs Assistance Bed Mobility: Rolling, Sidelying to Sit Rolling: Modified independent (Device/Increase time) Sidelying to sit: Min assist General bed mobility comments: Patient able to perform rolling and scooting up/over in bed without difficulty; able to complete partial supine to sit but requires assist tomaintain sitting    Transfers  Overall transfer level: Needs assistance Equipment used: Rolling walker (2 wheeled) Sit to Stand: Mod assist General transfer comment: able to complete sit to stand with Mod assist and maintain standing with min assistance and ront weheeled walker. no complaint of dizzinessdifficulty weight shifting to Lt withotu therapst assistance.     Ambulation / Gait / Stairs / Wheelchair Mobility  Ambulation/Gait Ambulation/Gait assistance: Museum/gallery curator (Feet): 3 Feet Assistive device: Rolling walker (2 wheeled) Gait velocity interpretation: <1.8 ft/sec, indicative of risk for recurrent falls General Gait Details: 4 steps taken to sit in up in chair with min assist for maintenance of balance    Posture / Balance Dynamic Sitting Balance Sitting balance - Comments: patient sat forward in bed from [facility] raised position.  Patient leaning to right, however, able to correct to near midline with increased time to  complete.     Special needs/care consideration BiPAP/CPAP no CPM no  Continuous Drip IV no  Dialysis no  Life Vest no  Oxygen no  Special Bed no  Trach Size no  Wound Vac (area) no  Skin - no current issues  Bowel mgmt: last BM on 09-27-14 Bladder mgmt: using bedpan  Diabetic mgmt - managed at home with meds   Previous Home Environment Living Arrangements: Other relatives Available Help at Discharge: Family Type of Home: House Home Layout: One level Home Access: Stairs to enter Entrance Stairs-Rails: (unknown) Entrance Stairs-Number of Steps: 1 in back, 3 in front Eugenio Saenz: No Additional Comments: Patient does not appear to have any DME for use at home  Discharge Living Setting Plans for Discharge Living Setting: Patient's home, Mobile Home (doube wide mobile home) Type of Home at Discharge: Mobile home Discharge Home Layout: One level Discharge Home Access: Stairs to enter Entrance Stairs-Rails: Can reach both Entrance Stairs-Number of Steps: 3 Does the patient have any problems obtaining your medications?: No  Social/Family/Support Systems Patient Roles: Caregiver (involved with her family and cares for her Aunt who has dementia. Aunt does not need physical help, just supervision) Contact Information: sons are primary contact (I spoke with Nate) Anticipated Caregiver: two sons and extended family Anticipated Caregiver's Contact Information: see above Ability/Limitations of Caregiver: sons do work but will make a plan to get needed help for their mom (and Aunt with dementia as well). Extended family is also supportive. Caregiver Availability: 24/7 Discharge Plan Discussed with Primary Caregiver: Yes Is Caregiver In Agreement with Plan?: Yes Does Caregiver/Family have Issues with Lodging/Transportation while Pt is in  Rehab?: No  Goals/Additional Needs Patient/Family Goal for Rehab: Supervision with PT and SLP; Supervision and Min assist with OT Expected length of stay: 11-14 days Cultural Considerations: none Dietary Needs: DYS 1, honey thick, 100% supervision for all meals, head turn to right for swallows, have oral suction available, discontinue PO if fatigued. Equipment Needs: to be determined Pt/Family Agrees to Admission and willing to participate: Yes (spoke with pt's son Nate by phone on 09-28-14) Program Orientation Provided & Reviewed with Pt/Caregiver Including Roles & Responsibilities: Yes   Decrease burden of Care through IP rehab admission: NA   Possible need for SNF placement upon discharge: not anticipated   Patient Condition: This patient's medical and functional status has changed since the consult dated: 09-28-14 in which the Rehabilitation Physician determined and documented that the patient's condition is appropriate for intensive rehabilitative care in an inpatient rehabilitation facility. See "History of Present Illness" (above) for medical update. Functional changes are: minimal assistance with limited gait and significant deficits with self care tasks due to balance issues. Patient's medical and functional status update has been discussed with the Rehabilitation physician and patient remains appropriate for inpatient rehabilitation. Will admit to inpatient rehab today.  Preadmission Screen Completed By: Nanetta Batty, PT, 10/01/2014 11:51 AM ______________________________________________________________________  Discussed status with Dr. Naaman Plummer on 10-01-14 at 1151 and received telephone approval for admission today.  Admission Coordinator: Nanetta Batty, PT, time1151/Date1-18-16   This patient is being admitted for inpatient, interdisciplinary rehab today with a diagnosis of medullary and cerebellar infarcts. Goals are supervision to min assist. ELOS is 11-14 days.  Patient is quite motivated. Good family supports.   Madison Staggers, MD, [facility] 10/01/2014       Revision History     Date/Time User Provider Type Action   10/01/2014 11:58 AM  Madison Staggers, MD Physician Sign   10/01/2014 11:52 AM [facility] Admission Coordinator Sign   View Details Report

## 2014-10-02 NOTE — Progress Notes (Signed)
Social Work Assessment and Plan Social Work Assessment and Plan  Patient Details  Name: Madison Todd MRN: [id number] Date of Birth: [birth year]  Today's Date: 10/02/2014  Problem List:  Patient Active Problem List   Diagnosis Date Noted  . 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 Lacunar infarction 10/01/2014  . 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 CVA (cerebral infarction) 09/28/2014  . TIA (transient ischemic attack) 09/27/2014  . HTN (hypertension) 09/27/2014  . Breast cancer, right breast 12/16/2013  . 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 Adenomatous polyp 10/02/2013  . 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 Cardiomyopathy 07/22/2011  . COPD (chronic obstructive pulmonary disease) 07/22/2011   Past Medical History:  Past Medical History  Diagnosis Date  . Leukocytoclastic vasculitis   . Diabetes mellitus   . Breast cancer     left 2007/right 1997  . Left-sided Breast cancer 07/22/2011  . Cardiomyopathy 07/22/2011  . COPD (chronic obstructive pulmonary disease) 07/22/2011  . Hypertension   . Hyperlipidemia   . 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  Stage II (T2 N0).  ER 73%, PR 90%, Her2 positive, Ki-67 19%.  2.1 cm in size.  Agreed to only have radiation, Herceptin, and Aromasin.  Right sided breast cancer: Stage II (T2 N0 M0).  2.5 cm in size.  Surgery on 04/28/1996 with axillary dissection on 05/10/1996.  11 negative nodes.  Treated with AC x 4 cycles for ER negative, PR positive at only 20%.    . Breast cancer, right breast 12/16/2013   Past Surgical History:  Past Surgical History  Procedure Laterality Date  . Mastectomy, radical      right  . Breast lumpectomy      left  . Port-a-cath removal    . Colonoscopy  07/20/2005    RMR: Diminutive ascending colon polyp, status post resection . Inflamed focally adenomatous polyp  . Colonoscopy  N/A 10/05/2013    RMR: Multiple rectal and colonic polyps-removed/treated. 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 other polyps tubular adenomas  . Flexible sigmoidoscopy N/A 10/11/2013    RMR: rectal polypectomy site identified, assitional resection and tatooing performed. 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 Flexible sigmoidoscopy N/A 11/24/2013    Procedure: FLEXIBLE SIGMOIDOSCOPY;  Surgeon: Daneil Dolin, MD;  Location: [facility];  Service: Endoscopy;  Laterality: N/A;  1:00-moved to [place] notified pt   Social History:  reports that she has been smoking Cigarettes.  She has a 9 pack-year smoking history. She has never used smokeless tobacco. She reports that she does not drink alcohol or use illicit drugs.  Family / Support Systems Marital Status: Separated Patient Roles: Parent, Caregiver Children: Charles " Nate" Javier Glazier  [phone]   Other Supports: Lemar Livings  [phone] Anticipated Caregiver: Son's  Ability/Limitations of Caregiver: Two son's work the [employer] is retired Water quality scientist: Other (Comment) (Still coming up with a plan) Family Dynamics: Close knit family pt has three son's who are close by and supportive.  They are currently taking care of her Aunt she was caring for prior to admission.  Pt's aunt has demntia and requires 24 hr supervision. Pt reports they relied upon her and now are having to assist while she is here.  Social History Preferred language: English Religion: Baptist Cultural Background: No issues Education: High School-CNA classes Read: Yes Write: Yes Employment  Status: Retired Date Retired/Disabled/Unemployed: 2007 Freight forwarder Issues: No issues Guardian/Conservator: None-according to MD pt is capable of making her own decisions while here.   Abuse/Neglect Physical Abuse: Denies Verbal Abuse: Denies Sexual Abuse: Denies Exploitation of patient/patient's resources:  Denies Self-Neglect: Denies  Emotional Status Pt's affect, behavior adn adjustment status: Pt is motivated to regain her loss of function and has always been the one to provide care of others and son's look toward her to make decisions.  She reports: " This is a change for them with me here."  She feels her swallow is her biggest issue righ now. Recent Psychosocial Issues: Other medical issues-24 hr caregiver for her Aunt Pyschiatric History: No history she feels she is doing ok with all of this and does not feel depression screen is needed.  Will monitor throughout stay and intervene if necessary.  Pt has a strong faith and this helps pull her through difficult times. Substance Abuse History: No issues  Patient / Family Perceptions, Expectations & Goals Pt/Family understanding of illness & functional limitations: Pt and son are able to explain her stroke and deficits.  Both are hopeful she will do well here and progress to the point she can take care of herself.  Pt feels her first day is going well and is looking forward to more good days. Premorbid pt/family roles/activities: Mother, Niece, retiree, church member, Health visitor, etc Anticipated changes in roles/activities/participation: resume Pt/family expectations/goals: Pt states: " I want to be able to take care of myself before I leave here."  Son states: " We need to come up with a plan for both of them."  [employer]: None Premorbid Home Care/DME Agencies: Other (Comment) (In the past) Transportation available at discharge: Family Resource referrals recommended: Support group (specify)  Discharge Planning Living Arrangements: Alone Support Systems: Children, Other relatives, Friends/neighbors, Social worker community Type of Residence: Private residence Insurance Resources: Multimedia programmer (specify) ([place] Medicare) Financial Resources: [place] Referred: No Living Expenses:  Own Money Management: Patient Does the patient have any problems obtaining your medications?: No Home Management: Patient Patient/Family Preliminary Plans: Return home was staying with Aunt who lives next door, due to needing 24 hr supervision. Son's are trying to figure out who will be providing care for both of them at discharge, want to see how pt progresses here on rehab. Informed both pt and son pt will not be able to provide care to Aunt at discharge and may need care hereself. Social Work Anticipated Follow Up Needs: HH/OP, Support Group  Clinical Impression Pleasant female who is motivated to improve and realizes she may not be able to provide care to [place] upon discharge form rehab.  She relies upon her three son's to provide assist. Son is currently staying with Aunt but missing work also.  Will discuss safe discharge plan for both she and Aunt and await team's evaluations.  Elease Hashimoto 10/02/2014, 1:34 PM

## 2014-10-02 NOTE — Care Management Note (Signed)
[facility] Individual Statement of Services  Patient Name:  Madison Todd  Date:  10/02/2014  Welcome to the [facility].  Our goal is to provide you with an individualized program based on your diagnosis and situation, designed to meet your specific needs.  With this comprehensive rehabilitation program, you will be expected to participate in at least 3 hours of rehabilitation therapies Monday-Friday, with modified therapy programming on the weekends.  Your rehabilitation program will include the following services:  Physical Therapy (PT), Occupational Therapy (OT), Speech Therapy (ST), 24 hour per day rehabilitation nursing, Case Management (Social Worker), Rehabilitation Medicine, Nutrition Services and Pharmacy Services  Weekly team conferences will be held on Wednesday to discuss your progress.  Your Social Worker will talk with you frequently to get your input and to update you on team discussions.  Team conferences with you and your family in attendance may also be held.  Expected length of stay: 14-20 days  Overall anticipated outcome: supervision with cueing  Depending on your progress and recovery, your program may change. Your Social Worker will coordinate services and will keep you informed of any changes. Your Social Worker's name and contact numbers are listed  below.  The following services may also be recommended but are not provided by the [facility] will be made to provide these services after discharge if needed.  Arrangements include referral to agencies that provide these services.  Your insurance has been verified to be:  [name] Your primary doctor is:  Iona Beard  Pertinent information will be shared with your doctor and your insurance company.  Social Worker:  Ovidio Kin, [place] or [phone]  Information discussed with and copy given to patient by: Elease Hashimoto, 10/02/2014, 1:16 PM

## 2014-10-02 NOTE — Progress Notes (Signed)
64 year old right handed female with history of leukocytoclastic vasculitis, diabetes mellitus and peripheral neuropathy, cardiomyopathy, COPD/tobacco abuse, hypertension and bilateral breast cancer with lumpectomy as well as chemoradiation. Presented 09/27/2014 with severe headache, blurred vision, right-sided weakness and difficulty in swallowing. Initial cranial CT scan negative. MRI of the brain with acute right lateral medullary infarct as well as suspected inferior right lateral cerebellar hemisphere involvement. MRA of the head with some artifactual signal loss in the mid V4 segment with a suspected underlying 1 cm 75% stenosis on the right. Echocardiogram with ejection fraction of 16% grade 1 diastolic dysfunction. Carotid Dopplers with no ICA stenosis. Patient did not receive TPA. Subjective/Complaints:   Objective: Vital Signs: Blood pressure 168/88, pulse 73, temperature 98.2 F (36.8 C), temperature source Oral, resp. rate 16, weight 68.448 kg (150 lb 14.4 oz), SpO2 96 %. Dg Swallowing Func-speech Pathology  10/01/2014   Ephraim Hamburger, CCC-SLP     10/01/2014 12:28 PM  Objective Swallowing Evaluation: Modified Barium Swallowing Study   Patient Details  Name: Madison Todd MRN: [id number] Date of Birth: 08/19/1951  Today's Date: 10/01/2014 Time: SLP Start Time (ACUTE ONLY): 0945-SLP Stop Time (ACUTE  ONLY): 1022 SLP Time Calculation (min) (ACUTE ONLY): 37 min  Past Medical History:  Past Medical History  Diagnosis Date  . Leukocytoclastic vasculitis   . Diabetes mellitus   . Breast cancer     left 2007/right 1997  . Left-sided Breast cancer 07/22/2011  . Cardiomyopathy 07/22/2011  . COPD (chronic obstructive pulmonary disease) 07/22/2011  . Hypertension   . Hyperlipidemia   . 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  Stage II (T2 N0).   ER 73%, PR 90%, Her2 positive, Ki-67 19%.  2.1 cm in size.   Agreed to only have radiation, Herceptin,  and Aromasin.  Right  sided breast cancer: Stage II (T2 N0 M0).  2.5 cm in size.   Surgery on 04/28/1996 with axillary dissection on 05/10/1996.  11  negative nodes.  Treated with AC x 4 cycles for ER negative, PR  positive at only 20%.    . Breast cancer, right breast 12/16/2013   Past Surgical History:  Past Surgical History  Procedure Laterality Date  . Mastectomy, radical      right  . Breast lumpectomy      left  . Port-a-cath removal    . Colonoscopy  07/20/2005    RMR: Diminutive ascending colon polyp, status post resection .  Inflamed focally adenomatous polyp  . Colonoscopy N/A 10/05/2013    RMR: Multiple rectal and colonic polyps-removed/treated. rectal  polyp 5cm from anal verge was carcinoid/margins not clear. other  polyps tubular adenomas  . Flexible sigmoidoscopy N/A 10/11/2013    RMR: rectal polypectomy site identified, assitional resection  and tatooing performed. 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 Flexible sigmoidoscopy N/A 11/24/2013    Procedure: FLEXIBLE SIGMOIDOSCOPY;  Surgeon: Daneil Dolin,  MD;  Location: [facility];  Service: Endoscopy;  Laterality:  N/A;  1:00-moved to [place] notified pt   HPI:  HPI: 64 old female who  has a past medical history of  Leukocytoclastic vasculitis; Diabetes mellitus; Breast cancer;  Left-sided Breast cancer (07/22/2011); Cardiomyopathy (07/22/2011);  COPD (chronic obstructive pulmonary disease) (07/22/2011);  Hypertension; Hyperlipidemia; Invasive ductal carcinoma of left  breast (07/22/2011); and Breast cancer, right breast (12/16/2013).  Admitted to ED 1/14 after having R side headache and gait  imbalance.  MRI 1/15 revealed acute R lateral medullary infarct  with suspected inferior R lateral cerebellar involvement. Pt  failed stroke swallow screen; bedside swallow eval ordered.  No Data Recorded  Clinical Impression (EPIC issues): Pt presents with mild oral  phase and mod/severe sensorimotor based pharyngeal phase  dysphagia characterized by delay in  swallow initiation, reduced  tongue base retraction, decreased excursion of hyolaryngeal  complex, with decreased epiglottic deflection resulting in delay  in swallow response- pooling into pyriforms, reduced opening of  UES, and significant residuals post swallow in pyriforms. Pt with  seemingly fairly good sensation around laryngeal vestibule as  evidenced by good airway protection/throat clearing. Pt with  episode of trace aspiration of thins before/during the swallow,  but was able to remove with cough. Pharyngeal residue was  minimized with implementation of head turn to the right for the  primary swallow (much better with puree/mech soft with this  technique). Although pt does have residuals in pyriforms with  liquids (which certainly pose a risk for aspiration) after the  swallow, her cognition and awareness are good so pt is able to  throat clear, cough, and repeat swallow to protect airway. Given  that pt has improved since Friday (absent swallow response) and  cognition is good, will proceed with oral diet of D1/puree and  Honey-thick liquids with head turn to the right and repeat  swallows to facilitate clearance. Pt will need oral suction at  bedside and recommend continued IV fluids until pt able to  maintain adequate hydration needs on dysphagia diet. Pt will need  to be monitored for fatigue and intake. Placement of alternative  means of nutrition will need to be reconsidered if pt unable to  maintain adequate nutrition by mouth. Pt wishes to avoid feeding  tubes if possible. Pt will benefit from skilled dysphagia  intervention in acute rehab setting to maximize recovery.  Prognosis for improvement is good given pt motivation, awareness,  family support, and progress made thus far. Recommend repeat  objective study in 5-7 days or as clinically appropriate.  Assessment / Plan / Recommendation CHL IP CLINICAL IMPRESSIONS 10/01/2014  Dysphagia Diagnosis Mild oral phase dysphagia;Moderate pharyngeal  phase  dysphagia;Severe pharyngeal phase dysphagia;Mild cervical  esophageal phase dysphagia  Clinical impression Pt presents with mild oral phase and  mod/severe sensorimotor based pharyngeal phase dysphagia  characterized by delay in swallow initiation, reduced tongue base  retraction, decreased excursion of hyolaryngeal complex, with  decreased epiglottic deflection resulting in delay in swallow  response- pooling into pyriforms, reduced opening of UES, and  significant residuals post swallow in pyriforms. Pt with  seemingly fairly good sensation around laryngeal vestibule as  evidenced by good airway protection/throat clearing. Pt with  episode of trace aspiration of thins before/during the swallow,  but was able to remove with cough. Pharyngeal residue was  minimized with implementation of head turn to the right for the  primary swallow (much better with puree/mech soft with this  technique). Although pt does have residuals in pyriforms with  liquids (which certainly pose a risk for aspiration) after the  swallow, her cognition and awareness are good so pt is able to  throat clear, cough, and repeat swallow to protect airway. Given  that pt has improved since Friday (absent swallow response) and  cognition is good, will proceed with oral diet of D1/puree and  Honey-thick liquids with head turn to the right and repeat  swallows to facilitate clearance. Pt will need oral suction at  bedside  and recommend continued IV fluids until pt able to  maintain adequate hydration needs on dysphagia diet. Pt will need  to be monitored for fatigue and intake. Placement of alternative  means of nutrition will need to be reconsidered if pt unable to  maintain adequate nutrition by mouth. Pt wishes to avoid feeding  tubes if possible. Pt will benefit from skilled dysphagia  intervention in acute rehab setting to maximize recovery.  Prognosis for improvement is good given pt motivation, awareness,  family support, and progress made thus  far. Recommend repeat  objective study in 5-7 days or as clinically appropriate.      CHL IP TREATMENT RECOMMENDATION 10/01/2014  Treatment Plan Recommendations Defer treatment plan to SLP at  (Comment)     CHL IP DIET RECOMMENDATION 10/01/2014  Diet Recommendations Dysphagia 1 (Puree);Honey-thick liquid  Liquid Administration via Cup  Medication Administration Crushed with puree  Compensations Slow rate;Multiple dry swallows after each  bite/sip;Effortful swallow  Postural Changes and/or Swallow Maneuvers Out of bed for  meals;Seated upright 90 degrees;Upright 30-60 min after meal;Head  turn right during swallow     CHL IP OTHER RECOMMENDATIONS 10/01/2014  Recommended Consults (None)  Oral Care Recommendations Oral care BID;Patient independent with  oral care  Other Recommendations Have oral suction available     CHL IP FOLLOW UP RECOMMENDATIONS 10/01/2014  Follow up Recommendations Inpatient Rehab     CHL IP FREQUENCY AND DURATION 09/28/2014  Speech Therapy Frequency (ACUTE ONLY) min 3x week  Treatment Duration 2 weeks     Pertinent Vitals/Pain VSS    SLP Swallow Goals No flowsheet data found.  No flowsheet data found.    CHL IP REASON FOR REFERRAL 10/01/2014  Reason for Referral Objectively evaluate swallowing function     CHL IP ORAL PHASE 10/01/2014  Lips (None)  Tongue (None)  Mucous membranes (None)  Nutritional status (None)  Other (None)  Oxygen therapy (None)  Oral Phase WFL  Oral - Pudding Teaspoon (None)  Oral - Pudding Cup (None)  Oral - Honey Teaspoon (None)  Oral - Honey Cup (None)  Oral - Honey Syringe (None)  Oral - Nectar Teaspoon (None)  Oral - Nectar Cup (None)  Oral - Nectar Straw (None)  Oral - Nectar Syringe (None)  Oral - Ice Chips (None)  Oral - Thin Teaspoon (None)  Oral - Thin Cup (None)  Oral - Thin Straw (None)  Oral - Thin Syringe (None)  Oral - Puree (None)  Oral - Mechanical Soft (None)  Oral - Regular (None)  Oral - Multi-consistency (None)  Oral - Pill (None)  Oral Phase - Comment (None)       CHL IP PHARYNGEAL PHASE 10/01/2014  Pharyngeal Phase Impaired  Pharyngeal - Pudding Teaspoon (None)  Penetration/Aspiration details (pudding teaspoon) (None)  Pharyngeal - Pudding Cup (None)  Penetration/Aspiration details (pudding cup) (None)  Pharyngeal - Honey Teaspoon (None)  Penetration/Aspiration details (honey teaspoon) (None)  Pharyngeal - Honey Cup Delayed swallow initiation;Premature  spillage to pyriform sinuses;Reduced pharyngeal  peristalsis;Reduced epiglottic inversion;Reduced anterior  laryngeal mobility;Reduced laryngeal elevation;Pharyngeal residue  - pyriform sinuses;Lateral channel residue;Premature spillage to  valleculae  Penetration/Aspiration details (honey cup) (None)  Pharyngeal - Honey Syringe (None)  Penetration/Aspiration details (honey syringe) (None)  Pharyngeal - Nectar Teaspoon (None)  Penetration/Aspiration details (nectar teaspoon) (None)  Pharyngeal - Nectar Cup Delayed swallow initiation;Premature  spillage to pyriform sinuses;Premature spillage to  valleculae;Reduced epiglottic inversion;Reduced pharyngeal  peristalsis;Reduced anterior laryngeal mobility;Reduced laryngeal  elevation;Reduced airway/laryngeal closure;Penetration/Aspiration  during swallow;Pharyngeal residue -  pyriform sinuses;Lateral  channel residue  Penetration/Aspiration details (nectar cup) Material enters  airway, remains ABOVE vocal cords then ejected out  Pharyngeal - Nectar Straw (None)  Penetration/Aspiration details (nectar straw) (None)  Pharyngeal - Nectar Syringe (None)  Penetration/Aspiration details (nectar syringe) (None)  Pharyngeal - Ice Chips (None)  Penetration/Aspiration details (ice chips) (None)  Pharyngeal - Thin Teaspoon (None)  Penetration/Aspiration details (thin teaspoon) (None)  Pharyngeal - Thin Cup Delayed swallow initiation;Premature  spillage to valleculae;Premature spillage to pyriform  sinuses;Reduced pharyngeal peristalsis;Reduced epiglottic  inversion;Reduced anterior  laryngeal mobility;Reduced laryngeal  elevation;Reduced airway/laryngeal closure;Penetration/Aspiration  during swallow;Trace aspiration;Penetration/Aspiration before  swallow;Pharyngeal residue - pyriform sinuses;Pharyngeal residue  - valleculae;Lateral channel residue  Penetration/Aspiration details (thin cup) Material enters airway,  remains ABOVE vocal cords then ejected out;Material enters  airway, passes BELOW cords then ejected out  Pharyngeal - Thin Straw (None)  Penetration/Aspiration details (thin straw) (None)  Pharyngeal - Thin Syringe (None)  Penetration/Aspiration details (thin syringe') (None)  Pharyngeal - Puree Delayed swallow initiation;Premature spillage  to valleculae;Reduced pharyngeal peristalsis;Reduced epiglottic  inversion;Reduced anterior laryngeal mobility;Reduced laryngeal  elevation;Reduced tongue base retraction;Pharyngeal residue -  valleculae;Pharyngeal residue - pyriform sinuses  Penetration/Aspiration details (puree) (None)  Pharyngeal - Mechanical Soft Premature spillage to  valleculae;Delayed swallow initiation;Reduced epiglottic  inversion;Reduced anterior laryngeal mobility;Reduced laryngeal  elevation;Reduced tongue base retraction;Pharyngeal residue -  valleculae;Pharyngeal residue - pyriform sinuses  Penetration/Aspiration details (mechanical soft) (None)  Pharyngeal - Regular (None)  Penetration/Aspiration details (regular) (None)  Pharyngeal - Multi-consistency (None)  Penetration/Aspiration details (multi-consistency) (None)  Pharyngeal - Pill (None)  Penetration/Aspiration details (pill) (None)  Pharyngeal Comment Head turn to right was effective in reducing  pharyngeal residuals on primary swallow     CHL IP CERVICAL ESOPHAGEAL PHASE 10/01/2014  Cervical Esophageal Phase Impaired  Pudding Teaspoon (None)  Pudding Cup (None)  Honey Teaspoon (None)  Honey Cup (None)  Honey Syringe (None)  Nectar Teaspoon (None)  Nectar Cup Reduced cricopharyngeal relaxation  Nectar Straw  (None)  Nectar Syringe (None)  Thin Teaspoon (None)  Thin Cup (None)  Thin Straw (None)  Thin Syringe (None)  Cervical Esophageal Comment evidence of osteophytes, however more  likely negatively impacted by poor hyolaryngeal excursion    No flowsheet data found.        Thank you,  Genene Churn, [place]  Jeff 10/01/2014, 12:26 PM    Results for orders placed or performed during the hospital encounter of 10/01/14 (from the past 72 hour(s))  Glucose, capillary     Status: Abnormal   Collection Time: 10/01/14  5:05 PM  Result Value Ref Range   Glucose-Capillary 112 (H) 70 - 99 mg/dL  CBC     Status: Abnormal   Collection Time: 10/01/14  5:35 PM  Result Value Ref Range   WBC 12.4 (H) 4.0 - 10.5 K/uL   RBC 5.00 3.87 - 5.11 MIL/uL   Hemoglobin 14.8 12.0 - 15.0 g/dL   HCT 43.7 36.0 - 46.0 %   MCV 87.4 78.0 - 100.0 fL   MCH 29.6 26.0 - 34.0 pg   MCHC 33.9 30.0 - 36.0 g/dL   RDW 14.3 11.5 - 15.5 %   Platelets 289 150 - 400 K/uL  Glucose, capillary     Status: Abnormal   Collection Time: 10/01/14 11:31 PM  Result Value Ref Range   Glucose-Capillary 152 (H) 70 - 99 mg/dL  CBC WITH DIFFERENTIAL     Status: Abnormal   Collection Time: 10/02/14  5:45 AM  Result Value Ref Range  WBC 10.6 (H) 4.0 - 10.5 K/uL   RBC 5.04 3.87 - 5.11 MIL/uL   Hemoglobin 14.5 12.0 - 15.0 g/dL   HCT 44.4 36.0 - 46.0 %   MCV 88.1 78.0 - 100.0 fL   MCH 28.8 26.0 - 34.0 pg   MCHC 32.7 30.0 - 36.0 g/dL   RDW 14.2 11.5 - 15.5 %   Platelets 272 150 - 400 K/uL   Neutrophils Relative % 70 43 - 77 %   Neutro Abs 7.4 1.7 - 7.7 K/uL   Lymphocytes Relative 22 12 - 46 %   Lymphs Abs 2.4 0.7 - 4.0 K/uL   Monocytes Relative 7 3 - 12 %   Monocytes Absolute 0.8 0.1 - 1.0 K/uL   Eosinophils Relative 1 0 - 5 %   Eosinophils Absolute 0.1 0.0 - 0.7 K/uL   Basophils Relative 0 0 - 1 %   Basophils Absolute 0.0 0.0 - 0.1 K/uL     HEENT: normal Cardio: RRR and no murmur Resp: CTA B/L and difficulty clearing  secretions GI: BS positive and NT, ND Extremity:  No Edema Skin:   Intact Neuro: Alert/Oriented, Cranial Nerve Abnormalities R V1,2,3 sensory, Abnormal Sensory reduced in LUE and LLE, Abnormal FMC Ataxic/ dec FMC and Other Right lateral pulsion during sitting Musc/Skel:  Normal Gen NAD, voice quality is hoarse   Assessment/Plan: 1. 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 Physiatrist is providing close team supervision and 24 hour management of active medical problems listed below. Physiatrist and rehab team continue to assess barriers to discharge/monitor patient progress toward functional and medical goals. FIM:                                  Medical Problem List and Plan: 1. 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 DVT Prophylaxis/Anticoagulation: SQ Lovenox .Monitor platelet and any signs of bleeding 3. Pain Management: Tylenol 4. Dysphagia. Pured honey thick liquids. Follow-up speech therapy. IV fluids for hydration 5. Neuropsych: This patient is capable of making decisions on her own behalf. 6. Skin/Wound Care: Routine skin checks 7. Fluids/Electrolytes/Nutrition: Strict I & O.Follow up labs 8.Hypertension. No present antihypertensive medication. Patient on Toprol 50 mg daily prior to admission as well as Tribenzor 40-5-12.5 mg daily.resume as tolerated.Monitor with increased mobility 9.diabetes mellitus with peripheral neuropathy. Patient with hemoglobin A1c 8.9. Sliding scale insulin. Check blood sugars before meals and at bedtime. Patient on Glucophage 500 mg daily prior to admission and resume as tolerated 10.Hyperlipidemia.lipitor 11.COPD/tobacco abuse.Counseling 12.HX Bilat. breast cancer.Follow up oncology out patient  LOS (Days) 1 A FACE TO Harlan E 10/02/2014, 6:23 AM

## 2014-10-02 NOTE — Evaluation (Signed)
Speech Language Pathology Assessment and Plan  Patient Details  Name: Madison Todd MRN: [id number] Date of Birth: [birth year]  SLP Diagnosis: Dysphagia  Rehab Potential: Good ELOS: 14-21 days     Today's Date: 10/02/2014 SLP Individual Time: 1005-1105 SLP Individual Time Calculation (min): 60 min   Problem List:  Patient Active Problem List   Diagnosis Date Noted  . 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 Lacunar infarction 10/01/2014  . 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 CVA (cerebral infarction) 09/28/2014  . TIA (transient ischemic attack) 09/27/2014  . HTN (hypertension) 09/27/2014  . Breast cancer, right breast 12/16/2013  . 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 Adenomatous polyp 10/02/2013  . 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 Cardiomyopathy 07/22/2011  . COPD (chronic obstructive pulmonary disease) 07/22/2011   Past Medical History:  Past Medical History  Diagnosis Date  . Leukocytoclastic vasculitis   . Diabetes mellitus   . Breast cancer     left 2007/right 1997  . Left-sided Breast cancer 07/22/2011  . Cardiomyopathy 07/22/2011  . COPD (chronic obstructive pulmonary disease) 07/22/2011  . Hypertension   . Hyperlipidemia   . 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  Stage II (T2 N0).  ER 73%, PR 90%, Her2 positive, Ki-67 19%.  2.1 cm in size.  Agreed to only have radiation, Herceptin, and Aromasin.  Right sided breast cancer: Stage II (T2 N0 M0).  2.5 cm in size.  Surgery on 04/28/1996 with axillary dissection on 05/10/1996.  11 negative nodes.  Treated with AC x 4 cycles for ER negative, PR positive at only 20%.    . Breast cancer, right breast 12/16/2013   Past Surgical History:  Past Surgical History  Procedure Laterality Date  . Mastectomy, radical      right  . Breast lumpectomy      left  . Port-a-cath removal    .  Colonoscopy  07/20/2005    RMR: Diminutive ascending colon polyp, status post resection . Inflamed focally adenomatous polyp  . Colonoscopy N/A 10/05/2013    RMR: Multiple rectal and colonic polyps-removed/treated. 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 other polyps tubular adenomas  . Flexible sigmoidoscopy N/A 10/11/2013    RMR: rectal polypectomy site identified, assitional resection and tatooing performed. 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 Flexible sigmoidoscopy N/A 11/24/2013    Procedure: FLEXIBLE SIGMOIDOSCOPY;  Surgeon: Daneil Dolin, MD;  Location: [facility];  Service: Endoscopy;  Laterality: N/A;  1:00-moved to [place] notified pt    Assessment / Plan / Recommendation Clinical Impression   64 year old right handed female with history of leukocytoclastic vasculitis, diabetes mellitus and peripheral neuropathy, cardiomyopathy, COPD/tobacco abuse, hypertension and bilateral breast cancer with lumpectomy as well as chemoradiation. Presented 09/27/2014 with severe headache, blurred vision, right-sided weakness and difficulty in swallowing. MRI of the brain with acute right lateral medullary infarct as well as suspected inferior right lateral cerebellar hemisphere involvement. Modified barium swallow 10/01/2014 and recommendations of pured solids with honey thick liquids turning head to the right and supervision with meals. Patient was admitted for a comprehensive rehab program 10/01/2014.  SLP evaluation completed on 10/02/2014 with the following results:  Pt presents with s/s of a moderate oropharyngeal dysphagia (pharyngeal phase impairments > oral phase impairments) characterized by suspected delayed swallow initiation with reports of increased pharyngeal residue with nectar thick  and thin liquids in comparison to honey thick, puree, and solid boluses.  Pt with immediate cough following cup sips of thin liquids on >50% of trials and frequent throat  clearing with nectar thick liquids and required frequent oral suctioning for management of thin, clear secretions.  No overt s/s of aspiration with honey thick liquids, pureed, or solid consistencies.  Pt recalled and utilized compensatory strategies for swallowing safety during the abovementioned trials with supervision cues. Recommend that pt continue on dys 1 solids with honey thick liquids; full supervision for use of swallowing precautions.   Furthermore, pt presented with grossly intact cognitive-linguistic function for basic tasks and information with good awareness into her current deficits and their impact on her functional independence at home.  Pt may benefit from ongoing diagnostic treatment of higher level cognitive tasks as she was independent for medication and financial management prior to admission and was caring for her elderly aunt.   Pt would benefit from skilled ST while inpatient in order to maximize functional independence and reduce burden of care prior to discharge.  Anticipate that pt will need 24/7 supervision, assistance for medication and financial management (due to visuoperceptual issues), and potential ST follow up at discharge.      Skilled Therapeutic Interventions          Cognitive-linguistic evaluation completed with results and recommendations reviewed with family.     SLP Assessment  Patient will need skilled Speech Lanaguage Pathology Services during CIR admission    Recommendations  Diet Recommendations: Dysphagia 1 (Puree);Honey-thick liquid Liquid Administration via: Cup Medication Administration: Crushed with puree Supervision: Patient able to self feed;Full supervision/cueing for compensatory strategies Compensations: Slow rate;Multiple dry swallows after each bite/sip;Effortful swallow Postural Changes and/or Swallow Maneuvers: Out of bed for meals;Seated upright 90 degrees;Upright 30-60 min after meal;Head turn right during swallow Oral Care  Recommendations: Oral care BID Patient destination:  (TBD) Follow up Recommendations: Home Health SLP;24 hour supervision/assistance;Outpatient SLP    SLP Frequency 5 out of 7 days   SLP Treatment/Interventions Dysphagia/aspiration precaution training;Functional tasks;Patient/family education;Oral motor exercises;Internal/external aids;Environmental controls    Pain Pain Assessment Pain Assessment: No/denies pain Pain Score: 0-No pain Prior Functioning Cognitive/Linguistic Baseline: Within functional limits Type of Home: House  Lives With: Family (Pt takes care of her elderly aunt with Alzheimer's) Available Help at Discharge: Family;Available 24 hours/day (per pt report, her sons may be available to provide 24/7 supervision ) Education: high school, 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  SLP Short Term Goal 2 (Week 1): Pt will tolerate trials of advanced consistencies with no overt s/s of aspiraiton and supervision cues for use of swallowing precautions.  SLP Short Term Goal 3 (Week 1): Pt will return demonstration of oral motor strengthening exercises targeting improved right sided strength, range of motion, and coordination for mastication of solid textures with supervision cues.  SLP Short Term Goal 4 (Week 1): Pt will return demonstration of pharyngeal strengthening exercises to improve swallowing function and to continue working towards diet advancement with supervision cues   See FIM for current functional status Refer to Care Plan for Long Term Goals  Recommendations for other services: None  Discharge Criteria: Patient will be discharged from SLP if patient refuses treatment 3 consecutive times without medical reason, if treatment goals not met,  if there is a  change in medical status, if patient makes no progress towards goals or if patient is discharged from hospital.  The above assessment, treatment plan, treatment alternatives and goals were discussed and mutually agreed upon: by patient  Emilio Math 10/02/2014, 11:28 AM

## 2014-10-02 NOTE — Evaluation (Signed)
Occupational Therapy Assessment and Plan  Patient Details  Name: Madison Todd MRN: [id number] Date of Birth: [birth year]  OT Diagnosis: abnormal posture, ataxia, hemiplegia affecting dominant side and muscle weakness (generalized) Rehab Potential: Rehab Potential (ACUTE ONLY): Good ELOS: 14-18 days   Today's Date: 10/02/2014 OT Individual Time: 1300-1400 OT Individual Time Calculation (min): 60 min     Problem List:  Patient Active Problem List   Diagnosis Date Noted  . 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 Lacunar infarction 10/01/2014  . 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 CVA (cerebral infarction) 09/28/2014  . TIA (transient ischemic attack) 09/27/2014  . HTN (hypertension) 09/27/2014  . Breast cancer, right breast 12/16/2013  . 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 Adenomatous polyp 10/02/2013  . 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 Cardiomyopathy 07/22/2011  . COPD (chronic obstructive pulmonary disease) 07/22/2011    Past Medical History:  Past Medical History  Diagnosis Date  . Leukocytoclastic vasculitis   . Diabetes mellitus   . Breast cancer     left 2007/right 1997  . Left-sided Breast cancer 07/22/2011  . Cardiomyopathy 07/22/2011  . COPD (chronic obstructive pulmonary disease) 07/22/2011  . Hypertension   . Hyperlipidemia   . 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  Stage II (T2 N0).  ER 73%, PR 90%, Her2 positive, Ki-67 19%.  2.1 cm in size.  Agreed to only have radiation, Herceptin, and Aromasin.  Right sided breast cancer: Stage II (T2 N0 M0).  2.5 cm in size.  Surgery on 04/28/1996 with axillary dissection on 05/10/1996.  11 negative nodes.  Treated with AC x 4 cycles for ER negative, PR positive at only 20%.    . Breast cancer, right breast 12/16/2013   Past Surgical History:  Past Surgical History  Procedure Laterality Date   . Mastectomy, radical      right  . Breast lumpectomy      left  . Port-a-cath removal    . Colonoscopy  07/20/2005    RMR: Diminutive ascending colon polyp, status post resection . Inflamed focally adenomatous polyp  . Colonoscopy N/A 10/05/2013    RMR: Multiple rectal and colonic polyps-removed/treated. 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 other polyps tubular adenomas  . Flexible sigmoidoscopy N/A 10/11/2013    RMR: rectal polypectomy site identified, assitional resection and tatooing performed. 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 Flexible sigmoidoscopy N/A 11/24/2013    Procedure: FLEXIBLE SIGMOIDOSCOPY;  Surgeon: Daneil Dolin, MD;  Location: [facility];  Service: Endoscopy;  Laterality: N/A;  1:00-moved to [place] notified pt    Assessment & Plan Clinical Impression: Patient is a 64 y.o. right handed female with history of leukocytoclastic vasculitis, diabetes mellitus and peripheral neuropathy, cardiomyopathy, COPD/tobacco abuse, hypertension and bilateral breast cancer with lumpectomy as well as chemoradiation. Presented 09/27/2014 with severe headache, blurred vision, right-sided weakness and difficulty in swallowing. Initial cranial CT scan negative. MRI of the brain with acute right lateral medullary infarct as well as suspected inferior right lateral cerebellar hemisphere involvement. MRA of the head with some artifactual signal loss in the mid V4 segment with a suspected underlying 1 cm 75% stenosis on the right. Echocardiogram with ejection fraction of 53% grade 1 diastolic dysfunction. Carotid Dopplers with no ICA stenosis. Patient did not receive TPA. Neurology consulted(Dr.[place]) maintain on aspirin for CVA prophylaxis as  well as subcutaneous Lovenox for DVT prophylaxis. Noted dysphagia and has been nothing by mouth with modified barium swallow 10/01/2014 and recommendations of pured with honey thick liquids turning head to the right  and supervision with meals. IV fluids for hydration. Bouts of hypokalemia resolved with potassium supplement. Hemoglobin A1c of 8.9 with insulin therapy as directed. Physical and occupational therapy evaluations completed.   Patient transferred to CIR on 10/01/2014 .    Patient currently requires mod with basic self-care skills secondary to muscle weakness, impaired timing and sequencing, unbalanced muscle activation, ataxia and decreased coordination, diplopia and decreased standing balance, hemiplegia and decreased balance strategies.  Prior to hospitalization, patient could complete ADLs with independent .  Patient will benefit from skilled intervention to increase independence with basic self-care skills and increase level of independence with iADL prior to discharge home with care partner.  Anticipate patient will require 24 hour supervision and follow up home health.  OT - End of Session Activity Tolerance: Tolerates 30+ min activity with multiple rests Endurance Deficit: Yes Endurance Deficit Description: Pt needed seated rest following 1-2 min standing tolerance OT Assessment Rehab Potential (ACUTE ONLY): Good Barriers to Discharge: Decreased caregiver support Barriers to Discharge Comments: pt reports 2 sons working on arranging 24/7 supervision/assist OT Patient demonstrates impairments in the following area(s): Balance;Endurance;Motor;Vision OT Basic ADL's Functional Problem(s): Eating;Grooming;Bathing;Dressing;Toileting OT Advanced ADL's Functional Problem(s): Simple Meal Preparation;Laundry OT Transfers Functional Problem(s): Toilet;Tub/Shower OT Additional Impairment(s): Fuctional Use of Upper Extremity OT Plan OT Intensity: Minimum of 1-2 x/day, 45 to 90 minutes OT Frequency: 5 out of 7 days OT Duration/Estimated Length of Stay: 14-18 days OT Treatment/Interventions: Balance/vestibular training;Discharge planning;Disease Garment/textile technologist;Functional mobility training;Neuromuscular re-education;Pain management;Patient/family education;Psychosocial support;Self Care/advanced ADL retraining;Therapeutic Activities;Therapeutic Exercise;UE/LE Strength taining/ROM;UE/LE Coordination activities;Visual/perceptual remediation/compensation OT Self Feeding Anticipated Outcome(s): Supervision OT Basic Self-Care Anticipated Outcome(s): Supervision OT Toileting Anticipated Outcome(s): Supervision OT Bathroom Transfers Anticipated Outcome(s): Supervision OT Recommendation Patient destination: Home Follow Up Recommendations: Home health OT;24 hour supervision/assistance Equipment Recommended: 3 in 1 bedside comode;Tub/shower bench   Skilled Therapeutic Intervention OT eval with focus on rehab process, OT purpose, goals and ELOS.  Engaged in ADL assessment at sit > stand level at sink with mod assist for standing balance due to Rt lateral lean and pushing in standing. Noted RUE ataxia when incorporating RUE into self-care tasks with overshooting.  Mod assist stand and squat pivot transfers, more controlled with squat pivot however tends to move quickly during transfers.  Educated on incorporating RUE into all tasks to promote motor control and visually attend to RUE to decrease ataxia.  Returned to bed at end of session, mod assist squat pivot.  OT Evaluation Precautions/Restrictions  Precautions Precautions: Fall Precaution Comments: Rt lateral lean, double vision Restrictions Weight Bearing Restrictions: No Pain Pain Assessment Pain Assessment: No/denies pain Pain Score: 0-No pain Home Living/Prior Functioning Home Living Family/patient expects to be discharged to:: Private residence Living Arrangements: Alone Available Help at Discharge: Family, Available 24 hours/day (per pt report, 2 sons are working out 24/7 care) Type of Home: House Home Access: Stairs to enter Technical brewer of Steps: 1 in back, 3 in  front Entrance Stairs-Rails: Can reach both (on front stairs) Home Layout: One level Additional Comments: Patient does not appear to use any DME at home, except for mother's old equipment, reports having 2 shower chairs but was not using them PTA  Lives With: Family (Pt takes care of her elderly aunt who has dementia) IADL History Homemaking Responsibilities: Yes Meal  Prep Responsibility: Primary Laundry Responsibility: Primary Cleaning Responsibility: Primary Education: high school, training certificate for CNA Prior Function Level of Independence: Independent with basic ADLs, Independent with gait, Independent with transfers  Able to Take Stairs?: Yes Driving: Yes Vocation: Retired ADL  See FIM Vision/Perception  Vision- History Patient Visual Report: Blurring of vision;Diplopia Vision- Assessment Vision Assessment?: Yes Eye Alignment: Within Functional Limits Alignment/Gaze Preference: Within Defined Limits Tracking/Visual Pursuits: Able to track stimulus in all quads without difficulty Saccades: Additional eye shifts occurred during testing Depth Perception: Overshoots  Cognition Overall Cognitive Status: Within Functional Limits for tasks assessed (for basic tasks, informally ) Arousal/Alertness: Awake/alert Orientation Level: Oriented to person;Oriented to place;Oriented to time;Oriented to situation Attention: Selective Selective Attention: Appears intact Memory: Appears intact Safety/Judgment: Appears intact Sensation Sensation Light Touch: Appears Intact (pt reports tingling throughout) Stereognosis: Not tested Hot/Cold: Not tested Proprioception: Appears Intact Coordination Gross Motor Movements are Fluid and Coordinated: No Fine Motor Movements are Fluid and Coordinated: No Coordination and Movement Description: Pt with impaired timing, accuracy, and excursion of trunk and LE movements. Strong R lean in static and dynamic situations Finger Nose Finger Test:  Rt: 5x in 10 seconds with ataxic movements, Lt: 10x in 10 seconds 9 Hole Peg Test: Rt: 40 sec with overshooting and mild ataxia, Lt: 34 seconds  Trunk/Postural Assessment  Cervical Assessment Cervical Assessment: Within Functional Limits Thoracic Assessment Thoracic Assessment: Within Functional Limits Lumbar Assessment Lumbar Assessment: Within Functional Limits Postural Control Postural Control: Deficits on evaluation Trunk Control: R pushing in static and dynamic conditions Righting Reactions: Delayed and insuffiecient Protective Responses: heavy UE reliance  Balance   Extremity/Trunk Assessment RUE Assessment RUE Assessment: Exceptions to WFL (ROM WFL, shoulder grossly 3+/5, 4/5 overall) LUE Assessment LUE Assessment: Within Functional Limits  FIM:  FIM - Eating Eating Activity: 5: Needs verbal cues/supervision FIM - Grooming Grooming Steps: Wash, rinse, dry face;Wash, rinse, dry hands Grooming: 2: Patient completes 1 of 4 or 2 of 5 steps FIM - Bathing Bathing Steps Patient Completed: Chest;Right Arm;Left Arm;Abdomen;Right upper leg;Left upper leg Bathing: 3: Mod-Patient completes 5-7 [id number] parts or 50-74% FIM - Upper Body Dressing/Undressing Upper body dressing/undressing steps patient completed: Thread/unthread right sleeve of front closure shirt/dress;Button/unbutton shirt Upper body dressing/undressing: 3: Mod-Patient completed 50-74% of tasks FIM - Lower Body Dressing/Undressing Lower body dressing/undressing steps patient completed: Thread/unthread right pants leg;Thread/unthread left pants leg;Pull pants up/down;Don/Doff right sock;Don/Doff left sock Lower body dressing/undressing: 3: Mod-Patient completed 50-74% of tasks FIM - Toileting Toileting steps completed by patient: Adjust clothing prior to toileting;Performs perineal hygiene;Adjust clothing after toileting Toileting: 3: Mod-Patient completed 2 of 3 steps FIM - Bed/Chair Transfer Bed/Chair Transfer  Assistive Devices: Arm rests Bed/Chair Transfer: 3: Chair or W/C > Bed: Mod A (lift or lower assist);5: Sit > Supine: Supervision (verbal cues/safety issues) FIM - TRadio producerDevices: Grab bars Toilet Transfers: 3-To toilet/BSC: Mod A (lift or lower assist);3-From toilet/BSC: Mod A (lift or lower assist) FIM - Tub/Shower Transfers Tub/shower Transfers: 0-Activity did not occur or was simulated   Refer to Care Plan for Long Term Goals  Recommendations for other services: None  Discharge Criteria: Patient will be discharged from OT if patient refuses treatment 3 consecutive times without medical reason, if treatment goals not met, if there is a change in medical status, if patient makes no progress towards goals or if patient is discharged from hospital.  The above assessment, treatment plan, treatment alternatives and goals were discussed and mutually agreed upon:  by patient  Simonne Come 10/02/2014, 2:18 PM

## 2014-10-02 NOTE — Evaluation (Signed)
Physical Therapy Assessment and Plan  Patient Details  Name: Madison Todd MRN: [id number] Date of Birth: 05-09-1951  PT Diagnosis: Abnormal posture, Abnormality of gait, Ataxia, Hemiparesis dominant and Impaired sensation Rehab Potential: Good ELOS: 14-16 days   Today's Date: 10/02/2014 PT Individual Time: 0800-0905 PT Individual Time Calculation (min): 65 min    Problem List:  Patient Active Problem List   Diagnosis Date Noted  . 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 Lacunar infarction 10/01/2014  . 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 CVA (cerebral infarction) 09/28/2014  . TIA (transient ischemic attack) 09/27/2014  . HTN (hypertension) 09/27/2014  . Breast cancer, right breast 12/16/2013  . 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 Adenomatous polyp 10/02/2013  . 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 Cardiomyopathy 07/22/2011  . COPD (chronic obstructive pulmonary disease) 07/22/2011    Past Medical History:  Past Medical History  Diagnosis Date  . Leukocytoclastic vasculitis   . Diabetes mellitus   . Breast cancer     left 2007/right 1997  . Left-sided Breast cancer 07/22/2011  . Cardiomyopathy 07/22/2011  . COPD (chronic obstructive pulmonary disease) 07/22/2011  . Hypertension   . Hyperlipidemia   . 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  Stage II (T2 N0).  ER 73%, PR 90%, Her2 positive, Ki-67 19%.  2.1 cm in size.  Agreed to only have radiation, Herceptin, and Aromasin.  Right sided breast cancer: Stage II (T2 N0 M0).  2.5 cm in size.  Surgery on 04/28/1996 with axillary dissection on 05/10/1996.  11 negative nodes.  Treated with AC x 4 cycles for ER negative, PR positive at only 20%.    . Breast cancer, right breast 12/16/2013   Past Surgical History:  Past Surgical History  Procedure Laterality Date  . Mastectomy, radical      right  .  Breast lumpectomy      left  . Port-a-cath removal    . Colonoscopy  07/20/2005    RMR: Diminutive ascending colon polyp, status post resection . Inflamed focally adenomatous polyp  . Colonoscopy N/A 10/05/2013    RMR: Multiple rectal and colonic polyps-removed/treated. 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 other polyps tubular adenomas  . Flexible sigmoidoscopy N/A 10/11/2013    RMR: rectal polypectomy site identified, assitional resection and tatooing performed. 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 Flexible sigmoidoscopy N/A 11/24/2013    Procedure: FLEXIBLE SIGMOIDOSCOPY;  Surgeon: Daneil Dolin, MD;  Location: [facility];  Service: Endoscopy;  Laterality: N/A;  1:00-moved to [place] notified pt    Assessment & Plan Clinical Impression: 64 year old right handed female with history of leukocytoclastic vasculitis, diabetes mellitus and peripheral neuropathy, cardiomyopathy, COPD/tobacco abuse, hypertension and bilateral breast cancer with lumpectomy as well as chemoradiation. Presented 09/27/2014 with severe headache, blurred vision, right-sided weakness and difficulty in swallowing. Initial cranial CT scan negative. MRI of the brain with acute right lateral medullary infarct as well as suspected inferior right lateral cerebellar hemisphere involvement. MRA of the head with some artifactual signal loss in the mid V4 segment with a suspected underlying 1 cm 75% stenosis on the right. Echocardiogram with ejection fraction of 19% grade 1 diastolic dysfunction. Carotid Dopplers with no ICA stenosis. Patient did not receive TPA. Neurology consulted(Dr.[place]) maintain on aspirin for CVA prophylaxis as well as subcutaneous Lovenox for DVT prophylaxis. Noted dysphagia  and has been nothing by mouth with modified barium swallow 10/01/2014 and recommendations of pured with honey thick liquids turning head to the right and supervision with meals. IV fluids for  hydration. Bouts of hypokalemia resolved with potassium supplement. Hemoglobin A1c of 8.9 with insulin therapy as directed. Physical and occupational therapy evaluations completed.Patient was admitted for a comprehensive rehab program.  Patient transferred to CIR on 10/01/2014 .   Patient currently requires max with gait using RW and Mod A for basic transfers secondary to decreased cardiorespiratoy endurance, impaired timing and sequencing and decreased coordination, diplopia, Pusher's Syndrome, decreased postural control and righting reactions and decreased sitting balance, decreased standing balance, hemiplegia and decreased balance strategies.  Prior to hospitalization, patient was independent  with mobility and lived with Family Engineer, petroleum) in a House home.  Home access is 1 in back, 3 in frontStairs to enter.  Patient will benefit from skilled PT intervention to maximize safe functional mobility, minimize fall risk and decrease caregiver burden for planned discharge home with 24 hour supervision.  Anticipate patient will benefit from follow up [place] at discharge.  PT - End of Session Activity Tolerance: Tolerates < 10 min activity, no significant change in vital signs Endurance Deficit: Yes Endurance Deficit Description: Pt needed seated rest following 1-2 min standing tolerance PT Assessment Rehab Potential (ACUTE/IP ONLY): Good Barriers to Discharge Comments: Barriers TBD depending on family assist available PT Patient demonstrates impairments in the following area(s): Balance;Endurance;Motor;Perception;Safety;Sensory PT Transfers Functional Problem(s): Bed Mobility;Bed to Chair;Car;Furniture;Floor PT Locomotion Functional Problem(s): Ambulation;Stairs PT Plan PT Intensity: Minimum of 1-2 x/day ,45 to 90 minutes PT Frequency: 5 out of 7 days PT Duration Estimated Length of Stay: 14-16 days PT Treatment/Interventions: Ambulation/gait training;Balance/vestibular training;Community  reintegration;Discharge planning;DME/adaptive equipment instruction;Disease management/prevention;Functional mobility training;Neuromuscular re-education;Patient/family education;Pain management;Psychosocial support;Stair training;Therapeutic Activities;Therapeutic Exercise;UE/LE Strength taining/ROM;UE/LE Coordination activities;Visual/perceptual remediation/compensation PT Transfers Anticipated Outcome(s): Supervision PT Locomotion Anticipated Outcome(s): Supervision with LRAD PT Recommendation Recommendations for Other Services: Neuropsych consult Follow Up Recommendations: Home health PT;24 hour supervision/assistance Patient destination: Home Equipment Recommended: Rolling walker with 5" wheels;To be determined Equipment Details: May have some DME from mother's care in past  Skilled Therapeutic Intervention Tx initiated upon eval including orientation to PT POC, goal setting, and principles of rehab. Tx focused on NMR via forced use, manual facilitation, and verbal cues during transfers and gait training with RW. Pt's posture and movements marked by significant R pulsion, including trunk lean and R LE adduction in sitting and standing. Pt able to modify posture in sitting with verbal cues, but has difficulty finding midline in standing despite max A, tactile cues and manual facilitation. Pt instructed in RW management with gait x XXX with Max A for trunk control. Pt responds well to multi-modal NMR cues for step width, posture, and step length. Pt required Mod A for trunk control and NMR for weight shifting during transfers. Patch provided due to diplopia, but pt unable to report change in 5/10 symptoms.    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 with ) Type of Home: House Home Access: Stairs to enter [employer] of Steps: 1 in back, 3 in front Entrance Stairs-Rails: Can  reach both (on Back stairs) Home Layout: One level Additional Comments: Patient does not appear to have any DME for use at home, except for mother's old equipment'  Lives With: Family Engineer, petroleum) Prior Function Level of Independence: Independent with basic ADLs;Independent with gait;Independent with transfers  Able to Take Stairs?: Yes Driving: Yes Vocation: Other (comment) (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) Vision/Perception  Vision - History Baseline Vision: Wears glasses only for reading Patient Visual Report: Blurring of vision;Diplopia Vision - Assessment Vision Assessment: Vision impaired - to be further tested in functional context Perception Perception: Within Functional Limits Praxis Praxis: Intact  Cognition Overall Cognitive Status: Within Functional Limits for tasks assessed Arousal/Alertness: Awake/alert Orientation Level: Oriented X4 Sensation Sensation Light Touch: Appears Intact (LT intact but pt reports tingling sensations throughout) Stereognosis: Not tested Hot/Cold: Not tested Proprioception: Appears Intact Coordination Gross Motor Movements are Fluid and Coordinated: No Fine Motor Movements are Fluid and Coordinated: No Coordination and Movement Description: Pt with impaired timing, accuracy, and excursion of trunk and LE movements. Strong R lean in static and dynamic situations Heel Shin Test: Impaired accuracy and excursion Motor  Motor Motor: Hemiplegia;Abnormal postural alignment and control;Motor impersistence Motor - Skilled Clinical Observations: RLE weakness proximal >distal, midline disorientation, R pushing tendencies   Mobility Transfers Transfers: Yes (Mod A with strong R lean/pushing), cues for technique and sequence.  Locomotion  Ambulation Ambulation: Yes Ambulation/Gait Assistance: 1: +1 Total assist Ambulation Distance (Feet): 8 Feet Assistive device: 1 person hand held assist Ambulation/Gait Assistance Details: Tactile cues for weight shifting;Tactile cues for sequencing;Tactile cues for posture;Verbal cues for precautions/safety;Verbal cues for sequencing;Verbal cues for technique;Verbal cues for safe use of DME/AE;Verbal cues for gait pattern;Manual facilitation for weight shifting Ambulation/Gait Assistance Details: Pt with R pushing tendencies, RLE crossing midline, but able to adjust momentarily with cues, short L step length. Decreased hip ext and L knee recurvatum Stairs / Additional Locomotion Stairs: Yes Stairs Assistance: 2: Max Armed forces technical officer Details (indicate cue type and reason): Max A for trunk balance/leaning and cues for sequence and technique Stair Management Technique: Two rails;Step to pattern;Forwards Number of Stairs: 5 Height of Stairs: 6 Wheelchair Mobility Wheelchair Mobility: Yes Wheelchair Assistance: 4: Energy manager: Both upper extremities Wheelchair Parts Management: Needs assistance Distance: 75  Trunk/Postural Assessment  Cervical Assessment Cervical Assessment: Within Scientist, physiological Assessment: Within Functional Limits Lumbar Assessment Lumbar Assessment: Within Functional Limits Postural Control Postural Control: Deficits on evaluation Trunk Control: R pushing in static and dynamic conditions Righting Reactions: Delayed and insuffiecient Protective Responses: heavy UE reliance  Balance Balance Balance Assessed: Yes Static Sitting Balance Static Sitting - Balance Support: Bilateral upper extremity supported;Feet supported Static Sitting - Level of Assistance: 5: Stand by assistance Dynamic  Sitting Balance Dynamic Sitting - Balance Support: Bilateral upper extremity supported;Feet supported Dynamic Sitting - Level of Assistance: 4: Min Insurance risk surveyor Standing - Balance Support: Bilateral upper extremity supported Static Standing - Level of Assistance: 3: Mod assist Static Standing - Comment/# of Minutes: 23mn Dynamic Standing Balance Dynamic Standing - Balance Support: Bilateral upper extremity supported;During functional activity Dynamic Standing - Level of Assistance: 2: Max assist Dynamic Standing - Balance Activities: Lateral lean/weight shifting;Forward lean/weight shifting Extremity Assessment  RUE Assessment RUE Assessment: Within Functional Limits LUE Assessment LUE Assessment: Within Functional Limits RLE Assessment RLE Assessment: Exceptions to [facility] Strength RLE Overall Strength Comments: 3/5 hip felxor, otherwise 3+/5 throughout, ROM WFL LLE Assessment LLE Assessment: Within Functional Limits  FIM:  FIM - Control and instrumentation engineer Devices: Arm rests Bed/Chair Transfer: 3: Bed > Chair or W/C: Mod A (lift or lower assist);3: Chair or W/C > Bed: Mod A (lift or lower assist) FIM - Locomotion: Wheelchair Distance: 75 Locomotion: Wheelchair: 2: Travels 50 - 149 ft with minimal assistance (Pt.>75%) FIM - Locomotion: Ambulation Locomotion: Ambulation Assistive Devices: Other (comment) (HHA) Ambulation/Gait Assistance: 1: +1 Total assist Locomotion: Ambulation: 1: Travels less than 50 ft with total assistance/helper does all (Pt.<25%) FIM - Locomotion: Stairs Locomotion: Scientist, physiological: Hand rail - 2 Locomotion: Stairs: 2: Up and Down 4 - 11 stairs with maximal assistance (Pt: 25 - 49%)   Refer to Care Plan for Long Term Goals  Recommendations for other services: Neuropsych  Discharge Criteria: Patient will be discharged from PT if patient refuses treatment 3 consecutive times without medical reason,  if treatment goals not met, if there is a change in medical status, if patient makes no progress towards goals or if patient is discharged from hospital.  The above assessment, treatment plan, treatment alternatives and goals were discussed and mutually agreed upon: by patient   Kennieth Rad, PT, DPT  10/02/2014, 10:58 AM

## 2014-10-03 ENCOUNTER — Ambulatory Visit (HOSPITAL_COMMUNITY): Payer: Commercial Managed Care - HMO | Admitting: Speech Pathology

## 2014-10-03 ENCOUNTER — Inpatient Hospital Stay (HOSPITAL_COMMUNITY): Payer: Commercial Managed Care - HMO

## 2014-10-03 ENCOUNTER — Inpatient Hospital Stay (HOSPITAL_COMMUNITY): Payer: Commercial Managed Care - HMO | Admitting: Occupational Therapy

## 2014-10-03 LAB — GLUCOSE, CAPILLARY
GLUCOSE-CAPILLARY: 204 mg/dL — AB (ref 70–99)
Glucose-Capillary: 159 mg/dL — ABNORMAL HIGH (ref 70–99)
Glucose-Capillary: 165 mg/dL — ABNORMAL HIGH (ref 70–99)
Glucose-Capillary: 120 mg/dL — ABNORMAL HIGH (ref 70–99)

## 2014-10-03 NOTE — Progress Notes (Signed)
64 year old right handed female with history of leukocytoclastic vasculitis, diabetes mellitus and peripheral neuropathy, cardiomyopathy, COPD/tobacco abuse, hypertension and bilateral breast cancer with lumpectomy as well as chemoradiation. Presented 09/27/2014 with severe headache, blurred vision, right-sided weakness and difficulty in swallowing. Initial cranial CT scan negative. MRI of the brain with acute right lateral medullary infarct as well as suspected inferior right lateral cerebellar hemisphere involvement. MRA of the head with some artifactual signal loss in the mid V4 segment with a suspected underlying 1 cm 75% stenosis on the right. Echocardiogram with ejection fraction of 29% grade 1 diastolic dysfunction. Carotid Dopplers with no ICA stenosis. Patient did not receive TPA. Subjective/Complaints: Still leaning to right Poor fluid intake secondary to honey thickener Discussed no driving at D/C  Review of Systems - Negative except swallowing and balance problems Objective: Vital Signs: Blood pressure 153/81, pulse 64, temperature 98 F (36.7 C), temperature source Oral, resp. rate 18, weight 68.448 kg (150 lb 14.4 oz), SpO2 94 %. Dg Swallowing Func-speech Pathology  10/01/2014   Ephraim Hamburger, CCC-SLP     10/01/2014 12:28 PM  Objective Swallowing Evaluation: Modified Barium Swallowing Study   Patient Details  Name: Madison Todd MRN: [id number] Date of Birth: 03-18-1951  Today's Date: 10/01/2014 Time: SLP Start Time (ACUTE ONLY): 0945-SLP Stop Time (ACUTE  ONLY): 1022 SLP Time Calculation (min) (ACUTE ONLY): 37 min  Past Medical History:  Past Medical History  Diagnosis Date  . Leukocytoclastic vasculitis   . Diabetes mellitus   . Breast cancer     left 2007/right 1997  . Left-sided Breast cancer 07/22/2011  . Cardiomyopathy 07/22/2011  . COPD (chronic obstructive pulmonary disease) 07/22/2011  . Hypertension   . Hyperlipidemia   . 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  Stage II (T2 N0).   ER 73%, PR 90%, Her2 positive, Ki-67 19%.  2.1 cm in size.   Agreed to only have radiation, Herceptin, and Aromasin.  Right  sided breast cancer: Stage II (T2 N0 M0).  2.5 cm in size.   Surgery on 04/28/1996 with axillary dissection on 05/10/1996.  11  negative nodes.  Treated with AC x 4 cycles for ER negative, PR  positive at only 20%.    . Breast cancer, right breast 12/16/2013   Past Surgical History:  Past Surgical History  Procedure Laterality Date  . Mastectomy, radical      right  . Breast lumpectomy      left  . Port-a-cath removal    . Colonoscopy  07/20/2005    RMR: Diminutive ascending colon polyp, status post resection .  Inflamed focally adenomatous polyp  . Colonoscopy N/A 10/05/2013    RMR: Multiple rectal and colonic polyps-removed/treated. rectal  polyp 5cm from anal verge was carcinoid/margins not clear. other  polyps tubular adenomas  . Flexible sigmoidoscopy N/A 10/11/2013    RMR: rectal polypectomy site identified, assitional resection  and tatooing performed. 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 Flexible sigmoidoscopy N/A 11/24/2013    Procedure: FLEXIBLE SIGMOIDOSCOPY;  Surgeon: Daneil Dolin,  MD;  Location: [facility];  Service: Endoscopy;  Laterality:  N/A;  1:00-moved to [place] notified pt   HPI:  HPI: 64 old female who  has a past medical history of  Leukocytoclastic vasculitis; Diabetes mellitus; Breast cancer;  Left-sided Breast cancer (07/22/2011); Cardiomyopathy (07/22/2011);  COPD (chronic obstructive pulmonary disease) (07/22/2011);  Hypertension; Hyperlipidemia; Invasive ductal carcinoma  of left  breast (07/22/2011); and Breast cancer, right breast (12/16/2013).  Admitted to ED 1/14 after having R side headache and gait  imbalance. MRI 1/15 revealed acute R lateral medullary infarct  with suspected inferior R lateral cerebellar involvement. Pt  failed stroke swallow screen; bedside swallow eval ordered.  No Data  Recorded  Clinical Impression (EPIC issues): Pt presents with mild oral  phase and mod/severe sensorimotor based pharyngeal phase  dysphagia characterized by delay in swallow initiation, reduced  tongue base retraction, decreased excursion of hyolaryngeal  complex, with decreased epiglottic deflection resulting in delay  in swallow response- pooling into pyriforms, reduced opening of  UES, and significant residuals post swallow in pyriforms. Pt with  seemingly fairly good sensation around laryngeal vestibule as  evidenced by good airway protection/throat clearing. Pt with  episode of trace aspiration of thins before/during the swallow,  but was able to remove with cough. Pharyngeal residue was  minimized with implementation of head turn to the right for the  primary swallow (much better with puree/mech soft with this  technique). Although pt does have residuals in pyriforms with  liquids (which certainly pose a risk for aspiration) after the  swallow, her cognition and awareness are good so pt is able to  throat clear, cough, and repeat swallow to protect airway. Given  that pt has improved since Friday (absent swallow response) and  cognition is good, will proceed with oral diet of D1/puree and  Honey-thick liquids with head turn to the right and repeat  swallows to facilitate clearance. Pt will need oral suction at  bedside and recommend continued IV fluids until pt able to  maintain adequate hydration needs on dysphagia diet. Pt will need  to be monitored for fatigue and intake. Placement of alternative  means of nutrition will need to be reconsidered if pt unable to  maintain adequate nutrition by mouth. Pt wishes to avoid feeding  tubes if possible. Pt will benefit from skilled dysphagia  intervention in acute rehab setting to maximize recovery.  Prognosis for improvement is good given pt motivation, awareness,  family support, and progress made thus far. Recommend repeat  objective study in 5-7 days or as  clinically appropriate.  Assessment / Plan / Recommendation CHL IP CLINICAL IMPRESSIONS 10/01/2014  Dysphagia Diagnosis Mild oral phase dysphagia;Moderate pharyngeal  phase dysphagia;Severe pharyngeal phase dysphagia;Mild cervical  esophageal phase dysphagia  Clinical impression Pt presents with mild oral phase and  mod/severe sensorimotor based pharyngeal phase dysphagia  characterized by delay in swallow initiation, reduced tongue base  retraction, decreased excursion of hyolaryngeal complex, with  decreased epiglottic deflection resulting in delay in swallow  response- pooling into pyriforms, reduced opening of UES, and  significant residuals post swallow in pyriforms. Pt with  seemingly fairly good sensation around laryngeal vestibule as  evidenced by good airway protection/throat clearing. Pt with  episode of trace aspiration of thins before/during the swallow,  but was able to remove with cough. Pharyngeal residue was  minimized with implementation of head turn to the right for the  primary swallow (much better with puree/mech soft with this  technique). Although pt does have residuals in pyriforms with  liquids (which certainly pose a risk for aspiration) after the  swallow, her cognition and awareness are good so pt is able to  throat clear, cough, and repeat swallow to protect airway. Given  that pt has improved since Friday (absent swallow response) and  cognition is good, will proceed with oral diet of D1/puree  and  Honey-thick liquids with head turn to the right and repeat  swallows to facilitate clearance. Pt will need oral suction at  bedside and recommend continued IV fluids until pt able to  maintain adequate hydration needs on dysphagia diet. Pt will need  to be monitored for fatigue and intake. Placement of alternative  means of nutrition will need to be reconsidered if pt unable to  maintain adequate nutrition by mouth. Pt wishes to avoid feeding  tubes if possible. Pt will benefit from skilled  dysphagia  intervention in acute rehab setting to maximize recovery.  Prognosis for improvement is good given pt motivation, awareness,  family support, and progress made thus far. Recommend repeat  objective study in 5-7 days or as clinically appropriate.      CHL IP TREATMENT RECOMMENDATION 10/01/2014  Treatment Plan Recommendations Defer treatment plan to SLP at  (Comment)     CHL IP DIET RECOMMENDATION 10/01/2014  Diet Recommendations Dysphagia 1 (Puree);Honey-thick liquid  Liquid Administration via Cup  Medication Administration Crushed with puree  Compensations Slow rate;Multiple dry swallows after each  bite/sip;Effortful swallow  Postural Changes and/or Swallow Maneuvers Out of bed for  meals;Seated upright 90 degrees;Upright 30-60 min after meal;Head  turn right during swallow     CHL IP OTHER RECOMMENDATIONS 10/01/2014  Recommended Consults (None)  Oral Care Recommendations Oral care BID;Patient independent with  oral care  Other Recommendations Have oral suction available     CHL IP FOLLOW UP RECOMMENDATIONS 10/01/2014  Follow up Recommendations Inpatient Rehab     CHL IP FREQUENCY AND DURATION 09/28/2014  Speech Therapy Frequency (ACUTE ONLY) min 3x week  Treatment Duration 2 weeks     Pertinent Vitals/Pain VSS    SLP Swallow Goals No flowsheet data found.  No flowsheet data found.    CHL IP REASON FOR REFERRAL 10/01/2014  Reason for Referral Objectively evaluate swallowing function     CHL IP ORAL PHASE 10/01/2014  Lips (None)  Tongue (None)  Mucous membranes (None)  Nutritional status (None)  Other (None)  Oxygen therapy (None)  Oral Phase WFL  Oral - Pudding Teaspoon (None)  Oral - Pudding Cup (None)  Oral - Honey Teaspoon (None)  Oral - Honey Cup (None)  Oral - Honey Syringe (None)  Oral - Nectar Teaspoon (None)  Oral - Nectar Cup (None)  Oral - Nectar Straw (None)  Oral - Nectar Syringe (None)  Oral - Ice Chips (None)  Oral - Thin Teaspoon (None)  Oral - Thin Cup (None)  Oral - Thin Straw (None)  Oral -  Thin Syringe (None)  Oral - Puree (None)  Oral - Mechanical Soft (None)  Oral - Regular (None)  Oral - Multi-consistency (None)  Oral - Pill (None)  Oral Phase - Comment (None)      CHL IP PHARYNGEAL PHASE 10/01/2014  Pharyngeal Phase Impaired  Pharyngeal - Pudding Teaspoon (None)  Penetration/Aspiration details (pudding teaspoon) (None)  Pharyngeal - Pudding Cup (None)  Penetration/Aspiration details (pudding cup) (None)  Pharyngeal - Honey Teaspoon (None)  Penetration/Aspiration details (honey teaspoon) (None)  Pharyngeal - Honey Cup Delayed swallow initiation;Premature  spillage to pyriform sinuses;Reduced pharyngeal  peristalsis;Reduced epiglottic inversion;Reduced anterior  laryngeal mobility;Reduced laryngeal elevation;Pharyngeal residue  - pyriform sinuses;Lateral channel residue;Premature spillage to  valleculae  Penetration/Aspiration details (honey cup) (None)  Pharyngeal - Honey Syringe (None)  Penetration/Aspiration details (honey syringe) (None)  Pharyngeal - Nectar Teaspoon (None)  Penetration/Aspiration details (nectar teaspoon) (None)  Pharyngeal - Nectar Cup Delayed swallow initiation;Premature  spillage to pyriform sinuses;Premature spillage to  valleculae;Reduced epiglottic inversion;Reduced pharyngeal  peristalsis;Reduced anterior laryngeal mobility;Reduced laryngeal  elevation;Reduced airway/laryngeal closure;Penetration/Aspiration  during swallow;Pharyngeal residue - pyriform sinuses;Lateral  channel residue  Penetration/Aspiration details (nectar cup) Material enters  airway, remains ABOVE vocal cords then ejected out  Pharyngeal - Nectar Straw (None)  Penetration/Aspiration details (nectar straw) (None)  Pharyngeal - Nectar Syringe (None)  Penetration/Aspiration details (nectar syringe) (None)  Pharyngeal - Ice Chips (None)  Penetration/Aspiration details (ice chips) (None)  Pharyngeal - Thin Teaspoon (None)  Penetration/Aspiration details (thin teaspoon) (None)  Pharyngeal - Thin Cup Delayed  swallow initiation;Premature  spillage to valleculae;Premature spillage to pyriform  sinuses;Reduced pharyngeal peristalsis;Reduced epiglottic  inversion;Reduced anterior laryngeal mobility;Reduced laryngeal  elevation;Reduced airway/laryngeal closure;Penetration/Aspiration  during swallow;Trace aspiration;Penetration/Aspiration before  swallow;Pharyngeal residue - pyriform sinuses;Pharyngeal residue  - valleculae;Lateral channel residue  Penetration/Aspiration details (thin cup) Material enters airway,  remains ABOVE vocal cords then ejected out;Material enters  airway, passes BELOW cords then ejected out  Pharyngeal - Thin Straw (None)  Penetration/Aspiration details (thin straw) (None)  Pharyngeal - Thin Syringe (None)  Penetration/Aspiration details (thin syringe') (None)  Pharyngeal - Puree Delayed swallow initiation;Premature spillage  to valleculae;Reduced pharyngeal peristalsis;Reduced epiglottic  inversion;Reduced anterior laryngeal mobility;Reduced laryngeal  elevation;Reduced tongue base retraction;Pharyngeal residue -  valleculae;Pharyngeal residue - pyriform sinuses  Penetration/Aspiration details (puree) (None)  Pharyngeal - Mechanical Soft Premature spillage to  valleculae;Delayed swallow initiation;Reduced epiglottic  inversion;Reduced anterior laryngeal mobility;Reduced laryngeal  elevation;Reduced tongue base retraction;Pharyngeal residue -  valleculae;Pharyngeal residue - pyriform sinuses  Penetration/Aspiration details (mechanical soft) (None)  Pharyngeal - Regular (None)  Penetration/Aspiration details (regular) (None)  Pharyngeal - Multi-consistency (None)  Penetration/Aspiration details (multi-consistency) (None)  Pharyngeal - Pill (None)  Penetration/Aspiration details (pill) (None)  Pharyngeal Comment Head turn to right was effective in reducing  pharyngeal residuals on primary swallow     CHL IP CERVICAL ESOPHAGEAL PHASE 10/01/2014  Cervical Esophageal Phase Impaired  Pudding Teaspoon (None)   Pudding Cup (None)  Honey Teaspoon (None)  Honey Cup (None)  Honey Syringe (None)  Nectar Teaspoon (None)  Nectar Cup Reduced cricopharyngeal relaxation  Nectar Straw (None)  Nectar Syringe (None)  Thin Teaspoon (None)  Thin Cup (None)  Thin Straw (None)  Thin Syringe (None)  Cervical Esophageal Comment evidence of osteophytes, however more  likely negatively impacted by poor hyolaryngeal excursion    No flowsheet data found.        Thank you,  Genene Churn, [place]  [place] 10/01/2014, 12:26 PM    Results for orders placed or performed during the hospital encounter of 10/01/14 (from the past 72 hour(s))  Glucose, capillary     Status: Abnormal   Collection Time: 10/01/14  5:05 PM  Result Value Ref Range   Glucose-Capillary 112 (H) 70 - 99 mg/dL  CBC     Status: Abnormal   Collection Time: 10/01/14  5:35 PM  Result Value Ref Range   WBC 12.4 (H) 4.0 - 10.5 K/uL   RBC 5.00 3.87 - 5.11 MIL/uL   Hemoglobin 14.8 12.0 - 15.0 g/dL   HCT 43.7 36.0 - 46.0 %   MCV 87.4 78.0 - 100.0 fL   MCH 29.6 26.0 - 34.0 pg   MCHC 33.9 30.0 - 36.0 g/dL   RDW 14.3 11.5 - 15.5 %   Platelets 289 150 - 400 K/uL  Glucose, capillary     Status: Abnormal   Collection Time: 10/01/14 11:31 PM  Result Value Ref Range   Glucose-Capillary 152 (H) 70 -  99 mg/dL  CBC WITH DIFFERENTIAL     Status: Abnormal   Collection Time: 10/02/14  5:45 AM  Result Value Ref Range   WBC 10.6 (H) 4.0 - 10.5 K/uL   RBC 5.04 3.87 - 5.11 MIL/uL   Hemoglobin 14.5 12.0 - 15.0 g/dL   HCT 44.4 36.0 - 46.0 %   MCV 88.1 78.0 - 100.0 fL   MCH 28.8 26.0 - 34.0 pg   MCHC 32.7 30.0 - 36.0 g/dL   RDW 14.2 11.5 - 15.5 %   Platelets 272 150 - 400 K/uL   Neutrophils Relative % 70 43 - 77 %   Neutro Abs 7.4 1.7 - 7.7 K/uL   Lymphocytes Relative 22 12 - 46 %   Lymphs Abs 2.4 0.7 - 4.0 K/uL   Monocytes Relative 7 3 - 12 %   Monocytes Absolute 0.8 0.1 - 1.0 K/uL   Eosinophils Relative 1 0 - 5 %   Eosinophils Absolute 0.1 0.0 -  0.7 K/uL   Basophils Relative 0 0 - 1 %   Basophils Absolute 0.0 0.0 - 0.1 K/uL  Comprehensive metabolic panel     Status: Abnormal   Collection Time: 10/02/14  5:45 AM  Result Value Ref Range   Sodium 142 135 - 145 mmol/L    Comment: Please note change in reference range.   Potassium 3.3 (L) 3.5 - 5.1 mmol/L    Comment: Please note change in reference range.   Chloride 108 96 - 112 mEq/L   CO2 23 19 - 32 mmol/L   Glucose, Bld 142 (H) 70 - 99 mg/dL   BUN 14 6 - 23 mg/dL   Creatinine, Ser 0.65 0.50 - 1.10 mg/dL   Calcium 9.0 8.4 - 10.5 mg/dL   Total Protein 7.3 6.0 - 8.3 g/dL   Albumin 3.3 (L) 3.5 - 5.2 g/dL   AST 19 0 - 37 U/L   ALT 18 0 - 35 U/L   Alkaline Phosphatase 104 39 - 117 U/L   Total Bilirubin 0.7 0.3 - 1.2 mg/dL   GFR calc non Af Amer >90 >90 mL/min   GFR calc Af Amer >90 >90 mL/min    Comment: (NOTE) The eGFR has been calculated using the CKD EPI equation. This calculation has not been validated in all clinical situations. eGFR's persistently <90 mL/min signify possible Chronic Kidney Disease.    Anion gap 11 5 - 15  Glucose, capillary     Status: Abnormal   Collection Time: 10/02/14  6:45 AM  Result Value Ref Range   Glucose-Capillary 147 (H) 70 - 99 mg/dL  Glucose, capillary     Status: Abnormal   Collection Time: 10/02/14 11:11 AM  Result Value Ref Range   Glucose-Capillary 162 (H) 70 - 99 mg/dL   Comment 1 Notify RN   Glucose, capillary     Status: Abnormal   Collection Time: 10/02/14  4:32 PM  Result Value Ref Range   Glucose-Capillary 103 (H) 70 - 99 mg/dL  Glucose, capillary     Status: Abnormal   Collection Time: 10/02/14  9:37 PM  Result Value Ref Range   Glucose-Capillary 183 (H) 70 - 99 mg/dL  Glucose, capillary     Status: Abnormal   Collection Time: 10/03/14  6:42 AM  Result Value Ref Range   Glucose-Capillary 159 (H) 70 - 99 mg/dL     HEENT: normal Cardio: RRR and no murmur Resp: CTA B/L and difficulty clearing secretions GI: BS  positive and NT,  ND Extremity:  No Edema Skin:   Intact Neuro: Alert/Oriented, Cranial Nerve Abnormalities R V1,2,3 sensory, Abnormal Sensory reduced in LUE and LLE, Abnormal FMC Ataxic/ dec FMC and Other Right lateral pulsion during sitting Musc/Skel:  Normal Gen NAD, voice quality is hoarse   Assessment/Plan: 1. 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 Physiatrist is providing close team supervision and 24 hour management of active medical problems listed below. Physiatrist and rehab team continue to assess barriers to discharge/monitor patient progress toward functional and medical goals. FIM: FIM - Bathing Bathing Steps Patient Completed: Chest, Right Arm, Left Arm, Abdomen, Right upper leg, Left upper leg Bathing: 3: Mod-Patient completes 5-7 [id number] parts or 50-74%  FIM - Upper Body Dressing/Undressing Upper body dressing/undressing steps patient completed: Thread/unthread right sleeve of front closure shirt/dress, Button/unbutton shirt Upper body dressing/undressing: 3: Mod-Patient completed 50-74% of tasks FIM - Lower Body Dressing/Undressing Lower body dressing/undressing steps patient completed: Thread/unthread right pants leg, Thread/unthread left pants leg, Pull pants up/down, Don/Doff right sock, Don/Doff left sock Lower body dressing/undressing: 3: Mod-Patient completed 50-74% of tasks  FIM - Toileting Toileting steps completed by patient: Adjust clothing prior to toileting, Performs perineal hygiene, Adjust clothing after toileting Toileting: 3: Mod-Patient completed 2 of 3 steps  FIM - TRadio producerDevices: Grab bars Toilet Transfers: 3-To toilet/BSC: Mod A (lift or lower assist), 3-From toilet/BSC: Mod A (lift or lower assist)  FIM - BControl and instrumentation engineer Devices: Arm rests Bed/Chair Transfer: 3: Chair or W/C > Bed: Mod A (lift or lower assist), 5: Sit > Supine: Supervision (verbal cues/safety issues)  FIM - Locomotion: Wheelchair Distance: 75 Locomotion: Wheelchair: 2: Travels 50 - 149 ft with minimal assistance (Pt.>75%) FIM - Locomotion: Ambulation Locomotion: Ambulation Assistive Devices: Other (comment) (HHA) Ambulation/Gait Assistance: 1: +1 Total assist Locomotion: Ambulation: 1: Travels less than 50 ft with total assistance/helper does all (Pt.<25%)  Comprehension Comprehension Mode: Auditory Comprehension: 5-Follows basic conversation/direction: With no assist  Expression Expression Mode: Verbal Expression: 5-Expresses basic needs/ideas: With extra time/assistive device  Social Interaction Social Interaction: 6-Interacts appropriately with others with medication or extra time (anti-anxiety, antidepressant).  Problem Solving Problem Solving: 5-Solves complex 90% of the time/cues < 10% of the time  Memory Memory: 5-Recognizes or recalls 90% of the time/requires cueing < 10% of the time  Medical Problem List and Plan: 1. 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 DVT Prophylaxis/Anticoagulation: SQ Lovenox .Monitor platelet and any signs of bleeding 3. Pain Management: Tylenol 4. Dysphagia. Pured honey thick liquids. Follow-up speech therapy. IV fluids for hydration 5. Neuropsych: This patient is capable of making decisions on her own behalf. 6. Skin/Wound Care: Routine skin checks 7. Fluids/Electrolytes/Nutrition: Strict I & O.Follow up labs 8.Hypertension. No present antihypertensive medication. Patient on Toprol 50 mg daily prior to admission as well as Tribenzor 40-5-12.5 mg daily.resume as tolerated.Monitor with increased mobility 9.diabetes mellitus with peripheral neuropathy. Patient with hemoglobin A1c 8.9. Sliding scale insulin. Check blood sugars before meals and  at bedtime. Patient on Glucophage 500 mg daily prior to admission and resume as tolerated 10.Hyperlipidemia.lipitor 11.COPD/tobacco abuse.Counseling 12.HX Bilat. breast cancer.Follow up oncology out patient 13.  Hypokalemia- may be IV related now on po K LOS (Days) 2 A FACE TO FACE EVALUATION WAS PERFORMED  Najee Manninen E 10/03/2014, 7:42 AM

## 2014-10-03 NOTE — Progress Notes (Signed)
Physical Therapy Session Note  Patient Details  Name: Madison Todd MRN: [id number] Date of Birth: 26-May-1951  Today's Date: 10/03/2014 PT Individual Time: 1300-1400 PT Individual Time Calculation (min): 60 min   Short Term Goals: Week 1:  PT Short Term Goal 1 (Week 1): Pt will perform basic transfers with Min A to R/L PT Short Term Goal 2 (Week 1): Pt will propel WC x100' with S for activity tolerance PT Short Term Goal 3 (Week 1): Pt will ambulate 100' with Mod A of 1 and RW PT Short Term Goal 4 (Week 1): Pt will perform static standing tolerance x3 min with self-corrected midline orientation, min A PT Short Term Goal 5 (Week 1): Pt will perform 5 stairs with Mod A of 1, bil rails  Skilled Therapeutic Interventions/Progress Updates:  neuromuscular re-education via forced use, positioning, VCs, tactile and manual cues, for: -visual scanning L>R to read large font hand written schedule; pt c/o blurriness but not diplopia today -w/c propulsion x 25' using bil UEs, with close supervision and mod cues for midline orientation and steering -trunk shortening/lengthening/rotating in supported sitting> unsupported sitting to facilitate trunk righting; improved during session -gait with weighted grocery cart, max assist due to strong R lean, trunk flexion, poor wt shifting to L  Therapeutic activities: --W/c> mat squat pivot transfer to L with min assist - in sitting, reaching forward and R to retrieve cards with R hand and sort by suit on table     Therapy Documentation Precautions:  Precautions Precautions: Fall Precaution Comments: Rt lateral lean, double vision Restrictions Weight Bearing Restrictions: No   Pain: Pain Assessment Pain Score: 4  Pain Location: Leg Pain Orientation: Right;Upper;Lateral; educated pt about requesting pain meds and rest PRN during tx   Locomotion : Ambulation Ambulation/Gait Assistance: 2: Max Financial controller Distance: 55     See  FIM for current functional status  Therapy/Group: Individual Therapy  Makenna Macaluso 10/03/2014, 4:34 PM

## 2014-10-03 NOTE — Patient Care Conference (Signed)
Inpatient RehabilitationTeam Conference and Plan of Care Update Date: 10/03/2014   Time: 10:56 AM    Patient Name: Madison Todd      Medical Record Number: [id number]  Date of Birth: 06/28/1951 Sex: Female         Room/Bed: 4M04C/4M04C-01 Payor Info: Payor: [employer] MEDICARE / Plan: [place] THN/NTSP / Product Type: *No Product type* /    Admitting Diagnosis: Medulla and cerebellar CVA  Admit Date/Time:  10/01/2014  3:47 PM Admission Comments: No comment available   Primary Diagnosis:  Wallenberg syndrome Principal Problem: Wallenberg syndrome  Patient Active Problem List   Diagnosis Date Noted  . 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 Lacunar infarction 10/01/2014  . 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 CVA (cerebral infarction) 09/28/2014  . TIA (transient ischemic attack) 09/27/2014  . HTN (hypertension) 09/27/2014  . Breast cancer, right breast 12/16/2013  . 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 Adenomatous polyp 10/02/2013  . 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 Cardiomyopathy 07/22/2011  . COPD (chronic obstructive pulmonary disease) 07/22/2011    Expected Discharge Date:    Team Members Present:       Current Status/Progress Goal Weekly Team Focus  Medical             Bowel/Bladder   Continent of bowel and bladder; LBM 1/14 but patient in no pain and has not had any food since prior to stroke on 1/14 until 1/18  Mod I  Assess and treat for constipation prn   Swallow/Nutrition/ Hydration   Dys 2, honey thick liquids; head turn to the right; needs frequent suctioning for thin, clear secretions  supervision for least restrictive diet   pharyngeal strengthening exercises, oral motor exercises, diet toleration    ADL's   Mod assist transfers, mod assist for standing balance during bathing and dressing, RUE ataxia  supervision overall  midline orientation, RUE NMR, sitting/standing  balance, transfers, vision   Mobility   Mod A transfers, total A gait without device/Max A with RW, Max A 5 stairs, Min A WC prop - diplopia and R pushing limits mobility  S transfers and gait x150', Min A stairs, S WC prop  midline orientation, neuro re-ed, sitting/standing balance, transfers, gait, stairs. Visual remediation   Communication             Safety/Cognition/ Behavioral Observations  grossly intact for basic tasks/information   Supervision  Hourly round   Pain   No c/o pain  < 3  Assess and treat for pain q shift and prn   Skin   No skin breakdown or infection noted  Mod I assist  Assess skin q shift and prn      *See Care Plan and progress notes for long and short-term goals.  Barriers to Discharge:      Possible Resolutions to Barriers:       Discharge Planning/Teaching Needs:  Family needs to come up with a plan for both pt and Aunt who needs care.      Team Discussion:    Revisions to Treatment Plan:        Charlett Blake 10/03/2014, 10:56 AM

## 2014-10-03 NOTE — Progress Notes (Signed)
Occupational Therapy Session Note  Patient Details  Name: Madison Todd MRN: [id number] Date of Birth: 08/13/1951   Today's Date: 10/03/2014 OT Individual Time: 3267-1245 OT Individual Time Calculation (min): 45 min    Short Term Goals: Week 1:  OT Short Term Goal 1 (Week 1): Pt will complete toilet transfer with min assist OT Short Term Goal 2 (Week 1): Pt will complete bathing with min assist for standing balance OT Short Term Goal 3 (Week 1): Pt will complete UB dressing with min assist OT Short Term Goal 4 (Week 1): Pt will complete 2 grooming tasks in standing with min assist for standing balance  Skilled Therapeutic Interventions/Progress Updates:    Pt seen for BADL retraining of shower, dressing, and grooming with a focus on dynamic sitting balance and midline control, sit to stand and standing with UE support.  Pt very eager and motivated. Pt in w/c at start of session and transferred to shower with grab bar with mod A. Pt needed min A to support sitting balance in shower as she constantly leaned to R, but could self correct for brief periods of time by bearing more weight through her L hip. Pt stood briefly in shower with mod A to support balance. Pt also needed standing support balance to don pants over hips. Pt actively used RUE throughout session. She has difficulty moving R arm smoothly but can unfasten buttons and tie her shoes.  Due to unsteady sitting balance, a quick release belt applied to pt in w/c. Explained to pt purpose of the belt.  Pt remained in w/c to complete her grooming at the sink.  Therapy Documentation Precautions:  Precautions Precautions: Fall Precaution Comments: Rt lateral lean, double vision Restrictions Weight Bearing Restrictions: No    Vital Signs: Therapy Vitals Temp: 98 F (36.7 C) Temp Source: Oral Pulse Rate: 64 Resp: 18 BP: (!) 153/81 mmHg Patient Position (if appropriate): Lying Oxygen Therapy SpO2: 94 % O2 Device: Not  Delivered Pain: Pain Assessment Pain Assessment: No/denies pain Pain Score: 0-No pain ADL:  See FIM for current functional status  Therapy/Group: Individual Therapy  [place] 10/03/2014, 8:37 AM

## 2014-10-03 NOTE — Progress Notes (Signed)
Occupational Therapy Session Note  Patient Details  Name: Madison Todd MRN: [id number] Date of Birth: April 22, 1951  Today's Date: 10/03/2014 OT Individual Time: 1430-1500 OT Individual Time Calculation (min): 30 min    Short Term Goals: Week 1:  OT Short Term Goal 1 (Week 1): Pt will complete toilet transfer with min assist OT Short Term Goal 2 (Week 1): Pt will complete bathing with min assist for standing balance OT Short Term Goal 3 (Week 1): Pt will complete UB dressing with min assist OT Short Term Goal 4 (Week 1): Pt will complete 2 grooming tasks in standing with min assist for standing balance  Skilled Therapeutic Interventions/Progress Updates:    Engaged in therapeutic activities with focus on NMR in sitting and standing balance and RUE ataxia.  Engaged in reaching activity in unsupported sitting with feet off floor to challenge dynamic sitting balance.  Pt tends to lean to Rt in sitting and standing, tactile cue at Rt hip and Lt to promote awareness of weight shift and promote midline awareness.  Min assist in unsupported sitting with dynamic movement, min-supervision static sitting.  Progressed to standing with reaching activity requiring mod-max assist when reaching to Lt, min-mod when reaching to Rt all with RUE.  Pt continues to overshoot when reaching for items, cues to visually attend to RUE to promote increased motor control. Engaged in stand > sit x3 with focus on increased motor control as tends to collapse to Rt with sitting.  Squat pivot mod assist to Rt, min to Lt.  Therapy Documentation Precautions:  Precautions Precautions: Fall Precaution Comments: Rt lateral lean, double vision Restrictions Weight Bearing Restrictions: No General:   Vital Signs: Therapy Vitals Temp: 98 F (36.7 C) Temp Source: Oral Pulse Rate: 75 Resp: 18 BP: 117/77 mmHg Patient Position (if appropriate): Sitting Oxygen Therapy SpO2: 98 % O2 Device: Not Delivered Pain: Pain  Assessment Pain Score: 4  Pain Location: Leg Pain Orientation: Right;Upper;Lateral  See FIM for current functional status  Therapy/Group: Individual Therapy  Simonne Come 10/03/2014, 3:12 PM

## 2014-10-03 NOTE — Patient Care Conference (Signed)
Inpatient RehabilitationTeam Conference and Plan of Care Update Date: 10/03/2014   Time: 11;00 AM    Patient Name: Madison Todd      Medical Record Number: [id number]  Date of Birth: 1951/06/24 Sex: Female         Room/Bed: 4M04C/4M04C-01 Payor Info: Payor: [employer] MEDICARE / Plan: [place] THN/NTSP / Product Type: *No Product type* /    Admitting Diagnosis: Medulla and cerebellar CVA  Admit Date/Time:  10/01/2014  3:47 PM Admission Comments: No comment available   Primary Diagnosis:  Wallenberg syndrome Principal Problem: Wallenberg syndrome  Patient Active Problem List   Diagnosis Date Noted  . 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 Lacunar infarction 10/01/2014  . 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 CVA (cerebral infarction) 09/28/2014  . TIA (transient ischemic attack) 09/27/2014  . HTN (hypertension) 09/27/2014  . Breast cancer, right breast 12/16/2013  . 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 Adenomatous polyp 10/02/2013  . 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 Cardiomyopathy 07/22/2011  . COPD (chronic obstructive pulmonary disease) 07/22/2011    Expected Discharge Date: Expected Discharge Date: 10/18/14  Team Members Present: Physician leading conference: Dr. Alysia Penna Social Worker Present: Ovidio Kin, LCSW Nurse Present: Heather Roberts, RN PT Present: Georjean Mode, PT;Blair Hobble, PT OT Present: Simonne Come, Dorothyann Gibbs, OT SLP Present: Windell Moulding, SLP PPS Coordinator present : Daiva Nakayama, RN, CRRN     Current Status/Progress Goal Weekly Team Focus  Medical   heavy lean to R, dysphagia  home d/c  Emphasize swallow precautions   Bowel/Bladder   Continent of bowel and bladder; LBM 1/14 but patient in no pain and has not had any food since prior to stroke on 1/14 until 1/18  Mod I  Assess and treat for constipation prn   Swallow/Nutrition/ Hydration   Dys 2,  honey thick liquids; head turn to the right; needs frequent suctioning for thin, clear secretions  supervision for least restrictive diet   pharyngeal strengthening exercises, oral motor exercises, diet toleration    ADL's   Mod assist transfers, mod assist for standing balance during bathing and dressing, RUE ataxia  supervision overall  midline orientation, RUE NMR, sitting/standing balance, transfers, vision   Mobility   Mod A transfers, total A gait without device/Max A with RW, Max A 5 stairs, Min A WC prop - diplopia and R pushing limits mobility  S transfers and gait x150', Min A stairs, S WC prop  midline orientation, neuro re-ed, sitting/standing balance, transfers, gait, stairs. Visual remediation   Communication             Safety/Cognition/ Behavioral Observations  grossly intact for basic tasks/information   Supervision  Hourly round   Pain   No c/o pain  < 3  Assess and treat for pain q shift and prn   Skin   No skin breakdown or infection noted  Mod I assist  Assess skin q shift and prn      *See Care Plan and progress notes for long and short-term goals.  Barriers to Discharge: will need 24/7 care    Possible Resolutions to Barriers:  ID caregivers    Discharge Planning/Teaching Needs:  Family needs to come up with a plan for both pt and Aunt who needs care.      Team Discussion:  Goals su[pervsiion/min level. swallowing issues-upgraded diet to Dys 2 honey liquids. Trying to not use suction as much  and swallow her secretions. Need to come up with a discharge plan for her and Aunt which sh cared for PTA  Revisions to Treatment Plan:  Upgraded diet to Dys 2 textures   Continued Need for Acute Rehabilitation Level of Care: The patient requires daily medical management by a physician with specialized training in physical medicine and rehabilitation for the following conditions: Daily direction of a multidisciplinary physical rehabilitation program to ensure safe  treatment while eliciting the highest outcome that is of practical value to the patient.: Yes Daily medical management of patient stability for increased activity during participation in an intensive rehabilitation regime.: Yes Daily analysis of laboratory values and/or radiology reports with any subsequent need for medication adjustment of medical intervention for : Neurological problems;Pulmonary problems  Yee Joss, Gardiner Rhyme 10/03/2014, 1:18 PM

## 2014-10-03 NOTE — Progress Notes (Signed)
Speech Language Pathology Daily Session Note  Patient Details  Name: Madison Todd MRN: [id number] Date of Birth: 1951-02-24  Today's Date: 10/03/2014 SLP Individual Time: 2263-3354 SLP Individual Time Calculation (min): 45 min  Short Term Goals: Week 1: SLP Short Term Goal 1 (Week 1): Pt will tolerate presentations of her currently prescribed diet wtih no overt s/s of aspiration and supervision cues for use of swallowing precautions.  SLP Short Term Goal 2 (Week 1): Pt will tolerate trials of advanced consistencies with no overt s/s of aspiraiton and supervision cues for use of swallowing precautions.  SLP Short Term Goal 3 (Week 1): Pt will return demonstration of oral motor strengthening exercises targeting improved right sided strength, range of motion, and coordination for mastication of solid textures with supervision cues.  SLP Short Term Goal 4 (Week 1): Pt will return demonstration of pharyngeal strengthening exercises to improve swallowing function and to continue working towards diet advancement with supervision cues   Skilled Therapeutic Interventions:  Pt was seen for skilled ST targeting goals for dysphagia.  Upon arrival, pt was partially reclined in bed, awake, alert, and agreeable to participate in [place].  Pt was transferred to wheelchair to maximize alertness and swallowing safety. SLP facilitated the session with a trial meal tray of advanced dys 2 textures.  Pt exhibited slightly prolonged but effective mastication of upgraded solid consistencies with complete clearance of residuals from the oral cavity with extra time.  Pt utilized recommended swallowing precautions with supervision cues with immediate cough x3 following mixed solid and liquid consistencies, which SLP suspects to be related to pharyngeal residue per MBS.  Recommend a diet upgrade to dys 2 consistencies with continued honey thick liquids and full supervision.  Upon completion of meal, SLP initiated skilled  education related to pharyngeal strengthening exercises with pt able to return demonstration of exercises with supervision cues.    FIM:  Comprehension Comprehension Mode: Auditory Comprehension: 5-Follows basic conversation/direction: With no assist Expression Expression Mode: Verbal Expression: 5-Expresses basic needs/ideas: With extra time/assistive device Social Interaction Social Interaction: 6-Interacts appropriately with others with medication or extra time (anti-anxiety, antidepressant). Problem Solving Problem Solving: 5-Solves complex 90% of the time/cues < 10% of the time Memory Memory: 5-Recognizes or recalls 90% of the time/requires cueing < 10% of the time FIM - Eating Eating Activity: 5: Supervision/cues  Pain Pain Assessment Pain Assessment: No/denies pain  Therapy/Group: Individual Therapy  Aanika Defoor, Selinda Orion 10/03/2014, 8:23 PM

## 2014-10-03 NOTE — Progress Notes (Signed)
Social Work Patient ID: Madison Todd, female   DOB: April 07, 1951, 65 y.o.   MRN: [id number] Met with pt and spoke with son-Nate via telephone to inform of team conference goals -supervision and targeted discharge 2/4.  She feels this is too long but is agreeable Discussed if her progress is quicker than can move up date.  Son would like her to be as high level as possible before returning home. They are currently providing supervision to their aunt. They will work on the plan and discuss with other family to see if they can assist at home.

## 2014-10-04 ENCOUNTER — Inpatient Hospital Stay (HOSPITAL_COMMUNITY): Payer: Commercial Managed Care - HMO

## 2014-10-04 ENCOUNTER — Inpatient Hospital Stay (HOSPITAL_COMMUNITY): Payer: Commercial Managed Care - HMO | Admitting: Occupational Therapy

## 2014-10-04 ENCOUNTER — Ambulatory Visit (HOSPITAL_COMMUNITY): Payer: Commercial Managed Care - HMO | Admitting: Speech Pathology

## 2014-10-04 LAB — GLUCOSE, CAPILLARY
Glucose-Capillary: 146 mg/dL — ABNORMAL HIGH (ref 70–99)
Glucose-Capillary: 180 mg/dL — ABNORMAL HIGH (ref 70–99)
Glucose-Capillary: 135 mg/dL — ABNORMAL HIGH (ref 70–99)
Glucose-Capillary: 228 mg/dL — ABNORMAL HIGH (ref 70–99)

## 2014-10-04 MED ORDER — SODIUM CHLORIDE 0.9 % IV SOLN
INTRAVENOUS | Status: AC
  Administered 2014-10-04: 18:00:00 via INTRAVENOUS

## 2014-10-04 MED ORDER — POTASSIUM CHLORIDE CRYS ER 20 MEQ PO TBCR
20.0000 meq | EXTENDED_RELEASE_TABLET | Freq: Every day | ORAL | Status: DC
  Administered 2014-10-05 – 2014-10-23 (×19): 20 meq via ORAL
  Filled 2014-10-04 (×20): qty 1

## 2014-10-04 NOTE — IPOC Note (Signed)
Overall Plan of Care 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     Functional Problem List: Nursing Bowel, Endurance, Medication Management, Motor, Nutrition, Safety, Sensory  PT Balance, Endurance, Motor, Perception, Safety, Sensory  OT Balance, Endurance, Motor, Vision  SLP Nutrition  TR         Basic ADL's: OT Eating, Grooming, Bathing, Dressing, Toileting     Advanced  ADL's: OT Simple Meal Preparation, Laundry     Transfers: PT Bed Mobility, Bed to Chair, Car, [employer], Futures trader, Metallurgist: PT Ambulation, Stairs     Additional Impairments: OT Fuctional Use of Upper Extremity  SLP Swallowing      TR      Anticipated Outcomes Item Anticipated Outcome  Self Feeding Supervision  Swallowing  Supervision with least restrictive diet    Basic self-care  Supervision  Toileting  Supervision   Bathroom Transfers Supervision  Bowel/Bladder  Mod I  Transfers  Supervision  Locomotion  Supervision with LRAD  Communication     Cognition     Pain  < 3  Safety/Judgment      Therapy Plan: PT Intensity: Minimum of 1-2 x/day ,45 to 90 minutes PT Frequency: 5 out of 7 days PT Duration Estimated Length of Stay: 14-16 days OT Intensity: Minimum of 1-2 x/day, 45 to 90 minutes OT Frequency: 5 out of 7 days OT Duration/Estimated Length of Stay: 14-18 days SLP Intensity: Minumum of 1-2 x/day, 30 to 90 minutes SLP Frequency: 5 out of 7 days SLP Duration/Estimated Length of Stay: 14-21 days        Team Interventions: Nursing Interventions Patient/Family Education, Bowel Management, Disease Management/Prevention, Medication Management,  Dysphagia/Aspiration Precaution Training, Discharge Planning  PT interventions Ambulation/gait training, Training and development officer, Community reintegration, Discharge planning, DME/adaptive equipment instruction, Disease management/prevention, Functional mobility training, Neuromuscular re-education, Patient/family education, Pain management, Psychosocial support, Stair training, Therapeutic Activities, Therapeutic Exercise, UE/LE Strength taining/ROM, UE/LE Coordination activities, Visual/perceptual remediation/compensation  OT Interventions Training and development officer, Discharge planning, Disease mangement/prevention, DME/adaptive equipment instruction, Functional mobility training, Neuromuscular re-education, Pain management, Patient/family education, Psychosocial support, Self Care/advanced ADL retraining, Therapeutic Activities, Therapeutic Exercise, UE/LE Strength taining/ROM, UE/LE Coordination activities, Visual/perceptual remediation/compensation  SLP Interventions Dysphagia/aspiration precaution training, Functional tasks, Patient/family education, Oral motor exercises, Internal/external aids, Environmental controls  TR Interventions    SW/CM Interventions Discharge Planning, Psychosocial Support, Patient/Family Education    Team Discharge Planning: Destination: PT-Home ,OT- Home , SLP- (TBD) Projected Follow-up: PT-Home health PT, 24 hour supervision/assistance, OT-  Home health OT, 24 hour supervision/assistance, SLP-Home Health SLP, 24 hour supervision/assistance, Outpatient SLP Projected Equipment Needs: PT-Rolling walker with 5" wheels, To be determined, OT- 3 in 1 bedside comode, Tub/shower bench, SLP-  Equipment Details: PT-May have some DME from mother's care in past, OT-  Patient/family involved in discharge planning: PT- Patient,  OT-Patient, SLP-Patient  MD ELOS: 9-12d Medical Rehab Prognosis:  Excellent Assessment: 64 year old right handed female with history of  leukocytoclastic vasculitis, diabetes mellitus and peripheral neuropathy, cardiomyopathy, COPD/tobacco abuse, hypertension and bilateral breast cancer with lumpectomy as well as chemoradiation. Presented 09/27/2014 with severe headache, blurred vision, right-sided weakness and difficulty in swallowing. Initial cranial CT scan negative. MRI of the brain with acute right lateral medullary infarct as well  as suspected inferior right lateral cerebellar hemisphere involvement. MRA of the head with some artifactual signal loss in the mid V4 segment with a suspected underlying 1 cm 75% stenosis on the right. Echocardiogram with ejection fraction of 82% grade 1 diastolic dysfunction   Now requiring 24/7 Rehab RN,MD, as well as CIR level PT, OT and SLP.  Treatment team will focus on ADLs and mobility with goals set at Sup   See Team Conference Notes for weekly updates to the plan of care

## 2014-10-04 NOTE — Progress Notes (Signed)
Speech Language Pathology Daily Session Note  Patient Details  Name: Madison Todd MRN: [id number] Date of Birth: Apr 27, 1951  Today's Date: 10/04/2014 SLP Concurrent Time: 0800-0900 SLP Concurrent Time Calculation (min): 60 min   Short Term Goals: Week 1: SLP Short Term Goal 1 (Week 1): Pt will tolerate presentations of her currently prescribed diet wtih no overt s/s of aspiration and supervision cues for use of swallowing precautions.  SLP Short Term Goal 2 (Week 1): Pt will tolerate trials of advanced consistencies with no overt s/s of aspiraiton and supervision cues for use of swallowing precautions.  SLP Short Term Goal 3 (Week 1): Pt will return demonstration of oral motor strengthening exercises targeting improved right sided strength, range of motion, and coordination for mastication of solid textures with supervision cues.  SLP Short Term Goal 4 (Week 1): Pt will return demonstration of pharyngeal strengthening exercises to improve swallowing function and to continue working towards diet advancement with supervision cues   Skilled Therapeutic Interventions:  Pt was seen for skilled ST targeting dysphagia goals.  Upon arrival, pt was seated upright in wheelchair awake, alert, and agreeable to participate in ST.  SLP facilitated the session with supervision cues for use of swallowing precautions during presentations of pt's currently prescribed diet.  Pt exhibited immediate cough x2 following mixed solid and liquid consistencies which SLP suspects was related to sensed aspiration/penetration of pharyngeal residue per MBS.  Pt reports that she has been trialing her current diet with no head turn and demonstrated for SLP with no overt s/s of aspiration over limited trials.  Continue to recommend head turn to the right during PO until she can consistently demonstrate no overt s/s of aspiration over several consecutive sessions.  Continue per current plan of care.    FIM:   Comprehension Comprehension Mode: Auditory Comprehension: 5-Understands complex 90% of the time/Cues < 10% of the time Expression Expression Mode: Verbal Expression: 5-Expresses basic needs/ideas: With extra time/assistive device Social Interaction Social Interaction: 6-Interacts appropriately with others with medication or extra time (anti-anxiety, antidepressant). Problem Solving Problem Solving: 6-Solves complex problems: With extra time Memory Memory: 6-More than reasonable amt of time FIM - Eating Eating Activity: 5: Supervision/cues  Pain Pain Assessment Pain Assessment: No/denies pain Pain Score: 0-No pain  Therapy/Group: Individual Therapy and Other: Concurrent therapy session   PageSelinda Orion 10/04/2014, 2:26 PM

## 2014-10-04 NOTE — Progress Notes (Signed)
64 year old right handed female with history of leukocytoclastic vasculitis, diabetes mellitus and peripheral neuropathy, cardiomyopathy, COPD/tobacco abuse, hypertension and bilateral breast cancer with lumpectomy as well as chemoradiation. Presented 09/27/2014 with severe headache, blurred vision, right-sided weakness and difficulty in swallowing. Initial cranial CT scan negative. MRI of the brain with acute right lateral medullary infarct as well as suspected inferior right lateral cerebellar hemisphere involvement. MRA of the head with some artifactual signal loss in the mid V4 segment with a suspected underlying 1 cm 75% stenosis on the right. Echocardiogram with ejection fraction of 84% grade 1 diastolic dysfunction. Carotid Dopplers with no ICA stenosis. Patient did not receive TPA. Subjective/Complaints: No pain complaints, no cough or SOB R side face still numb  Review of Systems - Negative except swallowing and balance problems Objective: Vital Signs: Blood pressure 141/69, pulse 72, temperature 98.3 F (36.8 C), temperature source Oral, resp. rate 18, weight 68.539 kg (151 lb 1.6 oz), SpO2 95 %. No results found. Results for orders placed or performed during the hospital encounter of 10/01/14 (from the past 72 hour(s))  Glucose, capillary     Status: Abnormal   Collection Time: 10/01/14  5:05 PM  Result Value Ref Range   Glucose-Capillary 112 (H) 70 - 99 mg/dL  CBC     Status: Abnormal   Collection Time: 10/01/14  5:35 PM  Result Value Ref Range   WBC 12.4 (H) 4.0 - 10.5 K/uL   RBC 5.00 3.87 - 5.11 MIL/uL   Hemoglobin 14.8 12.0 - 15.0 g/dL   HCT 43.7 36.0 - 46.0 %   MCV 87.4 78.0 - 100.0 fL   MCH 29.6 26.0 - 34.0 pg   MCHC 33.9 30.0 - 36.0 g/dL   RDW 14.3 11.5 - 15.5 %   Platelets 289 150 - 400 K/uL  Glucose, capillary     Status: Abnormal   Collection Time: 10/01/14 11:31 PM  Result Value Ref Range   Glucose-Capillary 152 (H) 70 - 99 mg/dL  CBC WITH DIFFERENTIAL      Status: Abnormal   Collection Time: 10/02/14  5:45 AM  Result Value Ref Range   WBC 10.6 (H) 4.0 - 10.5 K/uL   RBC 5.04 3.87 - 5.11 MIL/uL   Hemoglobin 14.5 12.0 - 15.0 g/dL   HCT 44.4 36.0 - 46.0 %   MCV 88.1 78.0 - 100.0 fL   MCH 28.8 26.0 - 34.0 pg   MCHC 32.7 30.0 - 36.0 g/dL   RDW 14.2 11.5 - 15.5 %   Platelets 272 150 - 400 K/uL   Neutrophils Relative % 70 43 - 77 %   Neutro Abs 7.4 1.7 - 7.7 K/uL   Lymphocytes Relative 22 12 - 46 %   Lymphs Abs 2.4 0.7 - 4.0 K/uL   Monocytes Relative 7 3 - 12 %   Monocytes Absolute 0.8 0.1 - 1.0 K/uL   Eosinophils Relative 1 0 - 5 %   Eosinophils Absolute 0.1 0.0 - 0.7 K/uL   Basophils Relative 0 0 - 1 %   Basophils Absolute 0.0 0.0 - 0.1 K/uL  Comprehensive metabolic panel     Status: Abnormal   Collection Time: 10/02/14  5:45 AM  Result Value Ref Range   Sodium 142 135 - 145 mmol/L    Comment: Please note change in reference range.   Potassium 3.3 (L) 3.5 - 5.1 mmol/L    Comment: Please note change in reference range.   Chloride 108 96 - 112 mEq/L   CO2 23  19 - 32 mmol/L   Glucose, Bld 142 (H) 70 - 99 mg/dL   BUN 14 6 - 23 mg/dL   Creatinine, Ser 0.65 0.50 - 1.10 mg/dL   Calcium 9.0 8.4 - 10.5 mg/dL   Total Protein 7.3 6.0 - 8.3 g/dL   Albumin 3.3 (L) 3.5 - 5.2 g/dL   AST 19 0 - 37 U/L   ALT 18 0 - 35 U/L   Alkaline Phosphatase 104 39 - 117 U/L   Total Bilirubin 0.7 0.3 - 1.2 mg/dL   GFR calc non Af Amer >90 >90 mL/min   GFR calc Af Amer >90 >90 mL/min    Comment: (NOTE) The eGFR has been calculated using the CKD EPI equation. This calculation has not been validated in all clinical situations. eGFR's persistently <90 mL/min signify possible Chronic Kidney Disease.    Anion gap 11 5 - 15  Glucose, capillary     Status: Abnormal   Collection Time: 10/02/14  6:45 AM  Result Value Ref Range   Glucose-Capillary 147 (H) 70 - 99 mg/dL  Glucose, capillary     Status: Abnormal   Collection Time: 10/02/14 11:11 AM  Result Value  Ref Range   Glucose-Capillary 162 (H) 70 - 99 mg/dL   Comment 1 Notify RN   Glucose, capillary     Status: Abnormal   Collection Time: 10/02/14  4:32 PM  Result Value Ref Range   Glucose-Capillary 103 (H) 70 - 99 mg/dL  Glucose, capillary     Status: Abnormal   Collection Time: 10/02/14  9:37 PM  Result Value Ref Range   Glucose-Capillary 183 (H) 70 - 99 mg/dL  Glucose, capillary     Status: Abnormal   Collection Time: 10/03/14  6:42 AM  Result Value Ref Range   Glucose-Capillary 159 (H) 70 - 99 mg/dL  Glucose, capillary     Status: Abnormal   Collection Time: 10/03/14 11:34 AM  Result Value Ref Range   Glucose-Capillary 165 (H) 70 - 99 mg/dL  Glucose, capillary     Status: Abnormal   Collection Time: 10/03/14  4:34 PM  Result Value Ref Range   Glucose-Capillary 120 (H) 70 - 99 mg/dL  Glucose, capillary     Status: Abnormal   Collection Time: 10/03/14  8:40 PM  Result Value Ref Range   Glucose-Capillary 204 (H) 70 - 99 mg/dL  Glucose, capillary     Status: Abnormal   Collection Time: 10/04/14  6:55 AM  Result Value Ref Range   Glucose-Capillary 146 (H) 70 - 99 mg/dL     HEENT: normal Cardio: RRR and no murmur Resp: CTA B/L and difficulty clearing secretions GI: BS positive and NT, ND Extremity:  No Edema Skin:   Intact Neuro: Alert/Oriented, Cranial Nerve Abnormalities R V1,2,3 sensory, Abnormal Sensory reduced in LUE and LLE, Abnormal FMC Ataxic/ dec FMC and Other Right lateral pulsion during sitting Musc/Skel:  Normal Gen NAD, voice quality is hoarse   Assessment/Plan: 1. 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 Physiatrist is providing close team supervision and 24 hour management of active medical problems listed below. Physiatrist and rehab team continue to assess barriers to discharge/monitor patient progress  toward functional and medical goals. FIM: FIM - Bathing Bathing Steps Patient Completed: Chest, Right Arm, Left Arm, Abdomen, Right upper leg, Left upper leg, Front perineal area, Buttocks, Right lower leg (including foot), Left  lower leg (including foot) Bathing: 4: Steadying assist  FIM - Upper Body Dressing/Undressing Upper body dressing/undressing steps patient completed: Thread/unthread right sleeve of front closure shirt/dress, Button/unbutton shirt, Thread/unthread left sleeve of front closure shirt/dress, Pull shirt around back of front closure shirt/dress, Thread/unthread right sleeve of pullover shirt/dresss, Thread/unthread left sleeve of pullover shirt/dress, Put head through opening of pull over shirt/dress, Pull shirt over trunk Upper body dressing/undressing: 4: Steadying assist FIM - Lower Body Dressing/Undressing Lower body dressing/undressing steps patient completed: Thread/unthread right pants leg, Thread/unthread left pants leg, Pull pants up/down, Don/Doff right sock, Don/Doff left sock, Thread/unthread right underwear leg, Thread/unthread left underwear leg, Pull underwear up/down Lower body dressing/undressing: 4: Steadying Assist  FIM - Toileting Toileting steps completed by patient: Adjust clothing prior to toileting, Performs perineal hygiene, Adjust clothing after toileting Toileting: 3: Mod-Patient completed 2 of 3 steps  FIM - Radio producer Devices: Grab bars Toilet Transfers: 0-Activity did not occur  FIM - Control and instrumentation engineer Devices: Arm rests Bed/Chair Transfer: 3: Chair or W/C > Bed: Mod A (lift or lower assist)  FIM - Locomotion: Wheelchair Distance: 55 Locomotion: Wheelchair: 2: Travels 50 - 149 ft with supervision, cueing or coaxing FIM - Locomotion: Ambulation Locomotion: Ambulation Assistive Devices: Other (comment) (weighted grocery cart) Ambulation/Gait Assistance: 2: Max  assist Locomotion: Ambulation: 1: Travels less than 50 ft with maximal assistance (Pt: 25 - 49%)  Comprehension Comprehension Mode: Auditory Comprehension: 5-Follows basic conversation/direction: With no assist  Expression Expression Mode: Verbal Expression: 5-Expresses basic needs/ideas: With extra time/assistive device  Social Interaction Social Interaction: 6-Interacts appropriately with others with medication or extra time (anti-anxiety, antidepressant).  Problem Solving Problem Solving: 5-Solves complex 90% of the time/cues < 10% of the time  Memory Memory: 5-Recognizes or recalls 90% of the time/requires cueing < 10% of the time  Medical Problem List and Plan: 1. 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 DVT Prophylaxis/Anticoagulation: SQ Lovenox .Monitor platelet and any signs of bleeding 3. Pain Management: Tylenol 4. Dysphagia. Pured honey thick liquids. Follow-up speech therapy. IV fluids for hydration 5. Neuropsych: This patient is capable of making decisions on her own behalf. 6. Skin/Wound Care: Routine skin checks 7. Fluids/Electrolytes/Nutrition: Strict I & O.Follow up labs 8.Hypertension. No present antihypertensive medication. Patient on Toprol 50 mg daily prior to admission as well as Tribenzor 40-5-12.5 mg daily.resume as tolerated.Monitor with increased mobility 9.diabetes mellitus with peripheral neuropathy. Patient with hemoglobin A1c 8.9. Sliding scale insulin. Check blood sugars before meals and at bedtime. Patient on Glucophage 500 mg daily prior to admission and resume as tolerated 10.Hyperlipidemia.lipitor 11.COPD/tobacco abuse.Counseling 12.HX Bilat. breast cancer.Follow up oncology out patient 13.  Hypokalemia- may be IV related now on po K LOS (Days) 3 A FACE TO FACE EVALUATION WAS PERFORMED  KIRSTEINS,ANDREW E 10/04/2014, 7:14 AM

## 2014-10-04 NOTE — Progress Notes (Signed)
Physical Therapy Session Note  Patient Details  Name: Madison Todd MRN: [id number] Date of Birth: 1950/11/25  Today's Date: 10/04/2014 PT Individual Time: 1300-1400 PT Individual Time Calculation (min): 60 min   Short Term Goals: Week 1:  PT Short Term Goal 1 (Week 1): Pt will perform basic transfers with Min A to R/L PT Short Term Goal 2 (Week 1): Pt will propel WC x100' with S for activity tolerance PT Short Term Goal 3 (Week 1): Pt will ambulate 100' with Mod A of 1 and RW PT Short Term Goal 4 (Week 1): Pt will perform static standing tolerance x3 min with self-corrected midline orientation, min A PT Short Term Goal 5 (Week 1): Pt will perform 5 stairs with Mod A of 1, bil rails  Skilled Therapeutic Interventions/Progress Updates:   Pt reported no diplopia, but vision is "just different".  tx focused on neuromuscular re-education via mirror feedback, VCs, manual cues, metronome for: -w/c propulsion using bil hands with cues due to veering L due to poor coordination of timing bil UEs -Kinetron at 20 cm/sec for midline orientation during dynamic sitting progressing from bil UE support> no support - Kinetron at 20 cm/sec for midline orientation and wt shift to L during dynamic stepping in standing, progressing from bil UE support> LUE support   Transfers: w/c> Kinetron to R; w/c> bed to L with mod assist for control of midline during descent. Sit> supine with supervision.    Bed alarm set and all needs placed within reach. Therapy Documentation Precautions:  Precautions Precautions: Fall Precaution Comments: Rt lateral lean, double vision Restrictions Weight Bearing Restrictions: No   Pain: Pain Assessment Pain Assessment: No/denies pain   Locomotion : Ambulation Ambulation/Gait Assistance: 2: Max assist Wheelchair Mobility Distance: 55      See FIM for current functional status  Therapy/Group: Individual Therapy  [name] 10/04/2014, 3:55 PM

## 2014-10-04 NOTE — Progress Notes (Signed)
Occupational Therapy Session Note  Patient Details  Name: Madison Todd MRN: [id number] Date of Birth: [birth year]  Today's Date: 10/04/2014 OT Individual Time: 4037-0964 OT Individual Time Calculation (min): 60 min    Short Term Goals: Week 1:  OT Short Term Goal 1 (Week 1): Pt will complete toilet transfer with min assist OT Short Term Goal 2 (Week 1): Pt will complete bathing with min assist for standing balance OT Short Term Goal 3 (Week 1): Pt will complete UB dressing with min assist OT Short Term Goal 4 (Week 1): Pt will complete 2 grooming tasks in standing with min assist for standing balance  Skilled Therapeutic Interventions/Progress Updates:    Pt seen for BADL retraining of bathing, dressing, grooming with a focus on trunk control, postural awareness, balance and activity tolerance.  Pt in w/c stating she felt very tired today but was willing to work in therapy. Pt opted for a sponge bath at sink. Pt needs cues with sit><stand for hand positioning. Pt would frequently drift R in standing at sink with one near LOB to her R. Pt needed frequent steadying A. She has very limited protective responses.  In sitting, pt needed steadying A as she was dressing. Improving RUE coordination. Pt spent second half of session in gym on mat working on dynamic reaching and weight shifting to strengthen trunk. Mod A needed to guide trunk into extension with reach. Dynamic sitting balance work with over head reaching with BUE and reaching for objects with RUE. Pt has excellent trunk rotation, but decreased head right reactions and trunk elongation with reaching which limit her balance control. Pt transferred back to chair and propelled self to room. A towel wedge placed under R hip for pelvic positioning along with a quick release belt for balance safety. Pt in room with all needs met.  Therapy Documentation Precautions:  Precautions Precautions: Fall Precaution Comments: Rt lateral lean,  double vision Restrictions Weight Bearing Restrictions: No       Pain: Pain Assessment Pain Assessment: No/denies pain ADL:  See FIM for current functional status  Therapy/Group: Individual Therapy  [place] 10/04/2014, 12:02 PM

## 2014-10-05 ENCOUNTER — Inpatient Hospital Stay (HOSPITAL_COMMUNITY): Payer: Commercial Managed Care - HMO | Admitting: Occupational Therapy

## 2014-10-05 ENCOUNTER — Inpatient Hospital Stay (HOSPITAL_COMMUNITY): Payer: Commercial Managed Care - HMO | Admitting: Speech Pathology

## 2014-10-05 ENCOUNTER — Inpatient Hospital Stay (HOSPITAL_COMMUNITY): Payer: Commercial Managed Care - HMO | Admitting: *Deleted

## 2014-10-05 DIAGNOSIS — E119 Type 2 diabetes mellitus without complications: Secondary | ICD-10-CM

## 2014-10-05 LAB — GLUCOSE, CAPILLARY
GLUCOSE-CAPILLARY: 144 mg/dL — AB (ref 70–99)
GLUCOSE-CAPILLARY: 184 mg/dL — AB (ref 70–99)
Glucose-Capillary: 173 mg/dL — ABNORMAL HIGH (ref 70–99)
Glucose-Capillary: 184 mg/dL — ABNORMAL HIGH (ref 70–99)

## 2014-10-05 MED ORDER — METFORMIN HCL 500 MG PO TABS
500.0000 mg | ORAL_TABLET | Freq: Every day | ORAL | Status: DC
  Administered 2014-10-05 – 2014-10-07 (×3): 500 mg via ORAL
  Filled 2014-10-05 (×4): qty 1

## 2014-10-05 NOTE — Progress Notes (Signed)
Physical Therapy Session Note  Patient Details  Name: Madison Todd MRN: [id number] Date of Birth: 1951/04/04  Today's Date: 10/05/2014 PT Individual Time:  13:50-14:50 (3min)    Short Term Goals: Week 1:  PT Short Term Goal 1 (Week 1): Pt will perform basic transfers with Min A to R/L PT Short Term Goal 2 (Week 1): Pt will propel WC x100' with S for activity tolerance PT Short Term Goal 3 (Week 1): Pt will ambulate 100' with Mod A of 1 and RW PT Short Term Goal 4 (Week 1): Pt will perform static standing tolerance x3 min with self-corrected midline orientation, min A PT Short Term Goal 5 (Week 1): Pt will perform 5 stairs with Mod A of 1, bil rails  Skilled Therapeutic Interventions/Progress Updates:  Pt reporting returned sensation to face and improved vision. Tx focused on functional mobility training, NMR, and gait training. Placed wedge in WC under R hip to promote resting midline trunk alignment.   Performed multiple squat-pivot transfers with Min>Max A depending on safe control of turn due to continued R leaning. Pt needs max cues to initiate movement with compensatory L lean.   Pt propelled WC x150' with S - 50% with UEs and 50% with LEs only (cushion removed) for increased NMR and coordination.     Instructed pt in NMR tasks progressing from static sitting>>dynamic standing with the following activities for increased midline orientation, L weight shifting, and decreased pushing to R. Pt performed best with tasks involving some internal feedback and a target, such as L wall, and biodex.  - Static sitting with wedge under R hip, trunk against L wall ([place] tasks x65min)  - reaching/placing clothes pins outside BOS to far R, returning to midline, still with wedge - Replaced pins without presence of wedge - lateral scooting along mat with L lead/trunk lean initiating - standing at mirror static stance x2 min, adding head turns and min weight shifts - step taps to 2" step with  total A needed for balance. When therapist moved from assisting on R to L side, pt strongly resisting L weight shifting assist past midline, feeling like she would fall on me.  - Biodex x10 min at settings: weight shifting and weight bearing. Pt had most difficulty with LUE removed from support to attain/sustain L weight shift.   Gait in hall without deivce, mirror for visual feedback x30,' pt challenged to maintain L hip and shoulder contact on wall, esp during R step. Pre-gait with lean on wall with L marching only>>improved gait with up to mod A needed for trunk support. Pt also responded well to cues for increasing L step length and maintaining step width.        Therapy Documentation Precautions:  Precautions Precautions: Fall Precaution Comments: Rt lateral lean, double vision Restrictions Weight Bearing Restrictions: No   Pain: none    See FIM for current functional status  Therapy/Group: Individual Therapy  Kennieth Rad, PT, DPT  10/05/2014, 2:14 PM

## 2014-10-05 NOTE — Progress Notes (Signed)
Speech Language Pathology Daily Session Note  Patient Details  Name: Madison Todd MRN: [id number] Date of Birth: May 04, 1951  Today's Date: 10/05/2014 SLP Individual Time: 0800-0900 SLP Individual Time Calculation (min): 60 min  Short Term Goals: Week 1: SLP Short Term Goal 1 (Week 1): Pt will tolerate presentations of her currently prescribed diet wtih no overt s/s of aspiration and supervision cues for use of swallowing precautions.  SLP Short Term Goal 2 (Week 1): Pt will tolerate trials of advanced consistencies with no overt s/s of aspiraiton and supervision cues for use of swallowing precautions.  SLP Short Term Goal 3 (Week 1): Pt will return demonstration of oral motor strengthening exercises targeting improved right sided strength, range of motion, and coordination for mastication of solid textures with supervision cues.  SLP Short Term Goal 4 (Week 1): Pt will return demonstration of pharyngeal strengthening exercises to improve swallowing function and to continue working towards diet advancement with supervision cues   Skilled Therapeutic Interventions:  Pt was seen for skilled ST targeting dysphagia goals.  Upon arrival, pt was seated upright in wheelchair, awake, alert, and agreeable to participate in Madison Todd.  Skilled observations were completed during presentations of pt's currently prescribed diet with strong, reflexive cough noted intermittently with mixed consistencies.  Per report with baseline of COPD, which SLP suspects could be contributing to coughing during meals as well.   Pt was supervision faded to mod I for use of swallowing precautions during meal, therefore, recommend that pt be upgraded to intermittent supervision during meals.  Pt did not recall pharyngeal strengthening exercises from therapy session >48 hours ago; therefore, provided skilled RE-education for rationale behind exercises and left pt with a handout to encourage her to complete exercises in between  therapy sessions.  Continue per current plan of care.    FIM:  Comprehension Comprehension Mode: Auditory Comprehension: 5-Understands complex 90% of the time/Cues < 10% of the time Expression Expression Mode: Verbal Expression: 5-Expresses basic needs/ideas: With extra time/assistive device Social Interaction Social Interaction: 6-Interacts appropriately with others with medication or extra time (anti-anxiety, antidepressant). Problem Solving Problem Solving: 6-Solves complex problems: With extra time Memory Memory: 6-More than reasonable amt of time FIM - Eating Eating Activity: 5: Supervision/cues  Pain    Therapy/Group: Individual Therapy  Madison Todd, Selinda Orion 10/05/2014, 10:59 AM

## 2014-10-05 NOTE — Progress Notes (Signed)
64 year old right handed female with history of leukocytoclastic vasculitis, diabetes mellitus and peripheral neuropathy, cardiomyopathy, COPD/tobacco abuse, hypertension and bilateral breast cancer with lumpectomy as well as chemoradiation. Presented 09/27/2014 with severe headache, blurred vision, right-sided weakness and difficulty in swallowing. Initial cranial CT scan negative. MRI of the brain with acute right lateral medullary infarct as well as suspected inferior right lateral cerebellar hemisphere involvement. MRA of the head with some artifactual signal loss in the mid V4 segment with a suspected underlying 1 cm 75% stenosis on the right. Echocardiogram with ejection fraction of 41% grade 1 diastolic dysfunction. Carotid Dopplers with no ICA stenosis. Patient did not receive TPA. Subjective/Complaints: No pain complaints, no cough or SOB R side face still numb  Review of Systems - Negative except swallowing and balance problems Objective: Vital Signs: Blood pressure 148/74, pulse 66, temperature 97.6 F (36.4 C), temperature source Oral, resp. rate 15, weight 68.539 kg (151 lb 1.6 oz), SpO2 98 %. No results found. Results for orders placed or performed during the hospital encounter of 10/01/14 (from the past 72 hour(s))  Glucose, capillary     Status: Abnormal   Collection Time: 10/02/14  6:45 AM  Result Value Ref Range   Glucose-Capillary 147 (H) 70 - 99 mg/dL  Glucose, capillary     Status: Abnormal   Collection Time: 10/02/14 11:11 AM  Result Value Ref Range   Glucose-Capillary 162 (H) 70 - 99 mg/dL   Comment 1 Notify RN   Glucose, capillary     Status: Abnormal   Collection Time: 10/02/14  4:32 PM  Result Value Ref Range   Glucose-Capillary 103 (H) 70 - 99 mg/dL  Glucose, capillary     Status: Abnormal   Collection Time: 10/02/14  9:37 PM  Result Value Ref Range   Glucose-Capillary 183 (H) 70 - 99 mg/dL  Glucose, capillary     Status: Abnormal   Collection Time: 10/03/14   6:42 AM  Result Value Ref Range   Glucose-Capillary 159 (H) 70 - 99 mg/dL  Glucose, capillary     Status: Abnormal   Collection Time: 10/03/14 11:34 AM  Result Value Ref Range   Glucose-Capillary 165 (H) 70 - 99 mg/dL  Glucose, capillary     Status: Abnormal   Collection Time: 10/03/14  4:34 PM  Result Value Ref Range   Glucose-Capillary 120 (H) 70 - 99 mg/dL  Glucose, capillary     Status: Abnormal   Collection Time: 10/03/14  8:40 PM  Result Value Ref Range   Glucose-Capillary 204 (H) 70 - 99 mg/dL  Glucose, capillary     Status: Abnormal   Collection Time: 10/04/14  6:55 AM  Result Value Ref Range   Glucose-Capillary 146 (H) 70 - 99 mg/dL  Glucose, capillary     Status: Abnormal   Collection Time: 10/04/14 11:36 AM  Result Value Ref Range   Glucose-Capillary 180 (H) 70 - 99 mg/dL  Glucose, capillary     Status: Abnormal   Collection Time: 10/04/14  4:47 PM  Result Value Ref Range   Glucose-Capillary 135 (H) 70 - 99 mg/dL  Glucose, capillary     Status: Abnormal   Collection Time: 10/04/14  9:44 PM  Result Value Ref Range   Glucose-Capillary 228 (H) 70 - 99 mg/dL     HEENT: normal Cardio: RRR and no murmur Resp: CTA B/L and difficulty clearing secretions GI: BS positive and NT, ND Extremity:  No Edema Skin:   Intact Neuro: Alert/Oriented, Cranial Nerve Abnormalities R  V1,2,3 sensory, Abnormal Sensory reduced in LUE and LLE, Abnormal FMC Ataxic/ dec FMC and Other Right lateral pulsion during sitting Musc/Skel:  Normal Gen NAD, voice quality is hoarse   Assessment/Plan: 1. 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 Physiatrist is providing close team supervision and 24 hour management of active medical problems listed below. Physiatrist and rehab team continue to assess barriers to discharge/monitor patient progress  toward functional and medical goals. FIM: FIM - Bathing Bathing Steps Patient Completed: Chest, Right Arm, Left Arm, Abdomen, Right upper leg, Left upper leg, Front perineal area, Buttocks, Right lower leg (including foot), Left lower leg (including foot) Bathing: 4: Steadying assist  FIM - Upper Body Dressing/Undressing Upper body dressing/undressing steps patient completed: Thread/unthread right sleeve of front closure shirt/dress, Button/unbutton shirt, Thread/unthread left sleeve of front closure shirt/dress, Pull shirt around back of front closure shirt/dress, Thread/unthread right sleeve of pullover shirt/dresss, Thread/unthread left sleeve of pullover shirt/dress, Put head through opening of pull over shirt/dress, Pull shirt over trunk Upper body dressing/undressing: 4: Steadying assist FIM - Lower Body Dressing/Undressing Lower body dressing/undressing steps patient completed: Thread/unthread right pants leg, Thread/unthread left pants leg, Pull pants up/down, Don/Doff right sock, Don/Doff left sock, Thread/unthread right underwear leg, Thread/unthread left underwear leg, Pull underwear up/down, Don/Doff right shoe, Don/Doff left shoe, Fasten/unfasten right shoe, Fasten/unfasten left shoe Lower body dressing/undressing: 4: Steadying Assist  FIM - Toileting Toileting steps completed by patient: Adjust clothing prior to toileting, Performs perineal hygiene, Adjust clothing after toileting Toileting: 3: Mod-Patient completed 2 of 3 steps  FIM - Radio producer Devices: Grab bars Toilet Transfers: 0-Activity did not occur  FIM - Control and instrumentation engineer Devices: Arm rests Bed/Chair Transfer: 3: Chair or W/C > Bed: Mod A (lift or lower assist), 4: Sit > Supine: Min A (steadying pt. > 75%/lift 1 leg)  FIM - Locomotion: Wheelchair Distance: 55 Locomotion: Wheelchair: 2: Travels 50 - 149 ft with supervision, cueing or coaxing FIM -  Locomotion: Ambulation Locomotion: Ambulation Assistive Devices: Nutritional therapist Ambulation/Gait Assistance: 2: Max assist Locomotion: Ambulation: 1: Travels less than 50 ft with total assistance/helper does all (Pt.<25%)  Comprehension Comprehension Mode: Auditory Comprehension: 5-Understands complex 90% of the time/Cues < 10% of the time  Expression Expression Mode: Verbal Expression: 5-Expresses basic needs/ideas: With extra time/assistive device  Social Interaction Social Interaction: 6-Interacts appropriately with others with medication or extra time (anti-anxiety, antidepressant).  Problem Solving Problem Solving: 6-Solves complex problems: With extra time  Memory Memory: 6-More than reasonable amt of time  Medical Problem List and Plan: 1. 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 DVT Prophylaxis/Anticoagulation: SQ Lovenox .Monitor platelet and any signs of bleeding 3. Pain Management: Tylenol 4. Dysphagia. Pured honey thick liquids. Follow-up speech therapy. IV fluids for hydration 5. Neuropsych: This patient is capable of making decisions on her own behalf. 6. Skin/Wound Care: Routine skin checks 7. Fluids/Electrolytes/Nutrition: Strict I & O.Follow up labs 8.Hypertension. No present antihypertensive medication. Patient on Toprol 50 mg daily prior to admission as well as Tribenzor 40-5-12.5 mg daily.resume as tolerated.Monitor with increased mobility 9.diabetes mellitus with peripheral neuropathy. Patient with hemoglobin A1c 8.9. Sliding scale insulin. Check blood sugars before meals and at bedtime. Patient on Glucophage 500 mg daily prior to admission, creat .65 will resume 10.Hyperlipidemia.lipitor 11.COPD/tobacco abuse.Counseling 12.HX Bilat. breast cancer.Follow up oncology out  patient 13.  Hypokalemia- may be IV related now on po K LOS (Days) 4 A FACE TO FACE EVALUATION WAS PERFORMED  Madison Todd  E 10/05/2014, 5:46 AM

## 2014-10-05 NOTE — Progress Notes (Signed)
Occupational Therapy Session Note  Patient Details  Name: Madison Todd MRN: [id number] Date of Birth: 17-Jan-1951  Today's Date: 10/05/2014 OT Individual Time: 0900-1000 OT Individual Time Calculation (min): 60 min    Short Term Goals: Week 1:  OT Short Term Goal 1 (Week 1): Pt will complete toilet transfer with min assist OT Short Term Goal 2 (Week 1): Pt will complete bathing with min assist for standing balance OT Short Term Goal 3 (Week 1): Pt will complete UB dressing with min assist OT Short Term Goal 4 (Week 1): Pt will complete 2 grooming tasks in standing with min assist for standing balance  Skilled Therapeutic Interventions/Progress Updates:    Pt seen for BADL retraining with a focus on activity tolerance and standing balance with midline awareness.  Pt stated she continues to feel very tired, but she is able to work very well in therapy with minimal breaks.  Pt continues to need min to mod A in standing at the sink to correct her balance as she drifts to R and is not always aware of it. She had improved self correction of her balance with cuing. Her diplopia is also improving which is helping her find her center. Once self care was completed, pt went to gym to work on trunk control and balance exercise sitting on a balance disc with and without UE support. Dynamic reaching and weight shifting to improve trunk control.  Pt transferred back to w/c with min A.  Pt taken back to room with all needs met.  Therapy Documentation Precautions:  Precautions Precautions: Fall Precaution Comments: Rt lateral lean, double vision Restrictions Weight Bearing Restrictions: No     Pain: no c/o pain    ADL: See FIM for current functional status  Therapy/Group: Individual Therapy  [place] 10/05/2014, 12:12 PM

## 2014-10-06 ENCOUNTER — Inpatient Hospital Stay (HOSPITAL_COMMUNITY): Payer: Commercial Managed Care - HMO | Admitting: Occupational Therapy

## 2014-10-06 ENCOUNTER — Inpatient Hospital Stay (HOSPITAL_COMMUNITY): Payer: Commercial Managed Care - HMO | Admitting: Speech Pathology

## 2014-10-06 ENCOUNTER — Inpatient Hospital Stay (HOSPITAL_COMMUNITY): Payer: Commercial Managed Care - HMO | Admitting: *Deleted

## 2014-10-06 ENCOUNTER — Inpatient Hospital Stay (HOSPITAL_COMMUNITY): Payer: Commercial Managed Care - HMO | Admitting: Physical Therapy

## 2014-10-06 LAB — GLUCOSE, CAPILLARY
Glucose-Capillary: 156 mg/dL — ABNORMAL HIGH (ref 70–99)
Glucose-Capillary: 210 mg/dL — ABNORMAL HIGH (ref 70–99)
Glucose-Capillary: 143 mg/dL — ABNORMAL HIGH (ref 70–99)
Glucose-Capillary: 211 mg/dL — ABNORMAL HIGH (ref 70–99)

## 2014-10-06 MED ORDER — METOPROLOL SUCCINATE ER 25 MG PO TB24
25.0000 mg | ORAL_TABLET | Freq: Every day | ORAL | Status: DC
  Administered 2014-10-06 – 2014-10-12 (×7): 25 mg via ORAL
  Filled 2014-10-06 (×9): qty 1

## 2014-10-06 MED ORDER — SODIUM CHLORIDE 0.45 % IV SOLN
INTRAVENOUS | Status: DC
  Administered 2014-10-06 – 2014-10-09 (×4): via INTRAVENOUS

## 2014-10-06 NOTE — Progress Notes (Signed)
Occupational Therapy Session Note  Patient Details  Name: Madison Todd MRN: [id number] Date of Birth: 1951-07-24  Today's Date: 10/06/2014 OT Individual Time: 1430-1530 OT Individual Time Calculation (min): 60 min    Short Term Goals: Week 1:  OT Short Term Goal 1 (Week 1): Pt will complete toilet transfer with min assist OT Short Term Goal 2 (Week 1): Pt will complete bathing with min assist for standing balance OT Short Term Goal 3 (Week 1): Pt will complete UB dressing with min assist OT Short Term Goal 4 (Week 1): Pt will complete 2 grooming tasks in standing with min assist for standing balance  Skilled Therapeutic Interventions/Progress Updates:  Upon entering the room, pt supine in bed sleeping with no c/o pain during the session. Pt reporting fatigue from previous therapy sessions but is agreeable to B & D session. Pt performed supine >sit with supervision. Stand pivot from bed <> wheelchair with Min A. Pt propelled wheelchair to bathroom for toilet transfer. She set wheelchair up and locked brakes with therapist performing Max A stand pivot transfer to and from toilet secondary to pt requiring assist to lift and lower self. Pt transferred onto TTB from wheelchair in same manner. Pt required steady assist on TTB secondary to R lean for safety. Dressing performed from wheelchair STS with steady assist for balance. Pt returning to bed. Therapist educated pt on energy conservation techniques for self care tasks secondary to reported increased fatigue this session. Pt supine in bed with bed alarm on and call bell within reach upon exiting the room.   Therapy Documentation Precautions:  Precautions Precautions: Fall Precaution Comments: Rt lateral lean, double vision Restrictions Weight Bearing Restrictions: No Pain: Pain Assessment Pain Assessment: No/denies pain  See FIM for current functional status  Therapy/Group: Individual Therapy  Phineas Semen 10/06/2014, 7:20  PM

## 2014-10-06 NOTE — Progress Notes (Signed)
Physical Therapy Session Note  Patient Details  Name: Madison Todd MRN: [id number] Date of Birth: May 27, 1951  Today's Date: 10/06/2014 PT Individual Time: 1100-1200 PT Individual Time Calculation (min): 60 min   Short Term Goals: Week 1:  PT Short Term Goal 1 (Week 1): Pt will perform basic transfers with Min A to R/L PT Short Term Goal 2 (Week 1): Pt will propel WC x100' with S for activity tolerance PT Short Term Goal 3 (Week 1): Pt will ambulate 100' with Mod A of 1 and RW PT Short Term Goal 4 (Week 1): Pt will perform static standing tolerance x3 min with self-corrected midline orientation, min A PT Short Term Goal 5 (Week 1): Pt will perform 5 stairs with Mod A of 1, bil rails  Skilled Therapeutic Interventions/Progress Updates:  Pt was seen bedside in the am. Pt propelled w/c to gym with B UEs and S. In gym, treatment focused on NMR in parallel bars. Pt performed multiple sit to stand transfers with min guard to min A. While standing focused on midline orientation, weight shifting, unilateral stance, reaching to encourage L lateral lean. Pt ambulated with raill in hallway, 30 feet x 2. Pt required mod A for first walk and max A for second walk secondary to fatigue. Pt returned to room and left sitting up in w/c.   Therapy Documentation Precautions:  Precautions Precautions: Fall Precaution Comments: Rt lateral lean, double vision Restrictions Weight Bearing Restrictions: No General:   Pain: No c/o pain.    Locomotion : Ambulation Ambulation/Gait Assistance: 3: Mod assist;2: Max assist   See FIM for current functional status  Therapy/Group: Individual Therapy  Dub Amis 10/06/2014, 12:42 PM

## 2014-10-06 NOTE — Progress Notes (Signed)
Speech Language Pathology Daily Session Note  Patient Details  Name: Madison Todd MRN: [id number] Date of Birth: 14-May-1951  Today's Date: 10/06/2014 SLP Individual Time: 0900-1000 SLP Individual Time Calculation (min): 60 min  Short Term Goals: Week 1: SLP Short Term Goal 1 (Week 1): Pt will tolerate presentations of her currently prescribed diet wtih no overt s/s of aspiration and supervision cues for use of swallowing precautions.  SLP Short Term Goal 2 (Week 1): Pt will tolerate trials of advanced consistencies with no overt s/s of aspiraiton and supervision cues for use of swallowing precautions.  SLP Short Term Goal 3 (Week 1): Pt will return demonstration of oral motor strengthening exercises targeting improved right sided strength, range of motion, and coordination for mastication of solid textures with supervision cues.  SLP Short Term Goal 4 (Week 1): Pt will return demonstration of pharyngeal strengthening exercises to improve swallowing function and to continue working towards diet advancement with supervision cues   Skilled Therapeutic Interventions:  Pt was seen for skilled ST targeting dysphagia goals. Upon arrival, pt was reclined in bed, awake, alert, and agreeable to participate in ST.  SLP facilitated the session with trials of regular water via cup sips to continue working towards liquids advancement.  Pt with improvements in clinical s/s of aspiration in comparison to initial eval, with pt exhibiting soft cough and/or throat clear intermittently following cup sips.  SLP also introduced oral motor exercises to improve right sided labial, lingual and buccal weakness for mastication of advanced consistencies.  Pt returned demonstration of the abovementioned exercises with supervision cues.  SLP provided pt with a handout and encouraged pt to complete 10 repetitions of each exercise 2-3x/day.  Pt also recalled 2 pharyngeal strengthening exercises with supervision question  cues.  Continue per current plan of care.      FIM:  Comprehension Comprehension Mode: Auditory Comprehension: 5-Understands complex 90% of the time/Cues < 10% of the time Expression Expression Mode: Verbal Expression: 5-Expresses complex 90% of the time/cues < 10% of the time Social Interaction Social Interaction: 6-Interacts appropriately with others with medication or extra time (anti-anxiety, antidepressant). Problem Solving Problem Solving: 6-Solves complex problems: With extra time Memory Memory: 6-More than reasonable amt of time FIM - Eating Eating Activity: 5: Supervision/cues  Pain Pain Assessment Pain Assessment: No/denies pain  Therapy/Group: Individual Therapy  Spike Desilets, Selinda Orion 10/06/2014, 6:33 PM

## 2014-10-06 NOTE — Progress Notes (Signed)
64 year old right handed female with history of leukocytoclastic vasculitis, diabetes mellitus and peripheral neuropathy, cardiomyopathy, COPD/tobacco abuse, hypertension and bilateral breast cancer with lumpectomy as well as chemoradiation. Presented 09/27/2014 with severe headache, blurred vision, right-sided weakness and difficulty in swallowing. Initial cranial CT scan negative. MRI of the brain with acute right lateral medullary infarct as well as suspected inferior right lateral cerebellar hemisphere involvement. MRA of the head with some artifactual signal loss in the mid V4 segment with a suspected underlying 1 cm 75% stenosis on the right. Echocardiogram with ejection fraction of 16% grade 1 diastolic dysfunction. Carotid Dopplers with no ICA stenosis. Patient did not receive TPA.  Subjective/Complaints: Uneventful night. Has questions about IVF and whether she still needs it a night   Review of Systems - Negative except swallowing and balance problems Objective: Vital Signs: Blood pressure 165/90, pulse 66, temperature 98.4 F (36.9 C), temperature source Oral, resp. rate 18, weight 68.539 kg (151 lb 1.6 oz), SpO2 95 %. No results found. Results for orders placed or performed during the hospital encounter of 10/01/14 (from the past 72 hour(s))  Glucose, capillary     Status: Abnormal   Collection Time: 10/03/14 11:34 AM  Result Value Ref Range   Glucose-Capillary 165 (H) 70 - 99 mg/dL  Glucose, capillary     Status: Abnormal   Collection Time: 10/03/14  4:34 PM  Result Value Ref Range   Glucose-Capillary 120 (H) 70 - 99 mg/dL  Glucose, capillary     Status: Abnormal   Collection Time: 10/03/14  8:40 PM  Result Value Ref Range   Glucose-Capillary 204 (H) 70 - 99 mg/dL  Glucose, capillary     Status: Abnormal   Collection Time: 10/04/14  6:55 AM  Result Value Ref Range   Glucose-Capillary 146 (H) 70 - 99 mg/dL  Glucose, capillary     Status: Abnormal   Collection Time: 10/04/14  11:36 AM  Result Value Ref Range   Glucose-Capillary 180 (H) 70 - 99 mg/dL  Glucose, capillary     Status: Abnormal   Collection Time: 10/04/14  4:47 PM  Result Value Ref Range   Glucose-Capillary 135 (H) 70 - 99 mg/dL  Glucose, capillary     Status: Abnormal   Collection Time: 10/04/14  9:44 PM  Result Value Ref Range   Glucose-Capillary 228 (H) 70 - 99 mg/dL  Glucose, capillary     Status: Abnormal   Collection Time: 10/05/14  6:42 AM  Result Value Ref Range   Glucose-Capillary 144 (H) 70 - 99 mg/dL  Glucose, capillary     Status: Abnormal   Collection Time: 10/05/14 11:30 AM  Result Value Ref Range   Glucose-Capillary 184 (H) 70 - 99 mg/dL   Comment 1 Notify RN   Glucose, capillary     Status: Abnormal   Collection Time: 10/05/14  4:28 PM  Result Value Ref Range   Glucose-Capillary 173 (H) 70 - 99 mg/dL  Glucose, capillary     Status: Abnormal   Collection Time: 10/05/14  8:51 PM  Result Value Ref Range   Glucose-Capillary 184 (H) 70 - 99 mg/dL  Glucose, capillary     Status: Abnormal   Collection Time: 10/06/14  6:40 AM  Result Value Ref Range   Glucose-Capillary 156 (H) 70 - 99 mg/dL     HEENT: normal Cardio: RRR and no murmur Resp: CTA B/L and difficulty clearing secretions GI: BS positive and NT, ND Extremity:  No Edema Skin:   Intact Neuro: Alert/Oriented,  Cranial Nerve Abnormalities R V1,2,3 sensory, Abnormal Sensory reduced in LUE and LLE, Abnormal FMC Ataxic/ dec FMC and Other Right lateral pulsion during sitting Musc/Skel:  Normal Gen NAD, voice quality is hoarse   Assessment/Plan: 1. 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 Physiatrist is providing close team supervision and 24 hour management of active medical problems listed below. Physiatrist and rehab team continue to assess barriers to  discharge/monitor patient progress toward functional and medical goals. FIM: FIM - Bathing Bathing Steps Patient Completed: Chest, Right Arm, Left Arm, Abdomen, Right upper leg, Left upper leg, Front perineal area, Buttocks, Right lower leg (including foot), Left lower leg (including foot) Bathing: 4: Steadying assist  FIM - Upper Body Dressing/Undressing Upper body dressing/undressing steps patient completed: Thread/unthread right sleeve of pullover shirt/dresss, Thread/unthread left sleeve of pullover shirt/dress, Put head through opening of pull over shirt/dress, Pull shirt over trunk Upper body dressing/undressing: 5: Supervision: Safety issues/verbal cues FIM - Lower Body Dressing/Undressing Lower body dressing/undressing steps patient completed: Thread/unthread right pants leg, Thread/unthread left pants leg, Pull pants up/down, Don/Doff right sock, Don/Doff left sock, Thread/unthread right underwear leg, Thread/unthread left underwear leg, Pull underwear up/down, Don/Doff right shoe, Don/Doff left shoe, Fasten/unfasten right shoe, Fasten/unfasten left shoe Lower body dressing/undressing: 4: Steadying Assist  FIM - Toileting Toileting steps completed by patient: Adjust clothing prior to toileting, Performs perineal hygiene, Adjust clothing after toileting Toileting: 3: Mod-Patient completed 2 of 3 steps  FIM - Radio producer Devices: Grab bars Toilet Transfers: 0-Activity did not occur  FIM - Control and instrumentation engineer Devices: Arm rests Bed/Chair Transfer: 1: Two helpers (Simultaneous filing. User may not have seen previous data.)  FIM - Locomotion: Wheelchair Distance: 150 Locomotion: Wheelchair: 5: Travels 150 ft or more: maneuvers on rugs and over door sills with supervision, cueing or coaxing FIM - Locomotion: Ambulation Locomotion: Ambulation Assistive Devices: Other (comment) (L hall rail) Ambulation/Gait Assistance: 3: Mod  assist Locomotion: Ambulation: 1: Travels less than 50 ft with moderate assistance (Pt: 50 - 74%)  Comprehension Comprehension Mode: Auditory Comprehension: 5-Understands complex 90% of the time/Cues < 10% of the time  Expression Expression Mode: Verbal Expression: 5-Expresses complex 90% of the time/cues < 10% of the time  Social Interaction Social Interaction: 6-Interacts appropriately with others with medication or extra time (anti-anxiety, antidepressant).  Problem Solving Problem Solving: 6-Solves complex problems: With extra time  Memory Memory: 6-More than reasonable amt of time  Medical Problem List and Plan: 1. 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 DVT Prophylaxis/Anticoagulation: SQ Lovenox .Monitor platelet and any signs of bleeding 3. Pain Management: Tylenol 4. Dysphagia. Pured honey thick liquids. Follow-up speech therapy. IV fluids for hydration---resume and continue low rate IVF HS until liquids advanced 5. Neuropsych: This patient is capable of making decisions on her own behalf. 6. Skin/Wound Care: Routine skin checks 7. Fluids/Electrolytes/Nutrition: Strict I & O.Follow up labs 8.Hypertension. No present antihypertensive medication. Patient on Toprol 50 mg daily prior to admission as well as Tribenzor 40-5-12.5 mg daily.resume as tolerated.  -resume toprol at 25mg  9.diabetes mellitus with peripheral neuropathy. Patient with hemoglobin A1c 8.9. Sliding scale insulin. Check blood sugars before meals and at bedtime. Patient on Glucophage 500 mg daily as per home regimen--  -diabetic education   10.Hyperlipidemia.lipitor 11.COPD/tobacco abuse.Counseling 12.HX Bilat. breast cancer.Follow up oncology out patient 13.  Hypokalemia- may be IV related now on po K   LOS (Days) 5 A FACE TO FACE EVALUATION WAS PERFORMED  Izzy Courville T 10/06/2014, 10:09 AM

## 2014-10-07 ENCOUNTER — Inpatient Hospital Stay (HOSPITAL_COMMUNITY): Payer: Commercial Managed Care - HMO | Admitting: Physical Therapy

## 2014-10-07 ENCOUNTER — Encounter (HOSPITAL_COMMUNITY): Payer: Commercial Managed Care - HMO

## 2014-10-07 LAB — GLUCOSE, CAPILLARY
GLUCOSE-CAPILLARY: 133 mg/dL — AB (ref 70–99)
GLUCOSE-CAPILLARY: 149 mg/dL — AB (ref 70–99)
GLUCOSE-CAPILLARY: 161 mg/dL — AB (ref 70–99)
Glucose-Capillary: 205 mg/dL — ABNORMAL HIGH (ref 70–99)

## 2014-10-07 MED ORDER — METFORMIN HCL 500 MG PO TABS
500.0000 mg | ORAL_TABLET | Freq: Two times a day (BID) | ORAL | Status: DC
  Administered 2014-10-07 – 2014-10-15 (×17): 500 mg via ORAL
  Filled 2014-10-07 (×20): qty 1

## 2014-10-07 NOTE — Progress Notes (Signed)
64 year old right handed female with history of leukocytoclastic vasculitis, diabetes mellitus and peripheral neuropathy, cardiomyopathy, COPD/tobacco abuse, hypertension and bilateral breast cancer with lumpectomy as well as chemoradiation. Presented 09/27/2014 with severe headache, blurred vision, right-sided weakness and difficulty in swallowing. Initial cranial CT scan negative. MRI of the brain with acute right lateral medullary infarct as well as suspected inferior right lateral cerebellar hemisphere involvement. MRA of the head with some artifactual signal loss in the mid V4 segment with a suspected underlying 1 cm 75% stenosis on the right. Echocardiogram with ejection fraction of 27% grade 1 diastolic dysfunction. Carotid Dopplers with no ICA stenosis. Patient did not receive TPA.  Subjective/Complaints: No complaints. Feels well. Appetite reasonable   Review of Systems - Negative except swallowing and balance problems Objective: Vital Signs: Blood pressure 157/65, pulse 61, temperature 97.9 F (36.6 C), temperature source Oral, resp. rate 18, weight 68.539 kg (151 lb 1.6 oz), SpO2 97 %. No results found. Results for orders placed or performed during the hospital encounter of 10/01/14 (from the past 72 hour(s))  Glucose, capillary     Status: Abnormal   Collection Time: 10/04/14 11:36 AM  Result Value Ref Range   Glucose-Capillary 180 (H) 70 - 99 mg/dL  Glucose, capillary     Status: Abnormal   Collection Time: 10/04/14  4:47 PM  Result Value Ref Range   Glucose-Capillary 135 (H) 70 - 99 mg/dL  Glucose, capillary     Status: Abnormal   Collection Time: 10/04/14  9:44 PM  Result Value Ref Range   Glucose-Capillary 228 (H) 70 - 99 mg/dL  Glucose, capillary     Status: Abnormal   Collection Time: 10/05/14  6:42 AM  Result Value Ref Range   Glucose-Capillary 144 (H) 70 - 99 mg/dL  Glucose, capillary     Status: Abnormal   Collection Time: 10/05/14 11:30 AM  Result Value Ref Range    Glucose-Capillary 184 (H) 70 - 99 mg/dL   Comment 1 Notify RN   Glucose, capillary     Status: Abnormal   Collection Time: 10/05/14  4:28 PM  Result Value Ref Range   Glucose-Capillary 173 (H) 70 - 99 mg/dL  Glucose, capillary     Status: Abnormal   Collection Time: 10/05/14  8:51 PM  Result Value Ref Range   Glucose-Capillary 184 (H) 70 - 99 mg/dL  Glucose, capillary     Status: Abnormal   Collection Time: 10/06/14  6:40 AM  Result Value Ref Range   Glucose-Capillary 156 (H) 70 - 99 mg/dL  Glucose, capillary     Status: Abnormal   Collection Time: 10/06/14 11:06 AM  Result Value Ref Range   Glucose-Capillary 210 (H) 70 - 99 mg/dL  Glucose, capillary     Status: Abnormal   Collection Time: 10/06/14  5:20 PM  Result Value Ref Range   Glucose-Capillary 143 (H) 70 - 99 mg/dL  Glucose, capillary     Status: Abnormal   Collection Time: 10/06/14  9:52 PM  Result Value Ref Range   Glucose-Capillary 211 (H) 70 - 99 mg/dL  Glucose, capillary     Status: Abnormal   Collection Time: 10/07/14  6:42 AM  Result Value Ref Range   Glucose-Capillary 133 (H) 70 - 99 mg/dL     HEENT: normal Cardio: RRR and no murmur Resp: CTA B/L and difficulty clearing secretions GI: BS positive and NT, ND Extremity:  No Edema Skin:   Intact Neuro: Alert/Oriented, Cranial Nerve Abnormalities R V1,2,3 sensory, Abnormal Sensory  reduced in LUE and LLE, Abnormal FMC Ataxic/ dec FMC and Other Right lateral pulsion during sitting Musc/Skel:  Normal Gen NAD, voice quality hoarse   Assessment/Plan: 1. 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 Physiatrist is providing close team supervision and 24 hour management of active medical problems listed below. Physiatrist and rehab team continue to assess barriers to discharge/monitor patient progress toward functional  and medical goals. FIM: FIM - Bathing Bathing Steps Patient Completed: Chest, Right Arm, Left Arm, Abdomen, Front perineal area, Buttocks, Right upper leg, Left upper leg, Right lower leg (including foot), Left lower leg (including foot) Bathing: 4: Steadying assist  FIM - Upper Body Dressing/Undressing Upper body dressing/undressing steps patient completed: Thread/unthread right sleeve of front closure shirt/dress, Thread/unthread left sleeve of front closure shirt/dress, Pull shirt around back of front closure shirt/dress, Button/unbutton shirt Upper body dressing/undressing: 5: Supervision: Safety issues/verbal cues FIM - Lower Body Dressing/Undressing Lower body dressing/undressing steps patient completed: Thread/unthread right pants leg, Thread/unthread left pants leg, Pull pants up/down, Don/Doff right sock, Don/Doff left sock Lower body dressing/undressing: 4: Steadying Assist  FIM - Toileting Toileting steps completed by patient: Adjust clothing prior to toileting, Performs perineal hygiene, Adjust clothing after toileting Toileting Assistive Devices: Grab bar or rail for support Toileting: 4: Steadying assist  FIM - Radio producer Devices: Grab bars Toilet Transfers: 2-To toilet/BSC: Max A (lift and lower assist), 2-From toilet/BSC: Max A (lift and lower assist)  FIM - Control and instrumentation engineer Devices: Arm rests Bed/Chair Transfer: 1: Two helpers (Simultaneous filing. User may not have seen previous data.)  FIM - Locomotion: Wheelchair Distance: 150 Locomotion: Wheelchair: 5: Travels 150 ft or more: maneuvers on rugs and over door sills with supervision, cueing or coaxing FIM - Locomotion: Ambulation Locomotion: Ambulation Assistive Devices: Other (comment) (hand rail in hallway) Ambulation/Gait Assistance: 3: Mod assist, 2: Max assist Locomotion: Ambulation: 1: Travels less than 50 ft with maximal assistance (Pt: 25 -  49%)  Comprehension Comprehension Mode: Auditory Comprehension: 5-Understands complex 90% of the time/Cues < 10% of the time  Expression Expression Mode: Verbal Expression: 5-Expresses complex 90% of the time/cues < 10% of the time  Social Interaction Social Interaction: 6-Interacts appropriately with others with medication or extra time (anti-anxiety, antidepressant).  Problem Solving Problem Solving: 6-Solves complex problems: With extra time  Memory Memory: 6-More than reasonable amt of time  Medical Problem List and Plan: 1. 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 DVT Prophylaxis/Anticoagulation: SQ Lovenox .Monitor platelet and any signs of bleeding 3. Pain Management: Tylenol 4. Dysphagia. Pured honey thick liquids. Follow-up speech therapy.  continue low rate IVF HS until liquids advanced 5. Neuropsych: This patient is capable of making decisions on her own behalf. 6. Skin/Wound Care: Routine skin checks 7. Fluids/Electrolytes/Nutrition: Strict I & O.Follow up labs 8.Hypertension. No present antihypertensive medication. Patient on Toprol 50 mg daily prior to admission as well as Tribenzor 40-5-12.5 mg daily.resume as tolerated.  -resumed toprol at 25mg  daily 9.diabetes mellitus with peripheral neuropathy. Patient with hemoglobin A1c 8.9. Sliding scale insulin. Check blood sugars before meals and at bedtime. Patient on Glucophage 500 mg daily as per home regimen--  -diabetic education  -some improvement with glucophage----add pm dose 10.Hyperlipidemia.lipitor 11.COPD/tobacco abuse.Counseling 12.HX Bilat. breast cancer.Follow up oncology out patient 13.  Hypokalemia- may be IV related now on po K  LOS (Days) 6 A FACE TO FACE EVALUATION WAS PERFORMED  Madison Todd 10/07/2014, 8:19 AM

## 2014-10-07 NOTE — Progress Notes (Signed)
Occupational Therapy Session Note  Patient Details  Name: Madison Todd MRN: [id number] Date of Birth: 03/07/1951  Today's Date: 10/07/2014 OT Individual Time: 0930-1015 OT Individual Time Calculation (min): 45 min    Short Term Goals: Week 1:  OT Short Term Goal 1 (Week 1): Pt will complete toilet transfer with min assist OT Short Term Goal 2 (Week 1): Pt will complete bathing with min assist for standing balance OT Short Term Goal 3 (Week 1): Pt will complete UB dressing with min assist OT Short Term Goal 4 (Week 1): Pt will complete 2 grooming tasks in standing with min assist for standing balance  Skilled Therapeutic Interventions/Progress Updates:    Pt seen for 1:1 OT session with focus on functional transfers, postural control in sitting and standing, trunk NMR, and safety awareness. Pt received supine in bed declining bathing this AM. Completed supine>sit at supervision level then completed dressing sitting EOB with min-SBA for balance due to R lateral lean. Positioned items on L side to facilitate weight shift. Pt completed stand pivot transfer bed<>BSC with min A and required min A standing balance during clothing management. Engaged in dynamic sitting balance activity in gym with emphasis on trunk rotation and weight shifting to L. Began task sitting EOM with feet supported then progressed to feet unsupported while reaching to L and anteriorly to place clothes pens. Pt required min-SBA for sitting balance during activity. Pt removed clothes pens in standing with min-mod A. Pt returned to room and left sitting in w/c with all needs in reach.   Therapy Documentation Precautions:  Precautions Precautions: Fall Precaution Comments: Rt lateral lean, double vision Restrictions Weight Bearing Restrictions: No General:   Vital Signs:  Pain: No report of pain  See FIM for current functional status  Therapy/Group: Individual Therapy  Duayne Cal 10/07/2014, 12:25  PM

## 2014-10-07 NOTE — Progress Notes (Signed)
Physical Therapy Session Note  Patient Details  Name: Madison Todd MRN: [id number] Date of Birth: [birth year]  Today's Date: 10/07/2014 PT Individual Time: 1330-1430 PT Individual Time Calculation (min): 60 min   Short Term Goals: Week 1:  PT Short Term Goal 1 (Week 1): Pt will perform basic transfers with Min A to R/L PT Short Term Goal 2 (Week 1): Pt will propel WC x100' with S for activity tolerance PT Short Term Goal 3 (Week 1): Pt will ambulate 100' with Mod A of 1 and RW PT Short Term Goal 4 (Week 1): Pt will perform static standing tolerance x3 min with self-corrected midline orientation, min A PT Short Term Goal 5 (Week 1): Pt will perform 5 stairs with Mod A of 1, bil rails  Skilled Therapeutic Interventions/Progress Updates:  Pt was seen bedside in the pm. Pt c/o fatigue but willing to participate with therapy. Pt propelled w/c to gym with B UEs and S. Treatment in gym focused on NMR with hemi walker including midline orientation, weight shifting, unilateral stance as well as pre gait activities. Pt performed multiple sit to stand transfers with hemiwalker and min to mod A with verbal cues. Pt ambulated 25 feet x 4 with hemiwalker and mod to max A with multiple verbal and tactile cues required. Pt propelled w/c back to room with B UEs and S. Pt transferred w/c to edge of bed with min A. Pt transferred edge of bed to supine with S.   Therapy Documentation Precautions:  Precautions Precautions: Fall Precaution Comments: Rt lateral lean, double vision Restrictions Weight Bearing Restrictions: No General:    Pain: No c/o pain.    Locomotion : Ambulation Ambulation/Gait Assistance: 3: Mod assist;2: Max assist   See FIM for current functional status  Therapy/Group: Individual Therapy  Dub Amis 10/07/2014, 3:13 PM

## 2014-10-08 ENCOUNTER — Inpatient Hospital Stay (HOSPITAL_COMMUNITY): Payer: Commercial Managed Care - HMO

## 2014-10-08 ENCOUNTER — Inpatient Hospital Stay (HOSPITAL_COMMUNITY): Payer: Commercial Managed Care - HMO | Admitting: Occupational Therapy

## 2014-10-08 DIAGNOSIS — E119 Type 2 diabetes mellitus without complications: Secondary | ICD-10-CM

## 2014-10-08 LAB — GLUCOSE, CAPILLARY
GLUCOSE-CAPILLARY: 142 mg/dL — AB (ref 70–99)
GLUCOSE-CAPILLARY: 133 mg/dL — AB (ref 70–99)
Glucose-Capillary: 156 mg/dL — ABNORMAL HIGH (ref 70–99)
Glucose-Capillary: 230 mg/dL — ABNORMAL HIGH (ref 70–99)

## 2014-10-08 NOTE — Progress Notes (Signed)
64 year old right handed female with history of leukocytoclastic vasculitis, diabetes mellitus and peripheral neuropathy, cardiomyopathy, COPD/tobacco abuse, hypertension and bilateral breast cancer with lumpectomy as well as chemoradiation. Presented 09/27/2014 with severe headache, blurred vision, right-sided weakness and difficulty in swallowing. Initial cranial CT scan negative. MRI of the brain with acute right lateral medullary infarct as well as suspected inferior right lateral cerebellar hemisphere involvement. MRA of the head with some artifactual signal loss in the mid V4 segment with a suspected underlying 1 cm 75% stenosis on the right. Echocardiogram with ejection fraction of 16% grade 1 diastolic dysfunction. Carotid Dopplers with no ICA stenosis. Patient did not receive TPA.  Subjective/Complaints: Right facial pain last noc, woke her up but was relieved with tylenol   Review of Systems - Negative except swallowing and balance problems Objective: Vital Signs: Blood pressure 151/64, pulse 67, temperature 97.7 F (36.5 C), temperature source Oral, resp. rate 18, weight 68.539 kg (151 lb 1.6 oz), SpO2 97 %. No results found. Results for orders placed or performed during the hospital encounter of 10/01/14 (from the past 72 hour(s))  Glucose, capillary     Status: Abnormal   Collection Time: 10/05/14 11:30 AM  Result Value Ref Range   Glucose-Capillary 184 (H) 70 - 99 mg/dL   Comment 1 Notify RN   Glucose, capillary     Status: Abnormal   Collection Time: 10/05/14  4:28 PM  Result Value Ref Range   Glucose-Capillary 173 (H) 70 - 99 mg/dL  Glucose, capillary     Status: Abnormal   Collection Time: 10/05/14  8:51 PM  Result Value Ref Range   Glucose-Capillary 184 (H) 70 - 99 mg/dL  Glucose, capillary     Status: Abnormal   Collection Time: 10/06/14  6:40 AM  Result Value Ref Range   Glucose-Capillary 156 (H) 70 - 99 mg/dL  Glucose, capillary     Status: Abnormal   Collection  Time: 10/06/14 11:06 AM  Result Value Ref Range   Glucose-Capillary 210 (H) 70 - 99 mg/dL  Glucose, capillary     Status: Abnormal   Collection Time: 10/06/14  5:20 PM  Result Value Ref Range   Glucose-Capillary 143 (H) 70 - 99 mg/dL  Glucose, capillary     Status: Abnormal   Collection Time: 10/06/14  9:52 PM  Result Value Ref Range   Glucose-Capillary 211 (H) 70 - 99 mg/dL  Glucose, capillary     Status: Abnormal   Collection Time: 10/07/14  6:42 AM  Result Value Ref Range   Glucose-Capillary 133 (H) 70 - 99 mg/dL  Glucose, capillary     Status: Abnormal   Collection Time: 10/07/14 11:02 AM  Result Value Ref Range   Glucose-Capillary 149 (H) 70 - 99 mg/dL  Glucose, capillary     Status: Abnormal   Collection Time: 10/07/14  4:23 PM  Result Value Ref Range   Glucose-Capillary 161 (H) 70 - 99 mg/dL  Glucose, capillary     Status: Abnormal   Collection Time: 10/07/14  8:34 PM  Result Value Ref Range   Glucose-Capillary 205 (H) 70 - 99 mg/dL  Glucose, capillary     Status: Abnormal   Collection Time: 10/08/14  7:13 AM  Result Value Ref Range   Glucose-Capillary 142 (H) 70 - 99 mg/dL     HEENT: normal Cardio: RRR and no murmur Resp: CTA B/L and difficulty clearing secretions GI: BS positive and NT, ND Extremity:  No Edema Skin:   Intact Neuro: Alert/Oriented, Cranial  Nerve Abnormalities R V1,2,3 sensory, Abnormal Sensory reduced in LUE and LLE, Abnormal FMC Ataxic/ dec FMC and Other Right lateral pulsion during sitting Musc/Skel:  Normal Gen NAD, voice quality hoarse   Assessment/Plan: 1. 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 Physiatrist is providing close team supervision and 24 hour management of active medical problems listed below. Physiatrist and rehab team continue to assess barriers to discharge/monitor patient  progress toward functional and medical goals. FIM: FIM - Bathing Bathing Steps Patient Completed: Chest, Right Arm, Left Arm, Abdomen, Front perineal area, Buttocks, Right upper leg, Left upper leg, Right lower leg (including foot), Left lower leg (including foot) Bathing: 4: Steadying assist  FIM - Upper Body Dressing/Undressing Upper body dressing/undressing steps patient completed: Thread/unthread right sleeve of pullover shirt/dresss, Thread/unthread left sleeve of pullover shirt/dress, Pull shirt over trunk, Put head through opening of pull over shirt/dress Upper body dressing/undressing: 4: Steadying assist FIM - Lower Body Dressing/Undressing Lower body dressing/undressing steps patient completed: Thread/unthread right pants leg, Thread/unthread left pants leg, Pull pants up/down, Don/Doff right sock, Don/Doff left sock, Thread/unthread right underwear leg, Thread/unthread left underwear leg, Pull underwear up/down Lower body dressing/undressing: 4: Steadying Assist  FIM - Toileting Toileting steps completed by patient: Adjust clothing prior to toileting, Performs perineal hygiene, Adjust clothing after toileting Toileting Assistive Devices: Grab bar or rail for support Toileting: 4: Steadying assist  FIM - Radio producer Devices: Recruitment consultant Transfers: 4-To toilet/BSC: Min A (steadying Pt. > 75%), 4-From toilet/BSC: Min A (steadying Pt. > 75%)  FIM - Bed/Chair Transfer Bed/Chair Transfer Assistive Devices: Bed rails, Arm rests Bed/Chair Transfer: 5: Sit > Supine: Supervision (verbal cues/safety issues), 4: Chair or W/C > Bed: Min A (steadying Pt. > 75%)  FIM - Locomotion: Wheelchair Distance: 150 Locomotion: Wheelchair: 5: Travels 150 ft or more: maneuvers on rugs and over door sills with supervision, cueing or coaxing FIM - Locomotion: Ambulation Locomotion: Ambulation Assistive Devices: Museum/gallery curator Ambulation/Gait Assistance: 3: Mod  assist, 2: Max assist Locomotion: Ambulation: 1: Travels less than 50 ft with maximal assistance (Pt: 25 - 49%)  Comprehension Comprehension Mode: Auditory Comprehension: 5-Understands complex 90% of the time/Cues < 10% of the time  Expression Expression Mode: Verbal Expression: 5-Expresses complex 90% of the time/cues < 10% of the time  Social Interaction Social Interaction: 6-Interacts appropriately with others with medication or extra time (anti-anxiety, antidepressant).  Problem Solving Problem Solving: 6-Solves complex problems: With extra time  Memory Memory: 6-More than reasonable amt of time  Medical Problem List and Plan: 1. 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 DVT Prophylaxis/Anticoagulation: SQ Lovenox .Monitor platelet and any signs of bleeding 3. Pain Management: Tylenol 4. Dysphagia. Pured honey thick liquids. Follow-up speech therapy.  continue low rate IVF HS until liquids advanced 5. Neuropsych: This patient is capable of making decisions on her own behalf. 6. Skin/Wound Care: Routine skin checks 7. Fluids/Electrolytes/Nutrition: Strict I & O.Follow up labs 8.Hypertension. No present antihypertensive medication. Patient on Toprol 50 mg daily prior to admission as well as Tribenzor 40-5-12.5 mg daily.resume as tolerated.  -resumed toprol at 25mg  daily 9.diabetes mellitus with peripheral neuropathy. Patient with hemoglobin A1c 8.9. Sliding scale insulin. Check blood sugars before meals and at bedtime. Patient on Glucophage 500 mg daily as per home regimen--  -diabetic education  -some improvement with glucophage----add pm  dose, monitor effect 10.Hyperlipidemia.lipitor 11.COPD/tobacco abuse.Counseling 12.HX Bilat. breast cancer.Follow up oncology out patient 13.  Hypokalemia- may be IV related now on po K   LOS (Days) 7 A FACE TO FACE EVALUATION WAS PERFORMED  KIRSTEINS,ANDREW E 10/08/2014, 7:32 AM

## 2014-10-08 NOTE — Progress Notes (Signed)
Speech Language Pathology Daily Session Note  Patient Details  Name: Madison Todd MRN: [id number] Date of Birth: March 24, 1951  Today's Date: 10/08/2014 SLP Individual Time: 0623-7628 SLP Individual Time Calculation (min): 60 min  Short Term Goals: Week 1: SLP Short Term Goal 1 (Week 1): Pt will tolerate presentations of her currently prescribed diet wtih no overt s/s of aspiration and supervision cues for use of swallowing precautions.  SLP Short Term Goal 2 (Week 1): Pt will tolerate trials of advanced consistencies with no overt s/s of aspiraiton and supervision cues for use of swallowing precautions.  SLP Short Term Goal 3 (Week 1): Pt will return demonstration of oral motor strengthening exercises targeting improved right sided strength, range of motion, and coordination for mastication of solid textures with supervision cues.  SLP Short Term Goal 4 (Week 1): Pt will return demonstration of pharyngeal strengthening exercises to improve swallowing function and to continue working towards diet advancement with supervision cues   Skilled Therapeutic Interventions: Skilled treatment focused on swallowing goals. SLP facilitated session with supervision level question cues for recall of recommended strategies. Pt performed oral care with Mod I prior to initiating trials of thin water. Pt with strong, immediate cough response x1 as well as frequent throat clearing with water. SLP provided nectar thick liquids with noted decrease in throat clearing. Pt reports that she believes that the nectar thick liquids go down better than the thin and honey thick liquids. Would continue with current diet at this time with recommendations for repeat MBS to assess for readiness to upgrade.   FIM:  Comprehension Comprehension Mode: Auditory Comprehension: 5-Understands complex 90% of the time/Cues < 10% of the time Expression Expression Mode: Verbal Expression: 5-Expresses complex 90% of the time/cues <  10% of the time Social Interaction Social Interaction: 6-Interacts appropriately with others with medication or extra time (anti-anxiety, antidepressant). Problem Solving Problem Solving: 6-Solves complex problems: With extra time Memory Memory: 6-More than reasonable amt of time FIM - Eating Eating Activity: 5: Needs verbal cues/supervision  Pain Pain Assessment Pain Assessment: No/denies pain  Therapy/Group: Individual Therapy   Germain Osgood, M.A. CCC-SLP [phone]  Germain Osgood 10/08/2014, 4:33 PM

## 2014-10-08 NOTE — Progress Notes (Signed)
Physical Therapy Session Note  Patient Details  Name: Madison Todd MRN: [id number] Date of Birth: July 19, 1951  Today's Date: 10/08/2014 PT Individual Time: 1110-1210 PT Individual Time Calculation (min): 60 min   Short Term Goals: Week 1:  PT Short Term Goal 1 (Week 1): Pt will perform basic transfers with Min A to R/L PT Short Term Goal 2 (Week 1): Pt will propel WC x100' with S for activity tolerance PT Short Term Goal 3 (Week 1): Pt will ambulate 100' with Mod A of 1 and RW PT Short Term Goal 4 (Week 1): Pt will perform static standing tolerance x3 min with self-corrected midline orientation, min A PT Short Term Goal 5 (Week 1): Pt will perform 5 stairs with Mod A of 1, bil rails  Skilled Therapeutic Interventions/Progress Updates:    Today's treatment focused on midline orientation and promoting L weight shift to unload RLE during dynamic tasks. Pt continues to be enthusiastic during therapy, despite c/o paresthesias of R face and R leg.  Neuromuscular reeducation in multiple positions with dynamic tasks for reinforcement of midline orientation and stimulation of righting responses for improved postural control: -Transfers to/from w/c to mat, NuStep with Min A for VCs during descent; pt transfers well until final stage of seated balance and does not right to midline well.  - Nu Step x8 min; first bout using BUEs, second bout without use of UEs for dynamic postural challenge - Cup stacking task x 8 min in supported sitting on hi/low mat balance with reaching across midline and outside BOS to encourage R trunk shortening/lengthening - Dynamic standing x 3 min with card game activity to promote tolerance to upright and standing postural control - Gait x 68', no AD, with Mod-Max A x1 with therapist facing pt giving bil elbow support with facilitation for L weight shift/R unloading - Stairs ascend/descend x5 using 2 hand rails, step-to pattern and Mod A for weight shift, sequencing and  technique.  Today PT tried an ACE wrap of R knee for additional proprioceptive feedback, due to pt's c/o instability with recurvatum. Pt reported improved feeling of security during gait with Ace wrap, though wrap did not decrease extensor thrust.  Pt was returned to room via w/c for lunch and rest before next session. Pt seated in w/c with quick release belt, needs within reach.  Plan to provide pt with HEP next session. Pt may be a good candidate for using heel switch and/or Lite Gait.  Therapy Documentation Precautions:  Precautions Precautions: Fall Precaution Comments: Rt lateral lean, double vision Restrictions Weight Bearing Restrictions: No  Pain: Pain Assessment Pain Assessment: No/denies pain  See FIM for current functional status  Therapy/Group: Individual Therapy  Blenda Mounts 10/08/2014, 12:42 PM

## 2014-10-08 NOTE — Plan of Care (Signed)
Problem: RH SKIN INTEGRITY Goal: RH STG SKIN FREE OF INFECTION/BREAKDOWN Patients skin will remain free from breakdown or infection with mod I assist.  Outcome: Progressing No skin breakdown

## 2014-10-08 NOTE — Progress Notes (Signed)
Occupational Therapy Session Note  Patient Details  Name: SHANIQUIA BRAFFORD MRN: [id number] Date of Birth: 11-05-1950  Today's Date: 10/08/2014 OT Individual Time: 0900-1000 OT Individual Time Calculation (min): 60 min    Short Term Goals: Week 1:  OT Short Term Goal 1 (Week 1): Pt will complete toilet transfer with min assist OT Short Term Goal 2 (Week 1): Pt will complete bathing with min assist for standing balance OT Short Term Goal 3 (Week 1): Pt will complete UB dressing with min assist OT Short Term Goal 4 (Week 1): Pt will complete 2 grooming tasks in standing with min assist for standing balance  Skilled Therapeutic Interventions/Progress Updates:    Pt seen for BADL retraining with a focus on standing balance and midline awareness with postural control. Pt opted to bathe at sink. She was able to maintain her balance with no assist,but close supervision as she used the sink for support. With LB dressing, pt stabilized her balance with the RW with min A and then managed her clothing over her hips.  Pt is demonstrating improved sitting balance as she dons her own TED hose and shoes.  Pt was then seen in gym, to work on postural control and balance from the mat. Pt transferred to mat and worked on various hip weight shifting isolations, trunk rotation, reaching over head to maintain symmetrical balance, over head and lateral reaching focusing on head control, and supported sit >< stand. Pt continues to have numbness throughout her R side, but is improving in motor control. Pt transferred back to w/c and was taken back to her room with all needs met. Quick release belt on chair for balance safety.  Therapy Documentation Precautions:  Precautions Precautions: Fall Precaution Comments: Rt lateral lean, double vision Restrictions Weight Bearing Restrictions: No    Vital Signs: Therapy Vitals Temp: 97.7 F (36.5 C) Temp Source: Oral Pulse Rate: 67 Resp: 18 BP: (!) 151/64  mmHg Patient Position (if appropriate): Lying Oxygen Therapy SpO2: 97 % O2 Device: Not Delivered Pain: Pain Assessment Pain Assessment: No/denies pain ADL:   See FIM for current functional status  Therapy/Group: Individual Therapy  [place] 10/08/2014, 8:17 AM

## 2014-10-08 NOTE — Plan of Care (Signed)
Problem: RH BOWEL ELIMINATION Goal: RH STG MANAGE BOWEL WITH ASSISTANCE STG Manage Bowel with Mod I Assistance.  Outcome: Not Progressing LBM 10-05-13 Goal: RH STG MANAGE BOWEL W/MEDICATION W/ASSISTANCE STG Manage Bowel with Medication with mod I Assistance.  Outcome: Not Progressing LBM 10-05-13

## 2014-10-08 NOTE — Plan of Care (Signed)
Problem: RH SAFETY Goal: RH STG ADHERE TO SAFETY PRECAUTIONS W/ASSISTANCE/DEVICE STG Adhere to Safety Precautions With supervision Assistance/Device.  Outcome: Progressing No unsafe behavior noted. Pt calls appropriately

## 2014-10-09 ENCOUNTER — Inpatient Hospital Stay (HOSPITAL_COMMUNITY): Payer: Commercial Managed Care - HMO | Admitting: *Deleted

## 2014-10-09 ENCOUNTER — Inpatient Hospital Stay (HOSPITAL_COMMUNITY): Payer: Commercial Managed Care - HMO | Admitting: Speech Pathology

## 2014-10-09 ENCOUNTER — Inpatient Hospital Stay (HOSPITAL_COMMUNITY): Payer: Commercial Managed Care - HMO | Admitting: Occupational Therapy

## 2014-10-09 LAB — GLUCOSE, CAPILLARY
Glucose-Capillary: 138 mg/dL — ABNORMAL HIGH (ref 70–99)
Glucose-Capillary: 171 mg/dL — ABNORMAL HIGH (ref 70–99)
Glucose-Capillary: 185 mg/dL — ABNORMAL HIGH (ref 70–99)
Glucose-Capillary: 187 mg/dL — ABNORMAL HIGH (ref 70–99)

## 2014-10-09 NOTE — Plan of Care (Signed)
Problem: RH SKIN INTEGRITY Goal: RH STG SKIN FREE OF INFECTION/BREAKDOWN Patients skin will remain free from breakdown or infection with mod I assist.  Outcome: Progressing No skin breakdown

## 2014-10-09 NOTE — Progress Notes (Signed)
Speech Language Pathology Weekly Progress and Session Note  Patient Details  Name: Madison Todd MRN: [id number] Date of Birth: [birth year]  Beginning of progress report period: October 02, 2014 End of progress report period: October 09, 2014  Today's Date: 10/09/2014 SLP Individual Time: 1405-1505 SLP Individual Time Calculation (min): 60 min  Short Term Goals: Week 1: SLP Short Term Goal 1 (Week 1): Pt will tolerate presentations of her currently prescribed diet wtih no overt s/s of aspiration and supervision cues for use of swallowing precautions.  SLP Short Term Goal 1 - Progress (Week 1): Met SLP Short Term Goal 2 (Week 1): Pt will tolerate trials of advanced consistencies with no overt s/s of aspiraiton and supervision cues for use of swallowing precautions.  SLP Short Term Goal 2 - Progress (Week 1): Progressing toward goal SLP Short Term Goal 3 (Week 1): Pt will return demonstration of oral motor strengthening exercises targeting improved right sided strength, range of motion, and coordination for mastication of solid textures with supervision cues.  SLP Short Term Goal 3 - Progress (Week 1): Met SLP Short Term Goal 4 (Week 1): Pt will return demonstration of pharyngeal strengthening exercises to improve swallowing function and to continue working towards diet advancement with supervision cues  SLP Short Term Goal 4 - Progress (Week 1): Met    New Short Term Goals: Week 2: SLP Short Term Goal 1 (Week 2): Pt will tolerate presentations of her currently prescribed diet wtih no overt s/s of aspiration with Mod I  use of swallowing precautions.  SLP Short Term Goal 2 (Week 2): Pt will tolerate trials of advanced consistencies with no overt s/s of aspiraiton and supervision cues for use of swallowing precautions.  SLP Short Term Goal 3 (Week 2): Pt will return demonstration of oral motor strengthening exercises targeting improved right sided strength, range of motion, and  coordination for mastication of solid textures with Mod I  SLP Short Term Goal 4 (Week 2): Pt will return demonstration of pharyngeal strengthening exercises to improve swallowing function and to continue working towards diet advancement with Mod I   Weekly Progress Updates:  Pt made functional gains this reporting period and has met 3 out of 4 short term goals.  Pt is currently supervision-mod I for use of swallowing precautions to minimize overt s/s of aspiration with dys 2 solids and nectar thick liquids.  Pt continues to demonstrate overt s/s of aspiration with thin liquids characterized by an immediate cough which SLP suspects is related to delayed swallow initiation per MBS.  Minimal clinical s/s of aspiration with nectar thick liquids (i.e. Throat clear) and dys 2 solids.  Pt would continue to benefit from skilled ST while inpatient in order to maximize functional independence and reduce burden of care prior to discharge.  Pt education is ongoing.  Anticipate that pt will need ST follow up at discharge but will update recommendations pending progress made while inpatient.    Intensity: Minumum of 1-2 x/day, 30 to 90 minutes Frequency: 5 out of 7 days Duration/Length of Stay: 14-21 days  Treatment/Interventions: Dysphagia/aspiration precaution training;Functional tasks;Patient/family education;Oral motor exercises;Internal/external aids;Environmental controls   Daily Session  Skilled Therapeutic Interventions: Pt was seen for skilled ST targeting dysphagia goals. Upon arrival, pt was seated upright in wheelchair, awake, alert, and agreeable to participate in therapy.  SLP facilitated the session with trials of thin liquids and nectar thick liquids to continue working towards diet advancement.  Pt demonstrated overt s/s of aspiration characterized by  an immediate cough follow mixed thin liquid and dys 2 solid consistencies, no overt s/s of aspiration with nectar thick liquids alone or when mixed  with dys 2 solids.  Recommend a diet upgrade to nectar thick liquids with continued dys 2 solids.  RN made aware.  SLP provided skilled education related to nectar thick liquid consistencies.  Pt also returned demonstration of oral motor and pharyngeal strengthening exercises with supervision question cues.  Goals updated on this date to reflect current progress and plan of care.          FIM:  Comprehension Comprehension Mode: Auditory Comprehension: 5-Understands complex 90% of the time/Cues < 10% of the time Expression Expression Mode: Verbal Expression: 5-Expresses complex 90% of the time/cues < 10% of the time Social Interaction Social Interaction: 6-Interacts appropriately with others with medication or extra time (anti-anxiety, antidepressant). Problem Solving Problem Solving: 6-Solves complex problems: With extra time Memory Memory: 6-More than reasonable amt of time FIM - Eating Eating Activity: 6: More than reasonable amount of time  Pain Pain Assessment Pain Assessment: No/denies pain  Therapy/Group: Individual Therapy  Suki Crockett, Selinda Orion 10/09/2014, 4:05 PM

## 2014-10-09 NOTE — Plan of Care (Signed)
Problem: RH Swallowing Goal: LTG Patient will consume least restrictive PO diet (SLP) LTG: Patient will consume least restrictive PO diet with assist for use of compensatory strategies (SLP)  Upgraded 1/26 Goal: LTG Patient will participate in dysphagia therapy (SLP) LTG: Patient will participate in dysphagia therapy with assist to increase swallow function as evidenced by bedside or objective clinical assessment (SLP)  Upgraded 1/26

## 2014-10-09 NOTE — Progress Notes (Signed)
65 year old right handed female with history of leukocytoclastic vasculitis, diabetes mellitus and peripheral neuropathy, cardiomyopathy, COPD/tobacco abuse, hypertension and bilateral breast cancer with lumpectomy as well as chemoradiation. Presented 09/27/2014 with severe headache, blurred vision, right-sided weakness and difficulty in swallowing. Initial cranial CT scan negative. MRI of the brain with acute right lateral medullary infarct as well as suspected inferior right lateral cerebellar hemisphere involvement. MRA of the head with some artifactual signal loss in the mid V4 segment with a suspected underlying 1 cm 75% stenosis on the right. Echocardiogram with ejection fraction of 14% grade 1 diastolic dysfunction. Carotid Dopplers with no ICA stenosis. Patient did not receive TPA.  Subjective/Complaints: Right facial pain last noc, woke her up but was relieved with tylenol ggiing less on secreations Stillk on IVF due to decreased po fluid intake, honey thick liq  Review of Systems - Negative except swallowing and balance problems Objective: Vital Signs: Blood pressure 157/75, pulse 64, temperature 97.9 F (36.6 C), temperature source Oral, resp. rate 18, weight 68.539 kg (151 lb 1.6 oz), SpO2 97 %. No results found. Results for orders placed or performed during the hospital encounter of 10/01/14 (from the past 72 hour(s))  Glucose, capillary     Status: Abnormal   Collection Time: 10/06/14 11:06 AM  Result Value Ref Range   Glucose-Capillary 210 (H) 70 - 99 mg/dL  Glucose, capillary     Status: Abnormal   Collection Time: 10/06/14  5:20 PM  Result Value Ref Range   Glucose-Capillary 143 (H) 70 - 99 mg/dL  Glucose, capillary     Status: Abnormal   Collection Time: 10/06/14  9:52 PM  Result Value Ref Range   Glucose-Capillary 211 (H) 70 - 99 mg/dL  Glucose, capillary     Status: Abnormal   Collection Time: 10/07/14  6:42 AM  Result Value Ref Range   Glucose-Capillary 133 (H) 70 -  99 mg/dL  Glucose, capillary     Status: Abnormal   Collection Time: 10/07/14 11:02 AM  Result Value Ref Range   Glucose-Capillary 149 (H) 70 - 99 mg/dL  Glucose, capillary     Status: Abnormal   Collection Time: 10/07/14  4:23 PM  Result Value Ref Range   Glucose-Capillary 161 (H) 70 - 99 mg/dL  Glucose, capillary     Status: Abnormal   Collection Time: 10/07/14  8:34 PM  Result Value Ref Range   Glucose-Capillary 205 (H) 70 - 99 mg/dL  Glucose, capillary     Status: Abnormal   Collection Time: 10/08/14  7:13 AM  Result Value Ref Range   Glucose-Capillary 142 (H) 70 - 99 mg/dL  Glucose, capillary     Status: Abnormal   Collection Time: 10/08/14 12:16 PM  Result Value Ref Range   Glucose-Capillary 156 (H) 70 - 99 mg/dL  Glucose, capillary     Status: Abnormal   Collection Time: 10/08/14  4:31 PM  Result Value Ref Range   Glucose-Capillary 133 (H) 70 - 99 mg/dL  Glucose, capillary     Status: Abnormal   Collection Time: 10/08/14  8:33 PM  Result Value Ref Range   Glucose-Capillary 230 (H) 70 - 99 mg/dL   Comment 1 Notify RN   Glucose, capillary     Status: Abnormal   Collection Time: 10/09/14  6:44 AM  Result Value Ref Range   Glucose-Capillary 138 (H) 70 - 99 mg/dL   Comment 1 Notify RN      HEENT: normal Cardio: RRR and no murmur Resp: CTA  B/L and difficulty clearing secretions GI: BS positive and NT, ND Extremity:  No Edema Skin:   Intact Neuro: Alert/Oriented, Cranial Nerve Abnormalities R V1,2,3 sensory, Abnormal Sensory reduced in LUE and LLE, Abnormal FMC Ataxic/ dec FMC and Other Right lateral pulsion during sitting Musc/Skel:  Normal Gen NAD, voice quality hoarse   Assessment/Plan: 1. 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 Physiatrist is providing close team supervision and 24 hour management of active  medical problems listed below. Physiatrist and rehab team continue to assess barriers to discharge/monitor patient progress toward functional and medical goals. FIM: FIM - Bathing Bathing Steps Patient Completed: Chest, Right Arm, Left Arm, Abdomen, Front perineal area, Buttocks, Right upper leg, Left upper leg, Right lower leg (including foot), Left lower leg (including foot) Bathing: 4: Steadying assist  FIM - Upper Body Dressing/Undressing Upper body dressing/undressing steps patient completed: Thread/unthread right sleeve of pullover shirt/dresss, Thread/unthread left sleeve of pullover shirt/dress, Pull shirt over trunk, Put head through opening of pull over shirt/dress Upper body dressing/undressing: 5: Set-up assist to: Obtain clothing/put away FIM - Lower Body Dressing/Undressing Lower body dressing/undressing steps patient completed: Thread/unthread right pants leg, Thread/unthread left pants leg, Pull pants up/down, Don/Doff right sock, Don/Doff left sock, Thread/unthread right underwear leg, Thread/unthread left underwear leg, Pull underwear up/down, Don/Doff right shoe, Don/Doff left shoe, Fasten/unfasten right shoe, Fasten/unfasten left shoe Lower body dressing/undressing: 4: Steadying Assist  FIM - Toileting Toileting steps completed by patient: Adjust clothing prior to toileting, Performs perineal hygiene, Adjust clothing after toileting Toileting Assistive Devices: Grab bar or rail for support Toileting: 4: Steadying assist  FIM - Radio producer Devices: Recruitment consultant Transfers: 4-To toilet/BSC: Min A (steadying Pt. > 75%), 4-From toilet/BSC: Min A (steadying Pt. > 75%)  FIM - Bed/Chair Transfer Bed/Chair Transfer Assistive Devices: Bed rails, Arm rests Bed/Chair Transfer: 5: Sit > Supine: Supervision (verbal cues/safety issues), 4: Chair or W/C > Bed: Min A (steadying Pt. > 75%)  FIM - Locomotion: Wheelchair Distance: 150 Locomotion:  Wheelchair: 0: Activity did not occur FIM - Locomotion: Ambulation Locomotion: Ambulation Assistive Devices: Other (comment) (none) Ambulation/Gait Assistance: 2: Max assist, 3: Mod assist (PT facing pt to give bil elbow support) Locomotion: Ambulation: 2: Travels 50 - 149 ft with moderate assistance (Pt: 50 - 74%)  Comprehension Comprehension Mode: Auditory Comprehension: 5-Understands basic 90% of the time/requires cueing < 10% of the time  Expression Expression Mode: Verbal Expression: 5-Expresses basic 90% of the time/requires cueing < 10% of the time.  Social Interaction Social Interaction: 6-Interacts appropriately with others with medication or extra time (anti-anxiety, antidepressant).  Problem Solving Problem Solving: 6-Solves complex problems: With extra time  Memory Memory: 6-More than reasonable amt of time  Medical Problem List and Plan: 1. 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 DVT Prophylaxis/Anticoagulation: SQ Lovenox .Monitor platelet and any signs of bleeding 3. Pain Management: Tylenol 4. Dysphagia. Pured honey thick liquids. Follow-up speech therapy.  continue low rate IVF HS until liquids advanced 5. Neuropsych: This patient is capable of making decisions on her own behalf. 6. Skin/Wound Care: Routine skin checks 7. Fluids/Electrolytes/Nutrition: Strict I & O.Follow up labs 8.Hypertension. No present antihypertensive medication. Patient on Toprol 50 mg daily prior to admission as well as Tribenzor 40-5-12.5 mg daily.resume as tolerated.  -resumed toprol at 25mg  daily 9.diabetes mellitus with peripheral neuropathy.  Patient with hemoglobin A1c 8.9. Sliding scale insulin. Check blood sugars before meals and at bedtime. Patient on Glucophage 500 mg daily as per home regimen--  -diabetic education  -some improvement with glucophage----add pm dose,Improving 10.Hyperlipidemia.lipitor 11.COPD/tobacco  abuse.Counseling 12.HX Bilat. breast cancer.Follow up oncology out patient 13.  Hypokalemia- may be IV related now on po K   LOS (Days) 8 A FACE TO FACE EVALUATION WAS PERFORMED  Madison Todd 10/09/2014, 7:03 AM

## 2014-10-09 NOTE — Progress Notes (Signed)
Physical Therapy Weekly Progress Note  Patient Details  Name: Madison Todd MRN: [id number] Date of Birth: 22-Oct-1950  Beginning of progress report period: October 02, 2014 End of progress report period: October 09, 2014  Today's Date: 10/09/2014 PT Individual Time: 0905-1005 PT Individual Time Calculation (min): 60 min   Patient has met 4 of 5 short term goals.  Pt is making progress with functional mobility and demonstrates good participation, attention and enthusiasm. Pt's mobility continues to be limited by strong R lean and difficulty with L weight shift and midline orientation. Seated and Standing balance is improved, but postural control deteriorates with coordinated efforts at ambulation.    Patient continues to demonstrate the following deficits: R head and trunk lean, poor core stability, impaired midline orientation and righting reflexes, diminished sensation in BLEs, and paresthesias of R face and BLEs. Therefore pt will continue to benefit from skilled PT intervention to enhance overall performance with balance, postural control, ability to compensate for deficits, functional use of  right lower extremity and coordination.  Patient progressing toward long term goals..  Plan of care revisions: LTGs for ambulation have been revised to reflect I with household mobility..  PT Short Term Goals Week 2:  PT Short Term Goal 1 (Week 2): Pt will perform transfers safely with S to R/L. PT Short Term Goal 2 (Week 2): Pt will ambulate 29' with Mod A of 1 with LRAD. PT Short Term Goal 3 (Week 2): Pt will reach 10' outside BOS and self-correct for midline orientation during functional household tasks to R/L. PT Short Term Goal 4 (Week 2): Pt will perform 5 stairs with Min A using bil rails PT Short Term Goal 5 (Week 2): Pt will propel w/c 150', managing all parts, with Mod I.   Skilled Therapeutic Interventions/Progress Updates:    Treatment today focused on pre-gait, gait training,  and transfers to progress safe functional mobility.   Neuromuscular reeducation in standing for improved dynamic postural control with: - RLE taps to front, side, back x 5, using colored targets on floor, L hallway wall for stability, and Min A - gait x 30' using L hallway wall and Mod A - Use of mirror for visual feedback to reinforce midline orientation - VCs to use L hallway wall as target for L shoulder - multimodal cues for L weight shift/R unloading  Gait training for functional mobility with: - Pre-gait exercises in dynamic standing, as above - Gait x 10' with weighted grocery cart, with Mod A   - Use of mirror for visual feedback to reinforce midline orientation  Therapeutic activity in seated and standing with transfer training for safety during household mobility. - w/c > bed transfers x1 to L with Min A, cues for technique, sequencing; pt currently requires set-up. - Sit > supine with S; pt demonstrated ability to remove B shoes  Pt propelled to room using W/C x 50' with S, min cues for L attention and turning technique. Pt preferred to lay down before next session; call bell and needs within reach.   Plan to progress pt's I management of all w/c parts and transfer set up.  Therapy Documentation Precautions:  Precautions Precautions: Fall Precaution Comments: Rt lateral lean, double vision Restrictions Weight Bearing Restrictions: No Pain: Pain Assessment Pain Assessment: No/denies pain Locomotion : Ambulation Ambulation/Gait Assistance: 3: Mod assist;2: Max assist Wheelchair Mobility Distance: 50   See FIM for current functional status  Therapy/Group: Individual Therapy  Blenda Mounts 10/09/2014, 12:31 PM

## 2014-10-09 NOTE — Progress Notes (Signed)
Occupational Therapy Weekly Progress Note  Patient Details  Name: Madison Todd MRN: [id number] Date of Birth: [birth year]  Beginning of progress report period: October 02, 2014 End of progress report period: October 09, 2014  Today's Date: 10/09/2014 OT Individual Time: 0800-0905 OT Individual Time Calculation (min): 65 min    Patient has met 4 of 4 short term goals.  Pt has been making progress with her trunk control and balance. She also has much improved RUE coordination to allow her to complete her self care with steadying A and min A for squat pivot transfers.  Patient continues to demonstrate the following deficits: decreased midline awareness, severely impaired sensation throughout R side of body, limited trunk control that impacts balance and therefore will continue to benefit from skilled OT intervention to enhance overall performance with BADL.  Patient progressing toward long term goals..  Continue plan of care.  OT Short Term Goals Week 1:  OT Short Term Goal 1 (Week 1): Pt will complete toilet transfer with min assist OT Short Term Goal 1 - Progress (Week 1): Met OT Short Term Goal 2 (Week 1): Pt will complete bathing with min assist for standing balance OT Short Term Goal 2 - Progress (Week 1): Met OT Short Term Goal 3 (Week 1): Pt will complete UB dressing with min assist OT Short Term Goal 3 - Progress (Week 1): Met OT Short Term Goal 4 (Week 1): Pt will complete 2 grooming tasks in standing with min assist for standing balance OT Short Term Goal 4 - Progress (Week 1): Met Week 2:  OT Short Term Goal 1 (Week 2): Pt will complete bathing with Supervision. OT Short Term Goal 2 (Week 2): Pt will complete LB dressing with Supervision. OT Short Term Goal 3 (Week 2): Pt will complete squat pivot transfer to toilet with steadying A. OT Short Term Goal 4 (Week 2): Pt will be able to sit to stand with steadying A without leaning to R to prepare for LB dressing. OT Short  Term Goal 5 (Week 2): Pt will complete simulated tub bench transfer in tub (with clothes on) with steadying A w/c to tub bench.  Skilled Therapeutic Interventions/Progress Updates:    Pt seen for ADL retraining with a focus on completing transfers with a squat pivot to increase safety, midline awareness in sitting and standing, trunk control.  Pt was in bed eating breakfast in reclined position. Pt sat to EOB to continue eating in an upright position.  Pt then worked on squat pivot transfers 5x. Bed to chair, chair >< toilet, chair >< tub bench all with min A by pt using one arm to push and one to stabilize and keeping her weight low in a squat. Pt does not need assist to lift or lower but does need A for trunk control during the turn as she continues to lean to the R.  Pt is demonstrating much improved trunk control in sitting and dynamic weight shifting as she is bathing. During dressing, pt stands to walker by pushing up with BUE on arm rests then reaching to RW first with L hand and finding her center before adjusting pants over hips. Pt needs to reach back with L hand to sit. She worked on Insurance underwriter with RW and at sink for grooming. R hand coordination is improving well. Pt propelled herself to the therapy room for her next session.  Therapy Documentation Precautions:  Precautions Precautions: Fall Precaution Comments: Rt lateral lean, double vision Restrictions Weight Bearing  Restrictions: No    Vital Signs: Therapy Vitals Temp: 97.9 F (36.6 C) Temp Source: Oral Pulse Rate: 64 Resp: 18 BP: (!) 157/75 mmHg Patient Position (if appropriate): Lying Oxygen Therapy SpO2: 97 % O2 Device: Not Delivered Pain: Pain Assessment Pain Assessment: 0-10 Pain Score: 2  Pain Location: Head Pain Descriptors / Indicators: Headache Pain Frequency: Intermittent Patients Stated Pain Goal: 0 Pain Intervention(s): Medication (See eMAR) ADL:   See FIM for current functional status  Therapy/Group:  Individual Therapy  Donnelly 10/09/2014, 8:11 AM

## 2014-10-10 ENCOUNTER — Inpatient Hospital Stay (HOSPITAL_COMMUNITY): Payer: Commercial Managed Care - HMO | Admitting: Speech Pathology

## 2014-10-10 ENCOUNTER — Inpatient Hospital Stay (HOSPITAL_COMMUNITY): Payer: Commercial Managed Care - HMO

## 2014-10-10 ENCOUNTER — Inpatient Hospital Stay (HOSPITAL_COMMUNITY): Payer: Commercial Managed Care - HMO | Admitting: Occupational Therapy

## 2014-10-10 DIAGNOSIS — I69391 Dysphagia following cerebral infarction: Secondary | ICD-10-CM

## 2014-10-10 LAB — GLUCOSE, CAPILLARY
GLUCOSE-CAPILLARY: 124 mg/dL — AB (ref 70–99)
Glucose-Capillary: 152 mg/dL — ABNORMAL HIGH (ref 70–99)
Glucose-Capillary: 146 mg/dL — ABNORMAL HIGH (ref 70–99)
Glucose-Capillary: 180 mg/dL — ABNORMAL HIGH (ref 70–99)

## 2014-10-10 NOTE — Progress Notes (Signed)
Physical Therapy Session Note  Patient Details  Name: Madison Todd MRN: [id number] Date of Birth: 1951-05-28  Today's Date: 10/10/2014 PT Individual Time: 1115-1200 PT Individual Time Calculation (min): 45 min   Short Term Goals: Week 2:  PT Short Term Goal 1 (Week 2): Pt will perform transfers safely with S to R/L. PT Short Term Goal 2 (Week 2): Pt will ambulate 19' with Mod A of 1 with LRAD. PT Short Term Goal 3 (Week 2): Pt will reach 10' outside BOS and self-correct for midline orientation during functional household tasks to R/L. PT Short Term Goal 4 (Week 2): Pt will perform 5 stairs with Min A using bil rails PT Short Term Goal 5 (Week 2): Pt will propel w/c 150', managing all parts, with Mod I.   Skilled Therapeutic Interventions/Progress Updates:     Today's session focused on w/c parts management, introduction to HEP, and functional mobility. Pt was received in room, seated with quick release belt. No new c/o pain, but pt continues to c/o blurry vision, paresthesias on R face and L body.  Neuromuscular reeducation via multimodal cues for weight shift to L for midline orientation:  - Dynamic sitting x 8 min:  Seated edge of mat, feet unsupported, for UE AROM task and HEP education; Pt maintains dynamic sitting with S - Dynamic standing x 8 min: reaching across midline B x10 to grasp horseshoe and hang on overhead basketball hoop; Pt maintains dynamic standing balance with Min A. - Gait with Harmon Pier walker x 106' with Mod A, cues for L weight shift and external focus for midline orientation  - use of theraband around L thigh for proprioceptive feedback to improve L weight shift, use of ACE wrap on RLE due to extensor thrust  Therapeutic exercise, seated exercises for improved righting responses and midline orientation: -Shoulder AROM using wand for BUEs: forward flexion x 10, scapular plane abduction x 10   Pt progressing with w/c parts management, currently needs assist for  set-up due to vision deficits.  Pt tolerated treatment well, but presented with more than usual lethargy this morning. No c/o diplopia today. Pt continues to use Ace wrap for RLE extensor thrust during gait, but may benefit from knee immobilizer or brace with extension stop. Pt's gait continues to be limited by lack of weight acceptance through LLE, knee instability, strong R lean/diminished midline orientation and visual deficits.   Plan to adjust knee brace for trial with gait tomorrow. Also, will introduce supine mat HEP tomorrow. Continue to emphasize pt I with w/c management and propulsion.  Therapy Documentation Precautions:  Precautions Precautions: Fall Precaution Comments: Rt lateral lean, double vision Restrictions Weight Bearing Restrictions: No  Pain Assessment Pain Assessment: No/denies pain  See FIM for current functional status  Therapy/Group: Individual Therapy  Blenda Mounts 10/10/2014, 3:08 PM

## 2014-10-10 NOTE — Progress Notes (Signed)
Speech Language Pathology Daily Session Note  Patient Details  Name: Madison Todd MRN: [id number] Date of Birth: 09-Feb-1951  Today's Date: 10/10/2014 SLP Individual Time: 1005-1035 SLP Individual Time Calculation (min): 30 min  Short Term Goals: Week 2: SLP Short Term Goal 1 (Week 2): Pt will tolerate presentations of her currently prescribed diet wtih no overt s/s of aspiration with Mod I  use of swallowing precautions.  SLP Short Term Goal 2 (Week 2): Pt will tolerate trials of advanced consistencies with no overt s/s of aspiraiton and supervision cues for use of swallowing precautions.  SLP Short Term Goal 3 (Week 2): Pt will return demonstration of oral motor strengthening exercises targeting improved right sided strength, range of motion, and coordination for mastication of solid textures with Mod I  SLP Short Term Goal 4 (Week 2): Pt will return demonstration of pharyngeal strengthening exercises to improve swallowing function and to continue working towards diet advancement with Mod I   Skilled Therapeutic Interventions:  Pt was seen for skilled ST targeting goals for dysphagia.  Upon arrival, pt was seated upright in the wheelchair, awake, alert, and agreeable to participate in [place].  Pt recalled to perform oral care prior to initiation of PO trials of regular water with min question cues.  SLP completed skilled observations with the abovementioned trials and noted intermittent soft throat clear and strong reflexive cough x1.  Recommend initiating the water protocol to continue working towards liquids advancement.  RN made aware.  Continue per current plan of care.     FIM:  Comprehension Comprehension Mode: Auditory Comprehension: 5-Understands complex 90% of the time/Cues < 10% of the time Expression Expression Mode: Verbal Expression: 5-Expresses complex 90% of the time/cues < 10% of the time Social Interaction Social Interaction: 6-Interacts appropriately with others with  medication or extra time (anti-anxiety, antidepressant). Problem Solving Problem Solving: 6-Solves complex problems: With extra time Memory Memory: 6-More than reasonable amt of time FIM - Eating Eating Activity: 5: Supervision/cues  Pain Pain Assessment Pain Assessment: No/denies pain  Therapy/Group: Individual Therapy  Yue Glasheen, Selinda Orion 10/10/2014, 2:19 PM

## 2014-10-10 NOTE — Progress Notes (Signed)
Occupational Therapy Session Note  Patient Details  Name: Madison Todd MRN: [id number] Date of Birth: 05-Nov-1950  Today's Date: 10/10/2014 OT Individual Time: 9093-1121 OT Individual Time Calculation (min): 45 min    Short Term Goals: Week 1:  OT Short Term Goal 1 (Week 1): Pt will complete toilet transfer with min assist OT Short Term Goal 1 - Progress (Week 1): Met OT Short Term Goal 2 (Week 1): Pt will complete bathing with min assist for standing balance OT Short Term Goal 2 - Progress (Week 1): Met OT Short Term Goal 3 (Week 1): Pt will complete UB dressing with min assist OT Short Term Goal 3 - Progress (Week 1): Met OT Short Term Goal 4 (Week 1): Pt will complete 2 grooming tasks in standing with min assist for standing balance OT Short Term Goal 4 - Progress (Week 1): Met Week 2:  OT Short Term Goal 1 (Week 2): Pt will complete bathing with Supervision. OT Short Term Goal 2 (Week 2): Pt will complete LB dressing with Supervision. OT Short Term Goal 3 (Week 2): Pt will complete squat pivot transfer to toilet with steadying A. OT Short Term Goal 4 (Week 2): Pt will be able to sit to stand with steadying A without leaning to R to prepare for LB dressing. OT Short Term Goal 5 (Week 2): Pt will complete simulated tub bench transfer in tub (with clothes on) with steadying A w/c to tub bench.  Skilled Therapeutic Interventions/Progress Updates:    Pt seen for BADL retraining with a focus on dynamic balance. Pt had already toileted, brushed her teeth, and opted to bathe at the sink. Pt worked on smooth, symmetrical sit to stands using BUE to push up with to the sink. Pt can maintain static standing with close S. Today she needed light contact guard when bathing and pulling up underwear, but mod A as she pulled up her pants which were tighter. Pt states that staying in midline is very challenging for her. Pt was then seen for 15 minutes in the gym at the parallel bars working on  dynamic standing activities of heel raises, front toe taps, mini squats and stepping R leg in and out. Pt returned to her room at the end of the session with all needs met.  Therapy Documentation Precautions:  Precautions Precautions: Fall Precaution Comments: Rt lateral lean, double vision Restrictions Weight Bearing Restrictions: No    Vital Signs: Therapy Vitals Pulse Rate: 62 BP: (!) 147/76 mmHg Pain: Pain Assessment Pain Assessment: No/denies pain Pain Score: 0-No pain ADL:   See FIM for current functional status  Therapy/Group: Individual Therapy  SAGUIER,JULIA 10/10/2014, 9:16 AM

## 2014-10-10 NOTE — Progress Notes (Signed)
Social Work Patient ID: Madison Todd, female   DOB: 08-05-1951, 64 y.o.   MRN: [id number] Met with pt to discuss team conference goals and need to extend her discharge date to reach her supervision level goals to 2/9.  She is agreeable to this.  She is very pleased she is able to drink nectar thick liquids and hopeful she will get to thin liquids before discharge.  Her son is looking to hire one person for she and her Aunt who needs supervision. She had questions regarding how much she would have to pay if discharge changed, will look into this for her.  Feel it was day 1-5 then Covered.  Will get back with her regarding this issue.  WiIl continue to work on discharge plans.

## 2014-10-10 NOTE — Progress Notes (Signed)
Physical Therapy Session Note  Patient Details  Name: Madison Todd MRN: [id number] Date of Birth: 21-Aug-1951  Today's Date: 10/10/2014 PT Individual Time: 1410-1505  PT Individual Time Calculation (min): 55 min   Short Term Goals: Week 2:  PT Short Term Goal 1 (Week 2): Pt will perform transfers safely with S to R/L. PT Short Term Goal 2 (Week 2): Pt will ambulate 68' with Mod A of 1 with LRAD. PT Short Term Goal 3 (Week 2): Pt will reach 10' outside BOS and self-correct for midline orientation during functional household tasks to R/L. PT Short Term Goal 4 (Week 2): Pt will perform 5 stairs with Min A using bil rails PT Short Term Goal 5 (Week 2): Pt will propel w/c 150', managing all parts, with Mod I.   Skilled Therapeutic Interventions/Progress Updates:     Today's afternoon session continued to focus on progressing pt I with w/c parts management, transfers, and functional mobility. Pt was received seated in room with quick release. Her cousin was present, who will not be a primary caregiver but provides social support.  Therapeutic activity to improve participation in community mobility: - Car transfers with squat pivot transfer R/L x2 with S - Pt and family education on: w/c management for car travel, MD clearance to return to driving after stroke - Pt propelled w/c 150' with BUEs with S  Neuromuscular reeducation via multimodal cueing for L weight shift for midline orientation: - Gait training with Harmon Pier walker: first trial x 36' with Min A using theraband around L thigh to provide proprioceptive feedback to encourage L weight acceptance; second trial x 50' with Min A, using theraband and 1/3' heel lift around R shoe to encourage unweighting of RLE and promote L weight shift; pt also used ACE wrap on RLE due to extensor thrust - visual cues to use family member as orientation to midline  Pt's afternoon gait trials showed improvement. Transfers demonstrate improvement in  forward weight shift to stand and better control during descent. Plan to use R heel lift tomorrow and include trial with knee brace with extension stop. Pt may benefit from heel switch for auditory feedback to improve L weight acceptance.  Therapy Documentation Precautions:  Precautions Precautions: Fall Precaution Comments: Rt lateral lean, double vision Restrictions Weight Bearing Restrictions: No Pain: Pain Assessment Pain Assessment: No/denies pain Locomotion : Ambulation Ambulation/Gait Assistance: 4: Min assist Wheelchair Mobility Distance: >150 (>150')  See FIM for current functional status  Therapy/Group: Individual Therapy  Blenda Mounts 10/10/2014, 3:30 PM

## 2014-10-10 NOTE — Progress Notes (Signed)
64 year old right handed female with history of leukocytoclastic vasculitis, diabetes mellitus and peripheral neuropathy, cardiomyopathy, COPD/tobacco abuse, hypertension and bilateral breast cancer with lumpectomy as well as chemoradiation. Presented 09/27/2014 with severe headache, blurred vision, right-sided weakness and difficulty in swallowing. Initial cranial CT scan negative. MRI of the brain with acute right lateral medullary infarct as well as suspected inferior right lateral cerebellar hemisphere involvement. MRA of the head with some artifactual signal loss in the mid V4 segment with a suspected underlying 1 cm 75% stenosis on the right. Echocardiogram with ejection fraction of 52% grade 1 diastolic dysfunction. Carotid Dopplers with no ICA stenosis. Patient did not receive TPA.  Subjective/Complaints: Right facial pain last noc, woke her up but was relieved with tylenol ggiing less on secreations Still on IVF due to decreased po fluid intake, honey thick liq  Review of Systems - Negative except swallowing and balance problems Objective: Vital Signs: Blood pressure 150/70, pulse 66, temperature 98 F (36.7 C), temperature source Oral, resp. rate 18, weight 68.539 kg (151 lb 1.6 oz), SpO2 95 %. No results found. Results for orders placed or performed during the hospital encounter of 10/01/14 (from the past 72 hour(s))  Glucose, capillary     Status: Abnormal   Collection Time: 10/07/14 11:02 AM  Result Value Ref Range   Glucose-Capillary 149 (H) 70 - 99 mg/dL  Glucose, capillary     Status: Abnormal   Collection Time: 10/07/14  4:23 PM  Result Value Ref Range   Glucose-Capillary 161 (H) 70 - 99 mg/dL  Glucose, capillary     Status: Abnormal   Collection Time: 10/07/14  8:34 PM  Result Value Ref Range   Glucose-Capillary 205 (H) 70 - 99 mg/dL  Glucose, capillary     Status: Abnormal   Collection Time: 10/08/14  7:13 AM  Result Value Ref Range   Glucose-Capillary 142 (H) 70 - 99  mg/dL  Glucose, capillary     Status: Abnormal   Collection Time: 10/08/14 12:16 PM  Result Value Ref Range   Glucose-Capillary 156 (H) 70 - 99 mg/dL  Glucose, capillary     Status: Abnormal   Collection Time: 10/08/14  4:31 PM  Result Value Ref Range   Glucose-Capillary 133 (H) 70 - 99 mg/dL  Glucose, capillary     Status: Abnormal   Collection Time: 10/08/14  8:33 PM  Result Value Ref Range   Glucose-Capillary 230 (H) 70 - 99 mg/dL   Comment 1 Notify RN   Glucose, capillary     Status: Abnormal   Collection Time: 10/09/14  6:44 AM  Result Value Ref Range   Glucose-Capillary 138 (H) 70 - 99 mg/dL   Comment 1 Notify RN   Glucose, capillary     Status: Abnormal   Collection Time: 10/09/14 11:25 AM  Result Value Ref Range   Glucose-Capillary 171 (H) 70 - 99 mg/dL  Glucose, capillary     Status: Abnormal   Collection Time: 10/09/14  4:46 PM  Result Value Ref Range   Glucose-Capillary 185 (H) 70 - 99 mg/dL  Glucose, capillary     Status: Abnormal   Collection Time: 10/09/14  9:25 PM  Result Value Ref Range   Glucose-Capillary 187 (H) 70 - 99 mg/dL  Glucose, capillary     Status: Abnormal   Collection Time: 10/10/14  6:36 AM  Result Value Ref Range   Glucose-Capillary 152 (H) 70 - 99 mg/dL     HEENT: normal Cardio: RRR and no murmur Resp: CTA  B/L and difficulty clearing secretions GI: BS positive and NT, ND Extremity:  No Edema Skin:   Intact Neuro: Alert/Oriented, Cranial Nerve Abnormalities R V1,2,3 sensory, Abnormal Sensory reduced in LUE and LLE, Abnormal FMC Ataxic/ dec FMC and Other Right lateral pulsion during sitting Musc/Skel:  Normal Gen NAD, voice quality hoarse   Assessment/Plan: 1. 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 Physiatrist is providing close team supervision and 24 hour management of active medical  problems listed below. Physiatrist and rehab team continue to assess barriers to discharge/monitor patient progress toward functional and medical goals. FIM: FIM - Bathing Bathing Steps Patient Completed: Chest, Right Arm, Left Arm, Abdomen, Front perineal area, Buttocks, Right upper leg, Left upper leg, Right lower leg (including foot), Left lower leg (including foot) Bathing: 4: Steadying assist  FIM - Upper Body Dressing/Undressing Upper body dressing/undressing steps patient completed: Thread/unthread right sleeve of pullover shirt/dresss, Thread/unthread left sleeve of pullover shirt/dress, Pull shirt over trunk, Put head through opening of pull over shirt/dress Upper body dressing/undressing: 5: Set-up assist to: Obtain clothing/put away FIM - Lower Body Dressing/Undressing Lower body dressing/undressing steps patient completed: Thread/unthread right pants leg, Thread/unthread left pants leg, Pull pants up/down, Don/Doff right sock, Don/Doff left sock, Thread/unthread right underwear leg, Thread/unthread left underwear leg, Pull underwear up/down, Don/Doff right shoe, Don/Doff left shoe, Fasten/unfasten right shoe, Fasten/unfasten left shoe Lower body dressing/undressing: 4: Steadying Assist  FIM - Toileting Toileting steps completed by patient: Adjust clothing prior to toileting, Performs perineal hygiene, Adjust clothing after toileting Toileting Assistive Devices: Grab bar or rail for support Toileting: 0: Activity did not occur  FIM - Radio producer Devices: Grab bars Toilet Transfers: 4-To toilet/BSC: Min A (steadying Pt. > 75%), 4-From toilet/BSC: Min A (steadying Pt. > 75%)  FIM - Bed/Chair Transfer Bed/Chair Transfer Assistive Devices: Bed rails, Arm rests Bed/Chair Transfer: 5: Sit > Supine: Supervision (verbal cues/safety issues), 4: Chair or W/C > Bed: Min A (steadying Pt. > 75%)  FIM - Locomotion: Wheelchair Distance: 50 Locomotion:  Wheelchair: 2: Travels 50 - 149 ft with supervision, cueing or coaxing FIM - Locomotion: Ambulation Locomotion: Ambulation Assistive Devices: Other (comment) (L hallway wall, weighted grocery cart) Ambulation/Gait Assistance: 3: Mod assist, 2: Max assist Locomotion: Ambulation: 1: Travels less than 50 ft with moderate assistance (Pt: 50 - 74%)  Comprehension Comprehension Mode: Auditory Comprehension: 5-Understands complex 90% of the time/Cues < 10% of the time  Expression Expression Mode: Verbal Expression: 5-Expresses complex 90% of the time/cues < 10% of the time  Social Interaction Social Interaction: 6-Interacts appropriately with others with medication or extra time (anti-anxiety, antidepressant).  Problem Solving Problem Solving: 6-Solves complex problems: With extra time  Memory Memory: 6-More than reasonable amt of time  Medical Problem List and Plan: 1. 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 DVT Prophylaxis/Anticoagulation: SQ Lovenox .Monitor platelet and any signs of bleeding 3. Pain Management: Tylenol 4. Dysphagia. Pured honey thick liquids. Follow-up speech therapy.  continue low rate IVF HS until liquids advanced 5. Neuropsych: This patient is capable of making decisions on her own behalf. 6. Skin/Wound Care: Routine skin checks 7. Fluids/Electrolytes/Nutrition: Strict I & O.Follow up labs 8.Hypertension. No present antihypertensive medication. Patient on Toprol 50 mg daily prior to admission as well as Tribenzor 40-5-12.5 mg daily.resume as tolerated.  -resumed toprol at 25mg  daily 9.diabetes mellitus  with peripheral neuropathy. Patient with hemoglobin A1c 8.9. Sliding scale insulin. Check blood sugars before meals and at bedtime. Patient on Glucophage 500 mg daily as per home regimen--  -diabetic education  -some improvement with glucophage----add pm dose,Improving 10.Hyperlipidemia.lipitor 11.COPD/tobacco  abuse.Counseling 12.HX Bilat. breast cancer.Follow up oncology out patient 13.  Hypokalemia- may be IV related now on po K   LOS (Days) 9 A FACE TO FACE EVALUATION WAS PERFORMED  KIRSTEINS,ANDREW E 10/10/2014, 7:02 AM

## 2014-10-10 NOTE — Patient Care Conference (Signed)
Inpatient RehabilitationTeam Conference and Plan of Care Update Date: 10/10/2014   Time: 10;40 AM    Patient Name: Madison Todd      Medical Record Number: [id number]  Date of Birth: 05-02-1951 Sex: Female         Room/Bed: 4M04C/4M04C-01 Payor Info: Payor: [employer] MEDICARE / Plan: [place] [facility]/NTSP / Product Type: *No Product type* /    Admitting Diagnosis: Medulla and cerebellar CVA  Admit Date/Time:  10/01/2014  3:47 PM Admission Comments: No comment available   Primary Diagnosis:  Wallenberg syndrome Principal Problem: Wallenberg syndrome  Patient Active Problem List   Diagnosis Date Noted  . Dysphagia S/P CVA (cerebrovascular accident) 10/10/2014  . Type 2 diabetes mellitus not at goal 10/05/2014  . 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 Lacunar infarction 10/01/2014  . 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 CVA (cerebral infarction) 09/28/2014  . TIA (transient ischemic attack) 09/27/2014  . HTN (hypertension) 09/27/2014  . Breast cancer, right breast 12/16/2013  . 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 Adenomatous polyp 10/02/2013  . 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 Cardiomyopathy 07/22/2011  . COPD (chronic obstructive pulmonary disease) 07/22/2011    Expected Discharge Date: Expected Discharge Date: 10/23/14  Team Members Present: Physician leading conference: Dr. Alysia Penna Social Worker Present: Ovidio Kin, LCSW Nurse Present: Heather Roberts, RN PT Present: Raylene Everts, PT;Blair Hobble, Renaye Rakers, PT OT Present: Meriel Pica, Jules Schick, OT SLP Present: Windell Moulding, SLP PPS Coordinator present : Daiva Nakayama, RN, CRRN     Current Status/Progress Goal Weekly Team Focus  Medical   Leaning to the right although improving. This cephalgia now on nectar liquids  Home discharge  Work on truncal stability   Bowel/Bladder   Continent of bowel and bladder. LBM  10/08/14.   Mod I  Assess q shift   Swallow/Nutrition/ Hydration   Dys 2, nectar thick, intermittent supervision; full supervision for cup sips of regular water per the water protocol   Mod I for least restrictive diet, upgraded   trials of advanced consistencies, diet toleration, pharyngeal and oral motor strengthening exercises.     ADL's   progressed to min A transfers; continues to need mod A standing balance without support, min with UE support, improving RUE coordination; overall steady A with self care  supervision overall  midline orientation, balance, postural control, RUE NMR, ADL retraining, pt education   Mobility   Min A transfers, Mod/max A gait x25', Mod A 5 stairs, S WC prop - vision improving, R lean still limiting progress  S transfers and gait x50', Min A stairs, mod I WC prop  Midline orientation, neuro re-ed, standing balance, transfers, gait with appropriate device, stairs.    Communication     na        Safety/Cognition/ Behavioral Observations    no unsafe behaviors        Pain   No c/o pain  <3  Assess for nonverbal cues of pain   Skin   CDI, Old L masectomy and surgical incision/ healed  No additional skin breakdown  Reminder  to turn q 2hrs       *See Care Plan and progress notes for long and short-term goals.  Barriers to Discharge: slow progress, heavy Physical assist    Possible Resolutions to Barriers:  extend stay    Discharge Planning/Teaching Needs:  Home with hired caregiver for pt and her Aunt-whom needs 85  hr care.      Team Discussion:  Diet upgraded to nectar thick and water protocol. Altered sensation on whole right side, impedes her therapies. Balance issues, will need longer to reach her goals of supervision level.  Revisions to Treatment Plan:  Extended discharge date 2/9 due to slower progress than expected   Continued Need for Acute Rehabilitation Level of Care: The patient requires daily medical management by a physician with  specialized training in physical medicine and rehabilitation for the following conditions: Daily direction of a multidisciplinary physical rehabilitation program to ensure safe treatment while eliciting the highest outcome that is of practical value to the patient.: Yes Daily medical management of patient stability for increased activity during participation in an intensive rehabilitation regime.: Yes Daily analysis of laboratory values and/or radiology reports with any subsequent need for medication adjustment of medical intervention for : Neurological problems  Elease Hashimoto 10/10/2014, 2:37 PM

## 2014-10-11 ENCOUNTER — Encounter (HOSPITAL_COMMUNITY): Payer: Commercial Managed Care - HMO

## 2014-10-11 ENCOUNTER — Inpatient Hospital Stay (HOSPITAL_COMMUNITY): Payer: Commercial Managed Care - HMO | Admitting: Speech Pathology

## 2014-10-11 ENCOUNTER — Inpatient Hospital Stay (HOSPITAL_COMMUNITY): Payer: Commercial Managed Care - HMO | Admitting: Occupational Therapy

## 2014-10-11 ENCOUNTER — Inpatient Hospital Stay (HOSPITAL_COMMUNITY): Payer: Commercial Managed Care - HMO

## 2014-10-11 ENCOUNTER — Inpatient Hospital Stay (HOSPITAL_COMMUNITY): Payer: Commercial Managed Care - HMO | Admitting: *Deleted

## 2014-10-11 LAB — GLUCOSE, CAPILLARY
GLUCOSE-CAPILLARY: 146 mg/dL — AB (ref 70–99)
Glucose-Capillary: 145 mg/dL — ABNORMAL HIGH (ref 70–99)
Glucose-Capillary: 133 mg/dL — ABNORMAL HIGH (ref 70–99)
Glucose-Capillary: 151 mg/dL — ABNORMAL HIGH (ref 70–99)

## 2014-10-11 MED ORDER — SODIUM CHLORIDE 0.45 % IV SOLN: INTRAVENOUS | Status: DC

## 2014-10-11 MED ORDER — GABAPENTIN 300 MG PO CAPS
300.0000 mg | ORAL_CAPSULE | Freq: Every day | ORAL | Status: DC
  Administered 2014-10-11 – 2014-10-22 (×11): 300 mg via ORAL
  Filled 2014-10-11 (×13): qty 1

## 2014-10-11 NOTE — Progress Notes (Signed)
64 year old right handed female with history of leukocytoclastic vasculitis, diabetes mellitus and peripheral neuropathy, cardiomyopathy, COPD/tobacco abuse, hypertension and bilateral breast cancer with lumpectomy as well as chemoradiation. Presented 09/27/2014 with severe headache, blurred vision, right-sided weakness and difficulty in swallowing. Initial cranial CT scan negative. MRI of the brain with acute right lateral medullary infarct as well as suspected inferior right lateral cerebellar hemisphere involvement. MRA of the head with some artifactual signal loss in the mid V4 segment with a suspected underlying 1 cm 75% stenosis on the right. Echocardiogram with ejection fraction of 36% grade 1 diastolic dysfunction. Carotid Dopplers with no ICA stenosis. Patient did not receive TPA.  Subjective/Complaints: Right facial pain last noc, woke her up but was relieved with tylenol  Pt states she received no IVF last noc  Review of Systems - Negative except swallowing and balance problems Objective: Vital Signs: Blood pressure 151/69, pulse 60, temperature 97.8 F (36.6 C), temperature source Oral, resp. rate 18, weight 69 kg (152 lb 1.9 oz), SpO2 98 %. No results found. Results for orders placed or performed during the hospital encounter of 10/01/14 (from the past 72 hour(s))  Glucose, capillary     Status: Abnormal   Collection Time: 10/08/14  7:13 AM  Result Value Ref Range   Glucose-Capillary 142 (H) 70 - 99 mg/dL  Glucose, capillary     Status: Abnormal   Collection Time: 10/08/14 12:16 PM  Result Value Ref Range   Glucose-Capillary 156 (H) 70 - 99 mg/dL  Glucose, capillary     Status: Abnormal   Collection Time: 10/08/14  4:31 PM  Result Value Ref Range   Glucose-Capillary 133 (H) 70 - 99 mg/dL  Glucose, capillary     Status: Abnormal   Collection Time: 10/08/14  8:33 PM  Result Value Ref Range   Glucose-Capillary 230 (H) 70 - 99 mg/dL   Comment 1 Notify RN   Glucose, capillary      Status: Abnormal   Collection Time: 10/09/14  6:44 AM  Result Value Ref Range   Glucose-Capillary 138 (H) 70 - 99 mg/dL   Comment 1 Notify RN   Glucose, capillary     Status: Abnormal   Collection Time: 10/09/14 11:25 AM  Result Value Ref Range   Glucose-Capillary 171 (H) 70 - 99 mg/dL  Glucose, capillary     Status: Abnormal   Collection Time: 10/09/14  4:46 PM  Result Value Ref Range   Glucose-Capillary 185 (H) 70 - 99 mg/dL  Glucose, capillary     Status: Abnormal   Collection Time: 10/09/14  9:25 PM  Result Value Ref Range   Glucose-Capillary 187 (H) 70 - 99 mg/dL  Glucose, capillary     Status: Abnormal   Collection Time: 10/10/14  6:36 AM  Result Value Ref Range   Glucose-Capillary 152 (H) 70 - 99 mg/dL  Glucose, capillary     Status: Abnormal   Collection Time: 10/10/14 12:21 PM  Result Value Ref Range   Glucose-Capillary 146 (H) 70 - 99 mg/dL  Glucose, capillary     Status: Abnormal   Collection Time: 10/10/14  5:04 PM  Result Value Ref Range   Glucose-Capillary 124 (H) 70 - 99 mg/dL   Comment 1 Notify RN   Glucose, capillary     Status: Abnormal   Collection Time: 10/10/14  8:54 PM  Result Value Ref Range   Glucose-Capillary 180 (H) 70 - 99 mg/dL     HEENT: normal Cardio: RRR and no murmur Resp: CTA  B/L and difficulty clearing secretions GI: BS positive and NT, ND Extremity:  No Edema Skin:   Intact Neuro: Alert/Oriented, Cranial Nerve Abnormalities R V1,2,3 sensory, Abnormal Sensory reduced in LUE and LLE, Abnormal FMC Ataxic/ dec FMC and Other Right lateral pulsion during sitting Musc/Skel:  Normal Gen NAD, voice quality hoarse   Assessment/Plan: 1. 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 Physiatrist is providing close team supervision and 24 hour management of active medical problems listed  below. Physiatrist and rehab team continue to assess barriers to discharge/monitor patient progress toward functional and medical goals. FIM: FIM - Bathing Bathing Steps Patient Completed: Chest, Right Arm, Left Arm, Abdomen, Front perineal area, Buttocks, Right upper leg, Left upper leg, Right lower leg (including foot), Left lower leg (including foot) Bathing: 4: Steadying assist  FIM - Upper Body Dressing/Undressing Upper body dressing/undressing steps patient completed: Thread/unthread right sleeve of pullover shirt/dresss, Thread/unthread left sleeve of pullover shirt/dress, Pull shirt over trunk, Put head through opening of pull over shirt/dress Upper body dressing/undressing: 5: Set-up assist to: Obtain clothing/put away FIM - Lower Body Dressing/Undressing Lower body dressing/undressing steps patient completed: Thread/unthread right pants leg, Thread/unthread left pants leg, Pull pants up/down, Don/Doff right sock, Don/Doff left sock, Thread/unthread right underwear leg, Thread/unthread left underwear leg, Pull underwear up/down, Don/Doff right shoe, Don/Doff left shoe, Fasten/unfasten right shoe, Fasten/unfasten left shoe Lower body dressing/undressing: 4: Steadying Assist  FIM - Toileting Toileting steps completed by patient: Adjust clothing prior to toileting, Performs perineal hygiene, Adjust clothing after toileting Toileting Assistive Devices: Grab bar or rail for support Toileting: 0: Activity did not occur  FIM - Radio producer Devices: Grab bars Toilet Transfers: 4-To toilet/BSC: Min A (steadying Pt. > 75%), 4-From toilet/BSC: Min A (steadying Pt. > 75%)  FIM - Bed/Chair Transfer Bed/Chair Transfer Assistive Devices: Bed rails, Arm rests Bed/Chair Transfer: 5: Sit > Supine: Supervision (verbal cues/safety issues), 4: Chair or W/C > Bed: Min A (steadying Pt. > 75%)  FIM - Locomotion: Wheelchair Distance: >150 (>150') Locomotion: Wheelchair: 5:  Travels 150 ft or more: maneuvers on rugs and over door sills with supervision, cueing or coaxing FIM - Locomotion: Ambulation Locomotion: Ambulation Assistive Devices: Nutritional therapist Ambulation/Gait Assistance: 3: Mod assist Locomotion: Ambulation: 2: Travels 50 - 149 ft with moderate assistance (Pt: 50 - 74%)  Comprehension Comprehension Mode: Auditory Comprehension: 5-Understands complex 90% of the time/Cues < 10% of the time  Expression Expression Mode: Verbal Expression: 5-Expresses complex 90% of the time/cues < 10% of the time  Social Interaction Social Interaction: 6-Interacts appropriately with others with medication or extra time (anti-anxiety, antidepressant).  Problem Solving Problem Solving: 6-Solves complex problems: With extra time  Memory Memory: 6-More than reasonable amt of time  Medical Problem List and Plan: 1. 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 DVT Prophylaxis/Anticoagulation: SQ Lovenox .Monitor platelet and any signs of bleeding 3. Pain Management: Tylenol 4. Dysphagia. D2 nectar thick liquids. Follow-up speech therapy.  continue low rate IVF HS until liquids advanced 5. Neuropsych: This patient is capable of making decisions on her own behalf. 6. Skin/Wound Care: Routine skin checks 7. Fluids/Electrolytes/Nutrition: Strict I & O.Follow up labs 8.Hypertension. No present antihypertensive medication. Patient on Toprol 50 mg daily prior to admission as well as Tribenzor 40-5-12.5 mg daily.resume as tolerated.  -resumed toprol at 25mg  daily 9.diabetes mellitus  with peripheral neuropathy. Patient with hemoglobin A1c 8.9. Sliding scale insulin. Check blood sugars before meals and at bedtime. Patient on Glucophage 500 mg daily as per home regimen--  -diabetic education  -some improvement with glucophage----add pm dose,Improving 10.Hyperlipidemia.lipitor 11.COPD/tobacco abuse.Counseling 12.HX Bilat. breast  cancer.Follow up oncology out patient 13.  Hypokalemia- may be IV related now on po K   LOS (Days) 10 A FACE TO FACE EVALUATION WAS PERFORMED  Madison Todd E 10/11/2014, 6:53 AM

## 2014-10-11 NOTE — Progress Notes (Signed)
Occupational Therapy Session Note  Patient Details  Name: Madison Todd MRN: [id number] Date of Birth: 12-14-1950  Today's Date: 10/11/2014 OT Individual Time: 0900-1000 OT Individual Time Calculation (min): 60 min    Short Term Goals: Week 1:  OT Short Term Goal 1 (Week 1): Pt will complete toilet transfer with min assist OT Short Term Goal 1 - Progress (Week 1): Met OT Short Term Goal 2 (Week 1): Pt will complete bathing with min assist for standing balance OT Short Term Goal 2 - Progress (Week 1): Met OT Short Term Goal 3 (Week 1): Pt will complete UB dressing with min assist OT Short Term Goal 3 - Progress (Week 1): Met OT Short Term Goal 4 (Week 1): Pt will complete 2 grooming tasks in standing with min assist for standing balance OT Short Term Goal 4 - Progress (Week 1): Met Week 2:  OT Short Term Goal 1 (Week 2): Pt will complete bathing with Supervision. OT Short Term Goal 2 (Week 2): Pt will complete LB dressing with Supervision. OT Short Term Goal 3 (Week 2): Pt will complete squat pivot transfer to toilet with steadying A. OT Short Term Goal 4 (Week 2): Pt will be able to sit to stand with steadying A without leaning to R to prepare for LB dressing. OT Short Term Goal 5 (Week 2): Pt will complete simulated tub bench transfer in tub (with clothes on) with steadying A w/c to tub bench.  Skilled Therapeutic Interventions/Progress Updates:    Pt seen for BADL retraining with a focus on activity tolerance and balance. Pt stated that she was feeling more dizzy, with increased fatigue and decreased balance. She continues to be highly motivated and pushes through her fatigue. Pt was eager to shower and wash her hair, but first transferred bed to w/c to complete oral care at the sink so she could engage in her water protocol.  Pt then transferred into shower using squat pivot. She did not need more A today, but she was leaning to the R more. Pt was able to shower and dress with  steady A in standing. Improved control with both sit><stand. Pt did need to move slower today, but was able to get everything accomplished. Pt in chair finishing her hair at end of session.    Therapy Documentation Precautions:  Precautions Precautions: Fall Precaution Comments: Rt lateral lean, double vision Restrictions Weight Bearing Restrictions: No    Vital Signs: Therapy Vitals BP: (!) 142/70 mmHg Pain: Pain Assessment Pain Assessment: No/denies pain ADL: See FIM for current functional status  Therapy/Group: Individual Therapy  SAGUIER,JULIA 10/11/2014, 11:15 AM

## 2014-10-11 NOTE — Progress Notes (Signed)
Physical Therapy Session Note  Patient Details  Name: Madison Todd MRN: [id number] Date of Birth: 03/27/1951  Today's Date: 10/11/2014 PT Individual Time: 1100-1205 PT Individual Time Calculation (min): 65 min   Short Term Goals: Week 2:  PT Short Term Goal 1 (Week 2): Pt will perform transfers safely with S to R/L. PT Short Term Goal 2 (Week 2): Pt will ambulate 7' with Mod A of 1 with LRAD. PT Short Term Goal 3 (Week 2): Pt will reach 10' outside BOS and self-correct for midline orientation during functional household tasks to R/L. PT Short Term Goal 4 (Week 2): Pt will perform 5 stairs with Min A using bil rails PT Short Term Goal 5 (Week 2): Pt will propel w/c 150', managing all parts, with Mod I.   Skilled Therapeutic Interventions/Progress Updates:     Pt was received in room, seated in w/c. Pt c/o headache which disrupted her sleep last night; RN was informed and pt was administered tylenol at beginning of PT. Pt reports that she felt "off" today, like her balance was off. Continues to c/o difficulty with R eye.   Therapeutic activity via task-specific training for household mobility: - Pt propelled w/c using BUEs and BLEs > 150': from room to therapy gym; hallway to room at end of session; 1 cue to maintain midline orientation while steering w/c - Min A with transfers w/c > mat, mat > w/c x3 R/L; pt had 1 LOB to R during descent  -Min A for occasional steady, cues for set-up and sequencing  Therapeutic exercise via specific strengthening in seated and supine positions with HEP for functional mobility: - shoulder abduction AROM x3 in unsupported sitting - supine bridges x 10; modified bridge/one leg extended B x8 to improve hip extension/gluteal activation - use of HEP handout for visual reinforcement  -"exercise of the day" was Bridge  Neuromuscular reeducation via multimodal cues for midline orientation: - manual facilitation for gluteal activation during TE - Sit  to Stand, Stand to Sit transfers performed with RW, with and without a 4 in step under R, to encourage L weight bearing through ascent/descent of transfer   - visual cues, use of mirror for feedback and to encourage self-correction during STS trials  - tactile cues, with and without a 4 in step under R, to encourage L weight bearing through ascent/descent of transfer  - tactile cues/manual facilitation for L weight acceptance - Gait x 85' with Ethelene Hal and 1/2" shoe lift on R foot to encourage weight shift to L  - gait continues to need visual cues for midline orientation  - VCs today for increased step width; pt replaces R leg in adducted position  - c/o R knee buckling were improved today, no gait with Ace wrap for extensor thrust  W/c management x10 min to progress pt's I with functional mobility: brakes, armrests, footplates - S with all w/c parts management; pt continues to require <10% cues  Transfers are at S level 50%, Min A 50% of trials for occasional steadying assist, multimodal reinforcement of weight shift.  Pt appeared more foggy today than usual and needed reinforcement for technique with mobility. Pt stated "i forgot" several times when asked to teach-back her transfer technique. PT continues to need verbal reinforcement and visual targets for midline orientation.  PT to continue to focus on equal weight bearing, L weight acceptance, L weight shift.  Plan to invite Lattie Haw with rec therapy for aromatherapy or other relaxation treatment.  Therapy Documentation Precautions:  Precautions Precautions: Fall Precaution Comments: Rt lateral lean, double vision Restrictions Weight Bearing Restrictions: No Vital Signs: Therapy Vitals BP: (!) 156/75 mmHg Patient Position (if appropriate): Sitting Pain: Pain Assessment Pain Assessment: No/denies pain Pain Score: 4  Pain Type: Acute pain Pain Location: Head Pain Intervention(s): RN made aware (Brought meds)  See FIM for  current functional status  Therapy/Group: Individual Therapy  Blenda Mounts 10/11/2014, 12:17 PM

## 2014-10-11 NOTE — Consult Note (Signed)
  NEUROCOGNITIVE [place]   Ms.  Madison Todd is a 64 year old, right-handed woman, who was seen for a brief neurocognitive status examination to evaluate her emotional state and mental status post-stroke.  According to her medical record, she was admitted on 09/27/14 with severe headache, blurred vision, right-sided weakness, and trouble swallowing.  Initial cranial CT was negative.  MRI of the brain demonstrated acute right lateral medullary infarct as well as suspected inferior right lateral cerebellar hemisphere involvement.  She did not receive TPA.    Emotional Functioning:  During the clinical interview, Ms. Madison Todd stated that emotionally, she has been "pretty good," though she acknowledged mild concern about the "care she is receiving here."  Specifically, she stated that last night, one of the nurses told her she was there to give her morphine and after Ms. Madison Todd refused, the nurse admitted that she was in the wrong room.  Ms. Madison Todd also said that at times, it does not seem like her doctor and the nurses are on the same page regarding her care.  Still, she denied having major worries and concerns.  She stated that she has survived cancer and she used faith to get her through that and she plans to do the same now.  She reported having significant motivation to participate in her various therapies, though she said that she has a lot of fatigue during the day.  Ms. Madison Todd denied major symptoms of depression, including suicidal ideation.  Her responses to self-report measures of mood symptoms were not suggestive of the presence of clinically significant depression or anxiety at this time.    Mental Status:  Ms.  Madison Todd total score on a very brief measure of mental status was not suggestive of the presence of cognitive disruption at the level of dementia (MMSE-2 brief = 14/16).  She lost points for failing  to freely recall all of 3 previously studied words after a delay and for inability to identify the date.  Subjectively, she reported noticing slowed processing speed, inattention, and problems with word-finding since her stroke.  She denied noticing changes in memory and she also denied any premorbid cognitive difficulties.    Impressions and Recommendations:  Ms. Madison Todd brief mental status score was not suggestive of the presence of cognitive disruption at the level of dementia.  However, owing to her reported cognitive concerns, a comprehensive neuropsychological evaluation is recommended post-discharge.  Contact information for a neuropsychologist in her area should be included in her discharge paperwork for that purpose.  From an emotional standpoint, she seems to be coping well with her current stressors and there was no indication of clinically significant mood disruption at this time.    DIAGNOSIS:   CVA  Marlane Hatcher, Psy.D.  Clinical Neuropsychologist

## 2014-10-11 NOTE — Progress Notes (Signed)
Speech Language Pathology Daily Session Note  Patient Details  Name: Madison Todd MRN: [id number] Date of Birth: [birth year]  Today's Date: 10/11/2014 SLP Individual Time: 1203-1233 SLP Individual Time Calculation (min): 30 min  Short Term Goals: Week 2: SLP Short Term Goal 1 (Week 2): Pt will tolerate presentations of her currently prescribed diet wtih no overt s/s of aspiration with Mod I  use of swallowing precautions.  SLP Short Term Goal 2 (Week 2): Pt will tolerate trials of advanced consistencies with no overt s/s of aspiraiton and supervision cues for use of swallowing precautions.  SLP Short Term Goal 3 (Week 2): Pt will return demonstration of oral motor strengthening exercises targeting improved right sided strength, range of motion, and coordination for mastication of solid textures with Mod I  SLP Short Term Goal 4 (Week 2): Pt will return demonstration of pharyngeal strengthening exercises to improve swallowing function and to continue working towards diet advancement with Mod I   Skilled Therapeutic Interventions:  Pt was seen for skilled ST targeting dysphagia goals.  Upon arrival, pt was seated upright in wheelchair, awake, alert, and agreeable to participate in [place].  SLP completed skilled observations with presentations of pt's currently prescribed diet with pt exhibiting intermittent soft throat clear following mixed consistencies.  Pt utilized her recommended swallowing precautions with supervision.  SLP reviewed and reinforced cup sips of regular water in between meals following thorough oral care per the water protocol to encourage pt to increase intake of PO fluids.  Pt verbalized understanding of the parameters of the water protocol.  Continue per current plan of care.     FIM:  Comprehension Comprehension Mode: Auditory Comprehension: 5-Understands complex 90% of the time/Cues < 10% of the time Expression Expression Mode: Verbal Expression: 5-Expresses complex  90% of the time/cues < 10% of the time Social Interaction Social Interaction: 6-Interacts appropriately with others with medication or extra time (anti-anxiety, antidepressant). Problem Solving Problem Solving: 5-Solves complex 90% of the time/cues < 10% of the time Memory Memory: 6-More than reasonable amt of time FIM - Eating Eating Activity: 5: Supervision/cues  Pain Pain Assessment Pain Assessment: No/denies pain  Therapy/Group: Individual Therapy  Kasi Lasky, Selinda Orion 10/11/2014, 7:14 PM

## 2014-10-11 NOTE — Progress Notes (Signed)
Physical Therapy Session Note  Patient Details  Name: Madison Todd MRN: [id number] Date of Birth: 1951/07/12  Today's Date: 10/11/2014 PT Individual Time: 4098-1191 PT Individual Time Calculation (min): 35 min   Short Term Goals: Week 2:  PT Short Term Goal 1 (Week 2): Pt will perform transfers safely with S to R/L. PT Short Term Goal 2 (Week 2): Pt will ambulate 66' with Mod A of 1 with LRAD. PT Short Term Goal 3 (Week 2): Pt will reach 10' outside BOS and self-correct for midline orientation during functional household tasks to R/L. PT Short Term Goal 4 (Week 2): Pt will perform 5 stairs with Min A using bil rails PT Short Term Goal 5 (Week 2): Pt will propel w/c 150', managing all parts, with Mod I.   Skilled Therapeutic Interventions/Progress Updates:  Patient resting in bed upon entering room. Patient supine to sit and donned shoes with supervision. Patient transferred from bed to wheelchair with min steady assist. Patient worked on weight shifting and limits of stability using visual feedback on Biodex with shoe lift on right to facilitate weight shift to left. Patient ambulated 70 feet with Eva walker and shoe lift on right with mod assist and verbal cueing to facilitate weight shift. Patient with knee recurvatum 75% of the time on right. Patient worked on sit to stand using kinetron to maintain equal weight bearing through LE's and keeping trunk in midline x 7 reps. Patient left in wheelchair with all items in reach.   Therapy Documentation Precautions:  Precautions Precautions: Fall Precaution Comments: Rt lateral lean, double vision Restrictions Weight Bearing Restrictions: No  Pain: Pain Assessment Pain Assessment: No/denies pain Pain Score: 0-No pain  Locomotion : Ambulation Ambulation/Gait Assistance: 3: Mod assist Wheelchair Mobility Distance: > 150'   See FIM for current functional status  Therapy/Group: Individual Therapy  Elder Love  M 10/11/2014, 4:02 PM

## 2014-10-12 ENCOUNTER — Inpatient Hospital Stay (HOSPITAL_COMMUNITY): Payer: Commercial Managed Care - HMO | Admitting: Speech Pathology

## 2014-10-12 ENCOUNTER — Inpatient Hospital Stay (HOSPITAL_COMMUNITY): Payer: Commercial Managed Care - HMO

## 2014-10-12 ENCOUNTER — Inpatient Hospital Stay (HOSPITAL_COMMUNITY): Payer: Commercial Managed Care - HMO | Admitting: Occupational Therapy

## 2014-10-12 ENCOUNTER — Inpatient Hospital Stay (HOSPITAL_COMMUNITY): Payer: Commercial Managed Care - HMO | Admitting: Physical Therapy

## 2014-10-12 LAB — GLUCOSE, CAPILLARY
Glucose-Capillary: 127 mg/dL — ABNORMAL HIGH (ref 70–99)
Glucose-Capillary: 141 mg/dL — ABNORMAL HIGH (ref 70–99)
Glucose-Capillary: 154 mg/dL — ABNORMAL HIGH (ref 70–99)
Glucose-Capillary: 162 mg/dL — ABNORMAL HIGH (ref 70–99)

## 2014-10-12 MED ORDER — AMLODIPINE BESYLATE 5 MG PO TABS
5.0000 mg | ORAL_TABLET | Freq: Every day | ORAL | Status: DC
  Administered 2014-10-12 – 2014-10-13 (×2): 5 mg via ORAL
  Filled 2014-10-12 (×3): qty 1

## 2014-10-12 NOTE — Progress Notes (Signed)
Speech Language Pathology Daily Session Note  Patient Details  Name: Madison Todd MRN: [id number] Date of Birth: 1951/01/10  Today's Date: 10/12/2014 SLP Individual Time: 2778-2423 SLP Individual Time Calculation (min): 30 min  Short Term Goals: Week 2: SLP Short Term Goal 1 (Week 2): Pt will tolerate presentations of her currently prescribed diet wtih no overt s/s of aspiration with Mod I  use of swallowing precautions.  SLP Short Term Goal 2 (Week 2): Pt will tolerate trials of advanced consistencies with no overt s/s of aspiraiton and supervision cues for use of swallowing precautions.  SLP Short Term Goal 3 (Week 2): Pt will return demonstration of oral motor strengthening exercises targeting improved right sided strength, range of motion, and coordination for mastication of solid textures with Mod I  SLP Short Term Goal 4 (Week 2): Pt will return demonstration of pharyngeal strengthening exercises to improve swallowing function and to continue working towards diet advancement with Mod I   Skilled Therapeutic Interventions: Skilled treatment session focused on addressing dysphagia goals.  Patient required Mod assist question cues to recall procedures of Free Water Protocol and Min verbal cues to utilize recommended safe swallow strategies (head turn right and 2 swallows) with intermittent weak reflexive cough.  SLP provided verbal cues for a stronger cough to maximize airway protection.  Continue per current plan of care.       FIM:  Comprehension Comprehension Mode: Auditory Comprehension: 5-Understands complex 90% of the time/Cues < 10% of the time Expression Expression Mode: Verbal Expression: 5-Expresses complex 90% of the time/cues < 10% of the time Social Interaction Social Interaction: 6-Interacts appropriately with others with medication or extra time (anti-anxiety, antidepressant). Problem Solving Problem Solving: 5-Solves complex 90% of the time/cues < 10% of the  time Memory Memory: 6-More than reasonable amt of time FIM - Eating Eating Activity: 5: Supervision/cues  Pain Pain Assessment Pain Assessment: No/denies pain  Therapy/Group: Individual Therapy  Carmelia Roller., [place] [phone]  [place] 10/12/2014, 4:49 PM

## 2014-10-12 NOTE — Progress Notes (Signed)
Social Work Patient ID: Madison Todd, female   DOB: [birth year], 64 y.o.   MRN: [id number] Spoke with Nate-pt's son to update regarding team conferecen progression toward her goals and the ned to extend her discharge date to 2/9 due to slow progress. He was agreeable and continues to look for hired assist, he has interviewed an CNA and this would be appropriate for both pt and her Aunt.  Will continue to work toward discharge And her needs.  Pt is agreeable and pleased to be on a diet and eating.

## 2014-10-12 NOTE — Progress Notes (Signed)
Occupational Therapy Session Note  Patient Details  Name: Madison Todd MRN: [id number] Date of Birth: [birth year]  Today's Date: 10/12/2014 OT Individual Time: 0900-1000 OT Individual Time Calculation (min): 60 min    Short Term Goals: Week 1:  OT Short Term Goal 1 (Week 1): Pt will complete toilet transfer with min assist OT Short Term Goal 1 - Progress (Week 1): Met OT Short Term Goal 2 (Week 1): Pt will complete bathing with min assist for standing balance OT Short Term Goal 2 - Progress (Week 1): Met OT Short Term Goal 3 (Week 1): Pt will complete UB dressing with min assist OT Short Term Goal 3 - Progress (Week 1): Met OT Short Term Goal 4 (Week 1): Pt will complete 2 grooming tasks in standing with min assist for standing balance OT Short Term Goal 4 - Progress (Week 1): Met Week 2:  OT Short Term Goal 1 (Week 2): Pt will complete bathing with Supervision. OT Short Term Goal 2 (Week 2): Pt will complete LB dressing with Supervision. OT Short Term Goal 3 (Week 2): Pt will complete squat pivot transfer to toilet with steadying A. OT Short Term Goal 4 (Week 2): Pt will be able to sit to stand with steadying A without leaning to R to prepare for LB dressing. OT Short Term Goal 5 (Week 2): Pt will complete simulated tub bench transfer in tub (with clothes on) with steadying A w/c to tub bench.  Skilled Therapeutic Interventions/Progress Updates:    Pt seen for BADL retraining of bathing at sink level (pt choice) and dressing with a focus on midline awareness in standing and R side control. Pt completed brushing teeth for her water protocol and drank 6oz water very slowly during the session. Pt stated that she was feeling even more dizzy today especially on her R side. R eye covered with eye patch and pt stated that did help. Pt has improved a great deal with symmetrical sit >< stand and even static standing. Pt only requires steady A during self care with UE support at sink.  Once  self care completed, pt worked for 15 min on standing activities at the sink. She worked on heel raises, mini squats, hip weight shift, reaching to left overhead to extend L trunk.  Pt needed min A to prevent her from shifting too far to the right. Pt tolerated exercises well, but continues to complain of fatigue. Pt resting in w/c in room with call light in reach.  Therapy Documentation Precautions:  Precautions Precautions: Fall Precaution Comments: Rt lateral lean, double vision Restrictions Weight Bearing Restrictions: No  Pain: Pain Assessment Pain Assessment: No/denies pain ADL:  See FIM for current functional status  Therapy/Group: Individual Therapy  [place] 10/12/2014, 11:51 AM

## 2014-10-12 NOTE — Progress Notes (Signed)
Subjective/Complaints: Discussed fluid intake, BP meds and gabapentin  Pt states she received no IVF last noc  Review of Systems - Negative except swallowing and balance problems Objective: Vital Signs: Blood pressure 167/74, pulse 57, temperature 98.1 F (36.7 C), temperature source Oral, resp. rate 18, weight 69 kg (152 lb 1.9 oz), SpO2 99 %. No results found. Results for orders placed or performed during the hospital encounter of 10/01/14 (from the past 72 hour(s))  Glucose, capillary     Status: Abnormal   Collection Time: 10/09/14  6:44 AM  Result Value Ref Range   Glucose-Capillary 138 (H) 70 - 99 mg/dL   Comment 1 Notify RN   Glucose, capillary     Status: Abnormal   Collection Time: 10/09/14 11:25 AM  Result Value Ref Range   Glucose-Capillary 171 (H) 70 - 99 mg/dL  Glucose, capillary     Status: Abnormal   Collection Time: 10/09/14  4:46 PM  Result Value Ref Range   Glucose-Capillary 185 (H) 70 - 99 mg/dL  Glucose, capillary     Status: Abnormal   Collection Time: 10/09/14  9:25 PM  Result Value Ref Range   Glucose-Capillary 187 (H) 70 - 99 mg/dL  Glucose, capillary     Status: Abnormal   Collection Time: 10/10/14  6:36 AM  Result Value Ref Range   Glucose-Capillary 152 (H) 70 - 99 mg/dL  Glucose, capillary     Status: Abnormal   Collection Time: 10/10/14 12:21 PM  Result Value Ref Range   Glucose-Capillary 146 (H) 70 - 99 mg/dL  Glucose, capillary     Status: Abnormal   Collection Time: 10/10/14  5:04 PM  Result Value Ref Range   Glucose-Capillary 124 (H) 70 - 99 mg/dL   Comment 1 Notify RN   Glucose, capillary     Status: Abnormal   Collection Time: 10/10/14  8:54 PM  Result Value Ref Range   Glucose-Capillary 180 (H) 70 - 99 mg/dL  Glucose, capillary     Status: Abnormal   Collection Time: 10/11/14  6:44 AM  Result Value Ref Range   Glucose-Capillary 145 (H) 70 - 99 mg/dL  Glucose, capillary     Status: Abnormal   Collection Time: 10/11/14 12:09 PM   Result Value Ref Range   Glucose-Capillary 133 (H) 70 - 99 mg/dL  Glucose, capillary     Status: Abnormal   Collection Time: 10/11/14  5:01 PM  Result Value Ref Range   Glucose-Capillary 151 (H) 70 - 99 mg/dL   Comment 1 Notify RN   Glucose, capillary     Status: Abnormal   Collection Time: 10/11/14  8:47 PM  Result Value Ref Range   Glucose-Capillary 146 (H) 70 - 99 mg/dL     HEENT: normal Cardio: RRR and no murmur Resp: CTA B/L and difficulty clearing secretions GI: BS positive and NT, ND Extremity:  No Edema Skin:   Intact Neuro: Alert/Oriented, Cranial Nerve Abnormalities R V1,2,3 sensory, Abnormal Sensory reduced in LUE and LLE, Abnormal FMC Ataxic/ dec FMC and Other Right lateral pulsion during sitting Musc/Skel:  Normal Gen NAD, voice quality hoarse   Assessment/Plan: 1. 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 Physiatrist is providing close team supervision and 24 hour management of active medical problems listed below. Physiatrist and rehab team continue to assess barriers to discharge/monitor patient progress toward functional and medical goals. FIM:  FIM - Bathing Bathing Steps Patient Completed: Chest, Right Arm, Left Arm, Abdomen, Front perineal area, Buttocks, Right upper leg, Left upper leg, Right lower leg (including foot), Left lower leg (including foot) Bathing: 4: Steadying assist  FIM - Upper Body Dressing/Undressing Upper body dressing/undressing steps patient completed: Thread/unthread right sleeve of pullover shirt/dresss, Thread/unthread left sleeve of pullover shirt/dress, Pull shirt over trunk, Put head through opening of pull over shirt/dress Upper body dressing/undressing: 5: Set-up assist to: Obtain clothing/put away FIM - Lower Body Dressing/Undressing Lower body dressing/undressing steps patient  completed: Thread/unthread right pants leg, Thread/unthread left pants leg, Pull pants up/down, Don/Doff right sock, Don/Doff left sock, Thread/unthread right underwear leg, Thread/unthread left underwear leg, Pull underwear up/down, Don/Doff right shoe, Don/Doff left shoe, Fasten/unfasten right shoe, Fasten/unfasten left shoe Lower body dressing/undressing: 4: Steadying Assist  FIM - Toileting Toileting steps completed by patient: Adjust clothing prior to toileting, Performs perineal hygiene, Adjust clothing after toileting Toileting Assistive Devices: Grab bar or rail for support Toileting: 0: Activity did not occur  FIM - Radio producer Devices: Grab bars Toilet Transfers: 4-To toilet/BSC: Min A (steadying Pt. > 75%), 4-From toilet/BSC: Min A (steadying Pt. > 75%)  FIM - Bed/Chair Transfer Bed/Chair Transfer Assistive Devices: Bed rails, Arm rests Bed/Chair Transfer: 5: Supine > Sit: Supervision (verbal cues/safety issues), 4: Bed > Chair or W/C: Min A (steadying Pt. > 75%), 4: Chair or W/C > Bed: Min A (steadying Pt. > 75%)  FIM - Locomotion: Wheelchair Distance: > 150' Locomotion: Wheelchair: 5: Travels 150 ft or more: maneuvers on rugs and over door sills with supervision, cueing or coaxing FIM - Locomotion: Ambulation Locomotion: Ambulation Assistive Devices: Nutritional therapist Ambulation/Gait Assistance: 3: Mod assist Locomotion: Ambulation: 2: Travels 50 - 149 ft with moderate assistance (Pt: 50 - 74%)  Comprehension Comprehension Mode: Auditory Comprehension: 5-Understands complex 90% of the time/Cues < 10% of the time  Expression Expression Mode: Verbal Expression: 5-Expresses complex 90% of the time/cues < 10% of the time  Social Interaction Social Interaction: 6-Interacts appropriately with others with medication or extra time (anti-anxiety, antidepressant).  Problem Solving Problem Solving: 5-Solves complex 90% of the time/cues < 10% of the  time  Memory Memory: 6-More than reasonable amt of time  Medical Problem List and Plan: 1. 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 DVT Prophylaxis/Anticoagulation: SQ Lovenox .Monitor platelet and any signs of bleeding 3. Pain Management: Tylenol 4. Dysphagia. D2 nectar thick liquids. Follow-up speech therapy.  continue low rate IVF HS until liquids advanced 5. Neuropsych: This patient is capable of making decisions on her own behalf. 6. Skin/Wound Care: Routine skin checks 7. Fluids/Electrolytes/Nutrition: Strict I & O.Follow up labs 8.Hypertension. No present antihypertensive medication. Patient on Toprol 50 mg daily prior to admission as well as Tribenzor 40-5-12.5 mg daily., start amlodipine to slowly start bringing down systolic BP  -resumed toprol at 25mg  daily 9.diabetes mellitus with peripheral neuropathy. Patient with hemoglobin A1c 8.9. Sliding scale insulin. Check blood sugars before meals and at bedtime. Patient on Glucophage 500 mg daily as per home regimen--  -diabetic education  -some improvement with glucophage----add pm dose,Improving 10.Hyperlipidemia.lipitor 11.COPD/tobacco abuse.Counseling 12.HX Bilat. breast cancer.Follow up oncology out patient 13.  Hypokalemia- may be IV related now on po K   LOS (Days) 11 A FACE TO FACE EVALUATION WAS PERFORMED  KIRSTEINS,ANDREW E 10/12/2014, 6:40 AM

## 2014-10-12 NOTE — Progress Notes (Signed)
Physical Therapy Vestibular Assessment and Plan  Patient Details  Name: Madison Todd MRN: [id number] Date of Birth: 1951-06-26  PT Diagnosis: Abnormality of gait, Ataxia, Coordination disorder, Difficulty walking, Dizziness and giddiness and Vertigo of central origin  Today's Date: 10/12/2014 PT Individual Time: 1030-1200 PT Individual Time Calculation (min): 90 min    Problem List:  Patient Active Problem List   Diagnosis Date Noted  . Dysphagia S/P CVA (cerebrovascular accident) 10/10/2014  . Type 2 diabetes mellitus not at goal 10/05/2014  . 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 Lacunar infarction 10/01/2014  . 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 CVA (cerebral infarction) 09/28/2014  . TIA (transient ischemic attack) 09/27/2014  . HTN (hypertension) 09/27/2014  . Breast cancer, right breast 12/16/2013  . 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 Adenomatous polyp 10/02/2013  . 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 Cardiomyopathy 07/22/2011  . COPD (chronic obstructive pulmonary disease) 07/22/2011    Past Medical History:  Past Medical History  Diagnosis Date  . Leukocytoclastic vasculitis   . Diabetes mellitus   . Breast cancer     left 2007/right 1997  . Left-sided Breast cancer 07/22/2011  . Cardiomyopathy 07/22/2011  . COPD (chronic obstructive pulmonary disease) 07/22/2011  . Hypertension   . Hyperlipidemia   . 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  Stage II (T2 N0).  ER 73%, PR 90%, Her2 positive, Ki-67 19%.  2.1 cm in size.  Agreed to only have radiation, Herceptin, and Aromasin.  Right sided breast cancer: Stage II (T2 N0 M0).  2.5 cm in size.  Surgery on 04/28/1996 with axillary dissection on 05/10/1996.  11 negative nodes.  Treated with AC x 4 cycles for ER negative, PR positive at only 20%.    . Breast cancer, right breast 12/16/2013    Past Surgical History:  Past Surgical History  Procedure Laterality Date  . Mastectomy, radical      right  . Breast lumpectomy      left  . Port-a-cath removal    . Colonoscopy  07/20/2005    RMR: Diminutive ascending colon polyp, status post resection . Inflamed focally adenomatous polyp  . Colonoscopy N/A 10/05/2013    RMR: Multiple rectal and colonic polyps-removed/treated. 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 other polyps tubular adenomas  . Flexible sigmoidoscopy N/A 10/11/2013    RMR: rectal polypectomy site identified, assitional resection and tatooing performed. 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 Flexible sigmoidoscopy N/A 11/24/2013    Procedure: FLEXIBLE SIGMOIDOSCOPY;  Surgeon: Daneil Dolin, MD;  Location: [facility];  Service: Endoscopy;  Laterality: N/A;  1:00-moved to [place] notified pt    Assessment & Plan Subjective:  Pt denies true vertigo at rest in sitting or supine but does report dysequilibrium in sitting and standing.  Pt did report feeling "lightheaded" during supine > sit on L and R, during head turns and head pitches in unsupported sitting and with turning 180 deg which was mitigated by returning to supine or supported sitting position.  Pt also reporting diplopia; able to converge close targets but increased difficulty with convergence for targets further away during binocular vision.  Pt with abnormal convergence in R eye but diplopia resolved with one eye covered.  Strength and coordination also tested with strength WFL except R hip flexion 4-/5.  Pt with mild ataxia  and overshooting RUE finger to nose and heel-shin tests.  Pt tolerated vestibular evaluation well with no c/o nausea but did c/o fatigue at end of session.  Pt educated on proper use of eye patch and importance of switching eyes every two hours.  Pt left in w/c with lunch set up in front of her and all other items within reach.        Eye Alignment   Abnormal; deviates down and to the R  Spontaneous  Nystagmus Not observed  Gaze holding nystagmus For Binocular: positive in R eye with L and R gaze and downward gaze; more pronounced with L eye blocked with patch  Smooth pursuit Impaired in R eye in all directions  VOR cancellation Abnormal in R eye; more pronounced with L eye blocked  Saccades Impaired in R eye; overshooting midline to R  VOR slow Unable to suppress  Head Thrust Test N/T  Head Shaking Nystagmus N/T  Rt. Hallpike Dix N/T  Lt. Hallpike Dix N/T  Rt. Roll Test N/T  Lt. Roll Test  N/T  Motion sensitivity Modified; did not perform Hall-Pike.  Did report symptoms during supine > sit, head turns, head pitches and 180 deg turn to L.    Rhomberg Unable to hold position without UE support   Findings: Pt presents with the following impairments: central vestibular dysfunction and impaired VOR, impaired vision/diplopia, motion sensitivity, dizziness, impaired coordination/ataxia, abnormal gait and impaired balance/midline orientation.       Recommendations:  Recommending a combination of habituation exercises as well as compensatory techniques.  Recommending use of targeting for supine > sit, sit <> stand, and gait if one eye is occluded with patch initially (with binocular vision pt experiences diplopia with distant targets); also recommending that pt perform sit <> stand with binocular vision fixed on target 2 ft in front of patient with solid background initially to increase orientation to midline and progressively move target backwards and to busier background.    For convergence: recommending pt practice with binocular vision as well as R monocular with gaze fixed on close target; progressively move target further away from patient and have pt practice converging target at various distances. Begin in sitting and progress to convergence at various distance in standing.  Once pt is able to maintain standing balance in midline with gaze  converged and fixed on target begin more dynamic movements such as marching in place with UE support.  For gaze stabilization: begin x 1 viewing in unsupported sitting with horizontal and vertical head turns for 30 seconds each but do no let patient's symptoms exceed 4/10 severity.  Begin with target close to patient and progressively move target away; begin with solid background and progress to busy background.   Progress from unsupported sitting>> static standing with UE support >> marching in place.   Attempt to incorporate saccades and smooth pursuit into scanning for reading and other activities.     PT Evaluation General   Vital Signs Pain Pain Assessment Pain Assessment: No/denies pain Balance Standardized Balance Assessment Standardized Balance Assessment: Vestibular Evaluation  FIM:  FIM - Bed/Chair Transfer Bed/Chair Transfer Assistive Devices: Bed rails;Arm rests Bed/Chair Transfer: 4: Supine > Sit: Min A (steadying Pt. > 75%/lift 1 leg);5: Sit > Supine: Supervision (verbal cues/safety issues);4: Bed > Chair or W/C: Min A (steadying Pt. > 75%);4: Chair or W/C > Bed: Min A (steadying Pt. > 75%) FIM - Locomotion: Wheelchair Locomotion: Wheelchair: 0: Activity did not occur FIM - Locomotion: Ambulation Locomotion: Ambulation:  0: Activity did not occur FIM - Locomotion: Stairs Locomotion: Stairs: 0: Activity did not occur   Refer to Care Plan for Long Term Goals  Recommendations:   Malachy Mood 10/12/2014, 12:10 PM

## 2014-10-12 NOTE — Progress Notes (Signed)
No urine output charted since 0830 am on 10/12/2014. Pt is A&O x4. Asked her when was the last time she went to the restroom and she said "about 7:00 tonight". Pt said she does not have to go to the bathroom and said she will call when she needs to go. Will continue to monitor. Kennieth Francois, RN

## 2014-10-13 ENCOUNTER — Inpatient Hospital Stay (HOSPITAL_COMMUNITY): Payer: Commercial Managed Care - HMO | Admitting: *Deleted

## 2014-10-13 DIAGNOSIS — G464 Cerebellar stroke syndrome: Secondary | ICD-10-CM

## 2014-10-13 LAB — GLUCOSE, CAPILLARY
GLUCOSE-CAPILLARY: 188 mg/dL — AB (ref 70–99)
GLUCOSE-CAPILLARY: 170 mg/dL — AB (ref 70–99)
Glucose-Capillary: 154 mg/dL — ABNORMAL HIGH (ref 70–99)
Glucose-Capillary: 123 mg/dL — ABNORMAL HIGH (ref 70–99)

## 2014-10-13 MED ORDER — AMLODIPINE BESYLATE 10 MG PO TABS
10.0000 mg | ORAL_TABLET | Freq: Every day | ORAL | Status: DC
  Administered 2014-10-14 – 2014-10-23 (×10): 10 mg via ORAL
  Filled 2014-10-13 (×11): qty 1

## 2014-10-13 MED ORDER — LOSARTAN POTASSIUM 50 MG PO TABS
50.0000 mg | ORAL_TABLET | Freq: Every day | ORAL | Status: DC
  Filled 2014-10-13 (×2): qty 1

## 2014-10-13 MED ORDER — ASPIRIN 81 MG PO CHEW
81.0000 mg | CHEWABLE_TABLET | Freq: Every day | ORAL | Status: DC
  Administered 2014-10-13 – 2014-10-23 (×11): 81 mg via ORAL
  Filled 2014-10-13 (×12): qty 1

## 2014-10-13 MED ORDER — METOPROLOL TARTRATE 12.5 MG HALF TABLET
12.5000 mg | ORAL_TABLET | Freq: Two times a day (BID) | ORAL | Status: DC
  Administered 2014-10-13 – 2014-10-23 (×21): 12.5 mg via ORAL
  Filled 2014-10-13 (×23): qty 1

## 2014-10-13 NOTE — Progress Notes (Signed)
Madison Todd is a 64 y.o. female [birth year] [id number]  Subjective: Feeling reasonable well. Still dizzy, worse with movement. Double vision at distance. Sitting at sink for wash  Objective: Vital signs in last 24 hours: Temp:  [97.9 F (36.6 C)-98.1 F (36.7 C)] 98.1 F (36.7 C) (01/30 0520) Pulse Rate:  [62-73] 62 (01/30 0520) Resp:  [18] 18 (01/30 0520) BP: (128-157)/(82-87) 157/82 mmHg (01/30 0520) SpO2:  [96 %-98 %] 96 % (01/30 0520) Weight change:  Last BM Date: 10/12/14  Intake/Output from previous day: 01/29 0701 - 01/30 0700 In: 1560 [P.O.:1560] Out: 101 [Urine:100; Stool:1]  Physical Exam General: No apparent distress  Pleasant, sitting at sink to bath  Lungs: Normal effort. Lungs clear to auscultation, no crackles or wheezes. Cardiovascular: Regular rate and rhythm, no edema Neurological: No new neurological deficits Wounds: N/A      Lab Results: BMET    Component Value Date/Time   NA 142 10/02/2014 0545   K 3.3* 10/02/2014 0545   CL 108 10/02/2014 0545   CO2 23 10/02/2014 0545   GLUCOSE 142* 10/02/2014 0545   BUN 14 10/02/2014 0545   CREATININE 0.65 10/02/2014 0545   CALCIUM 9.0 10/02/2014 0545   GFRNONAA >90 10/02/2014 0545   GFRAA >90 10/02/2014 0545   CBC    Component Value Date/Time   WBC 10.6* 10/02/2014 0545   RBC 5.04 10/02/2014 0545   HGB 14.5 10/02/2014 0545   HCT 44.4 10/02/2014 0545   PLT 272 10/02/2014 0545   MCV 88.1 10/02/2014 0545   MCH 28.8 10/02/2014 0545   MCHC 32.7 10/02/2014 0545   RDW 14.2 10/02/2014 0545   LYMPHSABS 2.4 10/02/2014 0545   MONOABS 0.8 10/02/2014 0545   EOSABS 0.1 10/02/2014 0545   BASOSABS 0.0 10/02/2014 0545   CBG's (last 3):   Recent Labs  10/12/14 1631 10/12/14 2035 10/13/14 0645  GLUCAP 154* 162* 154*   LFT's Lab Results  Component Value Date   ALT 18 10/02/2014   AST 19 10/02/2014   ALKPHOS 104 10/02/2014   BILITOT 0.7 10/02/2014    Studies/Results: No results  found.  Medications:  I have reviewed the patient's current medications. Scheduled Medications: . amLODipine  5 mg Oral Daily  . antiseptic oral rinse  7 mL Mouth Rinse BID  . aspirin EC  81 mg Oral Daily  . atorvastatin  20 mg Oral q1800  . enoxaparin (LOVENOX) injection  40 mg Subcutaneous Q24H  . gabapentin  300 mg Oral QHS  . insulin aspart  0-15 Units Subcutaneous TID WC  . metFORMIN  500 mg Oral BID WC  . metoprolol succinate  25 mg Oral Daily  . potassium chloride  20 mEq Oral Daily  . 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 Functional deficits secondary to acute lacunar infarct involving the right medullary area -Wallenburg syndrome 2. DVT Prophylaxis/Anticoagulation: SQ Lovenox . Monitor platelet and any signs of bleeding 3. Pain Management: Tylenol 4. Dysphagia. D2 nectar thick liquids. Follow-up speech therapy. not requiring IVF HS as po intake >1000 - ok to DC PIV and IVF HS 5. Neuropsych: This patient is capable of making decisions  on her own behalf. 6. Skin/Wound Care: Routine skin checks 7. Fluids/Electrolytes/Nutrition: Strict I & O.Follow up labs 8.Hypertension. PTA on Toprol XL 50 mg daily, Tribenzor 40-5-12.5 mg daily. Here on amlodipine 5 qd and toprol 25mg  daily, given pulse 60s, will resume ARB rather than increasing to home dose beta-blocker  9.diabetes mellitus with peripheral neuropathy. Patient with hemoglobin A1c 8.9. Sliding scale insulin. Check blood sugars before meals and at bedtime. Patient on Glucophage 500 mg daily as per home regimen-- -diabetic  education 10.Hyperlipidemia. rx lipitor 11.COPD/tobacco abuse.Counseling 12.HX Bilat. breast cancer. Follow up oncology out patient 13. Hypokalemia- replacing po -recheck Mon  Length of stay, days: 12    Valerie A. Asa Lente, MD 10/13/2014, 9:33 AM

## 2014-10-13 NOTE — Progress Notes (Signed)
Physical Therapy Session Note  Patient Details  Name: Madison Todd MRN: [id number] Date of Birth: March 08, 1951  Today's Date: 10/13/2014 PT Individual Time: 0900-1000 PT Individual Time Calculation (min): 60 min   Short Term Goals: Week 2:  PT Short Term Goal 1 (Week 2): Pt will perform transfers safely with S to R/L. PT Short Term Goal 2 (Week 2): Pt will ambulate 50' with Mod A of 1 with LRAD. PT Short Term Goal 3 (Week 2): Pt will reach 10' outside BOS and self-correct for midline orientation during functional household tasks to R/L. PT Short Term Goal 4 (Week 2): Pt will perform 5 stairs with Min A using bil rails PT Short Term Goal 5 (Week 2): Pt will propel w/c 150', managing all parts, with Mod I.   Skilled Therapeutic Interventions/Progress Updates:     Pt was received in room, seated in w/c after breakfast. Pt denies c/o headache this morning, but she did have one last night. Pt reports that her sense of dizziness is 4/10 at rest, seated in w/c  Pt propelled w/c to therapy gym >150', with S and min cues to safely navigating door frames, turns, and w/c parts.  Neuromuscular reeducation via multimodal cueing for L weight acceptance and midline orientation: - x1 viewing exercises in sitting with simple visual background, using both star and "A" at 12 inches from eyes, 3 x 30 seconds - gaze stability exercise using A for horizontal target-finding R/L 3 x 30 seconds  - Pt reported that VOR exercises increased her level of dizziness to 5/10, and that gaze-holding on a near target takes her dizziness down to a 4/10. - Kinetron LE stepping exercises x 5 min with cues for L weight shift and rhythmic stepping for LE strength and coordination - 5 stairs with bil rails, step-to pattern, requiring mod cues for foot placement, L weight shift, and reminders for sequence/technique; Min A for steadying assist, consistent cueing needed. - First gait trial with Harmon Pier walker x 28', heel switch  on LLE for auditory feedback on L weight shift, and shoe lift on RLE to bias weight shift to L; Mod A to drive walker and consistent cues for L weight shift - Second gait trial with bil elbow support by PT x 28', also using L heel switch and R heel lift; Mod A to facilitate L weight shift and cues to correctly position RLE, which often adducts and reduces her BOS.  Pt was propelled to room and was received by RN for bath.  Pt education on energy conservation technique and using pursed lip breathing during rest breaks. Pt also educated on using gaze stabilization to diminish dizziness during mobility.  Pt continues to require consistent cueing and reminders for L weight shift and technique for stairs, as well as reminders for w/c management and safety during transfers. Some mental delays noted today, due to ongoing need for reminders for techniques that we have practiced consistently throughout the week.  Plan to use Lite Gait on Tues for gait training.  Therapy Documentation Precautions:  Precautions Precautions: Fall Precaution Comments: Rt lateral lean, double vision Restrictions Weight Bearing Restrictions: No  See FIM for current functional status  Therapy/Group: Individual Therapy  Blenda Mounts 10/13/2014, 9:38 AM

## 2014-10-14 ENCOUNTER — Inpatient Hospital Stay (HOSPITAL_COMMUNITY): Payer: Commercial Managed Care - HMO

## 2014-10-14 LAB — GLUCOSE, CAPILLARY
GLUCOSE-CAPILLARY: 145 mg/dL — AB (ref 70–99)
Glucose-Capillary: 137 mg/dL — ABNORMAL HIGH (ref 70–99)
Glucose-Capillary: 155 mg/dL — ABNORMAL HIGH (ref 70–99)
Glucose-Capillary: 196 mg/dL — ABNORMAL HIGH (ref 70–99)

## 2014-10-14 NOTE — Progress Notes (Signed)
Physical Therapy Session Note  Patient Details  Name: Madison Todd MRN: [id number] Date of Birth: 1951-08-08  Today's Date: 10/14/2014 PT Individual Time: 1530-1600 PT Individual Time Calculation (min): 30 min   Short Term Goals: Week 2:  PT Short Term Goal 1 (Week 2): Pt will perform transfers safely with S to R/L. PT Short Term Goal 2 (Week 2): Pt will ambulate 32' with Mod A of 1 with LRAD. PT Short Term Goal 3 (Week 2): Pt will reach 10' outside BOS and self-correct for midline orientation during functional household tasks to R/L. PT Short Term Goal 4 (Week 2): Pt will perform 5 stairs with Min A using bil rails PT Short Term Goal 5 (Week 2): Pt will propel w/c 150', managing all parts, with Mod I.   Skilled Therapeutic Interventions/Progress Updates:    Pt received supine in bed, agreeable to participate in therapy. Session focused on vestibular exercises. Pt moved supine<>sit w/ supervision. At edge of bed introduced pt to x1 VOR exercises. Pt provided with visual target initially at 6' away and instructed to maintain focus on target while moving head slowly L and R, then up and down. Pt able to tolerate exercises for approximately 30-45 seconds at a time, noted decreased cervical rotation to L with target at 6', noted that pt had difficulty abducting R eye to maintain focus but showed good adduction while turning to R. With target moved further away (approximately 9') pt demonstrated decreased cervical rotation to L and to R and often had to stop due to diplopia, pt could tolerate smaller movements for up to 30 seconds at a time. Pt's dizziness rated at 3-4/10 throughout exercise. Pt tolerated exercises well. Pt left supine in bed w/ all needs within reach.   Therapy Documentation Precautions:  Precautions Precautions: Fall Precaution Comments: Rt lateral lean, double vision Restrictions Weight Bearing Restrictions: No Pain:  Reported some HA pain when turning to L, pt  allowed to rest  See FIM for current functional status  Therapy/Group: Individual Therapy  Rada Hay 10/14/2014, 4:24 PM

## 2014-10-14 NOTE — Progress Notes (Signed)
Madison Todd is a 64 y.o. female Aug 15, 1951 [id number]  Subjective: Feeling well, yet anxious to help get home finance issues "straight" (needs to add family member to checking acct authorization). Feels distracted by same. Still dizzy, worse with movement. Discussed need for BP and DM control to prevent further CVA events  Objective: Vital signs in last 24 hours: Temp:  [97.8 F (36.6 C)-98.1 F (36.7 C)] 97.8 F (36.6 C) (01/31 0511) Pulse Rate:  [63-70] 69 (01/31 0754) Resp:  [18] 18 (01/31 0511) BP: (143-164)/(71-87) 144/71 mmHg (01/31 0754) SpO2:  [97 %-98 %] 97 % (01/31 0511) Weight change:  Last BM Date: 10/13/14  Intake/Output from previous day: 01/30 0701 - 01/31 0700 In: 840 [P.O.:840] Out: -   Physical Exam General: No apparent distress  Pleasant, lying on L side in bed Lungs: Normal effort. Lungs clear to auscultation, no crackles or wheezes. Cardiovascular: Regular rate and rhythm, no edema Neurological: No new neurological deficits Wounds: N/A      Lab Results: BMET    Component Value Date/Time   NA 142 10/02/2014 0545   K 3.3* 10/02/2014 0545   CL 108 10/02/2014 0545   CO2 23 10/02/2014 0545   GLUCOSE 142* 10/02/2014 0545   BUN 14 10/02/2014 0545   CREATININE 0.65 10/02/2014 0545   CALCIUM 9.0 10/02/2014 0545   GFRNONAA >90 10/02/2014 0545   GFRAA >90 10/02/2014 0545   CBC    Component Value Date/Time   WBC 10.6* 10/02/2014 0545   RBC 5.04 10/02/2014 0545   HGB 14.5 10/02/2014 0545   HCT 44.4 10/02/2014 0545   PLT 272 10/02/2014 0545   MCV 88.1 10/02/2014 0545   MCH 28.8 10/02/2014 0545   MCHC 32.7 10/02/2014 0545   RDW 14.2 10/02/2014 0545   LYMPHSABS 2.4 10/02/2014 0545   MONOABS 0.8 10/02/2014 0545   EOSABS 0.1 10/02/2014 0545   BASOSABS 0.0 10/02/2014 0545   CBG's (last 3):    Recent Labs  10/13/14 1633 10/13/14 2201 10/14/14 0702  GLUCAP 123* 170* 137*   LFT's Lab Results  Component Value Date   ALT 18 10/02/2014    AST 19 10/02/2014   ALKPHOS 104 10/02/2014   BILITOT 0.7 10/02/2014    Studies/Results: No results found.  Medications:  I have reviewed the patient's current medications. Scheduled Medications: . amLODipine  10 mg Oral Daily  . antiseptic oral rinse  7 mL Mouth Rinse BID  . aspirin  81 mg Oral Daily  . atorvastatin  20 mg Oral q1800  . enoxaparin (LOVENOX) injection  40 mg Subcutaneous Q24H  . gabapentin  300 mg Oral QHS  . insulin aspart  0-15 Units Subcutaneous TID WC  . metFORMIN  500 mg Oral BID WC  . metoprolol tartrate  12.5 mg Oral BID  . potassium chloride  20 mEq Oral Daily  . 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 Functional deficits secondary to acute lacunar infarct involving the right medullary area -Wallenburg syndrome 2. DVT Prophylaxis/Anticoagulation: SQ Lovenox . Monitor platelet and any signs of bleeding 3. Pain Management: Tylenol 4. Dysphagia. D2 nectar thick liquids. Follow-up speech therapy. DC's IVF  HS and PIV on 1/30 as as po intake >1000cc 5. Neuropsych: This patient is capable of making decisions on her own behalf. 6. Skin/Wound Care: Routine skin checks 7. Fluids/Electrolytes/Nutrition: Strict I & O.Follow up labs 8.Hypertension. PTA on Toprol XL 50 mg daily, Tribenzor 40-5-12.5 mg daily. Did not resume ARB (component of Tribenzor) due to hx angioedema with ACEI and unable to verify compliance with Tribenzor PTA. Therefore, increased amlodipine to max dose and continued toprol 25mg  daily, given pulse 60s (to avoid increasing to home dose beta-blocker ) 9.diabetes  mellitus with peripheral neuropathy. Patient with hemoglobin A1c 8.9. Sliding scale insulin. Check blood sugars before meals and at bedtime. Patient on Glucophage 500 mg daily per home regimen-- now on BID -diabetic education 10.Hyperlipidemia. rx lipitor 11.COPD/tobacco abuse. Counseling 12.HX Bilat. breast cancer. Follow up oncology out patient 13. Hypokalemia- replacing po -recheck Mon 2/1-holding diuretic  Length of stay, days: 13    Valerie A. Asa Lente, MD 10/14/2014, 9:54 AM

## 2014-10-15 ENCOUNTER — Inpatient Hospital Stay (HOSPITAL_COMMUNITY): Payer: Commercial Managed Care - HMO | Admitting: Speech Pathology

## 2014-10-15 ENCOUNTER — Inpatient Hospital Stay (HOSPITAL_COMMUNITY): Payer: Commercial Managed Care - HMO

## 2014-10-15 ENCOUNTER — Inpatient Hospital Stay (HOSPITAL_COMMUNITY): Payer: Commercial Managed Care - HMO | Admitting: Occupational Therapy

## 2014-10-15 LAB — GLUCOSE, CAPILLARY
GLUCOSE-CAPILLARY: 152 mg/dL — AB (ref 70–99)
Glucose-Capillary: 124 mg/dL — ABNORMAL HIGH (ref 70–99)
Glucose-Capillary: 142 mg/dL — ABNORMAL HIGH (ref 70–99)
Glucose-Capillary: 201 mg/dL — ABNORMAL HIGH (ref 70–99)

## 2014-10-15 NOTE — Progress Notes (Signed)
Subjective/Complaints: No new issues overnight, patient participating in therapy. Per PT still has problems with weight shifting.    Review of Systems - Negative except swallowing and balance problems Objective: Vital Signs: Blood pressure 145/87, pulse 68, temperature 98.7 F (37.1 C), temperature source Oral, resp. rate 16, weight 69 kg (152 lb 1.9 oz), SpO2 94 %. No results found. Results for orders placed or performed during the hospital encounter of 10/01/14 (from the past 72 hour(s))  Glucose, capillary     Status: Abnormal   Collection Time: 10/12/14  4:31 PM  Result Value Ref Range   Glucose-Capillary 154 (H) 70 - 99 mg/dL  Glucose, capillary     Status: Abnormal   Collection Time: 10/12/14  8:35 PM  Result Value Ref Range   Glucose-Capillary 162 (H) 70 - 99 mg/dL  Glucose, capillary     Status: Abnormal   Collection Time: 10/13/14  6:45 AM  Result Value Ref Range   Glucose-Capillary 154 (H) 70 - 99 mg/dL  Glucose, capillary     Status: Abnormal   Collection Time: 10/13/14 11:52 AM  Result Value Ref Range   Glucose-Capillary 188 (H) 70 - 99 mg/dL   Comment 1 Notify RN   Glucose, capillary     Status: Abnormal   Collection Time: 10/13/14  4:33 PM  Result Value Ref Range   Glucose-Capillary 123 (H) 70 - 99 mg/dL   Comment 1 Documented in Chart   Glucose, capillary     Status: Abnormal   Collection Time: 10/13/14 10:01 PM  Result Value Ref Range   Glucose-Capillary 170 (H) 70 - 99 mg/dL  Glucose, capillary     Status: Abnormal   Collection Time: 10/14/14  7:02 AM  Result Value Ref Range   Glucose-Capillary 137 (H) 70 - 99 mg/dL  Glucose, capillary     Status: Abnormal   Collection Time: 10/14/14 11:34 AM  Result Value Ref Range   Glucose-Capillary 145 (H) 70 - 99 mg/dL  Glucose, capillary     Status: Abnormal   Collection Time: 10/14/14  4:33 PM  Result Value Ref Range   Glucose-Capillary 155 (H) 70 - 99 mg/dL  Glucose, capillary     Status: Abnormal   Collection Time: 10/14/14  9:22 PM  Result Value Ref Range   Glucose-Capillary 196 (H) 70 - 99 mg/dL  Glucose, capillary     Status: Abnormal   Collection Time: 10/15/14  7:03 AM  Result Value Ref Range   Glucose-Capillary 152 (H) 70 - 99 mg/dL     HEENT: normal Cardio: RRR and no murmur Resp: CTA B/L and difficulty clearing secretions GI: BS positive and NT, ND Extremity:  No Edema Skin:   Intact Neuro: Alert/Oriented, Cranial Nerve Abnormalities R V1,2,3 sensory, Abnormal Sensory reduced in LUE and LLE, Abnormal FMC Ataxic/ dec FMC and Other Right lateral pulsion during sitting Musc/Skel:  Normal Gen NAD, voice quality hoarse   Assessment/Plan: 1. 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 Physiatrist is providing close team supervision and 24 hour management of active medical problems listed below. Physiatrist and rehab team continue to assess barriers to discharge/monitor patient progress toward functional and medical goals. FIM: FIM - Bathing Bathing Steps Patient Completed: Chest, Right Arm, Left Arm, Abdomen, Front perineal area, Buttocks, Right upper leg, Left upper leg, Right lower leg (including foot), Left lower leg (including foot) Bathing: 4: Steadying  assist  FIM - Upper Body Dressing/Undressing Upper body dressing/undressing steps patient completed: Thread/unthread right sleeve of pullover shirt/dresss, Thread/unthread left sleeve of pullover shirt/dress, Pull shirt over trunk, Put head through opening of pull over shirt/dress Upper body dressing/undressing: 5: Set-up assist to: Obtain clothing/put away FIM - Lower Body Dressing/Undressing Lower body dressing/undressing steps patient completed: Thread/unthread right pants leg, Thread/unthread left pants leg, Pull pants up/down, Don/Doff right sock, Don/Doff left sock,  Thread/unthread right underwear leg, Thread/unthread left underwear leg, Pull underwear up/down, Don/Doff right shoe, Don/Doff left shoe, Fasten/unfasten right shoe, Fasten/unfasten left shoe Lower body dressing/undressing: 4: Steadying Assist  FIM - Toileting Toileting steps completed by patient: Adjust clothing prior to toileting, Performs perineal hygiene, Adjust clothing after toileting Toileting Assistive Devices: Grab bar or rail for support Toileting: 4: Steadying assist  FIM - Radio producer Devices: Grab bars Toilet Transfers: 4-To toilet/BSC: Min A (steadying Pt. > 75%), 4-From toilet/BSC: Min A (steadying Pt. > 75%)  FIM - Bed/Chair Transfer Bed/Chair Transfer Assistive Devices: Arm rests Bed/Chair Transfer: 4: Chair or W/C > Bed: Min A (steadying Pt. > 75%)  FIM - Locomotion: Wheelchair Distance: 150 Locomotion: Wheelchair: 5: Travels 150 ft or more: maneuvers on rugs and over door sills with supervision, cueing or coaxing FIM - Locomotion: Ambulation Locomotion: Ambulation Assistive Devices: Nutritional therapist, Other (comment) (R shoe lift, L heel switch) Ambulation/Gait Assistance: 3: Mod assist Locomotion: Ambulation: 2: Travels 50 - 149 ft with moderate assistance (Pt: 50 - 74%)  Comprehension Comprehension Mode: Auditory Comprehension: 5-Understands complex 90% of the time/Cues < 10% of the time  Expression Expression Mode: Verbal Expression: 5-Expresses complex 90% of the time/cues < 10% of the time  Social Interaction Social Interaction: 6-Interacts appropriately with others with medication or extra time (anti-anxiety, antidepressant).  Problem Solving Problem Solving: 4-Solves basic 75 - 89% of the time/requires cueing 10 - 24% of the time  Memory Memory: 5-Recognizes or recalls 90% of the time/requires cueing < 10% of the time  Medical Problem List and Plan: 1. Functional deficits secondary to acute lacunar infarct involving the  right medullary area 2. DVT Prophylaxis/Anticoagulation: SQ Lovenox .Monitor platelet and any signs of bleeding 3. Pain Management: Tylenol 4. Dysphagia. D2 nectar thick liquids. Follow-up speech therapy.  continue low rate IVF HS until liquids advanced 5. Neuropsych: This patient is capable of making decisions on her own behalf. 6. Skin/Wound Care: Routine skin checks 7. Fluids/Electrolytes/Nutrition: Strict I & O.Follow up labs 8.Hypertension. No present antihypertensive medication. Patient on Toprol 50 mg daily prior to admission as well as Tribenzor 40-5-12.5 mg daily., start amlodipine to slowly start bringing down systolic BP  -resumed toprol at 25mg  daily 9.diabetes mellitus with peripheral neuropathy. Patient with hemoglobin A1c 8.9. Sliding scale insulin. Check blood sugars before meals and at bedtime. Patient on Glucophage 500 mg daily as per home regimen--  -diabetic education  -some improvement with glucophage----add pm dose,Improving 10.Hyperlipidemia.lipitor 11.COPD/tobacco abuse.Counseling 12.HX Bilat. breast cancer.Follow up oncology out patient 13.  Hypokalemia- may be IV related now on po K   LOS (Days) 14 A FACE TO FACE EVALUATION WAS PERFORMED  Madison Todd E 10/15/2014, 11:10 AM

## 2014-10-15 NOTE — Progress Notes (Signed)
Patient resting quietly in bed at this time, eyes closed, resp easy and regular. AOX4.For HS med pass,pt maneuvers self to side of bed for medication, takes meds crushed in applesauce. Poor trunk control noted. On water protocol, drank approx 28ml thin liquid during evening med pass, minimal coughing noted. Patient verbalized need to take small sips, turning head to right to swallow. Lung sounds diminished, expiratory wheezes noted to bilat upper lobes. Occasinal cough noted, uses yankauer suction per self. Minimal amount thin, white, clear secretions.  Continent of bladder, up to BSC, X1 min assist, several times throughout night. Abdomen SNT, +BSX4, reports BM 10/14/13. Uses call light appropriately, bed alarms on, bed in low position. Lonell Face, RN

## 2014-10-15 NOTE — Progress Notes (Signed)
Physical Therapy Session Note  Patient Details  Name: Madison Todd MRN: [id number] Date of Birth: 03-09-1951  Today's Date: 10/15/2014 PT Individual Time: 9166-0600 PT Individual Time Calculation (min): 45 min   Short Term Goals: Week 2:  PT Short Term Goal 1 (Week 2): Pt will perform transfers safely with S to R/L. PT Short Term Goal 2 (Week 2): Pt will ambulate 1' with Mod A of 1 with LRAD. PT Short Term Goal 3 (Week 2): Pt will reach 10' outside BOS and self-correct for midline orientation during functional household tasks to R/L. PT Short Term Goal 4 (Week 2): Pt will perform 5 stairs with Min A using bil rails PT Short Term Goal 5 (Week 2): Pt will propel w/c 150', managing all parts, with Mod I.     Skilled Therapeutic Interventions/Progress Updates:   Therapeutic activities: simulated car transfer x 2, with close supervision, mod cues for head- hips relationship. Toilet transfer in room with supervision.  See FIM.  neuromuscular re-education via forced use, auditory feedback of L foot switch, VCs, manual cues, visual cues for midline orientation and LLE wt shifting: -up/down flight of 10 steps, L rail only, step pattern, mod cues for foot position, wt shifting, sequencing with min assist throughout except for 1 LOB forward at top of stairs -eye gaze stabilization during sit>< stand while focusing on "A" on wall.  Pt progressed from "A" being 2'>3'>4'  in front of her, x 2 reps each. Dizziness rated 3/10 before and throughout exs. Initially, pt had LOB forward upon standing, requiring min assist to correct; R shoe 1/2" build -up added and pt no longer over-shifted forward.  Son Zenaida Deed observed stairs and gaze visualization exs, and was trained in simulated car transfer to sedan height seat, folding and transporting w/c.  W/c propulsion x 150' without leg rests : modified independent on level tile.  For distances in home and controlled of 150' or less, pt is able to hold feet up  or use them occasionally for propulsion. Vision deficits make it difficult for pt to manipulate leg rests on/off w/c.    Therapy Documentation Precautions:  Precautions Precautions: Fall Precaution Comments: Rt lateral lean, double vision Restrictions Weight Bearing Restrictions: No    See FIM for current functional status  Therapy/Group: Individual Therapy  Carla Whilden 10/15/2014, 3:22 PM

## 2014-10-15 NOTE — Progress Notes (Signed)
Physical Therapy Session Note  Patient Details  Name: Madison Todd MRN: [id number] Date of Birth: 04-Mar-1951  Today's Date: 10/15/2014 PT Individual Time: 0800-0900 PT Individual Time Calculation (min): 60 min   Short Term Goals: Week 2:  PT Short Term Goal 1 (Week 2): Pt will perform transfers safely with S to R/L. PT Short Term Goal 2 (Week 2): Pt will ambulate 19' with Mod A of 1 with LRAD. PT Short Term Goal 3 (Week 2): Pt will reach 10' outside BOS and self-correct for midline orientation during functional household tasks to R/L. PT Short Term Goal 4 (Week 2): Pt will perform 5 stairs with Min A using bil rails PT Short Term Goal 5 (Week 2): Pt will propel w/c 150', managing all parts, with Mod I.   Skilled Therapeutic Interventions/Progress Updates:     Pt was received seated in w/c, donning shoes. Pt reports that her level of dizziness is 3/10 while seated in w/c. No c/o pain or paresthesias today, and pt reports that she slept "a little bit."  Emphasis today on using auditory cue via heel switch to encourage L weight-bearing during standing mobility.   Therapeutic activity via task-specific training for functional mobility and household tasks - transfer R/L from w/c > NuStep; Min A - during stair training, pt had 1 LOB during transitional movement/turning around to L  Neuromusuclar reeducation techniques via multimodal cueing, forced use, and task-specific training to promote midline orientation: - Pt prop w/c > 150' using BUEs and intermittent BLEs; pt requires min cues for seated midline/L weight shift - NuStep for dynamic seated balance exercise: Pt used BUEs & BLEs x 5 min, R arm crossed over chest & BLEs x 5 min; Pt requires intermittent cueing and reminders to use gaze holding technique & L weight shift in order to reach midline balance; after exercise, pt reports 4/10 on dizziness scale, 13 RPE "somewhat hard". - Gait with Ethelene Hal x 38' with Mod A, R shoe lift in  order to bias weight to L and L heel switch as an auditory cue to reinforce L weight acceptance; Mod A for steadying/driving walker;   - VCs needed for L weight shift and visual target for midline orientation - L Single Limb Stance x 15 seconds using Ethelene Hal, L heel switch and R shoe lift to reinforce auditory cue for L weight acceptance - Stairs x8 using bil rails, R shoe lift and L heel switch; Pt required Min A. Ongoing VCs needed to help pt remember her sequencing/technique for stairs.  -Pt reported that her dizziness level was 4/10 after stairs.  Good affect and participation from pt today. Pt continues to lean to R and requires reminders and ongoing cues to accept weight on LLE. She continues to require cues for sequencing of common mobility tasks. Intermittent use of eye patch to cover L eye and encourage tracking/occulomotor strengthening of R eye. Pt preferred not to use eye patch during gait or stairs.   Plan to incorporate vestibular exercises into afternoon treatment. Continue to cue pt to focus gaze on external target for midline orientation during mobility.  Therapy Documentation Precautions:  Precautions Precautions: Fall Precaution Comments: Rt lateral lean, double vision Restrictions Weight Bearing Restrictions: No  Pain: Pain Assessment Pain Assessment: No/denies pain Pain Score: 0-No pain  See FIM for current functional status  Therapy/Group: Individual Therapy  Blenda Mounts 10/15/2014, 9:21 AM

## 2014-10-15 NOTE — Progress Notes (Signed)
Patient has been refusing to take her lipitor due to "leg cramps."  Algis Liming, PA notified and discontinued medication.  Patient refusing to try alternative medications for lipitor at this time and states she will talk to her medical doctor about it.  Will continue to monitor.

## 2014-10-15 NOTE — Progress Notes (Signed)
Social Work Patient ID: Madison Todd, female   DOB: [birth year], 64 y.o.   MRN: [id number] Met with pt to inform her to have her son leave his FMLA papers with her and can get them completed.  Have also left a message for Nate-son to leave forms. Pt is pleased with how well she is doing and is hopeful she will get to thin liquids before she leaves here.

## 2014-10-15 NOTE — Progress Notes (Signed)
Subjective/Complaints: Discussed fluid intake, BP meds and gabapentin  Pt states she received no IVF last noc  Review of Systems - Negative except swallowing and balance problems Objective: Vital Signs: Blood pressure 145/87, pulse 68, temperature 98.7 F (37.1 C), temperature source Oral, resp. rate 16, weight 69 kg (152 lb 1.9 oz), SpO2 94 %. No results found. Results for orders placed or performed during the hospital encounter of 10/01/14 (from the past 72 hour(s))  Glucose, capillary     Status: Abnormal   Collection Time: 10/12/14 11:04 AM  Result Value Ref Range   Glucose-Capillary 141 (H) 70 - 99 mg/dL  Glucose, capillary     Status: Abnormal   Collection Time: 10/12/14  4:31 PM  Result Value Ref Range   Glucose-Capillary 154 (H) 70 - 99 mg/dL  Glucose, capillary     Status: Abnormal   Collection Time: 10/12/14  8:35 PM  Result Value Ref Range   Glucose-Capillary 162 (H) 70 - 99 mg/dL  Glucose, capillary     Status: Abnormal   Collection Time: 10/13/14  6:45 AM  Result Value Ref Range   Glucose-Capillary 154 (H) 70 - 99 mg/dL  Glucose, capillary     Status: Abnormal   Collection Time: 10/13/14 11:52 AM  Result Value Ref Range   Glucose-Capillary 188 (H) 70 - 99 mg/dL   Comment 1 Notify RN   Glucose, capillary     Status: Abnormal   Collection Time: 10/13/14  4:33 PM  Result Value Ref Range   Glucose-Capillary 123 (H) 70 - 99 mg/dL   Comment 1 Documented in Chart   Glucose, capillary     Status: Abnormal   Collection Time: 10/13/14 10:01 PM  Result Value Ref Range   Glucose-Capillary 170 (H) 70 - 99 mg/dL  Glucose, capillary     Status: Abnormal   Collection Time: 10/14/14  7:02 AM  Result Value Ref Range   Glucose-Capillary 137 (H) 70 - 99 mg/dL  Glucose, capillary     Status: Abnormal   Collection Time: 10/14/14 11:34 AM  Result Value Ref Range   Glucose-Capillary 145 (H) 70 - 99 mg/dL  Glucose, capillary     Status: Abnormal   Collection Time: 10/14/14   4:33 PM  Result Value Ref Range   Glucose-Capillary 155 (H) 70 - 99 mg/dL  Glucose, capillary     Status: Abnormal   Collection Time: 10/14/14  9:22 PM  Result Value Ref Range   Glucose-Capillary 196 (H) 70 - 99 mg/dL  Glucose, capillary     Status: Abnormal   Collection Time: 10/15/14  7:03 AM  Result Value Ref Range   Glucose-Capillary 152 (H) 70 - 99 mg/dL     HEENT: normal Cardio: RRR and no murmur Resp: CTA B/L and difficulty clearing secretions GI: BS positive and NT, ND Extremity:  No Edema Skin:   Intact Neuro: Alert/Oriented, Cranial Nerve Abnormalities R V1,2,3 sensory, Abnormal Sensory reduced in LUE and LLE, Abnormal FMC Ataxic/ dec FMC and Other Right lateral pulsion during sitting Musc/Skel:  Normal Gen NAD, voice quality hoarse   Assessment/Plan: 1. 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 Physiatrist is providing close team supervision and 24 hour management of active medical problems listed below. Physiatrist and rehab team continue to assess barriers to discharge/monitor patient progress toward functional and medical goals. FIM: FIM - Bathing Bathing Steps Patient Completed:  Chest, Right Arm, Left Arm, Abdomen, Front perineal area, Buttocks, Right upper leg, Left upper leg, Right lower leg (including foot), Left lower leg (including foot) Bathing: 4: Steadying assist  FIM - Upper Body Dressing/Undressing Upper body dressing/undressing steps patient completed: Thread/unthread right sleeve of pullover shirt/dresss, Thread/unthread left sleeve of pullover shirt/dress, Pull shirt over trunk, Put head through opening of pull over shirt/dress Upper body dressing/undressing: 5: Set-up assist to: Obtain clothing/put away FIM - Lower Body Dressing/Undressing Lower body dressing/undressing steps patient completed:  Thread/unthread right pants leg, Thread/unthread left pants leg, Pull pants up/down, Don/Doff right sock, Don/Doff left sock, Thread/unthread right underwear leg, Thread/unthread left underwear leg, Pull underwear up/down, Don/Doff right shoe, Don/Doff left shoe, Fasten/unfasten right shoe, Fasten/unfasten left shoe Lower body dressing/undressing: 4: Steadying Assist  FIM - Toileting Toileting steps completed by patient: Adjust clothing prior to toileting, Performs perineal hygiene, Adjust clothing after toileting Toileting Assistive Devices: Grab bar or rail for support Toileting: 4: Steadying assist  FIM - Radio producer Devices: Grab bars Toilet Transfers: 4-To toilet/BSC: Min A (steadying Pt. > 75%), 4-From toilet/BSC: Min A (steadying Pt. > 75%)  FIM - Bed/Chair Transfer Bed/Chair Transfer Assistive Devices: Arm rests Bed/Chair Transfer: 5: Supine > Sit: Supervision (verbal cues/safety issues), 5: Sit > Supine: Supervision (verbal cues/safety issues)  FIM - Locomotion: Wheelchair Distance: 150 Locomotion: Wheelchair: 0: Activity did not occur FIM - Locomotion: Ambulation Locomotion: Ambulation Assistive Devices: Orthosis, Nutritional therapist (R shoe lift and L heel switch) Ambulation/Gait Assistance: 3: Mod assist Locomotion: Ambulation: 0: Activity did not occur  Comprehension Comprehension Mode: Auditory Comprehension: 5-Understands complex 90% of the time/Cues < 10% of the time  Expression Expression Mode: Verbal Expression: 5-Expresses complex 90% of the time/cues < 10% of the time  Social Interaction Social Interaction: 6-Interacts appropriately with others with medication or extra time (anti-anxiety, antidepressant).  Problem Solving Problem Solving: 4-Solves basic 75 - 89% of the time/requires cueing 10 - 24% of the time  Memory Memory: 5-Recognizes or recalls 90% of the time/requires cueing < 10% of the time  Medical Problem List and  Plan: 1. Functional deficits secondary to acute lacunar infarct involving the right medullary area, right lateral lean. Dysphagia 2. DVT Prophylaxis/Anticoagulation: SQ Lovenox .Monitor platelet and any signs of bleeding 3. Pain Management: Tylenol 4. Dysphagia. D2 nectar thick liquids. Follow-up speech therapy.  continue low rate IVF HS until liquids advanced 5. Neuropsych: This patient is capable of making decisions on her own behalf. 6. Skin/Wound Care: Routine skin checks 7. Fluids/Electrolytes/Nutrition: Strict I & O.Follow up labs 8.Hypertension. No present antihypertensive medication. Patient on Toprol 50 mg daily prior to admission as well as Tribenzor 40-5-12.5 mg daily., start amlodipine to slowly start bringing down systolic BP  -resumed toprol at 25mg  daily 9.diabetes mellitus with peripheral neuropathy. Patient with hemoglobin A1c 8.9. Sliding scale insulin. Check blood sugars before meals and at bedtime. Patient on Glucophage 500 mg daily as per home regimen--  -diabetic education  -some improvement with glucophage----add pm dose,Improving 10.Hyperlipidemia.lipitor 11.COPD/tobacco abuse.Counseling 12.HX Bilat. breast cancer.Follow up oncology out patient 13.  Hypokalemia- may be IV related now on po K   LOS (Days) 14 A FACE TO FACE EVALUATION WAS PERFORMED  Khalib Fendley E 10/15/2014, 7:07 AM

## 2014-10-15 NOTE — Progress Notes (Signed)
Occupational Therapy Session Note  Patient Details  Name: Madison Todd MRN: [id number] Date of Birth: [birth year]  Today's Date: 10/15/2014 OT Individual Time: 0902-1002 OT Individual Time Calculation (min): 60 min    Short Term Goals: Week 1:  OT Short Term Goal 1 (Week 1): Pt will complete toilet transfer with min assist OT Short Term Goal 1 - Progress (Week 1): Met OT Short Term Goal 2 (Week 1): Pt will complete bathing with min assist for standing balance OT Short Term Goal 2 - Progress (Week 1): Met OT Short Term Goal 3 (Week 1): Pt will complete UB dressing with min assist OT Short Term Goal 3 - Progress (Week 1): Met OT Short Term Goal 4 (Week 1): Pt will complete 2 grooming tasks in standing with min assist for standing balance OT Short Term Goal 4 - Progress (Week 1): Met Week 2:  OT Short Term Goal 1 (Week 2): Pt will complete bathing with Supervision. OT Short Term Goal 2 (Week 2): Pt will complete LB dressing with Supervision. OT Short Term Goal 3 (Week 2): Pt will complete squat pivot transfer to toilet with steadying A. OT Short Term Goal 4 (Week 2): Pt will be able to sit to stand with steadying A without leaning to R to prepare for LB dressing. OT Short Term Goal 5 (Week 2): Pt will complete simulated tub bench transfer in tub (with clothes on) with steadying A w/c to tub bench.  Skilled Therapeutic Interventions/Progress Updates:    Pt seen for BADL retraining at shower level with a focus on trunk and postural control in sitting and standing.  Pt is now completing smoother transfers using a grab bar. She was having more difficulty maintaining trunk control on the tub bench as she kept shifting too far to the right against the wall. She does well with a cue to press down her L hip. In standing, pt used a grab bar to support herself with steadying assist as she managed her clothing over her hips.  Pt then worked on oral care to complete her water protocol with 8 oz. Of  water.  Pt engaged in gaze stabilization exercises with target object fairly close as she was having diplopia with it more than 2 feet away.  Pt tolerated exercises well with no increased dizziness. She also worked on convergence exercises trying to focus on the double As she saw focusing on bringing them into a single A.   Pt resting in w/c at end of session with call light in reach.  Therapy Documentation Precautions:  Precautions Precautions: Fall Precaution Comments: Rt lateral lean, double vision Restrictions Weight Bearing Restrictions: No  Pain: Pain Assessment Pain Assessment: No/denies pain Pain Score: 0-No pain ADL:  See FIM for current functional status  Therapy/Group: Individual Therapy  [place] 10/15/2014, 9:17 AM

## 2014-10-15 NOTE — Progress Notes (Signed)
Speech Language Pathology Weekly Progress and Session Note  Patient Details  Name: Madison Todd MRN: [id number] Date of Birth: 12-07-1950  Beginning of progress report period: October 12, 2014 End of progress report period: October 15, 2014  Today's Date: 10/15/2014 SLP Individual Time: 1400-1430 SLP Individual Time Calculation (min): 30 min  Short Term Goals: Week 2: SLP Short Term Goal 1 (Week 2): Pt will tolerate presentations of her currently prescribed diet wtih no overt s/s of aspiration with Mod I  use of swallowing precautions.  SLP Short Term Goal 1 - Progress (Week 2): Met SLP Short Term Goal 2 (Week 2): Pt will tolerate trials of advanced consistencies with no overt s/s of aspiraiton and supervision cues for use of swallowing precautions.  SLP Short Term Goal 2 - Progress (Week 2): Progressing toward goal SLP Short Term Goal 3 (Week 2): Pt will return demonstration of oral motor strengthening exercises targeting improved right sided strength, range of motion, and coordination for mastication of solid textures with Mod I  SLP Short Term Goal 3 - Progress (Week 2): Met SLP Short Term Goal 4 (Week 2): Pt will return demonstration of pharyngeal strengthening exercises to improve swallowing function and to continue working towards diet advancement with Mod I  SLP Short Term Goal 4 - Progress (Week 2): Met    New Short Term Goals: Week 3: SLP Short Term Goal 1 (Week 3): Pt will tolerate trials of thin liquids during a functional meal with minimal s/s of aspiration and mod I use of swallowing precautions.  SLP Short Term Goal 2 (Week 3): Pt will tolerate trials of advanced textures with no overt s/s of aspiration and supervision cues for use of swallowing precautions.   Weekly Progress Updates:  Pt made functional gains this reporting period and has met 3 out of 4 short term goals.  Pt's diet has been upgraded to dys 3 solids with continued nectar thick liquids, due to improved  mastication of advanced consistencies.  Pt also presents with improved clinical s/s of aspiration during trials of thin liquids, with coughing episodes reduced almost entirely to throat clearing.  Pt is utilizing her recommended swallowing precautions to minimize overt s/s of aspiration with presentations of her currently prescribed diet with intermittent supervision during meals.  Pt would continue to benefit from skilled ST while inpatient to continue working towards diet advancement in order to maximize functional independence and reduce burden of care prior to discharge.     Intensity: Minumum of 1-2 x/day, 30 to 90 minutes Frequency: 5 out of 7 days Duration/Length of Stay: 14-21 days  Treatment/Interventions: Dysphagia/aspiration precaution training;Functional tasks;Patient/family education;Oral motor exercises;Internal/external aids;Environmental controls   Daily Session  Skilled Therapeutic Interventions: Pt was seen for skilled ST targeting dysphagia goals.  Upon arrival, pt was seated upright in wheelchair, awake, alert, and agreeable to participate in ST.  SLP facilitated the session with trials of mixed thin liquid and solid consistencies to continue working towards liquids advancement.  Pt demonstrated a soft, intermittent throat clear following cup sips of regular water and improved mastication of solid cracker consistencies when mixed with purees.  As a result, recommend that pt be upgraded to dys 3 solids with continued nectar thick liquids and trials of regular water per the water protocol.  RN made aware.  Goals updated on this date to reflect current progress and plan of care.        FIM:  Comprehension Comprehension Mode: Auditory Comprehension: 6-Follows complex conversation/direction: With extra  time/assistive device Expression Expression Mode: Verbal Expression: 6-Expresses complex ideas: With extra time/assistive device Social Interaction Social Interaction: 6-Interacts  appropriately with others with medication or extra time (anti-anxiety, antidepressant). Problem Solving Problem Solving: 5-Solves basic 90% of the time/requires cueing < 10% of the time Memory Memory: 5-Recognizes or recalls 90% of the time/requires cueing < 10% of the time FIM - Eating Eating Activity: 5: Supervision/cues  Pain Pain Assessment Pain Assessment: No/denies pain  Therapy/Group: Individual Therapy  Taelar Gronewold, Selinda Orion 10/15/2014, 3:41 PM

## 2014-10-16 ENCOUNTER — Inpatient Hospital Stay (HOSPITAL_COMMUNITY): Payer: Commercial Managed Care - HMO | Admitting: *Deleted

## 2014-10-16 ENCOUNTER — Inpatient Hospital Stay (HOSPITAL_COMMUNITY): Payer: Commercial Managed Care - HMO | Admitting: Occupational Therapy

## 2014-10-16 ENCOUNTER — Inpatient Hospital Stay (HOSPITAL_COMMUNITY): Payer: Commercial Managed Care - HMO | Admitting: Speech Pathology

## 2014-10-16 LAB — URINALYSIS, ROUTINE W REFLEX MICROSCOPIC
Bilirubin Urine: NEGATIVE
GLUCOSE, UA: NEGATIVE mg/dL
HGB URINE DIPSTICK: NEGATIVE
KETONES UR: NEGATIVE mg/dL
Nitrite: NEGATIVE
PROTEIN: NEGATIVE mg/dL
SPECIFIC GRAVITY, URINE: 1.017 (ref 1.005–1.030)
UROBILINOGEN UA: 0.2 mg/dL (ref 0.0–1.0)
pH: 5.5 (ref 5.0–8.0)

## 2014-10-16 LAB — URINE MICROSCOPIC-ADD ON

## 2014-10-16 LAB — GLUCOSE, CAPILLARY
GLUCOSE-CAPILLARY: 140 mg/dL — AB (ref 70–99)
GLUCOSE-CAPILLARY: 202 mg/dL — AB (ref 70–99)
Glucose-Capillary: 128 mg/dL — ABNORMAL HIGH (ref 70–99)
Glucose-Capillary: 99 mg/dL (ref 70–99)

## 2014-10-16 MED ORDER — METFORMIN HCL 850 MG PO TABS
850.0000 mg | ORAL_TABLET | Freq: Two times a day (BID) | ORAL | Status: DC
  Administered 2014-10-16 – 2014-10-23 (×15): 850 mg via ORAL
  Filled 2014-10-16 (×19): qty 1

## 2014-10-16 NOTE — Progress Notes (Signed)
Physical Therapy Weekly Progress Note  Patient Details  Name: Madison Todd MRN: [id number] Date of Birth: 1951-06-12  Beginning of progress report period: October 09, 2014 End of progress report period: October 16, 2014  Today's Date: 10/16/2014 PT Individual Time: 1305-1340 PT Individual Time Calculation (min): 35 min   Patient has met 3 of 5 short term goals. Pt is making progress toward w/c management and dynamic reaching task goals, which will continue to improve with decreased diplopia, decreased vestibular reactivity and increased strength of RLE.  Patient continues to demonstrate the following deficits: diplopia and blurred vision, R lateral pulsion during gait, decreased weight shift to L and strong R lean during functional mobility and therefore will continue to benefit from skilled PT intervention to enhance overall performance with activity tolerance, balance, postural control, ability to compensate for deficits, functional use of  right lower extremity, attention, coordination and knowledge of precautions.  Patient progressing toward long term goals..  Continue plan of care.  PT Short Term Goals Week 3:  PT Short Term Goal 1 (Week 3): STG=LTG  Skilled Therapeutic Interventions/Progress Updates:     Afternoon treatment session focused on transfers R/L with S, gaze stabilization exercises and training in HEP. Pt received seated in w/c with visitors present, wearing eye patch. Pt reports that her current level of dizziness is 1/10, but that she did vomit after morning therapy session.  Therapeutic activity via transfers for task-specific training of household mobility tasks: - Squat pivot transfers R/L x3 with S  - 1st trial, pt had 1 LOB to R during descent  -pt performs transfers well when she is asked to state each step of the task/teach-back method. If her attention is not focused, she requires cues for safety/set-up. - Strength assessment, seated EOB, focusing on  RLE:  - grossly 3+/5 overall, including hip flexion, knee flexion/extension, and ankle DF/PF  Neuromuscular re-education in seated and supine with specific neuromotor exercises for postural control: - x1 viewing/gaze stabilization exercises performed seated in w/c using "A" as visual target against simple background  - 3 x 30 seconds: 1st trial with no eye patch, 2nd trial with eye patch over R eye, 3rd trial over L eye.  - in order to control diplopia, pt needs to reduce velocity and magnitude of R/L or up/down head movements  - Seated EOB B hip flexion/marching with BUE support on bed x 1 min for dynamic seated postural exercise - Seated EOB LAQ B x 10 for dynamic seated postural exercise and B knee strengthening (R>L) - Supine in bed, bridges x 10, modified bridge (LLE extended) x 5  Pt remained supine in bed for a rest, all needs within reach. No c/o of pain or dizziness.  Therapy Documentation Precautions:  Precautions Precautions: Fall Precaution Comments: Rt lateral lean, double vision Restrictions Weight Bearing Restrictions: No Vital Signs: Pulse Rate: 71 BP: 154/86 mmHg Patient Position (if appropriate): Seated SpO2: 99 % Pain: Pain Assessment Pain Assessment: No/denies pain Locomotion : Ambulation Ambulation/Gait Assistance: 1: +2 Total assist Wheelchair Mobility Distance: 150   See FIM for current functional status  Therapy/Group: Individual Therapy  Blenda Mounts 10/16/2014, 4:19 PM

## 2014-10-16 NOTE — Progress Notes (Signed)
Subjective/Complaints: Felt warm last noc, inc of urine but no dysuria, chronic frequency Feels "weak", slept on an off last noc   Review of Systems - Negative except swallowing and balance problems Objective: Vital Signs: Blood pressure 146/71, pulse 73, temperature 98.1 F (36.7 C), temperature source Oral, resp. rate 18, weight 69 kg (152 lb 1.9 oz), SpO2 98 %. No results found. Results for orders placed or performed during the hospital encounter of 10/01/14 (from the past 72 hour(s))  Glucose, capillary     Status: Abnormal   Collection Time: 10/13/14  6:45 AM  Result Value Ref Range   Glucose-Capillary 154 (H) 70 - 99 mg/dL  Glucose, capillary     Status: Abnormal   Collection Time: 10/13/14 11:52 AM  Result Value Ref Range   Glucose-Capillary 188 (H) 70 - 99 mg/dL   Comment 1 Notify RN   Glucose, capillary     Status: Abnormal   Collection Time: 10/13/14  4:33 PM  Result Value Ref Range   Glucose-Capillary 123 (H) 70 - 99 mg/dL   Comment 1 Documented in Chart   Glucose, capillary     Status: Abnormal   Collection Time: 10/13/14 10:01 PM  Result Value Ref Range   Glucose-Capillary 170 (H) 70 - 99 mg/dL  Glucose, capillary     Status: Abnormal   Collection Time: 10/14/14  7:02 AM  Result Value Ref Range   Glucose-Capillary 137 (H) 70 - 99 mg/dL  Glucose, capillary     Status: Abnormal   Collection Time: 10/14/14 11:34 AM  Result Value Ref Range   Glucose-Capillary 145 (H) 70 - 99 mg/dL  Glucose, capillary     Status: Abnormal   Collection Time: 10/14/14  4:33 PM  Result Value Ref Range   Glucose-Capillary 155 (H) 70 - 99 mg/dL  Glucose, capillary     Status: Abnormal   Collection Time: 10/14/14  9:22 PM  Result Value Ref Range   Glucose-Capillary 196 (H) 70 - 99 mg/dL  Glucose, capillary     Status: Abnormal   Collection Time: 10/15/14  7:03 AM  Result Value Ref Range   Glucose-Capillary 152 (H) 70 - 99 mg/dL  Glucose, capillary     Status: Abnormal   Collection Time: 10/15/14 11:29 AM  Result Value Ref Range   Glucose-Capillary 124 (H) 70 - 99 mg/dL  Glucose, capillary     Status: Abnormal   Collection Time: 10/15/14  4:35 PM  Result Value Ref Range   Glucose-Capillary 142 (H) 70 - 99 mg/dL  Glucose, capillary     Status: Abnormal   Collection Time: 10/15/14  8:21 PM  Result Value Ref Range   Glucose-Capillary 201 (H) 70 - 99 mg/dL     HEENT: normal Cardio: RRR and no murmur Resp: CTA B/L and difficulty clearing secretions GI: BS positive and NT, ND Extremity:  No Edema Skin:   Intact Neuro: Alert/Oriented, Cranial Nerve Abnormalities R V1,2,3 sensory, Abnormal Sensory reduced in LUE and LLE, Abnormal FMC Ataxic/ dec FMC , motor 4+/5 in BUE and BLE Musc/Skel:  Normal Gen NAD, voice quality hoarse   Assessment/Plan: 1. 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 Physiatrist is providing close team supervision and 24 hour management of active medical problems listed below. Physiatrist and rehab team continue to assess barriers to discharge/monitor patient progress toward functional and medical goals. FIM: FIM - Bathing  Bathing Steps Patient Completed: Chest, Right Arm, Left Arm, Abdomen, Front perineal area, Buttocks, Right upper leg, Left upper leg, Right lower leg (including foot), Left lower leg (including foot) Bathing: 4: Steadying assist  FIM - Upper Body Dressing/Undressing Upper body dressing/undressing steps patient completed: Thread/unthread right sleeve of pullover shirt/dresss, Thread/unthread left sleeve of pullover shirt/dress, Pull shirt over trunk, Put head through opening of pull over shirt/dress Upper body dressing/undressing: 5: Set-up assist to: Obtain clothing/put away FIM - Lower Body Dressing/Undressing Lower body dressing/undressing steps patient completed:  Thread/unthread right pants leg, Thread/unthread left pants leg, Pull pants up/down, Don/Doff right sock, Don/Doff left sock, Thread/unthread right underwear leg, Thread/unthread left underwear leg, Pull underwear up/down, Don/Doff right shoe, Don/Doff left shoe, Fasten/unfasten right shoe, Fasten/unfasten left shoe Lower body dressing/undressing: 4: Steadying Assist  FIM - Toileting Toileting steps completed by patient: Adjust clothing prior to toileting, Performs perineal hygiene, Adjust clothing after toileting Toileting Assistive Devices: Grab bar or rail for support Toileting: 4: Steadying assist  FIM - Radio producer Devices: Product manager Transfers: 5-From toilet/BSC: Supervision (verbal cues/safety issues), 5-To toilet/BSC: Supervision (verbal cues/safety issues)  FIM - Control and instrumentation engineer Devices: Arm rests Bed/Chair Transfer: 4: Chair or W/C > Bed: Min A (steadying Pt. > 75%)  FIM - Locomotion: Wheelchair Distance: 150 Locomotion: Wheelchair: 6: Travels 150 ft or more, turns around, maneuvers to table, bed or toilet, negotiates 3% grade: maneuvers on rugs and over door sills independently FIM - Locomotion: Ambulation Locomotion: Ambulation Assistive Devices: Nutritional therapist, Other (comment) (R shoe lift, L heel switch) Ambulation/Gait Assistance: 3: Mod assist Locomotion: Ambulation: 2: Travels 50 - 149 ft with moderate assistance (Pt: 50 - 74%)  Comprehension Comprehension Mode: Auditory Comprehension: 6-Follows complex conversation/direction: With extra time/assistive device  Expression Expression Mode: Verbal Expression: 6-Expresses complex ideas: With extra time/assistive device  Social Interaction Social Interaction: 6-Interacts appropriately with others with medication or extra time (anti-anxiety, antidepressant).  Problem Solving Problem Solving: 5-Solves basic 90% of the time/requires cueing < 10% of the  time  Memory Memory: 5-Recognizes or recalls 90% of the time/requires cueing < 10% of the time  Medical Problem List and Plan: 1. 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 DVT Prophylaxis/Anticoagulation: SQ Lovenox .Monitor platelet and any signs of bleeding 3. Pain Management: Tylenol 4. Dysphagia. D2 nectar thick liquids. Follow-up speech therapy.  continue low rate IVF HS until liquids advanced 5. Neuropsych: This patient is capable of making decisions on her own behalf. 6. Skin/Wound Care: Routine skin checks 7. Fluids/Electrolytes/Nutrition: Strict I & O.Follow up labs 8.Hypertension. No present antihypertensive medication. Patient on Toprol 50 mg daily prior to admission as well as Tribenzor 40-5-12.5 mg daily., start amlodipine to slowly start bringing down systolic BP  -resumed toprol at 25mg  daily 9.diabetes mellitus with peripheral neuropathy. Patient with hemoglobin A1c 8.9. Sliding scale insulin. Check blood sugars before meals and at bedtime. Patient on Glucophage 850 mg BID (home took 500mg ) -diabetic education  -some improvement with glucophage----add pm dose,Improving 10.Hyperlipidemia.lipitor 11.COPD/tobacco abuse.Counseling 12.HX Bilat. breast cancer.Follow up oncology out patient 13.  Hypokalemia- may be IV related now on po K 14.  "weakness"  Urinary incont x 1 will check UA, neuro exam stable LOS (Days) 15 A FACE TO FACE EVALUATION WAS PERFORMED  KIRSTEINS,ANDREW E 10/16/2014, 6:31 AM

## 2014-10-16 NOTE — Plan of Care (Signed)
Problem: RH Ambulation Goal: LTG Patient will ambulate in community environment (PT) LTG: Patient will ambulate in community environment, # of feet with assistance (PT).  Outcome: Not Applicable Date Met:  33/61/22 Goal DC'd to focus on household ambulation.

## 2014-10-16 NOTE — Progress Notes (Signed)
Physical Therapy Session Note  Patient Details  Name: Madison Todd MRN: [id number] Date of Birth: 02/15/1951  Today's Date: 10/16/2014 PT Individual Time:  0930- 0037      Short Term Goals: Week 2:  PT Short Term Goal 1 (Week 2): Pt will perform transfers safely with S to R/L. PT Short Term Goal 2 (Week 2): Pt will ambulate 16' with Mod A of 1 with LRAD. PT Short Term Goal 3 (Week 2): Pt will reach 10' outside BOS and self-correct for midline orientation during functional household tasks to R/L. PT Short Term Goal 4 (Week 2): Pt will perform 5 stairs with Min A using bil rails PT Short Term Goal 5 (Week 2): Pt will propel w/c 150', managing all parts, with Mod I.   Skilled Therapeutic Interventions/Progress Updates:     Emphasis today on gait training with Lite Gait in order to decrease BUE reliance on AD and increase gait repetitions for neuromotor reeducation. Treatment also assessed transfers and reducing cues/feedback to assess carry-over, as well as gait with no AD. Pt reports that dizziness level seated in w/c is 3/10. Pt reports that eye patch helps visual stability "somewhat."  Vitals before activity: 123/77 95, 68  Gait training with Lite Gait for dynamic balance and midline orientation: -1st trail: 0.6 mph (1 min) >> 0.8 mph (2 min) x 175' -2nd trial of Lite Gait 0.6 mph >> 1.0 mph x 270' over 3 min - treatment included faded use of BUEs, use of eye patch over R eye, mirror for gross visual feedback and "A" as near visual target for midline orientation; R shoe lift also used in alternation with no shoe AD, inconclusive as to whether or not it appreciably improves overall gait. - As pt decreases BUE support, she begins to need manual facilitation at hips for lateral and forward weight shift  Vitals after 1st trial 154/86 100 71 Vitals after rest break 140/73 95  Vitals after 2nd trial 137/76 99 72  Final gait trail with no AD, B elbow support x 50'; gait was +2 assist  to manually facilitate adequate weight shifting. Pt was returned to room seated in w/c with tray table and all needs within reach.  Pt's mobility is still stignificantly limited by deficits in vision (diplopia, blurred), and central vestibular effects of the stroke. During multiple rest breaks, pt reports that she is "resting her eyes." Pt wore eye patch on R eye x 45 min. Short-term memory continues to be a challenge.  Plan to focus on vestibular rehab exercises and to progress gait by decreasing BUE reliance. Treatment emphasis on pt's performance of all transfers R/L with S, no VCs from PT.  Therapy Documentation Precautions:  Precautions Precautions: Fall Precaution Comments: Rt lateral lean, double vision Restrictions Weight Bearing Restrictions: No Pain: Pain Assessment Pain Assessment: No/denies pain  See FIM for current functional status  Therapy/Group: Individual Therapy  Blenda Mounts 10/16/2014, 9:43 AM

## 2014-10-16 NOTE — Progress Notes (Signed)
Speech Language Pathology Daily Session Note  Patient Details  Name: Madison Todd MRN: [id number] Date of Birth: 06-12-1951  Today's Date: 10/16/2014 SLP Individual Time: 1130-1200 SLP Individual Time Calculation (min): 30 min  Short Term Goals: Week 3: SLP Short Term Goal 1 (Week 3): Pt will tolerate trials of thin liquids during a functional meal with minimal s/s of aspiration and mod I use of swallowing precautions.  SLP Short Term Goal 2 (Week 3): Pt will tolerate trials of advanced textures with no overt s/s of aspiration and supervision cues for use of swallowing precautions.   Skilled Therapeutic Interventions:  Pt was seen for skilled ST targeting dysphagia goals.  Upon arrival, pt was seated upright in wheelchair, awake, alert, and agreeable to participate in ST.  SLP facilitated the session with trials of thin liquids during a functional meal to continue working towards liquids advancement.  Pt presented with soft intermittent throat clear following mixed consistencies.  Pt also demonstrated adequate mastication of dys 3 solids resulting in effective clearance of residual solids from the oral cavity.  Pt was supervision/Mod I for use of swallowing precautions during her meal. As a result, recommend that pt's diet be advanced to thin liquids with continued dys 3 textures.  Continue per current plan of care.     FIM:  Comprehension Comprehension Mode: Auditory Comprehension: 6-Follows complex conversation/direction: With extra time/assistive device Expression Expression Mode: Verbal Expression: 6-Expresses complex ideas: With extra time/assistive device Social Interaction Social Interaction: 6-Interacts appropriately with others with medication or extra time (anti-anxiety, antidepressant). Problem Solving Problem Solving: 5-Solves basic problems: With no assist Memory Memory: 5-Recognizes or recalls 90% of the time/requires cueing < 10% of the time FIM - Eating Eating  Activity: 5: Supervision/cues  Pain Pain Assessment Pain Assessment: No/denies pain  Therapy/Group: Individual Therapy  Keniesha Adderly, Selinda Orion 10/16/2014, 3:42 PM

## 2014-10-16 NOTE — Progress Notes (Signed)
Occupational Therapy Weekly Progress Note  Patient Details  Name: Madison Todd MRN: [id number] Date of Birth: Aug 20, 1951  Beginning of progress report period: October 09, 2014 End of progress report period: October 16, 2014  Today's Date: 10/16/2014 OT Individual Time: 2035-5974 OT Individual Time Calculation (min): 60 min    Patient has met 3 of 5 short term goals.  Pt is making gradual progress. She has improved with her squat pivot  Transfers to a supervision level, but continues to need steadying A with self care due to decreased balance. Pt can not access her home bathroom with a w/c and she is not ready to use a RW to access bathroom. She needs continued tx to focus on balance and mobility.  Patient continues to demonstrate the following deficits: vestibular dysfunction, diplopia, decreased trunk control and midline awareness and therefore will continue to benefit from skilled OT intervention to enhance overall performance with BADL.  Patient progressing toward long term goals..  Continue plan of care.  OT Short Term Goals Week 1:  OT Short Term Goal 1 (Week 1): Pt will complete toilet transfer with min assist OT Short Term Goal 1 - Progress (Week 1): Met OT Short Term Goal 2 (Week 1): Pt will complete bathing with min assist for standing balance OT Short Term Goal 2 - Progress (Week 1): Met OT Short Term Goal 3 (Week 1): Pt will complete UB dressing with min assist OT Short Term Goal 3 - Progress (Week 1): Met OT Short Term Goal 4 (Week 1): Pt will complete 2 grooming tasks in standing with min assist for standing balance OT Short Term Goal 4 - Progress (Week 1): Met Week 2:  OT Short Term Goal 1 (Week 2): Pt will complete bathing with Supervision. OT Short Term Goal 1 - Progress (Week 2): Progressing toward goal OT Short Term Goal 2 (Week 2): Pt will complete LB dressing with Supervision. OT Short Term Goal 2 - Progress (Week 2): Progressing toward goal OT Short Term  Goal 3 (Week 2): Pt will complete squat pivot transfer to toilet with steadying A. OT Short Term Goal 3 - Progress (Week 2): Met OT Short Term Goal 4 (Week 2): Pt will be able to sit to stand with steadying A without leaning to R to prepare for LB dressing. OT Short Term Goal 4 - Progress (Week 2): Met OT Short Term Goal 5 (Week 2): Pt will complete simulated tub bench transfer in tub (with clothes on) with steadying A w/c to tub bench. OT Short Term Goal 5 - Progress (Week 2): Met Week 3:  OT Short Term Goal 1 (Week 3): Pt will bathe with supervision demonstrating improved sitting balance on tub bench. OT Short Term Goal 2 (Week 3): Pt will dress LB with supervision demonstrating improved trunk control to prevent leaning to the R. OT Short Term Goal 3 (Week 3): Pt will complete a stand pivot transfer with a RW with min A.  Skilled Therapeutic Interventions/Progress Updates:    Pt seen for BADL retraining with a focus on balance and trunk control. Pt completed oral care to drink water and then opted to bathe at sink. She continues to need steadying A in standing as she leans to the R and is not fully aware. She needed to toilet and completed w/c >< toilet squat pivot transfer with supervision. Pt is I with hygiene but needs steadying A with clothing management. She completed dressing and then worked on tub bench transfers in  ADL apt with S. Discussed width of bathroom door and that it is not w/c accessible. If she is not safe to use a RW by discharge she will need to sponge bathe and use a BSC. Hopefully she will continue to progress well. Our tx focus this next week will be on balance and simple transfers with RW. Pt returned to her room to repeat oral care routine to drink more water.  Therapy Documentation Precautions:  Precautions Precautions: Fall Precaution Comments: Rt lateral lean, double vision Restrictions Weight Bearing Restrictions: No     Pain: Pain Assessment Pain Assessment:  No/denies pain ADL:  See FIM for current functional status  Therapy/Group: Individual Therapy  [place] 10/16/2014, 9:55 AM

## 2014-10-17 ENCOUNTER — Inpatient Hospital Stay (HOSPITAL_COMMUNITY): Payer: Commercial Managed Care - HMO | Admitting: Occupational Therapy

## 2014-10-17 ENCOUNTER — Inpatient Hospital Stay (HOSPITAL_COMMUNITY): Payer: Commercial Managed Care - HMO

## 2014-10-17 ENCOUNTER — Encounter (HOSPITAL_COMMUNITY): Payer: Commercial Managed Care - HMO | Admitting: Speech Pathology

## 2014-10-17 LAB — URINE CULTURE
CULTURE: NO GROWTH
Colony Count: NO GROWTH

## 2014-10-17 LAB — GLUCOSE, CAPILLARY
Glucose-Capillary: 139 mg/dL — ABNORMAL HIGH (ref 70–99)
Glucose-Capillary: 148 mg/dL — ABNORMAL HIGH (ref 70–99)
Glucose-Capillary: 182 mg/dL — ABNORMAL HIGH (ref 70–99)
Glucose-Capillary: 120 mg/dL — ABNORMAL HIGH (ref 70–99)

## 2014-10-17 NOTE — Progress Notes (Signed)
Subjective/Complaints: Feels good this am, would like to go home if poss UA -   Review of Systems - Negative except swallowing and balance problems Objective: Vital Signs: Blood pressure 122/77, pulse 81, temperature 98.4 F (36.9 C), temperature source Oral, resp. rate 18, weight 69 kg (152 lb 1.9 oz), SpO2 95 %. No results found. Results for orders placed or performed during the hospital encounter of 10/01/14 (from the past 72 hour(s))  Glucose, capillary     Status: Abnormal   Collection Time: 10/14/14 11:34 AM  Result Value Ref Range   Glucose-Capillary 145 (H) 70 - 99 mg/dL  Glucose, capillary     Status: Abnormal   Collection Time: 10/14/14  4:33 PM  Result Value Ref Range   Glucose-Capillary 155 (H) 70 - 99 mg/dL  Glucose, capillary     Status: Abnormal   Collection Time: 10/14/14  9:22 PM  Result Value Ref Range   Glucose-Capillary 196 (H) 70 - 99 mg/dL  Glucose, capillary     Status: Abnormal   Collection Time: 10/15/14  7:03 AM  Result Value Ref Range   Glucose-Capillary 152 (H) 70 - 99 mg/dL  Glucose, capillary     Status: Abnormal   Collection Time: 10/15/14 11:29 AM  Result Value Ref Range   Glucose-Capillary 124 (H) 70 - 99 mg/dL  Glucose, capillary     Status: Abnormal   Collection Time: 10/15/14  4:35 PM  Result Value Ref Range   Glucose-Capillary 142 (H) 70 - 99 mg/dL  Glucose, capillary     Status: Abnormal   Collection Time: 10/15/14  8:21 PM  Result Value Ref Range   Glucose-Capillary 201 (H) 70 - 99 mg/dL  Glucose, capillary     Status: Abnormal   Collection Time: 10/16/14  7:13 AM  Result Value Ref Range   Glucose-Capillary 140 (H) 70 - 99 mg/dL  Urinalysis, Routine w reflex microscopic     Status: Abnormal   Collection Time: 10/16/14  8:45 AM  Result Value Ref Range   Color, Urine YELLOW YELLOW   APPearance CLOUDY (A) CLEAR   Specific Gravity, Urine 1.017 1.005 - 1.030   pH 5.5 5.0 - 8.0   Glucose, UA NEGATIVE NEGATIVE mg/dL   Hgb urine  dipstick NEGATIVE NEGATIVE   Bilirubin Urine NEGATIVE NEGATIVE   Ketones, ur NEGATIVE NEGATIVE mg/dL   Protein, ur NEGATIVE NEGATIVE mg/dL   Urobilinogen, UA 0.2 0.0 - 1.0 mg/dL   Nitrite NEGATIVE NEGATIVE   Leukocytes, UA TRACE (A) NEGATIVE  Urine microscopic-add on     Status: Abnormal   Collection Time: 10/16/14  8:45 AM  Result Value Ref Range   Squamous Epithelial / LPF FEW (A) RARE   WBC, UA 3-6 <3 WBC/hpf   RBC / HPF 0-2 <3 RBC/hpf   Bacteria, UA FEW (A) RARE  Glucose, capillary     Status: Abnormal   Collection Time: 10/16/14 11:37 AM  Result Value Ref Range   Glucose-Capillary 128 (H) 70 - 99 mg/dL  Glucose, capillary     Status: Abnormal   Collection Time: 10/16/14  4:21 PM  Result Value Ref Range   Glucose-Capillary 202 (H) 70 - 99 mg/dL  Glucose, capillary     Status: None   Collection Time: 10/16/14  9:07 PM  Result Value Ref Range   Glucose-Capillary 99 70 - 99 mg/dL   Comment 1 Notify RN   Glucose, capillary     Status: Abnormal   Collection Time: 10/17/14  6:28 AM  Result Value Ref Range   Glucose-Capillary 139 (H) 70 - 99 mg/dL   Comment 1 Notify RN      HEENT: normal Cardio: RRR and no murmur Resp: CTA B/L and difficulty clearing secretions GI: BS positive and NT, ND Extremity:  No Edema Skin:   Intact Neuro: Alert/Oriented, Cranial Nerve Abnormalities R V1,2,3 sensory, Abnormal Sensory reduced in LUE and LLE, Abnormal FMC Ataxic/ dec FMC , motor 4+/5 in BUE and BLE Musc/Skel:  Normal Gen NAD, voice quality hoarse   Assessment/Plan: 1. 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 Physiatrist is providing close team supervision and 24 hour management of active medical problems listed below. Physiatrist and rehab team continue to assess barriers to discharge/monitor patient progress toward functional and  medical goals. Team conference today please see physician documentation under team conference tab, met with team face-to-face to discuss problems,progress, and goals. Formulized individual treatment plan based on medical history, underlying problem and comorbidities. FIM: FIM - Bathing Bathing Steps Patient Completed: Chest, Right Arm, Left Arm, Abdomen, Front perineal area, Buttocks, Right upper leg, Left upper leg, Right lower leg (including foot), Left lower leg (including foot) Bathing: 4: Steadying assist  FIM - Upper Body Dressing/Undressing Upper body dressing/undressing steps patient completed: Thread/unthread right sleeve of pullover shirt/dresss, Thread/unthread left sleeve of pullover shirt/dress, Pull shirt over trunk, Put head through opening of pull over shirt/dress Upper body dressing/undressing: 5: Set-up assist to: Obtain clothing/put away FIM - Lower Body Dressing/Undressing Lower body dressing/undressing steps patient completed: Thread/unthread right pants leg, Thread/unthread left pants leg, Pull pants up/down, Don/Doff right sock, Don/Doff left sock, Thread/unthread right underwear leg, Thread/unthread left underwear leg, Pull underwear up/down, Don/Doff right shoe, Don/Doff left shoe, Fasten/unfasten right shoe, Fasten/unfasten left shoe Lower body dressing/undressing: 4: Steadying Assist  FIM - Toileting Toileting steps completed by patient: Adjust clothing prior to toileting, Performs perineal hygiene, Adjust clothing after toileting Toileting Assistive Devices: Grab bar or rail for support Toileting: 4: Steadying assist  FIM - Radio producer Devices: Grab bars Toilet Transfers: 5-From toilet/BSC: Supervision (verbal cues/safety issues), 5-To toilet/BSC: Supervision (verbal cues/safety issues)  FIM - Control and instrumentation engineer Devices: Arm rests Bed/Chair Transfer: 5: Set-up assist to: Apply orthosis/W/C set-up  FIM  - Locomotion: Wheelchair Distance: 150 Locomotion: Wheelchair: 5: Travels 150 ft or more: maneuvers on rugs and over door sills with supervision, cueing or coaxing FIM - Locomotion: Ambulation Locomotion: Ambulation Assistive Devices: Lite Gait, Other (comment) (No AD/2 elbow hold) Ambulation/Gait Assistance: 1: +2 Total assist Locomotion: Ambulation: 1: Two helpers  Comprehension Comprehension Mode: Auditory Comprehension: 6-Follows complex conversation/direction: With extra time/assistive device  Expression Expression Mode: Verbal Expression: 6-Expresses complex ideas: With extra time/assistive device  Social Interaction Social Interaction: 6-Interacts appropriately with others with medication or extra time (anti-anxiety, antidepressant).  Problem Solving Problem Solving: 5-Solves basic problems: With no assist  Memory Memory: 6-More than reasonable amt of time  Medical Problem List and Plan: 1. 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 DVT Prophylaxis/Anticoagulation: SQ Lovenox .Monitor platelet and any signs of bleeding 3. Pain Management: Tylenol 4. Dysphagia. D2 nectar thick liquids. Follow-up speech therapy.  continue low rate IVF HS until liquids advanced 5. Neuropsych: This patient is capable of making decisions on her own behalf. 6. Skin/Wound Care: Routine skin checks 7. Fluids/Electrolytes/Nutrition: Strict I & O.Follow up labs  8.Hypertension. No present antihypertensive medication. Patient on Toprol 50 mg daily prior to admission as well as Tribenzor 40-5-12.5 mg daily., start amlodipine to slowly start bringing down systolic BP  -resumed toprol at 49m daily 9.diabetes mellitus with peripheral neuropathy. Patient with hemoglobin A1c 8.9. Sliding scale insulin. Check blood sugars before meals and at bedtime. Patient on Glucophage 850 mg BID (home took 5042m -diabetic education  -some improvement with  glucophage----add pm dose,Improving 10.Hyperlipidemia.lipitor 11.COPD/tobacco abuse.Counseling  12.HX Bilat. breast cancer.Follow up oncology out patient 13.  Hypokalemia- may be IV related now on po K 14.  "weakness"  Urinary incont x 1 will check UA, neuro exam stable LOS (Days) 16 A FACE TO FACE EVALUATION WAS PERFORMED  KIRSTEINS,ANDREW E 10/17/2014, 7:31 AM

## 2014-10-17 NOTE — Progress Notes (Signed)
Physical Therapy Session Note  Patient Details  Name: Madison Todd MRN: [id number] Date of Birth: December 30, 1950  Today's Date: 10/17/2014 PT Individual Time: 9191-6606 PT Individual Time Calculation (min): 30 min   Short Term Goals: Week 3:  PT Short Term Goal 1 (Week 3): STG=LTG  Skilled Therapeutic Interventions/Progress Updates:     Pt received in room, seated in w/c. Pt reports dizziness 3/10, seated in w/c. No c/o pain.  Treatment today focused on vestibular rehab exercises, progressing gait, and equipment planning.  Therapeutic exercise via gaze stabilization exercises to improve occulomotor coordination: - x1 vieweing using eye patch: R eye covered, L eye covered, B eyes open x 30 sec each position  - progressed to holding "A" at 3 ft from pt   - during trial with both eyes open; distance with visual target ("A") was reduced due to diplopia  - pt reports dizziness 4/10 after exercises  Gait training via task-specific training for household mobility: - gait x 40' with Mod A  - 1st trail with RW with (2) 5# cuff weights; 2nd trial without AD, using B elbow support  - pt had 1 LOB to R during quiet standing rest break  - gait using visual target ("A") 3' from pt - transfer stand > sit EOB with S; cue to reach back with L hand  Self care for home entry analysis and discharge planning: - pt spoke with son, who reports that bathroom doors are 50' - current w/c is 18 x 16, 26' wide and 19' high  PT provided assist to toilet; pt propelled w/c to bathroom, transferred with S and performed hygiene. Pt propelled back to room with reminder to call nurse for transfer to bed. All needs within reach.  Functional progress with gait continues to be limited by strong R lateral pulsion during gait, reduced weight shift to L, and diplopia. Use of visual target for gaze stabilization during gait does not appear to improve midline orientation.   Plan to continue to progress gait distance  with RW for improved endurance and activity tolerance. Continue to work on Midwife exercises.  Therapy Documentation Precautions:  Precautions Precautions: Fall Precaution Comments: Rt lateral lean, double vision Restrictions Weight Bearing Restrictions: No Locomotion : Ambulation Ambulation/Gait Assistance: 3: Mod assist   See FIM for current functional status  Therapy/Group: Individual Therapy  Blenda Mounts 10/17/2014, 4:04 PM

## 2014-10-17 NOTE — Progress Notes (Signed)
Social Work Patient ID: Madison Todd, female   DOB: 03-Oct-1950, 64 y.o.   MRN: [id number] Met with pt and and spoke with son via telephone to discuss team conference progress toward goals and need for family education. Have scheduled him for Friday from 9;00-12;00.  Both aware discharge is still 2/9 need for her to get as high level as possible before going home.

## 2014-10-17 NOTE — Progress Notes (Signed)
Occupational Therapy Session Note  Patient Details  Name: Madison Todd MRN: [id number] Date of Birth: [birth year]  Today's Date: 10/17/2014 OT Individual Time: 0800-0900 OT Individual Time Calculation (min): 60 min    Short Term Goals: Week 1:  OT Short Term Goal 1 (Week 1): Pt will complete toilet transfer with min assist OT Short Term Goal 1 - Progress (Week 1): Met OT Short Term Goal 2 (Week 1): Pt will complete bathing with min assist for standing balance OT Short Term Goal 2 - Progress (Week 1): Met OT Short Term Goal 3 (Week 1): Pt will complete UB dressing with min assist OT Short Term Goal 3 - Progress (Week 1): Met OT Short Term Goal 4 (Week 1): Pt will complete 2 grooming tasks in standing with min assist for standing balance OT Short Term Goal 4 - Progress (Week 1): Met Week 2:  OT Short Term Goal 1 (Week 2): Pt will complete bathing with Supervision. OT Short Term Goal 1 - Progress (Week 2): Progressing toward goal OT Short Term Goal 2 (Week 2): Pt will complete LB dressing with Supervision. OT Short Term Goal 2 - Progress (Week 2): Progressing toward goal OT Short Term Goal 3 (Week 2): Pt will complete squat pivot transfer to toilet with steadying A. OT Short Term Goal 3 - Progress (Week 2): Met OT Short Term Goal 4 (Week 2): Pt will be able to sit to stand with steadying A without leaning to R to prepare for LB dressing. OT Short Term Goal 4 - Progress (Week 2): Met OT Short Term Goal 5 (Week 2): Pt will complete simulated tub bench transfer in tub (with clothes on) with steadying A w/c to tub bench. OT Short Term Goal 5 - Progress (Week 2): Met Week 3:  OT Short Term Goal 1 (Week 3): Pt will bathe with supervision demonstrating improved sitting balance on tub bench. OT Short Term Goal 2 (Week 3): Pt will dress LB with supervision demonstrating improved trunk control to prevent leaning to the R. OT Short Term Goal 3 (Week 3): Pt will complete a stand pivot transfer  with a RW with min A.  Skilled Therapeutic Interventions/Progress Updates:    Pt seen for ADL retraining with a focus on sitting and standing balance. Pt completed grooming and then transferred to tub bench with only close S. Instead of standing in shower which requires min A, pt used lateral leans to wash bottom. This allowed her to only need S. She continues to have difficulty with full trunk elongation with weight shifts especially to the right. She dressed in front of sink using the counter to support herself. Today she was able to stand up and maintain her balance with close S as she adjusted clothing over her hips. No leaning to the right. She continues to need very close guarding on the right as she can fall to the right very rapidly.  Pt then propelled her w/c to the gym and transferred to a mat that was raised so her feet were unsupported. She worked on dynamic reaching to both left and right with weight shifting to increase balance for her safety on the tub bench.She practiced reaching for small objects under each hip to force a bigger weight shift. She also worked on sit><stand from elevated surface with hands on her thighs. Pt able to stand up and down several times with very close S, maintaining symmetrical control and balance. Her R leg tends to hyperextend. Pt's PTs arrived  for her next session.  Therapy Documentation Precautions:  Precautions Precautions: Fall Precaution Comments: Rt lateral lean, double vision Restrictions Weight Bearing Restrictions: No   Pain: Pain Assessment Pain Assessment: No/denies pain ADL:  See FIM for current functional status  Therapy/Group: Individual Therapy  [place] 10/17/2014, 11:25 AM

## 2014-10-17 NOTE — Patient Care Conference (Signed)
Inpatient RehabilitationTeam Conference and Plan of Care Update Date: 10/17/2014   Time: 10;35 AM    Patient Name: Madison Todd      Medical Record Number: [id number]  Date of Birth: 1951-07-17 Sex: Female         Room/Bed: 4M04C/4M04C-01 Payor Info: Payor: [employer] MEDICARE / Plan: [place] [employer]/NTSP / Product Type: *No Product type* /    Admitting Diagnosis: Medulla and cerebellar CVA  Admit Date/Time:  10/01/2014  3:47 PM Admission Comments: No comment available   Primary Diagnosis:  Wallenberg syndrome Principal Problem: Wallenberg syndrome  Patient Active Problem List   Diagnosis Date Noted  . Dysphagia S/P CVA (cerebrovascular accident) 10/10/2014  . Type 2 diabetes mellitus not at goal 10/05/2014  . 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 Lacunar infarction 10/01/2014  . 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 CVA (cerebral infarction) 09/28/2014  . TIA (transient ischemic attack) 09/27/2014  . HTN (hypertension) 09/27/2014  . Breast cancer, right breast 12/16/2013  . 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 Adenomatous polyp 10/02/2013  . 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 Cardiomyopathy 07/22/2011  . COPD (chronic obstructive pulmonary disease) 07/22/2011    Expected Discharge Date: Expected Discharge Date: 10/23/14  Team Members Present: Physician leading conference: Dr. Alysia Penna Social Worker Present: Ovidio Kin, LCSW Nurse Present: Heather Roberts, RN PT Present: Raylene Everts, PT;Caroline Lacinda Axon, PT;Blair Hobble, PT OT Present: Simonne Come, OT SLP Present: Windell Moulding, SLP PPS Coordinator present : Daiva Nakayama, RN, CRRN     Current Status/Progress Goal Weekly Team Focus  Medical   Urinary freq, no UTI  Home d/c with fiancee, sup/min level  d/c planning   Bowel/Bladder   Cont of bowel and bladder  Mod I  assess for accidents   Swallow/Nutrition/ Hydration   Dys 3, thin  liquids, intermittent supervision  Mod I for least restrictive diet   trials of advanced consistencies, toleration of currently prescribed diet    ADL's   steady A with standing during self care, supervision squat pivot transfers, mod - max A stand pivot  supervision overall  balance, postural control, midline orientation, pt education   Mobility   S/min A transfers, Mod A gait x50', Min A flight stairs, S/Mod I WC prop  S transfers, Min A gait x50', Min A stairs, Mod I WC prop  Vestibulat/visual gaze stabilization, NMR, family training, DC planning, WC and transfer training.    Communication     na        Safety/Cognition/ Behavioral Observations    no unsafe behaviors        Pain   n/a         Skin   n/a            *See Care Plan and progress notes for long and short-term goals.  Barriers to Discharge: will needs 24/7 assist    Possible Resolutions to Barriers:  caregiver training    Discharge Planning/Teaching Needs:  HOme with hired assist for both she and Aunt she provided care for PTA.  Son also has FMLA papers completed      Team Discussion:  Upgraded diet to Dys 2 thin liquids. Downgraded goals to min level.  UA was negative. Making progress slower than expected. Family education on Friday with son.  Aware will need 24 hr care at discharge  Revisions to Treatment Plan:  Downgraded PT & OT goals-overall min assist level  Continued Need for Acute Rehabilitation Level of Care: The patient requires daily medical management by a physician with specialized training in physical medicine and rehabilitation for the following conditions: Daily direction of a multidisciplinary physical rehabilitation program to ensure safe treatment while eliciting the highest outcome that is of practical value to the patient.: Yes Daily medical management of patient stability for increased activity during participation in an intensive rehabilitation regime.: Yes Daily analysis of laboratory  values and/or radiology reports with any subsequent need for medication adjustment of medical intervention for : Neurological problems;Other  Janat Tabbert, [place] 10/18/2014, 10:13 AM

## 2014-10-17 NOTE — Progress Notes (Signed)
Physical Therapy Session Note  Patient Details  Name: Madison Todd MRN: [id number] Date of Birth: [birth year]  Today's Date: 10/17/2014 PT Individual Time: 0900-1005  PT Individual Time Calculation (min): 65 min   Short Term Goals: Week 3:  PT Short Term Goal 1 (Week 3): STG=LTG  Skilled Therapeutic Interventions/Progress Updates:  Pt received seated edge of mat in therapy gym after her morning session with OT. Pt states that dizziness level in seated is 3/10. Pt was wearing her eye patch, but states that she's "not sure" if it helps, and if blocking R or L eye helps more/less.  Treatment focused on Berg balance assessment, home entry/discharge plans, equipment planning, and gait training with RW.  Berg score: 24, see below.  Therapeutic activity via task-breakdown for improved functional mobility. - Berg balance assessment, score = 24 (high Fall risk) - transfers R/L using squat pivot method, close S; one cue for safety - sit to stand/stand to sit transfers, close S - Min A for occasional steady A  Gait training with RW for household ambulation goals. - Stairs ascend/descend 5 with 2 rails, Min A - Gait x 40' with RW (+ 2 10# weights)  - VCs for sequencing, technique, foot placement   - step-to pattern, min cues to lead with R foot  Self Care training for safe transition to home environment: - analysis of Home Entry  - 3 stairs with 2 rails + 5' walkway + threshold to enter front door.; front door width TBD  - bathroom, interiors - see OT - Equipment order planning  - communicated to CSW: Goal = RW + W/C rental; eye patch TBD; RW type TBD (wide?)  - ankle weights for RW - to discuss with family - scheduling of family education  Good participation from [place] today. Improvements noted in safe transfers R/L and sit/stand, but pt needs to be cued to "stand safely." Continue to diminish cues and transition to pt demonstration of all safety steps without reminders. Emphasize pt  planning of each step for each functional movement.  Plan to schedule family education with son, Zenaida Deed, on Friday.     Therapy Documentation Precautions:  Precautions Precautions: Fall Precaution Comments: Rt lateral lean, double vision Restrictions Weight Bearing Restrictions: No    Balance: Standardized Balance Assessment Standardized Balance Assessment: Berg Balance Test Berg Balance Test Sit to Stand: Able to stand  independently using hands Standing Unsupported: Able to stand 2 minutes with supervision Sitting with Back Unsupported but Feet Supported on Floor or Stool: Able to sit safely and securely 2 minutes Stand to Sit: Controls descent by using hands Transfers: Able to transfer with verbal cueing and /or supervision Standing Unsupported with Eyes Closed: Able to stand 10 seconds with supervision Standing Ubsupported with Feet Together: Needs help to attain position and unable to hold for 15 seconds From Standing, Reach Forward with Outstretched Arm: Can reach forward >12 cm safely (5") From Standing Position, Pick up Object from Floor: Able to pick up shoe, needs supervision From Standing Position, Turn to Look Behind Over each Shoulder: Needs assist to keep from losing balance and falling Turn 360 Degrees: Needs assistance while turning Standing Unsupported, Alternately Place Feet on Step/Stool: Needs assistance to keep from falling or unable to try Standing Unsupported, One Foot in Front: Loses balance while stepping or standing Standing on One Leg: Unable to try or needs assist to prevent fall Total Score: 24 See FIM for current functional status  Therapy/Group: Individual Therapy  Blenda Mounts  10/17/2014, 12:26 PM

## 2014-10-17 NOTE — Progress Notes (Signed)
Speech Language Pathology Daily Session Note  Patient Details  Name: Madison Todd MRN: [id number] Date of Birth: 06-Dec-1950  Today's Date: 10/17/2014 SLP Individual Time: 0160-1093 SLP Individual Time Calculation (min): 20 min  SLP Concurrent Time: 1325-1350 SLP Concurrent Time Calculation (min): 25 min   Short Term Goals: Week 3: SLP Short Term Goal 1 (Week 3): Pt will tolerate trials of thin liquids during a functional meal with minimal s/s of aspiration and mod I use of swallowing precautions.  SLP Short Term Goal 2 (Week 3): Pt will tolerate trials of advanced textures with no overt s/s of aspiration and supervision cues for use of swallowing precautions.   Skilled Therapeutic Interventions:  Pt was seen for skilled ST targeting dysphagia goals.  Pt received in the day room, upright in wheelchair, awake, alert, and agreeable to participate in [place].  SLP completed skilled observations with presentations of pt's currently prescribed diet.  Pt exhibited timely and efficient mastication of dys 3 solids with no residuals left in the oral cavity post swallow.  Pt exhibited strong immediate cough x1 follow cup sips of thin liquids which was likely due to suspected delayed swallow initiation.  Pt was supervision for use of swallowing precautions in a distracting environment and utilized teach back methods to educate her therapy partner on her swallowing strategies with supervision.  Continue per current plan of care.    FIM:  Comprehension Comprehension Mode: Auditory Comprehension: 6-Follows complex conversation/direction: With extra time/assistive device Expression Expression Mode: Verbal Expression: 6-Expresses complex ideas: With extra time/assistive device Social Interaction Social Interaction: 6-Interacts appropriately with others with medication or extra time (anti-anxiety, antidepressant). Problem Solving Problem Solving: 5-Solves basic 90% of the time/requires cueing < 10% of  the time Memory Memory: 6-More than reasonable amt of time FIM - Eating Eating Activity: 5: Supervision/cues  Pain Pain Assessment Pain Assessment: No/denies pain  Therapy/Group: Individual Therapy  [place] Beckford, Selinda Orion 10/17/2014, 4:04 PM

## 2014-10-18 ENCOUNTER — Inpatient Hospital Stay (HOSPITAL_COMMUNITY): Payer: Commercial Managed Care - HMO | Admitting: Speech Pathology

## 2014-10-18 ENCOUNTER — Inpatient Hospital Stay (HOSPITAL_COMMUNITY): Payer: Commercial Managed Care - HMO

## 2014-10-18 ENCOUNTER — Inpatient Hospital Stay (HOSPITAL_COMMUNITY): Payer: Commercial Managed Care - HMO | Admitting: Occupational Therapy

## 2014-10-18 LAB — GLUCOSE, CAPILLARY
GLUCOSE-CAPILLARY: 132 mg/dL — AB (ref 70–99)
Glucose-Capillary: 88 mg/dL (ref 70–99)
Glucose-Capillary: 165 mg/dL — ABNORMAL HIGH (ref 70–99)
Glucose-Capillary: 124 mg/dL — ABNORMAL HIGH (ref 70–99)

## 2014-10-18 NOTE — Progress Notes (Signed)
Physical Therapy Session Note  Patient Details  Name: Madison Todd MRN: [id number] Date of Birth: 1951/03/15  Today's Date: 10/18/2014 PT Individual Time: 1335-1405 PT Individual Time Calculation (min): 30 min   Short Term Goals: Week 3:  PT Short Term Goal 1 (Week 3): STG=LTG  Skilled Therapeutic Interventions/Progress Updates:    Pt received supine in bed. Pt reports dizziness 2/10 while EOB.  Therapeutic activity via task-specific training for household mobility: - transfers bed>w/c R/L x 2 with S  -reinforcement of R hand placement on hemi seat  - 2 reps in pt room; 2 reps in ADL apartment - Floor transfer training in ADL apartment  - bed>floor/Floor>bed with Min A 1st trial, S for 2nd trial  Neuromusuclar reeducation via multimodal cueing, forced use for recovery of function with gait: - gait x 30' with PT providing BUE support from front, R shoe lift 2" - gait x 10' with RW with (2) 5# ankle cuffs, R shoe lift 2", and overall S with occasional Min A for steadying A  - 1 cue for gaze stabilization - in hemi height chair, pt propelled using BUE and BLEs with S, demonstrating improved midline orientation  Pt tolerated treatment well. Pt reports dizziness at 2/10 during rest and 4/10 after activity. Continue to rotate use of eye patch; stability of R eye during conjugate eye movements does appear improved today.   Plan to adjust equipment order with CSW for 16 x 16 hemi height w/c and standard width RW. Plan for family ed with Nate tomorrow.  Therapy Documentation Precautions:  Precautions Precautions: Fall Precaution Comments: Rt lateral lean, double vision Restrictions Weight Bearing Restrictions: No Locomotion : Ambulation Ambulation/Gait Assistance: 4: Min assist (40) Wheelchair Mobility Distance: 150   See FIM for current functional status  Therapy/Group: Individual Therapy  Blenda Mounts 10/18/2014, 2:16 PM

## 2014-10-18 NOTE — Progress Notes (Signed)
Speech Language Pathology Daily Session Note  Patient Details  Name: Madison Todd MRN: [id number] Date of Birth: May 19, 1951  Today's Date: 10/18/2014 SLP Individual Time: 3212-2482 SLP Individual Time Calculation (min): 30 min  Short Term Goals: Week 3: SLP Short Term Goal 1 (Week 3): Pt will tolerate trials of thin liquids during a functional meal with minimal s/s of aspiration and mod I use of swallowing precautions.  SLP Short Term Goal 2 (Week 3): Pt will tolerate trials of advanced textures with no overt s/s of aspiration and supervision cues for use of swallowing precautions.   Skilled Therapeutic Interventions:  Pt was seen for skilled ST targeting dysphagia goals.  Upon arrival, pt was seated upright in wheelchair, awake, alert, and agreeable to participate in ST.  SLP facilitated the session with trials of regular water via straw.  Pt exhibited a strong, immediate reflexive cough x1 following large consecutive straw sips.  No overt s/s of aspiration were noted with small straw sips and pt demonstrated good awareness of the need for small, controlled straw sips.  Pt is cleared to consume thin liquids via straw.  Continue per current plan of care.    FIM:  Comprehension Comprehension Mode: Auditory Comprehension: 6-Follows complex conversation/direction: With extra time/assistive device Expression Expression Mode: Verbal Expression: 6-Expresses complex ideas: With extra time/assistive device Social Interaction Social Interaction: 6-Interacts appropriately with others with medication or extra time (anti-anxiety, antidepressant). Problem Solving Problem Solving: 6-Solves complex problems: With extra time Memory Memory: 6-More than reasonable amt of time FIM - Eating Eating Activity: 5: Supervision/cues  Pain Pain Assessment Pain Assessment: No/denies pain  Therapy/Group: Individual Therapy  Witt Plitt, Selinda Orion 10/18/2014, 7:34 PM

## 2014-10-18 NOTE — Progress Notes (Signed)
Subjective/Complaints: Feels tired this am, no new issues , discussed CBGs and vitals doing well   Review of Systems - Negative except swallowing and balance problems Objective: Vital Signs: Blood pressure 143/73, pulse 73, temperature 98.5 F (36.9 C), temperature source Oral, resp. rate 16, weight 69 kg (152 lb 1.9 oz), SpO2 93 %. No results found. Results for orders placed or performed during the hospital encounter of 10/01/14 (from the past 72 hour(s))  Glucose, capillary     Status: Abnormal   Collection Time: 10/15/14 11:29 AM  Result Value Ref Range   Glucose-Capillary 124 (H) 70 - 99 mg/dL  Glucose, capillary     Status: Abnormal   Collection Time: 10/15/14  4:35 PM  Result Value Ref Range   Glucose-Capillary 142 (H) 70 - 99 mg/dL  Glucose, capillary     Status: Abnormal   Collection Time: 10/15/14  8:21 PM  Result Value Ref Range   Glucose-Capillary 201 (H) 70 - 99 mg/dL  Glucose, capillary     Status: Abnormal   Collection Time: 10/16/14  7:13 AM  Result Value Ref Range   Glucose-Capillary 140 (H) 70 - 99 mg/dL  Urine culture     Status: None   Collection Time: 10/16/14  8:45 AM  Result Value Ref Range   Specimen Description URINE, CLEAN CATCH    Special Requests NONE    Colony Count NO GROWTH Performed at [facility]     Culture NO GROWTH Performed at [facility]     Report Status 10/17/2014 FINAL   Urinalysis, Routine w reflex microscopic     Status: Abnormal   Collection Time: 10/16/14  8:45 AM  Result Value Ref Range   Color, Urine YELLOW YELLOW   APPearance CLOUDY (A) CLEAR   Specific Gravity, Urine 1.017 1.005 - 1.030   pH 5.5 5.0 - 8.0   Glucose, UA NEGATIVE NEGATIVE mg/dL   Hgb urine dipstick NEGATIVE NEGATIVE   Bilirubin Urine NEGATIVE NEGATIVE   Ketones, ur NEGATIVE NEGATIVE mg/dL   Protein, ur NEGATIVE NEGATIVE mg/dL   Urobilinogen, UA 0.2 0.0 - 1.0 mg/dL   Nitrite NEGATIVE NEGATIVE   Leukocytes, UA TRACE (A) NEGATIVE   Urine microscopic-add on     Status: Abnormal   Collection Time: 10/16/14  8:45 AM  Result Value Ref Range   Squamous Epithelial / LPF FEW (A) RARE   WBC, UA 3-6 <3 WBC/hpf   RBC / HPF 0-2 <3 RBC/hpf   Bacteria, UA FEW (A) RARE  Glucose, capillary     Status: Abnormal   Collection Time: 10/16/14 11:37 AM  Result Value Ref Range   Glucose-Capillary 128 (H) 70 - 99 mg/dL  Glucose, capillary     Status: Abnormal   Collection Time: 10/16/14  4:21 PM  Result Value Ref Range   Glucose-Capillary 202 (H) 70 - 99 mg/dL  Glucose, capillary     Status: None   Collection Time: 10/16/14  9:07 PM  Result Value Ref Range   Glucose-Capillary 99 70 - 99 mg/dL   Comment 1 Notify RN   Glucose, capillary     Status: Abnormal   Collection Time: 10/17/14  6:28 AM  Result Value Ref Range   Glucose-Capillary 139 (H) 70 - 99 mg/dL   Comment 1 Notify RN   Glucose, capillary     Status: Abnormal   Collection Time: 10/17/14 11:36 AM  Result Value Ref Range   Glucose-Capillary 148 (H) 70 - 99 mg/dL  Glucose, capillary  Status: Abnormal   Collection Time: 10/17/14  3:52 PM  Result Value Ref Range   Glucose-Capillary 182 (H) 70 - 99 mg/dL  Glucose, capillary     Status: Abnormal   Collection Time: 10/17/14  8:57 PM  Result Value Ref Range   Glucose-Capillary 120 (H) 70 - 99 mg/dL   Comment 1 Notify RN   Glucose, capillary     Status: Abnormal   Collection Time: 10/18/14  6:05 AM  Result Value Ref Range   Glucose-Capillary 132 (H) 70 - 99 mg/dL   Comment 1 Notify RN      HEENT: normal Cardio: RRR and no murmur Resp: CTA B/L and difficulty clearing secretions GI: BS positive and NT, ND Extremity:  No Edema Skin:   Intact Neuro: Alert/Oriented, Cranial Nerve Abnormalities R V1,2,3 sensory, Abnormal Sensory reduced in LUE and LLE, Abnormal FMC Ataxic/ dec FMC , motor 4+/5 in BUE and BLE Musc/Skel:  Normal Gen NAD, voice quality hoarse   Assessment/Plan: 1. 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 Physiatrist is providing close team supervision and 24 hour management of active medical problems listed below. Physiatrist and rehab team continue to assess barriers to discharge/monitor patient progress toward functional and medical goals.  FIM: FIM - Bathing Bathing Steps Patient Completed: Chest, Right Arm, Left Arm, Abdomen, Front perineal area, Buttocks, Right upper leg, Left upper leg, Right lower leg (including foot), Left lower leg (including foot) Bathing: 5: Supervision: Safety issues/verbal cues  FIM - Upper Body Dressing/Undressing Upper body dressing/undressing steps patient completed: Thread/unthread right sleeve of pullover shirt/dresss, Thread/unthread left sleeve of pullover shirt/dress, Pull shirt over trunk, Put head through opening of pull over shirt/dress Upper body dressing/undressing: 5: Set-up assist to: Obtain clothing/put away FIM - Lower Body Dressing/Undressing Lower body dressing/undressing steps patient completed: Thread/unthread right pants leg, Thread/unthread left pants leg, Pull pants up/down, Don/Doff right sock, Don/Doff left sock, Thread/unthread right underwear leg, Thread/unthread left underwear leg, Pull underwear up/down, Don/Doff right shoe, Don/Doff left shoe, Fasten/unfasten right shoe, Fasten/unfasten left shoe Lower body dressing/undressing: 5: Supervision: Safety issues/verbal cues  FIM - Toileting Toileting steps completed by patient: Adjust clothing prior to toileting, Performs perineal hygiene, Adjust clothing after toileting Toileting Assistive Devices: Grab bar or rail for support Toileting: 5: Supervision: Safety issues/verbal cues  FIM - Radio producer Devices: Grab bars Toilet Transfers: 5-From toilet/BSC: Supervision (verbal cues/safety issues), 5-To  toilet/BSC: Supervision (verbal cues/safety issues)  FIM - Control and instrumentation engineer Devices: Arm rests Bed/Chair Transfer: 5: Bed > Chair or W/C: Supervision (verbal cues/safety issues)  FIM - Locomotion: Wheelchair Distance: 150 Locomotion: Wheelchair: 0: Activity did not occur FIM - Locomotion: Ambulation Locomotion: Ambulation Assistive Devices: Administrator, Other (comment) Ambulation/Gait Assistance: 3: Mod assist Locomotion: Ambulation: 1: Travels less than 50 ft with moderate assistance (Pt: 50 - 74%)  Comprehension Comprehension Mode: Auditory Comprehension: 6-Follows complex conversation/direction: With extra time/assistive device  Expression Expression Mode: Verbal Expression: 6-Expresses complex ideas: With extra time/assistive device  Social Interaction Social Interaction: 6-Interacts appropriately with others with medication or extra time (anti-anxiety, antidepressant).  Problem Solving Problem Solving: 5-Solves basic 90% of the time/requires cueing < 10% of the time  Memory Memory: 6-More than reasonable amt of time  Medical Problem List and Plan: 1. 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 DVT Prophylaxis/Anticoagulation: SQ  Lovenox .Monitor platelet and any signs of bleeding 3. Pain Management: Tylenol 4. Dysphagia. D2 nectar thick liquids. Follow-up speech therapy.  continue low rate IVF HS until liquids advanced 5. Neuropsych: This patient is capable of making decisions on her own behalf. 6. Skin/Wound Care: Routine skin checks 7. Fluids/Electrolytes/Nutrition: Strict I & O.Follow up labs 8.Hypertension. No present antihypertensive medication. Patient on Toprol 50 mg daily prior to admission as well as Tribenzor 40-5-12.5 mg daily., start amlodipine to slowly start bringing down systolic BP  -resumed toprol at 25mg  daily 9.diabetes mellitus with peripheral neuropathy. Patient  with hemoglobin A1c 8.9. Sliding scale insulin. Check blood sugars before meals and at bedtime. Patient on Glucophage 850 mg BID (home took 500mg ) -diabetic education  -some improvement with glucophage---- 10.Hyperlipidemia.lipitor 11.COPD/tobacco abuse.Counseling  12.HX Bilat. breast cancer.Follow up oncology out patient 13.  Hypokalemia- may be IV related now on po K 14.  "weakness"  Urinary incont x 1 will check UA, neuro exam stable LOS (Days) 17 A FACE TO FACE EVALUATION WAS PERFORMED  Madison Todd E 10/18/2014, 7:15 AM

## 2014-10-18 NOTE — Progress Notes (Signed)
Physical Therapy Session Note  Patient Details  Name: Madison Todd MRN: [id number] Date of Birth: 10-10-1950  Today's Date: 10/18/2014 PT Individual Time: 1100-1205 PT Individual Time Calculation (min): 65 min   Short Term Goals: Week 3:  PT Short Term Goal 1 (Week 3): STG=LTG  Skilled Therapeutic Interventions/Progress Updates:     Pt received supine in bed. Pt states that dizziness is 4/10, with no increase during tx. Pt also reported that she felt like her R eye was better focused today.  Neuromuscular reeducation via faded use of multimodal cueing and task breakdown for increased self-correction and orientation to midline: - Pt propelled wc to therapy gym, demonstrated improved midline orientation while seated in hemi height w/c - Dynamic seated balance in unsupported sitting at edge of mat x 5 min; pt demonstrated decreased BUE reliance. - Transfer training via biased weight shift to L  -  x 6 reps, reaching back with L hand  -  Pt had 1 LOB to R during transitional turning movements requiring Mod A and pt stepping response to prevent fall - Bridges with progression of reduced UE support > eccentric resistance at hips  - 5 sets of 5  Gait training with RW for functional household mobility: x 30' with Min A with bari wide RW, x 71' with Min A with standard RW (2) 5# cuff weights. During standing rest breaks, pt requires occasional Mod A due to drift to R. - both gait trials with eye patch over R eye and R shoe lift 2" - Pt had 2 LOB to R during transitional turning movements requiring Max A to prevent fall - Stairs ascend/descend x 5 with Min A with 2 rails  Pt fits well in 16 x 16, 17.5" high (hemi) w/c. CSW informed.   Plan for family ed with son on Friday, including discussion of pt's memory status PTA.  Therapy Documentation Precautions:  Precautions Precautions: Fall Precaution Comments: Rt lateral lean, double vision Restrictions Weight Bearing Restrictions:  No Locomotion : Ambulation Ambulation/Gait Assistance: 3: Mod assist;4: Min assist (LOB to R requires Max A to prevent fall during transitional movements) Wheelchair Mobility Distance: 150   See FIM for current functional status  Therapy/Group: Individual Therapy  Blenda Mounts 10/18/2014, 12:31 PM

## 2014-10-18 NOTE — Progress Notes (Signed)
Occupational Therapy Session Note  Patient Details  Name: Madison Todd MRN: [id number] Date of Birth: 12-05-1950  Today's Date: 10/18/2014 OT Individual Time: 0800-0900 OT Individual Time Calculation (min): 60 min    Short Term Goals: Week 1:  OT Short Term Goal 1 (Week 1): Pt will complete toilet transfer with min assist OT Short Term Goal 1 - Progress (Week 1): Met OT Short Term Goal 2 (Week 1): Pt will complete bathing with min assist for standing balance OT Short Term Goal 2 - Progress (Week 1): Met OT Short Term Goal 3 (Week 1): Pt will complete UB dressing with min assist OT Short Term Goal 3 - Progress (Week 1): Met OT Short Term Goal 4 (Week 1): Pt will complete 2 grooming tasks in standing with min assist for standing balance OT Short Term Goal 4 - Progress (Week 1): Met Week 2:  OT Short Term Goal 1 (Week 2): Pt will complete bathing with Supervision. OT Short Term Goal 1 - Progress (Week 2): Progressing toward goal OT Short Term Goal 2 (Week 2): Pt will complete LB dressing with Supervision. OT Short Term Goal 2 - Progress (Week 2): Progressing toward goal OT Short Term Goal 3 (Week 2): Pt will complete squat pivot transfer to toilet with steadying A. OT Short Term Goal 3 - Progress (Week 2): Met OT Short Term Goal 4 (Week 2): Pt will be able to sit to stand with steadying A without leaning to R to prepare for LB dressing. OT Short Term Goal 4 - Progress (Week 2): Met OT Short Term Goal 5 (Week 2): Pt will complete simulated tub bench transfer in tub (with clothes on) with steadying A w/c to tub bench. OT Short Term Goal 5 - Progress (Week 2): Met Week 3:  OT Short Term Goal 1 (Week 3): Pt will bathe with supervision demonstrating improved sitting balance on tub bench. OT Short Term Goal 2 (Week 3): Pt will dress LB with supervision demonstrating improved trunk control to prevent leaning to the R. OT Short Term Goal 3 (Week 3): Pt will complete a stand pivot transfer  with a RW with min A.  Skilled Therapeutic Interventions/Progress Updates:    Pt seen for BADL retraining with a focus on activity tolerance and standing balance. Pt stated she was extremely tired today and needed frequent and longer rest breaks. She ambulated 6 steps from EOB to W/C with max A using B hand hold A.  From chair pt completed bathing at sink demonstrating steady balance in standing to wash bottom and then with pulling up underwear. When she pulled up her pants, she had a LOB to her right with fall prevented by this clinician. Pt stated she did not realize how far over she was leaning.  Pt's son remeasured her bathroom doorways at 27 inches. This would allow a w/c to fit into the bathroom. Pt is currently in a 18 in wide w/c. Pt provided with a 16 inch wide chair that supports her well. Pt spent time working on w/c propulsion and turns in new chair and is able to maneuver the chair more easily. Spoke with her PT about ordering the more narrow chair for easier navigation in her home.  Pt returned to her room at the end of the session.  Therapy Documentation Precautions:  Precautions Precautions: Fall Precaution Comments: Rt lateral lean, double vision Restrictions Weight Bearing Restrictions: No    Vital Signs: Therapy Vitals Temp: 98.5 F (36.9 C) Temp Source:  Oral Pulse Rate: 73 Resp: 16 BP: (!) 143/73 mmHg Oxygen Therapy SpO2: 93 % O2 Device: Not Delivered Pain: Pain Assessment Pain Assessment: No/denies pain ADL:  See FIM for current functional status  Therapy/Group: Individual Therapy  Madison Todd 10/18/2014, 8:22 AM

## 2014-10-19 ENCOUNTER — Ambulatory Visit (HOSPITAL_COMMUNITY): Payer: Commercial Managed Care - HMO | Admitting: *Deleted

## 2014-10-19 ENCOUNTER — Encounter (HOSPITAL_COMMUNITY): Payer: Commercial Managed Care - HMO | Admitting: Speech Pathology

## 2014-10-19 ENCOUNTER — Inpatient Hospital Stay (HOSPITAL_COMMUNITY): Payer: Commercial Managed Care - HMO | Admitting: Occupational Therapy

## 2014-10-19 LAB — GLUCOSE, CAPILLARY
GLUCOSE-CAPILLARY: 133 mg/dL — AB (ref 70–99)
GLUCOSE-CAPILLARY: 163 mg/dL — AB (ref 70–99)
GLUCOSE-CAPILLARY: 241 mg/dL — AB (ref 70–99)
Glucose-Capillary: 142 mg/dL — ABNORMAL HIGH (ref 70–99)

## 2014-10-19 MED ORDER — GEMFIBROZIL 600 MG PO TABS
600.0000 mg | ORAL_TABLET | Freq: Two times a day (BID) | ORAL | Status: DC
  Administered 2014-10-20: 600 mg via ORAL
  Filled 2014-10-19 (×8): qty 1

## 2014-10-19 MED ORDER — SENNOSIDES-DOCUSATE SODIUM 8.6-50 MG PO TABS
1.0000 | ORAL_TABLET | Freq: Every day | ORAL | Status: DC
  Administered 2014-10-19 – 2014-10-22 (×2): 1 via ORAL
  Filled 2014-10-19 (×5): qty 1

## 2014-10-19 NOTE — Progress Notes (Signed)
Speech Language Pathology Discharge Summary  Patient Details  Name: Madison Todd MRN: [id number] Date of Birth: Apr 24, 1951  Today's Date: 10/19/2014 SLP Individual Time: 0900-1000 SLP Individual Time Calculation (min): 60 min   Skilled Therapeutic Interventions:  Pt was seen for skilled ST targeting family education.  Upon arrival, pt was seated upright in wheelchair, awake, alert,and agreeable to participate in [place].  Pt's son was present at bedside.  SLP provided skilled education regarding rationale behind pt's currently prescribed diet and swallowing precautions.  SLP also facilitated the session with trials of regular textures to assess readiness for diet upgrade.  Pt presented with adequate mastication and oral clearance of regular solids; no overt s/s of aspiration were noted when regular textures were followed with straw sips of thin liquids, although pt exhibited x1 immediate, reflexive cough following a large sip of thin liquids via straw.  Suspect that pt is ready for a diet upgrade to regular solids; however, pt reported that she would prefer to stay on her current diet of soft solids due to premorbid issues with dentition and needing dental work.  Educated pt that she may be advanced to regular textures as tolerated and provided her with a handout of soft solids to assist in making food selections at home.  SLP does not recommend further ST services at this time given that pt is tolerating her preferred diet and is utilizing swallowing strategies with mod I.  Pt and son are in agreement.      Patient has met 3 of 3 long term goals.  Patient to discharge at overall Modified Independent level.  Reasons goals not met: n/a   Clinical Impression/Discharge Summary:  Pt made functional gains while inpatient and is discharging from [place] services having met 3 out of 3 long term goals.  Pt is currently tolerating dys 3 solids with thin liquids and is mod I for use of swallowing precautions to  minimize overt s/s of aspiration.  Pt requested to defer diet upgrade to regular solids at this time due to needing dental work but may be advanced as tolerated.  Pt and family education is complete at this time.  No further ST needs are indicated.    Care Partner:  Caregiver Able to Provide Assistance: Yes  Type of Caregiver Assistance: Physical  Recommendation:  None      Equipment: none recommended by SLP    Reasons for discharge: Treatment goals met   Patient/Family Agrees with Progress Made and Goals Achieved: Yes   See FIM for current functional status  Emilio Math 10/19/2014, 10:29 AM

## 2014-10-19 NOTE — Progress Notes (Signed)
Subjective/Complaints: Pt was practicing floor transfers yesterday . No new issues   Review of Systems - Negative except swallowing and balance problems Objective: Vital Signs: Blood pressure 123/71, pulse 78, temperature 98.1 F (36.7 C), temperature source Oral, resp. rate 17, weight 69 kg (152 lb 1.9 oz), SpO2 95 %. No results found. Results for orders placed or performed during the hospital encounter of 10/01/14 (from the past 72 hour(s))  Urine culture     Status: None   Collection Time: 10/16/14  8:45 AM  Result Value Ref Range   Specimen Description URINE, CLEAN CATCH    Special Requests NONE    Colony Count NO GROWTH Performed at [facility]     Culture NO GROWTH Performed at [facility]     Report Status 10/17/2014 FINAL   Urinalysis, Routine w reflex microscopic     Status: Abnormal   Collection Time: 10/16/14  8:45 AM  Result Value Ref Range   Color, Urine YELLOW YELLOW   APPearance CLOUDY (A) CLEAR   Specific Gravity, Urine 1.017 1.005 - 1.030   pH 5.5 5.0 - 8.0   Glucose, UA NEGATIVE NEGATIVE mg/dL   Hgb urine dipstick NEGATIVE NEGATIVE   Bilirubin Urine NEGATIVE NEGATIVE   Ketones, ur NEGATIVE NEGATIVE mg/dL   Protein, ur NEGATIVE NEGATIVE mg/dL   Urobilinogen, UA 0.2 0.0 - 1.0 mg/dL   Nitrite NEGATIVE NEGATIVE   Leukocytes, UA TRACE (A) NEGATIVE  Urine microscopic-add on     Status: Abnormal   Collection Time: 10/16/14  8:45 AM  Result Value Ref Range   Squamous Epithelial / LPF FEW (A) RARE   WBC, UA 3-6 <3 WBC/hpf   RBC / HPF 0-2 <3 RBC/hpf   Bacteria, UA FEW (A) RARE  Glucose, capillary     Status: Abnormal   Collection Time: 10/16/14 11:37 AM  Result Value Ref Range   Glucose-Capillary 128 (H) 70 - 99 mg/dL  Glucose, capillary     Status: Abnormal   Collection Time: 10/16/14  4:21 PM  Result Value Ref Range   Glucose-Capillary 202 (H) 70 - 99 mg/dL  Glucose, capillary     Status: None   Collection Time: 10/16/14  9:07 PM   Result Value Ref Range   Glucose-Capillary 99 70 - 99 mg/dL   Comment 1 Notify RN   Glucose, capillary     Status: Abnormal   Collection Time: 10/17/14  6:28 AM  Result Value Ref Range   Glucose-Capillary 139 (H) 70 - 99 mg/dL   Comment 1 Notify RN   Glucose, capillary     Status: Abnormal   Collection Time: 10/17/14 11:36 AM  Result Value Ref Range   Glucose-Capillary 148 (H) 70 - 99 mg/dL  Glucose, capillary     Status: Abnormal   Collection Time: 10/17/14  3:52 PM  Result Value Ref Range   Glucose-Capillary 182 (H) 70 - 99 mg/dL  Glucose, capillary     Status: Abnormal   Collection Time: 10/17/14  8:57 PM  Result Value Ref Range   Glucose-Capillary 120 (H) 70 - 99 mg/dL   Comment 1 Notify RN   Glucose, capillary     Status: Abnormal   Collection Time: 10/18/14  6:05 AM  Result Value Ref Range   Glucose-Capillary 132 (H) 70 - 99 mg/dL   Comment 1 Notify RN   Glucose, capillary     Status: None   Collection Time: 10/18/14 12:11 PM  Result Value Ref Range  Glucose-Capillary 88 70 - 99 mg/dL  Glucose, capillary     Status: Abnormal   Collection Time: 10/18/14  4:39 PM  Result Value Ref Range   Glucose-Capillary 165 (H) 70 - 99 mg/dL  Glucose, capillary     Status: Abnormal   Collection Time: 10/18/14  9:25 PM  Result Value Ref Range   Glucose-Capillary 124 (H) 70 - 99 mg/dL   Comment 1 Notify RN   Glucose, capillary     Status: Abnormal   Collection Time: 10/19/14  6:57 AM  Result Value Ref Range   Glucose-Capillary 133 (H) 70 - 99 mg/dL   Comment 1 Notify RN      HEENT: normal Cardio: RRR and no murmur Resp: CTA B/L and difficulty clearing secretions GI: BS positive and NT, ND Extremity:  No Edema Skin:   Intact Neuro: Alert/Oriented, Cranial Nerve Abnormalities R V1,2,3 sensory, Abnormal Sensory reduced in LUE and LLE, Abnormal FMC Ataxic/ dec FMC , motor 4+/5 in BUE and BLE Musc/Skel:  Normal Gen NAD, voice quality hoarse   Assessment/Plan: 1. 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 Physiatrist is providing close team supervision and 24 hour management of active medical problems listed below. Physiatrist and rehab team continue to assess barriers to discharge/monitor patient progress toward functional and medical goals.  FIM: FIM - Bathing Bathing Steps Patient Completed: Chest, Right Arm, Left Arm, Abdomen, Front perineal area, Buttocks, Right upper leg, Left upper leg, Right lower leg (including foot), Left lower leg (including foot) Bathing: 5: Supervision: Safety issues/verbal cues  FIM - Upper Body Dressing/Undressing Upper body dressing/undressing steps patient completed: Thread/unthread right sleeve of pullover shirt/dresss, Thread/unthread left sleeve of pullover shirt/dress, Pull shirt over trunk, Put head through opening of pull over shirt/dress Upper body dressing/undressing: 5: Set-up assist to: Obtain clothing/put away FIM - Lower Body Dressing/Undressing Lower body dressing/undressing steps patient completed: Thread/unthread right pants leg, Thread/unthread left pants leg, Pull pants up/down, Don/Doff right sock, Don/Doff left sock, Thread/unthread right underwear leg, Thread/unthread left underwear leg, Pull underwear up/down, Don/Doff right shoe, Don/Doff left shoe, Fasten/unfasten right shoe, Fasten/unfasten left shoe Lower body dressing/undressing: 4: Steadying Assist  FIM - Toileting Toileting steps completed by patient: Performs perineal hygiene, Adjust clothing prior to toileting Toileting Assistive Devices: Grab bar or rail for support Toileting: 5: Supervision: Safety issues/verbal cues  FIM - Radio producer Devices: Grab bars Toilet Transfers: 5-To toilet/BSC: Supervision (verbal cues/safety issues), 5-From toilet/BSC: Supervision (verbal  cues/safety issues)  FIM - Control and instrumentation engineer Devices: Arm rests Bed/Chair Transfer: 5: Supine > Sit: Supervision (verbal cues/safety issues), 5: Bed > Chair or W/C: Supervision (verbal cues/safety issues), 5: Chair or W/C > Bed: Supervision (verbal cues/safety issues)  FIM - Locomotion: Wheelchair Distance: 150 Locomotion: Wheelchair: 5: Travels 150 ft or more: maneuvers on rugs and over door sills with supervision, cueing or coaxing FIM - Locomotion: Ambulation Locomotion: Ambulation Assistive Devices: Environmental consultant - Rolling, Other (comment) (RW with R shoe build up) Ambulation/Gait Assistance: 4: Min assist (40) Locomotion: Ambulation: 1: Travels less than 50 ft with minimal assistance (Pt.>75%)  Comprehension Comprehension Mode: Auditory Comprehension: 6-Follows complex conversation/direction: With extra time/assistive device  Expression Expression Mode: Verbal Expression: 6-Expresses complex ideas: With extra time/assistive device  Social Interaction Social Interaction: 6-Interacts appropriately with others with medication or extra time (anti-anxiety, antidepressant).  Problem Solving Problem Solving: 6-Solves complex  problems: With extra time  Memory Memory: 7-Complete Independence: No helper  Medical Problem List and Plan: 1. 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 DVT Prophylaxis/Anticoagulation: SQ Lovenox .Monitor platelet and any signs of bleeding 3. Pain Management: Tylenol 4. Dysphagia. D2 nectar thick liquids. Follow-up speech therapy.  continue low rate IVF HS until liquids advanced 5. Neuropsych: This patient is capable of making decisions on her own behalf. 6. Skin/Wound Care: Routine skin checks 7. Fluids/Electrolytes/Nutrition: Strict I & O.Follow up labs 8.Hypertension. No present antihypertensive medication. Patient on Toprol 50 mg daily prior to admission as well as Tribenzor  40-5-12.5 mg daily., start amlodipine to slowly start bringing down systolic BP  -resumed toprol at 25mg  daily 9.diabetes mellitus with peripheral neuropathy.Now controlled Patient with hemoglobin A1c 8.9. Sliding scale insulin. Check blood sugars before meals and at bedtime. Patient on Glucophage 850 mg BID (home took 500mg ) -diabetic education  -some improvement with glucophage---- 10.Hyperlipidemia.lipitor 11.COPD/tobacco abuse.Counseling  12.HX Bilat. breast cancer.Follow up oncology out patient 13.  Hypokalemia- may be IV related now on po K , recheck BMET  LOS (Days) [place] 10/19/2014, 7:15 AM

## 2014-10-19 NOTE — Progress Notes (Signed)
Physical Therapy Session Note  Patient Details  Name: Madison Todd MRN: [id number] Date of Birth: [birth year]  Today's Date: 10/19/2014 PT Individual Time: 1100-1210 PT Individual Time Calculation (min): 70 min   Short Term Goals: Week 3:  PT Short Term Goal 1 (Week 3): STG=LTG  Skilled Therapeutic Interventions/Progress Updates:    Pt received for PT in ADL apartment following OT session. Pt reports dizziness is 4/10. Emphasis today on family training for household mobility. Pt's son, Zenaida Deed, was present for PT and was able to return demonstrate all mobility tasks with appropriate precautions and pt cues.  Therapeutic activity via transfer training for household mobility. Pt demonstrated R/L furniture>>w/c transfers with S. Discussion of sequence for floor transfers and safety planning in event of a fall at home.  Therapeutic exercise via specific strengthening exercises for improved orientation to midline. PT demonstrated functional progression of supine bridges in bed to pt's son. PT and pt demonstrated vestibular exercises for occulomotor strengthening to pt's son via x1 viewing and smooth pursuits, with and without eye patch. PT explained and demonstrated use of eye patch with exercises and with functional mobility, and pt's son verbalized understanding.  Gait training with RW and (2) 5# cuff weights x 50' with Min A; helper positioned to L to encourage L weight shift. Second trial of gait x 50' with no AD and Mod A; helper positioned to R due to pt's R lean. Curb training + threshold performed with no device to simulate home entry; helper positioned behind and diagonally R to pt with arm around waist for support. Pt and son advised to use weighted RW at all times for safety.   Pt was propelled to room, all needs within reach. Family did not have further questions at this time.  CSW was informed that Nate brought a RW from home, which was used today during therapy with (2) 5# cuff  weights. Nate understands the function of weighting the RW. CSW DC'd equipment order for RW.   Therapy Documentation Precautions:  Precautions Precautions: Fall Precaution Comments: Rt lateral lean, double vision Restrictions Weight Bearing Restrictions: No Pain: Pain Assessment Pain Assessment: No/denies pain Locomotion : Ambulation Ambulation/Gait Assistance: 4: Min assist   See FIM for current functional status  Therapy/Group: Individual Therapy  Blenda Mounts 10/19/2014, 12:30 PM

## 2014-10-19 NOTE — Progress Notes (Signed)
Social Work Patient ID: Madison Todd, female   DOB: [birth year], 64 y.o.   MRN: [id number] Met with pt and son who was here for family education, it went well and son feels confident regarding pt's care needs at home. Discussed tub bench is private pay and still wanted to get one, referral made to [facility] and delivered while son was here.  Rest of equipment was Ordered through [employer]-wheelchair & bedside commode.  Will work toward discharge Tuesday.

## 2014-10-19 NOTE — Progress Notes (Signed)
Occupational Therapy Session Note  Patient Details  Name: Madison Todd MRN: [id number] Date of Birth: 06-Jan-1951  Today's Date: 10/19/2014 OT Individual Time: 1000-1100 OT Individual Time Calculation (min): 60 min    Short Term Goals: Week 1:  OT Short Term Goal 1 (Week 1): Pt will complete toilet transfer with min assist OT Short Term Goal 1 - Progress (Week 1): Met OT Short Term Goal 2 (Week 1): Pt will complete bathing with min assist for standing balance OT Short Term Goal 2 - Progress (Week 1): Met OT Short Term Goal 3 (Week 1): Pt will complete UB dressing with min assist OT Short Term Goal 3 - Progress (Week 1): Met OT Short Term Goal 4 (Week 1): Pt will complete 2 grooming tasks in standing with min assist for standing balance OT Short Term Goal 4 - Progress (Week 1): Met Week 2:  OT Short Term Goal 1 (Week 2): Pt will complete bathing with Supervision. OT Short Term Goal 1 - Progress (Week 2): Progressing toward goal OT Short Term Goal 2 (Week 2): Pt will complete LB dressing with Supervision. OT Short Term Goal 2 - Progress (Week 2): Progressing toward goal OT Short Term Goal 3 (Week 2): Pt will complete squat pivot transfer to toilet with steadying A. OT Short Term Goal 3 - Progress (Week 2): Met OT Short Term Goal 4 (Week 2): Pt will be able to sit to stand with steadying A without leaning to R to prepare for LB dressing. OT Short Term Goal 4 - Progress (Week 2): Met OT Short Term Goal 5 (Week 2): Pt will complete simulated tub bench transfer in tub (with clothes on) with steadying A w/c to tub bench. OT Short Term Goal 5 - Progress (Week 2): Met Week 3:  OT Short Term Goal 1 (Week 3): Pt will bathe with supervision demonstrating improved sitting balance on tub bench. OT Short Term Goal 2 (Week 3): Pt will dress LB with supervision demonstrating improved trunk control to prevent leaning to the R. OT Short Term Goal 3 (Week 3): Pt will complete a stand pivot transfer  with a RW with min A.  Skilled Therapeutic Interventions/Progress Updates:    Pt seen for BADL retraining with family education with her son. Pt gathered her supplies to bathe in tub room. Educated her son on need for tub bench, transfer techniques, close S needed with balance. Pt demonstrated her ability to squat pivot to and from the w/c to the tub bench with supervision. This same transfer is used to the [name]. Pt sometimes needs cues to fully lean forward. Repeated the transfer 3x with her son providing the close S.  Pt is able to bathe using lateral leans on bench so she is not doing any standing in the shower at this time.  She has good balance and can bathe with S outside of the curtain.  She dried off and then transferred back to her chair. Worked on standing with RW with cues to find her midline and very close S. Madison Todd, her son, stood on her R as she adjusted her pants over her hips. Today she did not need any steadying A as she moved slower and focused on her balance.  Pt returned to her room to gather her clothing starting her laundry. Pt went to laundry room but washer occupied at the time.  Pt then went to the kitchen to demonstrate to Madison Todd simple standing balance exercise she can work on at home,  such as heel raises, mini squats, side lunges to the R with close S. Pt's PT arrived for her next session.  Therapy Documentation Precautions:  Precautions Precautions: Fall Precaution Comments: Rt lateral lean, double vision Restrictions Weight Bearing Restrictions: No    Vital Signs: Therapy Vitals Temp: 97.7 F (36.5 C) Temp Source: Oral Pulse Rate: 65 Resp: 17 BP: 122/72 mmHg Patient Position (if appropriate): Lying Oxygen Therapy SpO2: 96 % O2 Device: Not Delivered   Pain: no c/o pain   ADL:  See FIM for current functional status  Therapy/Group: Individual Therapy  [place] 10/19/2014, 9:47 AM

## 2014-10-20 ENCOUNTER — Inpatient Hospital Stay (HOSPITAL_COMMUNITY): Payer: Commercial Managed Care - HMO | Admitting: *Deleted

## 2014-10-20 LAB — GLUCOSE, CAPILLARY
Glucose-Capillary: 133 mg/dL — ABNORMAL HIGH (ref 70–99)
Glucose-Capillary: 136 mg/dL — ABNORMAL HIGH (ref 70–99)
Glucose-Capillary: 153 mg/dL — ABNORMAL HIGH (ref 70–99)
Glucose-Capillary: 166 mg/dL — ABNORMAL HIGH (ref 70–99)

## 2014-10-20 NOTE — Progress Notes (Signed)
Physical Therapy Session Note  Patient Details  Name: Madison Todd MRN: [id number] Date of Birth: 04/22/1951  Today's Date: 10/20/2014 PT Individual Time: 4098-1191 PT Individual Time Calculation (min): 45 min   Short Term Goals: Week 3:  PT Short Term Goal 1 (Week 3): STG=LTG  Skilled Therapeutic Interventions/Progress Updates:    Patient received sitting in wheelchair. Session focused on wheelchair mobility in controlled and community settings. Patient propelled wheelchair in community setting >300' (main hospital lobby and gift shop) as well as 20' x2 in controlled environment on rehab unit with B UE and supervision/verbal cues for obstacle negotiation in confined spaces. Emphasis on weaving in/out of tables/chairs in front of Ward. Patient returned to room and transferred wheelchair>bed via stand pivot and supervision/verbal cues for safety as patient attempting to reach for bedside table. Patient left supine in bed with bed alarm on and all needs within reach.  Therapy Documentation Precautions:  Precautions Precautions: Fall Precaution Comments: Rt lateral lean, double vision Restrictions Weight Bearing Restrictions: No Pain: Pain Assessment Pain Assessment: No/denies pain Pain Score: 0-No pain Locomotion : Wheelchair Mobility Distance: 300   See FIM for current functional status  Therapy/Group: Individual Therapy  Lillia Abed. Minnie Legros, PT, DPT 10/20/2014, 2:53 PM

## 2014-10-20 NOTE — Progress Notes (Signed)
Subjective/Complaints: Discussed weakness Still with R facial numbness Picking wax out of R ear canal   Review of Systems - Negative except swallowing and balance problems Objective: Vital Signs: Blood pressure 135/80, pulse 69, temperature 98.2 F (36.8 C), temperature source Oral, resp. rate 17, weight 69 kg (152 lb 1.9 oz), SpO2 95 %. No results found. Results for orders placed or performed during the hospital encounter of 10/01/14 (from the past 72 hour(s))  Glucose, capillary     Status: Abnormal   Collection Time: 10/17/14  6:28 AM  Result Value Ref Range   Glucose-Capillary 139 (H) 70 - 99 mg/dL   Comment 1 Notify RN   Glucose, capillary     Status: Abnormal   Collection Time: 10/17/14 11:36 AM  Result Value Ref Range   Glucose-Capillary 148 (H) 70 - 99 mg/dL  Glucose, capillary     Status: Abnormal   Collection Time: 10/17/14  3:52 PM  Result Value Ref Range   Glucose-Capillary 182 (H) 70 - 99 mg/dL  Glucose, capillary     Status: Abnormal   Collection Time: 10/17/14  8:57 PM  Result Value Ref Range   Glucose-Capillary 120 (H) 70 - 99 mg/dL   Comment 1 Notify RN   Glucose, capillary     Status: Abnormal   Collection Time: 10/18/14  6:05 AM  Result Value Ref Range   Glucose-Capillary 132 (H) 70 - 99 mg/dL   Comment 1 Notify RN   Glucose, capillary     Status: None   Collection Time: 10/18/14 12:11 PM  Result Value Ref Range   Glucose-Capillary 88 70 - 99 mg/dL  Glucose, capillary     Status: Abnormal   Collection Time: 10/18/14  4:39 PM  Result Value Ref Range   Glucose-Capillary 165 (H) 70 - 99 mg/dL  Glucose, capillary     Status: Abnormal   Collection Time: 10/18/14  9:25 PM  Result Value Ref Range   Glucose-Capillary 124 (H) 70 - 99 mg/dL   Comment 1 Notify RN   Glucose, capillary     Status: Abnormal   Collection Time: 10/19/14  6:57 AM  Result Value Ref Range   Glucose-Capillary 133 (H) 70 - 99 mg/dL   Comment 1 Notify RN   Glucose, capillary      Status: Abnormal   Collection Time: 10/19/14 11:51 AM  Result Value Ref Range   Glucose-Capillary 142 (H) 70 - 99 mg/dL  Glucose, capillary     Status: Abnormal   Collection Time: 10/19/14  4:26 PM  Result Value Ref Range   Glucose-Capillary 163 (H) 70 - 99 mg/dL  Glucose, capillary     Status: Abnormal   Collection Time: 10/19/14  9:11 PM  Result Value Ref Range   Glucose-Capillary 241 (H) 70 - 99 mg/dL   Comment 1 Notify RN   Glucose, capillary     Status: Abnormal   Collection Time: 10/20/14  6:14 AM  Result Value Ref Range   Glucose-Capillary 133 (H) 70 - 99 mg/dL   Comment 1 Notify RN      HEENT: normal, no cerumen seen on out ext auditory canal ( no otoscope) Cardio: RRR and no murmur Resp: CTA B/L  GI: BS positive and NT, ND Extremity:  No Edema Skin:   Intact Neuro: Alert/Oriented, Cranial Nerve Abnormalities R V1,2,3 sensory, Abnormal Sensory reduced in LUE and LLE, Abnormal FMC Ataxic/ dec FMC , motor 4+/5 in BUE and BLE Musc/Skel:  Normal Gen NAD, voice quality  hoarse   Assessment/Plan: 1. 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 Physiatrist is providing close team supervision and 24 hour management of active medical problems listed below. Physiatrist and rehab team continue to assess barriers to discharge/monitor patient progress toward functional and medical goals.  FIM: FIM - Bathing Bathing Steps Patient Completed: Chest, Right Arm, Left Arm, Abdomen, Front perineal area, Buttocks, Right upper leg, Left upper leg, Right lower leg (including foot), Left lower leg (including foot) Bathing: 5: Supervision: Safety issues/verbal cues  FIM - Upper Body Dressing/Undressing Upper body dressing/undressing steps patient completed: Thread/unthread right sleeve of pullover shirt/dresss, Thread/unthread left sleeve of  pullover shirt/dress, Pull shirt over trunk, Put head through opening of pull over shirt/dress Upper body dressing/undressing: 5: Set-up assist to: Obtain clothing/put away FIM - Lower Body Dressing/Undressing Lower body dressing/undressing steps patient completed: Thread/unthread right pants leg, Thread/unthread left pants leg, Pull pants up/down, Don/Doff right sock, Don/Doff left sock, Thread/unthread right underwear leg, Thread/unthread left underwear leg, Pull underwear up/down, Don/Doff right shoe, Don/Doff left shoe, Fasten/unfasten right shoe, Fasten/unfasten left shoe Lower body dressing/undressing: 5: Supervision: Safety issues/verbal cues  FIM - Toileting Toileting steps completed by patient: Adjust clothing prior to toileting, Performs perineal hygiene, Adjust clothing after toileting Toileting Assistive Devices: Grab bar or rail for support Toileting: 5: Supervision: Safety issues/verbal cues  FIM - Radio producer Devices: Product manager Transfers: 5-To toilet/BSC: Supervision (verbal cues/safety issues), 5-From toilet/BSC: Supervision (verbal cues/safety issues)  FIM - Control and instrumentation engineer Devices: Arm rests Bed/Chair Transfer: 5: Set-up assist to: Apply orthosis/W/C set-up, 5: Bed > Chair or W/C: Supervision (verbal cues/safety issues), 5: Chair or W/C > Bed: Supervision (verbal cues/safety issues)  FIM - Locomotion: Wheelchair Distance: 150 Locomotion: Wheelchair: 0: Activity did not occur FIM - Locomotion: Ambulation Locomotion: Ambulation Assistive Devices: Environmental consultant - Rolling (RW with (2) 5# cuff weights) Ambulation/Gait Assistance: 4: Min assist Locomotion: Ambulation: 2: Travels 50 - 149 ft with supervision/safety issues  Comprehension Comprehension Mode: Auditory Comprehension: 6-Follows complex conversation/direction: With extra time/assistive device  Expression Expression Mode: Verbal Expression:  6-Expresses complex ideas: With extra time/assistive device  Social Interaction Social Interaction: 6-Interacts appropriately with others with medication or extra time (anti-anxiety, antidepressant).  Problem Solving Problem Solving: 5-Solves basic 90% of the time/requires cueing < 10% of the time  Memory Memory: 5-Recognizes or recalls 90% of the time/requires cueing < 10% of the time  Medical Problem List and Plan: 1. 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 DVT Prophylaxis/Anticoagulation: SQ Lovenox .Monitor platelet and any signs of bleeding 3. Pain Management: Tylenol 4. Dysphagia. D2 nectar thick liquids. Follow-up speech therapy.  continue low rate IVF HS until liquids advanced 5. Neuropsych: This patient is capable of making decisions on her own behalf. 6. Skin/Wound Care: Routine skin checks 7. Fluids/Electrolytes/Nutrition: Strict I & O.Follow up labs 8.Hypertension. No present antihypertensive medication. Patient on Toprol 50 mg daily prior to admission as well as Tribenzor 40-5-12.5 mg daily., start amlodipine to slowly start bringing down systolic BP  -resumed toprol at 25mg  daily 9.diabetes mellitus with peripheral neuropathy.Now controlled Patient with hemoglobin A1c 8.9. Sliding scale insulin. Check blood sugars before meals and at bedtime. Patient on Glucophage 850 mg BID (home took 500mg ) -diabetic education  -some improvement with glucophage---- 10.Hyperlipidemia.lipitor 11.COPD/tobacco abuse.Counseling  12.HX Bilat. breast cancer.Follow up oncology out  patient   LOS (Days) [place] EVALUATION WAS PERFORMED  Keidan Aumiller E 10/20/2014, 6:22 AM

## 2014-10-21 ENCOUNTER — Inpatient Hospital Stay (HOSPITAL_COMMUNITY): Payer: Commercial Managed Care - HMO

## 2014-10-21 LAB — GLUCOSE, CAPILLARY
GLUCOSE-CAPILLARY: 140 mg/dL — AB (ref 70–99)
GLUCOSE-CAPILLARY: 160 mg/dL — AB (ref 70–99)
Glucose-Capillary: 142 mg/dL — ABNORMAL HIGH (ref 70–99)
Glucose-Capillary: 155 mg/dL — ABNORMAL HIGH (ref 70–99)

## 2014-10-21 NOTE — Progress Notes (Signed)
Physical Therapy Session Note  Patient Details  Name: MCKINNLEY COTTIER MRN: [id number] Date of Birth: 1950/10/28  Today's Date: 10/21/2014 PT Individual Time: 1400-1500 PT Individual Time Calculation (min): 60 min   Short Term Goals: Week 3:  PT Short Term Goal 1 (Week 3): STG=LTG  Skilled Therapeutic Interventions/Progress Updates:    Pt received supine in bed, agreeable to participate in therapy. Session focused on standing balance, gait, endurance, oculomotor exercises. Pt moved supine>sit w/ supervision, donned shoes w/ setup assist. Instructed pt in stand pivot transfer w/ RW bed>w/c w/ overall MinA. Pt propelled w/c 150' to rehab gym w/ mod (I) (FIM=5 due to flat level surface). Instructed pt in gait 24' x2 w/ Min progressing to Mod w/ fatigue assist for weight shift to L, RW management. Instructed pt in furniture transfers w/ RW bed>sofa in ADL apartment w/ overall MinA for safety and RW management. Noted pt with increased lean to R when fatigued. Instructed pt in sitting progressing to standing to standing reaching ex's for forced use of LUE/LLE w/ cues to shift weight onto LLE w/ intermittent use of 1" riser under RLE. Instructed pt in x1 viewing exercises w/ target at 4' w/ vertical and horizontal turns in sitting progressing to standing w/ 2 UE support w/ pt maintaining dizziness at 3-4/10. Session ended in pt's room, where pt was left seated in w/c w/ all needs within reach.  Therapy Documentation Precautions:  Precautions Precautions: Fall Precaution Comments: Rt lateral lean, double vision Restrictions Weight Bearing Restrictions: No Pain: Pain Assessment Pain Assessment: No/denies pain  See FIM for current functional status  Therapy/Group: Individual Therapy  Rada Hay  Rada Hay, PT, DPT 10/21/2014, 7:53 AM

## 2014-10-21 NOTE — Progress Notes (Signed)
Subjective/Complaints: Discussed weakness Still with R facial numbness C/o fatigue   Review of Systems - Negative except swallowing and balance problems Objective: Vital Signs: Blood pressure 135/75, pulse 70, temperature 98.1 F (36.7 C), temperature source Oral, resp. rate 17, weight 69 kg (152 lb 1.9 oz), SpO2 96 %. No results found. Results for orders placed or performed during the hospital encounter of 10/01/14 (from the past 72 hour(s))  Glucose, capillary     Status: None   Collection Time: 10/18/14 12:11 PM  Result Value Ref Range   Glucose-Capillary 88 70 - 99 mg/dL  Glucose, capillary     Status: Abnormal   Collection Time: 10/18/14  4:39 PM  Result Value Ref Range   Glucose-Capillary 165 (H) 70 - 99 mg/dL  Glucose, capillary     Status: Abnormal   Collection Time: 10/18/14  9:25 PM  Result Value Ref Range   Glucose-Capillary 124 (H) 70 - 99 mg/dL   Comment 1 Notify RN   Glucose, capillary     Status: Abnormal   Collection Time: 10/19/14  6:57 AM  Result Value Ref Range   Glucose-Capillary 133 (H) 70 - 99 mg/dL   Comment 1 Notify RN   Glucose, capillary     Status: Abnormal   Collection Time: 10/19/14 11:51 AM  Result Value Ref Range   Glucose-Capillary 142 (H) 70 - 99 mg/dL  Glucose, capillary     Status: Abnormal   Collection Time: 10/19/14  4:26 PM  Result Value Ref Range   Glucose-Capillary 163 (H) 70 - 99 mg/dL  Glucose, capillary     Status: Abnormal   Collection Time: 10/19/14  9:11 PM  Result Value Ref Range   Glucose-Capillary 241 (H) 70 - 99 mg/dL   Comment 1 Notify RN   Glucose, capillary     Status: Abnormal   Collection Time: 10/20/14  6:14 AM  Result Value Ref Range   Glucose-Capillary 133 (H) 70 - 99 mg/dL   Comment 1 Notify RN   Glucose, capillary     Status: Abnormal   Collection Time: 10/20/14 11:36 AM  Result Value Ref Range   Glucose-Capillary 136 (H) 70 - 99 mg/dL  Glucose, capillary     Status: Abnormal   Collection Time:  10/20/14  4:29 PM  Result Value Ref Range   Glucose-Capillary 153 (H) 70 - 99 mg/dL  Glucose, capillary     Status: Abnormal   Collection Time: 10/20/14  9:06 PM  Result Value Ref Range   Glucose-Capillary 166 (H) 70 - 99 mg/dL   Comment 1 Notify RN   Glucose, capillary     Status: Abnormal   Collection Time: 10/21/14  7:00 AM  Result Value Ref Range   Glucose-Capillary 142 (H) 70 - 99 mg/dL     HEENT: normal, no cerumen seen on out ext auditory canal ( no otoscope) Cardio: RRR and no murmur Resp: CTA B/L  GI: BS positive and NT, ND Extremity:  No Edema Skin:   Intact Neuro: Alert/Oriented, Cranial Nerve Abnormalities R V1,2,3 sensory, Abnormal Sensory reduced in LUE and LLE, Abnormal FMC Ataxic/ dec FMC , motor 4+/5 in BUE and BLE Musc/Skel:  Normal Gen NAD, voice quality hoarse   Assessment/Plan: 1. 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 Physiatrist is providing close team supervision and 24 hour management of active medical problems listed below. Physiatrist and rehab  team continue to assess barriers to discharge/monitor patient progress toward functional and medical goals.  FIM: FIM - Bathing Bathing Steps Patient Completed: Chest, Right Arm, Left Arm, Abdomen, Front perineal area, Buttocks, Right upper leg, Left upper leg, Right lower leg (including foot), Left lower leg (including foot) Bathing: 5: Supervision: Safety issues/verbal cues  FIM - Upper Body Dressing/Undressing Upper body dressing/undressing steps patient completed: Thread/unthread right sleeve of pullover shirt/dresss, Thread/unthread left sleeve of pullover shirt/dress, Put head through opening of pull over shirt/dress, Pull shirt over trunk Upper body dressing/undressing: 5: Set-up assist to: Obtain clothing/put away FIM - Lower Body  Dressing/Undressing Lower body dressing/undressing steps patient completed: Thread/unthread right pants leg, Thread/unthread left pants leg, Pull pants up/down, Thread/unthread right underwear leg, Thread/unthread left underwear leg, Pull underwear up/down, Don/Doff right shoe, Don/Doff left shoe Lower body dressing/undressing: 5: Supervision: Safety issues/verbal cues  FIM - Toileting Toileting steps completed by patient: Adjust clothing prior to toileting, Performs perineal hygiene, Adjust clothing after toileting Toileting Assistive Devices: Grab bar or rail for support Toileting: 5: Supervision: Safety issues/verbal cues  FIM - Radio producer Devices: Product manager Transfers: 5-To toilet/BSC: Supervision (verbal cues/safety issues), 5-From toilet/BSC: Supervision (verbal cues/safety issues)  FIM - Control and instrumentation engineer Devices: Arm rests Bed/Chair Transfer: 5: Sit > Supine: Supervision (verbal cues/safety issues), 5: Chair or W/C > Bed: Supervision (verbal cues/safety issues)  FIM - Locomotion: Wheelchair Distance: 300 Locomotion: Wheelchair: 5: Travels 150 ft or more: maneuvers on rugs and over door sills with supervision, cueing or coaxing FIM - Locomotion: Ambulation Locomotion: Ambulation Assistive Devices: Environmental consultant - Rolling (RW with (2) 5# cuff weights) Ambulation/Gait Assistance: 4: Min assist Locomotion: Ambulation: 0: Activity did not occur  Comprehension Comprehension Mode: Auditory Comprehension: 6-Follows complex conversation/direction: With extra time/assistive device  Expression Expression Mode: Verbal Expression: 6-Expresses complex ideas: With extra time/assistive device  Social Interaction Social Interaction: 6-Interacts appropriately with others with medication or extra time (anti-anxiety, antidepressant).  Problem Solving Problem Solving: 5-Solves basic 90% of the time/requires cueing < 10% of the  time  Memory Memory: 5-Recognizes or recalls 90% of the time/requires cueing < 10% of the time  Medical Problem List and Plan: 1. 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 DVT Prophylaxis/Anticoagulation: SQ Lovenox .Monitor platelet and any signs of bleeding 3. Pain Management: Tylenol 4. Dysphagia. D2 nectar thick liquids. Follow-up speech therapy.  continue low rate IVF HS until liquids advanced 5. Neuropsych: This patient is capable of making decisions on her own behalf. 6. Skin/Wound Care: Routine skin checks 7. Fluids/Electrolytes/Nutrition: Strict I & O.Follow up labs 8.Hypertension. No present antihypertensive medication. Patient on Toprol 50 mg daily prior to admission as well as Tribenzor 40-5-12.5 mg daily., start amlodipine to slowly start bringing down systolic BP  -resumed toprol at 25mg  daily 9.diabetes mellitus with peripheral neuropathy.Now controlled Patient with hemoglobin A1c 8.9. Sliding scale insulin. Check blood sugars before meals and at bedtime. Patient on Glucophage 850 mg BID (home took 500mg ) -diabetic education  -some improvement with glucophage---- 10.Hyperlipidemia.lipitor 11.COPD/tobacco abuse.Counseling  12.HX Bilat. breast cancer.Follow up oncology out patient   LOS (Days) [place] E 10/21/2014, 7:38 AM

## 2014-10-22 ENCOUNTER — Inpatient Hospital Stay (HOSPITAL_COMMUNITY): Payer: Commercial Managed Care - HMO

## 2014-10-22 ENCOUNTER — Inpatient Hospital Stay (HOSPITAL_COMMUNITY): Payer: Commercial Managed Care - HMO | Admitting: Occupational Therapy

## 2014-10-22 LAB — GLUCOSE, CAPILLARY: GLUCOSE-CAPILLARY: 138 mg/dL — AB (ref 70–99)

## 2014-10-22 NOTE — Progress Notes (Signed)
Occupational Therapy Discharge Summary  Patient Details  Name: Madison Todd MRN: [id number] Date of Birth: 1951/05/08   Patient has met 58 of 41 long term goals due to improved activity tolerance, improved balance, postural control, ability to compensate for deficits, functional use of  RIGHT upper and RIGHT lower extremity, improved attention, improved awareness and improved coordination.  Patient to discharge at overall Supervision level from a w/c level only.  Patient's care partner is independent to provide the necessary physical and cognitive assistance at discharge.    Reasons goals not met: n/a  Recommendation:  Patient will benefit from ongoing skilled OT services in home health setting to continue to advance functional skills in the area of BADL and iADL.  Equipment: tub bench, bedside commode  Reasons for discharge: treatment goals met  Patient/family agrees with progress made and goals achieved: Yes  OT Discharge Precautions/Restrictions  Precautions Precautions: Fall Precaution Comments: Rt lateral lean, diplopia, central vestibular impairments Restrictions Weight Bearing Restrictions: No   Vital Signs Therapy Vitals Pulse Rate: 73 BP: 110/65 mmHg Patient Position (if appropriate): Sitting (after activity) Oxygen Therapy SpO2: 100 % Pain Pain Assessment Pain Assessment: No/denies pain ADL ADL ADL Comments: very close Supervision as pt is at very high risk of falls to her R Vision/Perception  Vision- History Patient Visual Report: Blurring of vision;Diplopia Vision- Assessment Vision Assessment?: Vision impaired- to be further tested in functional context Eye Alignment: Within Functional Limits Ocular Range of Motion: Within Functional Limits Alignment/Gaze Preference: Within Defined Limits Tracking/Visual Pursuits: Able to track stimulus in all quads without difficulty Saccades: Within functional limits Convergence: Within functional  limits Visual Fields: No apparent deficits Diplopia Assessment: Present in far gaze Depth Perception: Overshoots Additional Comments: WFL Perception Comments: slight inattention to R periphery, has improved with therapy.  Cognition Overall Cognitive Status: Within Functional Limits for tasks assessed Arousal/Alertness: Awake/alert Orientation Level: Oriented X4 Attention: Sustained Sustained Attention: Impaired Sustained Attention Impairment: Functional complex Selective Attention: Appears intact Memory: Impaired Memory Impairment: Decreased recall of new information Awareness: Appears intact Problem Solving: Impaired Problem Solving Impairment: Functional complex Executive Function: Self Monitoring Self Monitoring: Impaired (delayed righting responses of postural control) Self Monitoring Impairment: Functional complex Safety/Judgment: Impaired (awareness of motor deficits, planning mobility tasks) Sensation Sensation Light Touch: Appears Intact Stereognosis: Appears Intact Hot/Cold: Appears Intact Proprioception: Appears Intact Coordination Gross Motor Movements are Fluid and Coordinated: No Fine Motor Movements are Fluid and Coordinated: No Coordination and Movement Description: Pt with impaired timing, accuracy, and excursion of trunk and LE movements. Strong R lean in static and dynamic situations Finger Nose Finger Test: diminished on R Heel Shin Test: diminished R on L Motor  Motor Motor: Hemiplegia;Abnormal postural alignment and control;Ataxia Motor - Skilled Clinical Observations: RLE weakness proximal >distal, midline disorientation, R pushing tendencies Motor - Discharge Observations: proximal + LE weakness on R; diplopia and central vestibular impairments present; close S with all mobility. Mobility  Bed Mobility Bed Mobility: Rolling Left;Left Sidelying to Sit;Sitting - Scoot to [employer] of Bed Rolling Left: 6: Modified independent (Device/Increase time) Left  Sidelying to Sit: 6: Modified independent (Device/Increase time) Sitting - Scoot to Edge of Bed: 6: Modified independent (Device/Increase time)  Trunk/Postural Assessment  Cervical Assessment Cervical Assessment: Within Functional Limits Thoracic Assessment Thoracic Assessment: Within Functional Limits Lumbar Assessment Lumbar Assessment: Within Functional Limits Postural Control Trunk Control: Pt can now hold her balance statically and dynamically with very small movements during dressing, with larger movement patterns she needs max A with trunk  control Righting Reactions: Delayed and insuffiecient Protective Responses: heavy UE reliance  Balance Balance Balance Assessed: Yes Standardized Balance Assessment Standardized Balance Assessment: Berg Balance Test Berg Balance Test Sit to Stand: Able to stand  independently using hands Standing Unsupported: Able to stand 2 minutes with supervision Sitting with Back Unsupported but Feet Supported on Floor or Stool: Able to sit safely and securely 2 minutes Stand to Sit: Uses backs of legs against chair to control descent Transfers: Able to transfer with verbal cueing and /or supervision Standing Unsupported with Eyes Closed: Able to stand 10 seconds safely Standing Ubsupported with Feet Together: Needs help to attain position and unable to hold for 15 seconds From Standing, Reach Forward with Outstretched Arm: Can reach forward >5 cm safely (2") From Standing Position, Pick up Object from Floor: Unable to try/needs assist to keep balance From Standing Position, Turn to Look Behind Over each Shoulder: Needs supervision when turning Turn 360 Degrees: Needs assistance while turning Standing Unsupported, Alternately Place Feet on Step/Stool: Needs assistance to keep from falling or unable to try Standing Unsupported, One Foot in Front: Able to take small step independently and hold 30 seconds Standing on One Leg: Unable to try or needs assist to  prevent fall Total Score: 23 Extremity/Trunk Assessment RUE Assessment RUE Assessment: Within Functional Limits LUE Assessment LUE Assessment: Within Functional Limits  See FIM for current functional status  Osmin Welz 10/22/2014, 12:06 PM

## 2014-10-22 NOTE — Discharge Summary (Signed)
Discharge summary job [phone]

## 2014-10-22 NOTE — Plan of Care (Signed)
Problem: RH Wheelchair Mobility Goal: LTG Patient will propel w/c in community environment (PT) LTG: Patient will propel wheelchair in community environment, # of feet with assist (PT)  Outcome: Completed/Met Date Met:  10/22/14 Met 10/20/14

## 2014-10-22 NOTE — Discharge Summary (Signed)
NAMEMarland Kitchen  Madison Todd, Madison Todd NO.:  [id number]  MEDICAL RECORD NO.:  [id number]  LOCATION:  4M04C                        FACILITY:  [facility]  PHYSICIAN:  Madison Todd, M.D.DATE OF BIRTH:  1951-04-25  DATE OF ADMISSION:  10/01/2014 DATE OF DISCHARGE:  10/23/2014                              DISCHARGE SUMMARY   DISCHARGE DIAGNOSES: 1. 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 2. Subcutaneous Lovenox for DVT prophylaxis. 3. Dysphagia. 4. Hypertension. 5. Diabetes mellitus with peripheral neuropathy. 6. Hyperlipidemia. 7. Chronic obstructive pulmonary disease. 8. History of bilateral breast cancer.  HISTORY OF PRESENT ILLNESS:  This is a 64 year old right-handed female with history of leukocytoclastic vasculitis, diabetes mellitus, cardiomyopathy, COPD as well as hypertension, who presented on September 27, 2014, with severe headache, blurred vision, right-sided weakness, and difficulty in swallowing.  Initial cranial CT scan negative.  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  MRA of the head with some artifactual signal loss in the mid V4 segment with suspected underlying 1 cm 75% stenosis on the right.  Echocardiogram with ejection fraction of 26%, grade 1 diastolic dysfunction.  Carotid Dopplers, no ICA stenosis.  The patient did not receive tPA.  Neurology consulted, maintained on aspirin for CVA prophylaxis as well as subcutaneous Lovenox for DVT prophylaxis.  Noted dysphagia, diet slowly advanced to a pureed honey thick liquid diet.  IV fluids for hydration. Bouts of hypokalemia with supplement added.  Hemoglobin A1c 8.9 with insulin therapy as directed.  Physical and occupational therapy ongoing. The patient was admitted for comprehensive rehab program.  PAST MEDICAL HISTORY:  See discharge  diagnoses.  SOCIAL HISTORY:  Lives with family.  FUNCTIONAL HISTORY:  Prior to admission independent.  Functional status upon admission to rehab services was moderate assist, sit to stand; minimal assist, side lying to sitting; minimal assist, ambulate 3 feet with a rolling walker; min to mod assist, activities daily living.  PHYSICAL EXAMINATION:  VITAL SIGNS:  Blood pressure 147/78, pulse 74, temperature 97, respirations 20. GENERAL:  This was an alert female, oriented to person, place, date of birth.  Followed simple commands and made good eye contact with examiner. LUNGS:  Clear to auscultation. CARDIAC:  Regular rate and rhythm. ABDOMEN:  Soft, nontender.  Good bowel sounds. NEURO:  Her speech was intelligible, slightly dysarthric, good awareness of her deficits.  REHABILITATION HOSPITAL COURSE:  Patient was admitted to inpatient rehab services with therapies initiated on a 3-hour daily basis consisting of physical therapy, occupational therapy, speech therapy, and rehabilitation nursing.  The following issues were addressed during the patient's rehabilitation stay.  Pertaining to [name]'s acute lacunar infarct involving the right lateral medullary area remained stable, maintained on aspirin therapy.  She would follow up with Neurology Services.  Subcutaneous Lovenox for DVT prophylaxis.  Her diet was advanced to a mechanical soft, followed by Speech Therapy with thin liquids.  Her blood pressures were well controlled with permissive  Hypertension in relation to CVA.  Her Lopressor had been resumed at 12.5 mg b.i.d. as well as Norvasc 10 mg daily.  She would follow up with her  primary MD, Dr. Iona Beard.  Blood sugars remained well controlled.  Hemoglobin A1c of 8.9, she continued on Glucophage and Lopid for hyperlipidemia.  She did have a history of COPD and tobacco abuse.  She received full counseling in regard to cessation of nicotine products.  The patient with  history of bilateral breast cancer.  She would follow up outpatient with Oncology Services as prior to admission.  The patient received weekly collaborative interdisciplinary team conferences to discuss estimated length of stay, family teaching, and any barriers to discharge.  Patient moved supine to sit with supervision.  She can don her shoes with set up, propel her wheelchair 150 feet to the rehab gym, ambulated 75 feet x2 minimal assistance with assistive device, a rolling walker.  Instructed the patient in furniture transfers with a rolling walker, bed to sofa, overall minimal assist for her safety, activities of daily living. Educated family inpatient on tub bench transfer techniques requiring close supervision.  Demonstrated her ability to squat pivot to and from the wheelchair to tub bench was supervision.  Full family teaching was completed and plan was discharged to home with ongoing therapies dictated per rehab services.  DISCHARGE MEDICATIONS:  Norvasc 10 mg p.o. daily, aspirin 81 mg p.o. daily, Neurontin 300 mg p.o. at bedtime, Lopid 600 mg p.o. b.i.d., Glucophage 850 mg p.o. b.i.d., Lopressor 12.5 mg p.o. b.i.d.  DIET:  Mechanical soft.  FOLLOWUP:  The patient would follow up Dr. Alysia Penna at the outpatient rehab center on November 15, 2014; Dr. Merlene Todd, Neurology Service 1 month call for appointment; Dr. Iona Beard, Medical Management.     Madison Todd, P.A.   ______________________________ Madison Todd, M.D.    DA/MEDQ  D:  10/22/2014  T:  10/22/2014  Job:  [id number]  cc:   Madison Todd, M.D. Madison Folk. Berdine Addison, MD

## 2014-10-22 NOTE — Progress Notes (Signed)
Social Work Patient ID: Madison Todd, female   DOB: 30-Sep-1950, 64 y.o.   MRN: [id number] Have ordered equipment from [place] and hopefully it will arrive before discharge tomorrow.  Her home health is arranged via [facility] and will contact upon discharge. Pt feels ready to go home tomorrow, son has been trained on her care.

## 2014-10-22 NOTE — Progress Notes (Signed)
Physical Therapy Discharge Summary  Patient Details  Name: Madison Todd MRN: [id number] Date of Birth: [birth year]  Today's Date: 10/22/2014 PT Individual Time: 0800-0900 60 min;1105   - 1205  60 min  Patient has met 13 of 13 long term goals due to improved activity tolerance, improved postural control, ability to compensate for deficits, functional use of  right upper extremity and right lower extremity, improved attention, improved awareness and improved coordination.  Patient to discharge at a wheelchair level Supervision, and limited household gait. Patient's care partner is independent to provide the necessary physical assistance at discharge.  Reasons goals not met: none  Recommendation:  Patient will benefit from ongoing skilled PT services in home health setting to continue to advance safe functional mobility, address ongoing impairments in postural control, L inattention, coordination, motor planning, memory, and to minimize fall risk.  Equipment: 16x16 W/C hemi, basic cushion; pt owns RW  Reasons for discharge: treatment goals met and discharge from hospital  Patient/family agrees with progress made and goals achieved: Yes  PT Discharge  Tx 1 (am): Pt received supine in bed. Denies fatigue, constipation, anorexia. Pt reports that her dizziness is 3/10 throughout treatment. Pt states that she feels that she is "off" today. PT educated pt on immune function s/p stroke and energy conservation. Eye patch used during transfers and mobility.  Therapeutic activity via task-breakdown for functional mobility: - Berg re-test, see below (23/57) - Re-eval of musculoskeletal status re: strength and sensation - see flowsheets for testing of coordination, etc. -Transfers sit to stand and stand to sit, overall S. Pt maintains safe midline balance as long as she initiates transfer with L hand reach. -Transfers R/L using squat pivot method with S overall  Pt propelled w/c to room and  was left seated in w/c with lunch tray, all needs within reach.  Tx 2 (PM): Pt received supine in bed, EOB, transfer to R with S to wc. Pt reports that she "feels better"; states that dizziness is 3/10 throughout session. Dizziness 4/10 with dual (cognitive) of walking while talking. No use of eye patch or shoe lift during mobility this session.  Gait training for household and community ambulation, using RW with (2) 5# cuff weights and Min A overall for occasional steadying A: -Stairs x 12 (flight) ascend/descend using B hands on L rail and step-to pattern with Min A. Mild increase in dizziness to 4/10 noted during stair descent. -Curb step with ramp, ascend/descend. 1 trial using RW, 1 trial with Min A and helper on R with arm around waist. -Gait x 50' on low pile carpet, including 2 turns to L to circle dining table for household ambulation. -Gait x 30' on tile,for Gait Velocity: 10MWT = 30 sec  - pt uses self-selected step-through pattern with RW -Gait x 30' on tile, turn to R to sit in w/c.  -- pt uses self-selected step-through pattern with RW -Pt demonstrates use of gaze stabilization technique to control dynamic midline orientation during mobility.  Therapeutic activity via task-specific training for functional mobility: -Transfers sit to stand and stand to sit, overall S. Pt maintains safe midline balance as long as she initiates transfer with L hand reach. -Transfers R/L using squat pivot method with S overall. - PT applied heel lift inside R shoe at end of session to bias weight shift to L during gait.   Pt demonstrates improved ability to self-correct midline orientation, but responses are still delayed. Pt continues to require S with occasional A during  mobility due to delayed righting reactions, strong R lean, decreased weight shift to L, deficits in swing stage re: foot clearance/slap, deficits in L stance stability. Pt also demonstrated good ability to return demonstrate w/c parts  management and propels with BUEs and BLEs, without using foot plates.  Pt will continue to benefit from skilled PT to progress functional mobility as her vision and vestibular system recover function.  Precautions/Restrictions Precautions Precautions: Fall Precaution Comments: Rt lateral lean, diplopia, central vestibular impairments Restrictions Weight Bearing Restrictions: No Vision/Perception  Vision - Assessment Eye Alignment: Within Functional Limits Ocular Range of Motion: Within Functional Limits Alignment/Gaze Preference: Within Defined Limits Tracking/Visual Pursuits: Able to track stimulus in all quads without difficulty Saccades: Within functional limits Convergence: Within functional limits Diplopia Assessment: Present in far gaze Perception Comments: slight inattention to R periphery, has improved with therapy.  Cognition Overall Cognitive Status: Within Functional Limits for tasks assessed Arousal/Alertness: Awake/alert Orientation Level: Oriented X4 Attention: Sustained Sustained Attention: Impaired Sustained Attention Impairment: Functional complex Selective Attention: Appears intact Memory: Impaired Memory Impairment: Decreased recall of new information Awareness: Appears intact Problem Solving: Impaired Problem Solving Impairment: Functional complex Executive Function: Self Monitoring Self Monitoring: Impaired (delayed righting responses of postural control) Self Monitoring Impairment: Functional complex Safety/Judgment: Impaired (awareness of motor deficits, planning mobility tasks) Mobility Bed Mobility Bed Mobility: Rolling Left;Left Sidelying to Sit;Sitting - Scoot to [employer] of Bed Rolling Left: 6: Modified independent (Device/Increase time) Left Sidelying to Sit: 6: Modified independent (Device/Increase time) Sitting - Scoot to Edge of Bed: 6: Modified independent (Device/Increase time) Transfers Transfers: Yes (squat pivot with VCs for safe hand  placement; close S overall) Locomotion  Ambulation Ambulation: Yes Ambulation/Gait Assistance: 4: Min assist Ambulation Distance (Feet): 50 Feet Assistive device: Rolling walker (RW with (2) 5# cuff weights) Ambulation/Gait Assistance Details: Verbal cues for safe use of DME/AE;Visual cues/gestures for precautions/safety (visual cues for gaze stabilization, especially during transitional movements) Ambulation/Gait Assistance Details: strong R lean during transitional movements, decreased weight shift to L, BUE reliance Gait Gait: Yes Gait Pattern: Impaired Gait Pattern: Step-through pattern;Step-to pattern;Decreased stance time - left;Decreased weight shift to left;Right genu recurvatum;Lateral trunk lean to right;Trunk flexed Gait velocity: 1.09 ft/s = risk for recurrent falls High Level Ambulation High Level Ambulation: Side stepping;Direction changes Side Stepping: with RW, Min A Direction Changes: occasional LOB when turning R Stairs / Additional Locomotion Stairs: Yes Stairs Assistance: 4: Min assist Stairs Assistance Details: Verbal cues for technique;Verbal cues for precautions/safety Stair Management Technique: One rail Left;Step to pattern;Forwards (2 hands on L rail) Number of Stairs: 12 Height of Stairs: 6 Ramp: 4: Min assist Curb: 4: Wellsite geologist: Yes Wheelchair Assistance: 5: Careers information officer: Both upper extremities;Both lower extermities Wheelchair Parts Management: Supervision/cueing (without foot rests, due to vision deficits) Distance: 300  Trunk/Postural Assessment  Cervical Assessment Cervical Assessment: Within Functional Limits Thoracic Assessment Thoracic Assessment: Within Functional Limits Lumbar Assessment Lumbar Assessment: Within Functional Limits Postural Control Trunk Control: Pt can now hold her balance statically and dynamically with very small movements, with larger movement patterns she  needs max A with trunk control; Pt continues to demonstrate decreased midline orientation in standing mobility. R lean and wide BOS present in static standing. Pt is able to self-correct after 1 cue. Righting Reactions: Delayed and insufficient Protective Responses: heavy UE reliance, Pt does demonstrate an automatic step response with LLE. Balance Balance Balance Assessed: Yes Standardized Balance Assessment Standardized Balance Assessment: [facility] Balance Test [facility] Balance Test Sit  to Stand: Able to stand  independently using hands Standing Unsupported: Able to stand 2 minutes with supervision Sitting with Back Unsupported but Feet Supported on Floor or Stool: Able to sit safely and securely 2 minutes Stand to Sit: Uses backs of legs against chair to control descent Transfers: Able to transfer with verbal cueing and /or supervision Standing Unsupported with Eyes Closed: Able to stand 10 seconds safely Standing Ubsupported with Feet Together: Needs help to attain position and unable to hold for 15 seconds From Standing, Reach Forward with Outstretched Arm: Can reach forward >5 cm safely (2") From Standing Position, Pick up Object from Floor: Unable to try/needs assist to keep balance From Standing Position, Turn to Look Behind Over each Shoulder: Needs supervision when turning Turn 360 Degrees: Needs assistance while turning Standing Unsupported, Alternately Place Feet on Step/Stool: Needs assistance to keep from falling or unable to try Standing Unsupported, One Foot in Front: Able to take small step independently and hold 30 seconds Standing on One Leg: Unable to try or needs assist to prevent fall Total Score: 23  Dynamic balance continues to be impaired. Absent ankle and hip strategies with RLE. Extremity Assessment      RLE Strength RLE Overall Strength: Deficits RLE Overall Strength Comments: 3+/5 RLE and R hip; 4/5 throughout LLE and L hip (Pt complains of muscle weakness today. Pt  attributed it to statins.) LLE Assessment LLE Assessment: Within Functional Limits (4/5 overall for LLE, assessed in sitting)  See FIM for current functional status  Blenda Mounts 10/22/2014, 5:12 PM

## 2014-10-22 NOTE — Progress Notes (Signed)
Occupational Therapy Session Note  Patient Details  Name: Madison Todd MRN: [id number] Date of Birth: 1951/05/07  Today's Date: 10/22/2014 OT Individual Time: 0900-1000 OT Individual Time Calculation (min): 60 min    Short Term Goals: Week 1:  OT Short Term Goal 1 (Week 1): Pt will complete toilet transfer with min assist OT Short Term Goal 1 - Progress (Week 1): Met OT Short Term Goal 2 (Week 1): Pt will complete bathing with min assist for standing balance OT Short Term Goal 2 - Progress (Week 1): Met OT Short Term Goal 3 (Week 1): Pt will complete UB dressing with min assist OT Short Term Goal 3 - Progress (Week 1): Met OT Short Term Goal 4 (Week 1): Pt will complete 2 grooming tasks in standing with min assist for standing balance OT Short Term Goal 4 - Progress (Week 1): Met Week 2:  OT Short Term Goal 1 (Week 2): Pt will complete bathing with Supervision. OT Short Term Goal 1 - Progress (Week 2): Progressing toward goal OT Short Term Goal 2 (Week 2): Pt will complete LB dressing with Supervision. OT Short Term Goal 2 - Progress (Week 2): Progressing toward goal OT Short Term Goal 3 (Week 2): Pt will complete squat pivot transfer to toilet with steadying A. OT Short Term Goal 3 - Progress (Week 2): Met OT Short Term Goal 4 (Week 2): Pt will be able to sit to stand with steadying A without leaning to R to prepare for LB dressing. OT Short Term Goal 4 - Progress (Week 2): Met OT Short Term Goal 5 (Week 2): Pt will complete simulated tub bench transfer in tub (with clothes on) with steadying A w/c to tub bench. OT Short Term Goal 5 - Progress (Week 2): Met Week 3:  OT Short Term Goal 1 (Week 3): Pt will bathe with supervision demonstrating improved sitting balance on tub bench. OT Short Term Goal 1 - Progress (Week 3): Met OT Short Term Goal 2 (Week 3): Pt will dress LB with supervision demonstrating improved trunk control to prevent leaning to the R. OT Short Term Goal 2 -  Progress (Week 3): Met OT Short Term Goal 3 (Week 3): Pt will complete a stand pivot transfer with a RW with min A. OT Short Term Goal 3 - Progress (Week 3): Not met  Skilled Therapeutic Interventions/Progress Updates:      Pt seen for BADL retraining of toileting, bathing, and dressing with a focus on pt completing all of her transfers safely using squat pivot transfers and safe standing balance with self care. Pt was able to complete all of her self care and transfers safely today. She used excellent technique to pivot with control. During dressing, she took her time to stand slowly, ensure her balance was secure before adjusting clothing over her hips. Pt did meet her supervision goals from a w/c level. Reviewed home safety and need to always have someone with her. Pt is concerned about her cholesterol medication and how to avoid taking it. Advised consultation with her physician, but also discussed and reviewed healthy eating strategies and simple recipes she can use at home to integrate vegetables into her diet with soft consistencies as she is on a Dys III diet.  Pt resting in room with all needs met.  Therapy Documentation Precautions:  Precautions Precautions: Fall Precaution Comments: Rt lateral lean, diplopia, central vestibular impairments Restrictions Weight Bearing Restrictions: No Vital Signs: Therapy Vitals Pulse Rate: 73 BP: 110/65 mmHg Patient  Position (if appropriate): Sitting (after activity) Oxygen Therapy SpO2: 100 % Pain: Pain Assessment Pain Assessment: No/denies pain Pain Score: 0-No pain ADL:   See FIM for current functional status  Therapy/Group: Individual Therapy  [place] 10/22/2014, 11:48 AM

## 2014-10-22 NOTE — Discharge Instructions (Signed)
Inpatient Rehab Discharge Instructions  [place] Discharge date and time: No discharge date for patient encounter.   Activities/Precautions/ Functional Status: Activity: activity as tolerated Diet: Soft Wound Care: none needed Functional status:  ___ No restrictions     ___ Walk up steps independently ___ 24/7 supervision/assistance   ___ Walk up steps with assistance ___ Intermittent supervision/assistance  ___ Bathe/dress independently ___ Walk with walker     ___ Bathe/dress with assistance ___ Walk Independently    ___ Shower independently _x__ Walk with assistance    ___ Shower with assistance ___ No alcohol     ___ Return to work/school ________  Special Instructions:    COMMUNITY REFERRALS UPON DISCHARGE:    Home Health:   PT, OT, [place] PTWSF:[phone] Date of last service:10/23/2014  Medical Equipment/Items Ordered:WHEELCHAIR & BEDSIDE COMMODE  Agency/Supplier:[facility]   [phone] OR [phone] [facility]-PRIVATE PAY   GENERAL COMMUNITY RESOURCES FOR PATIENT/FAMILY: Support Groups:[facility] SUPPORT GROUP   My questions have been answered and I understand these instructions. I will adhere to these goals and the provided educational materials after my discharge from the hospital.  Patient/Caregiver Signature _______________________________ Date __________  Clinician Signature _______________________________________ Date __________  Please bring this form and your medication list with you to all your follow-up doctor's appointments.  STROKE/TIA DISCHARGE INSTRUCTIONS SMOKING Cigarette smoking nearly doubles your risk of having a stroke & is the single most alterable risk factor  If you smoke or have smoked in the last 12 months, you are advised to quit smoking for your health.  Most of the excess cardiovascular risk related to smoking disappears within a year of stopping.  Ask you doctor about  anti-smoking medications  [place] Quit Line: 1-800-QUIT NOW  Free Smoking Cessation Classes [phone]  CHOLESTEROL Know your levels; limit fat & cholesterol in your diet  Lipid Panel     Component Value Date/Time   CHOL 184 09/28/2014 0547   TRIG 62 09/28/2014 0547   HDL 36* 09/28/2014 0547   CHOLHDL 5.1 09/28/2014 0547   VLDL 12 09/28/2014 0547   LDLCALC 136* 09/28/2014 0547      Many patients benefit from treatment even if their cholesterol is at goal.  Goal: Total Cholesterol (CHOL) less than 160  Goal:  Triglycerides (TRIG) less than 150  Goal:  HDL greater than 40  Goal:  LDL (LDLCALC) less than 100   BLOOD PRESSURE American Stroke Association blood pressure target is less that 120/80 mm/Hg  Your discharge blood pressure is:  BP: 136/65 mmHg  Monitor your blood pressure  Limit your salt and alcohol intake  Many individuals will require more than one medication for high blood pressure  DIABETES (A1c is a blood sugar average for last 3 months) Goal HGBA1c is under 7% (HBGA1c is blood sugar average for last 3 months)  Diabetes:     Lab Results  Component Value Date   HGBA1C 8.9* 09/28/2014     Your HGBA1c can be lowered with medications, healthy diet, and exercise.  Check your blood sugar as directed by your physician  Call your physician if you experience unexplained or low blood sugars.  PHYSICAL ACTIVITY/REHABILITATION Goal is 30 minutes at least 4 days per week  Activity: Increase activity slowly, Therapies: Physical Therapy: Home Health Return to work:   Activity decreases your risk of heart attack and stroke and makes your heart stronger.  It helps control your weight and blood pressure; helps you  relax and can improve your mood.  Participate in a regular exercise program.  Talk with your doctor about the best form of exercise for you (dancing, walking, swimming, cycling).  DIET/WEIGHT Goal is to maintain a healthy weight  Your discharge diet is: DIET  DYS 3  liquids Your height is:    Your current weight is: Weight: 69 kg (152 lb 1.9 oz) Your Body Mass Index (BMI) is:     Following the type of diet specifically designed for you will help prevent another stroke.  Your goal weight range is:    Your goal Body Mass Index (BMI) is 19-24.  Healthy food habits can help reduce 3 risk factors for stroke:  High cholesterol, hypertension, and excess weight.  RESOURCES Stroke/Support Group:  Call [phone]   STROKE EDUCATION PROVIDED/REVIEWED AND GIVEN TO PATIENT Stroke warning signs and symptoms How to activate emergency medical system (call 911). Medications prescribed at discharge. Need for follow-up after discharge. Personal risk factors for stroke. Pneumonia vaccine given:  Flu vaccine given:  My questions have been answered, the writing is legible, and I understand these instructions.  I will adhere to these goals & educational materials that have been provided to me after my discharge from the hospital.

## 2014-10-22 NOTE — Progress Notes (Addendum)
Subjective/Complaints: Some achiness, muscle cramps , wondering about gemfirozil, is intolerant to statins but discussed that this med is not a statin   Review of Systems - Negative except swallowing and balance problems Objective: Vital Signs: Blood pressure 117/69, pulse 71, temperature 98 F (36.7 C), temperature source Oral, resp. rate 18, weight 69 kg (152 lb 1.9 oz), SpO2 98 %. No results found. Results for orders placed or performed during the hospital encounter of 10/01/14 (from the past 72 hour(s))  Glucose, capillary     Status: Abnormal   Collection Time: 10/19/14 11:51 AM  Result Value Ref Range   Glucose-Capillary 142 (H) 70 - 99 mg/dL  Glucose, capillary     Status: Abnormal   Collection Time: 10/19/14  4:26 PM  Result Value Ref Range   Glucose-Capillary 163 (H) 70 - 99 mg/dL  Glucose, capillary     Status: Abnormal   Collection Time: 10/19/14  9:11 PM  Result Value Ref Range   Glucose-Capillary 241 (H) 70 - 99 mg/dL   Comment 1 Notify RN   Glucose, capillary     Status: Abnormal   Collection Time: 10/20/14  6:14 AM  Result Value Ref Range   Glucose-Capillary 133 (H) 70 - 99 mg/dL   Comment 1 Notify RN   Glucose, capillary     Status: Abnormal   Collection Time: 10/20/14 11:36 AM  Result Value Ref Range   Glucose-Capillary 136 (H) 70 - 99 mg/dL  Glucose, capillary     Status: Abnormal   Collection Time: 10/20/14  4:29 PM  Result Value Ref Range   Glucose-Capillary 153 (H) 70 - 99 mg/dL  Glucose, capillary     Status: Abnormal   Collection Time: 10/20/14  9:06 PM  Result Value Ref Range   Glucose-Capillary 166 (H) 70 - 99 mg/dL   Comment 1 Notify RN   Glucose, capillary     Status: Abnormal   Collection Time: 10/21/14  7:00 AM  Result Value Ref Range   Glucose-Capillary 142 (H) 70 - 99 mg/dL  Glucose, capillary     Status: Abnormal   Collection Time: 10/21/14 11:29 AM  Result Value Ref Range   Glucose-Capillary 140 (H) 70 - 99 mg/dL  Glucose,  capillary     Status: Abnormal   Collection Time: 10/21/14  4:32 PM  Result Value Ref Range   Glucose-Capillary 160 (H) 70 - 99 mg/dL  Glucose, capillary     Status: Abnormal   Collection Time: 10/21/14  9:32 PM  Result Value Ref Range   Glucose-Capillary 155 (H) 70 - 99 mg/dL  Glucose, capillary     Status: Abnormal   Collection Time: 10/22/14  6:37 AM  Result Value Ref Range   Glucose-Capillary 138 (H) 70 - 99 mg/dL     HEENT: normal, no cerumen seen on out ext auditory canal ( no otoscope) Cardio: RRR and no murmur Resp: CTA B/L  GI: BS positive and NT, ND Extremity:  No Edema Skin:   Intact Neuro: Alert/Oriented, Cranial Nerve Abnormalities R V1,2,3 sensory, Abnormal Sensory reduced in LUE and LLE, Abnormal FMC Ataxic/ dec FMC , motor 4+/5 in BUE and BLE Musc/Skel:  Normal Gen NAD, voice quality hoarse   Assessment/Plan: 1. 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 Physiatrist is providing close team supervision and 24 hour management of active medical problems listed below. Physiatrist and rehab team  continue to assess barriers to discharge/monitor patient progress toward functional and medical goals.  FIM: FIM - Bathing Bathing Steps Patient Completed: Chest, Right Arm, Left Arm, Abdomen, Front perineal area, Buttocks, Right upper leg, Left upper leg, Right lower leg (including foot), Left lower leg (including foot) Bathing: 5: Supervision: Safety issues/verbal cues  FIM - Upper Body Dressing/Undressing Upper body dressing/undressing steps patient completed: Thread/unthread right sleeve of pullover shirt/dresss, Thread/unthread left sleeve of pullover shirt/dress, Put head through opening of pull over shirt/dress, Pull shirt over trunk Upper body dressing/undressing: 5: Set-up assist to: Obtain clothing/put away FIM - Lower  Body Dressing/Undressing Lower body dressing/undressing steps patient completed: Thread/unthread right pants leg, Thread/unthread left pants leg, Pull pants up/down, Thread/unthread right underwear leg, Thread/unthread left underwear leg, Pull underwear up/down, Don/Doff left sock, Don/Doff right sock Lower body dressing/undressing: 5: Supervision: Safety issues/verbal cues  FIM - Toileting Toileting steps completed by patient: Adjust clothing prior to toileting, Performs perineal hygiene, Adjust clothing after toileting Toileting Assistive Devices: Grab bar or rail for support Toileting: 5: Supervision: Safety issues/verbal cues  FIM - Radio producer Devices: Product manager Transfers: 5-To toilet/BSC: Supervision (verbal cues/safety issues), 5-From toilet/BSC: Supervision (verbal cues/safety issues)  FIM - Control and instrumentation engineer Devices: Arm rests Bed/Chair Transfer: 5: Sit > Supine: Supervision (verbal cues/safety issues), 4: Bed > Chair or W/C: Min A (steadying Pt. > 75%), 4: Chair or W/C > Bed: Min A (steadying Pt. > 75%)  FIM - Locomotion: Wheelchair Distance: 300 Locomotion: Wheelchair: 5: Travels 150 ft or more: maneuvers on rugs and over door sills with supervision, cueing or coaxing FIM - Locomotion: Ambulation Locomotion: Ambulation Assistive Devices: Administrator Ambulation/Gait Assistance: 4: Min assist, 3: Mod assist Locomotion: Ambulation: 2: Travels 50 - 149 ft with moderate assistance (Pt: 50 - 74%)  Comprehension Comprehension Mode: Auditory Comprehension: 6-Follows complex conversation/direction: With extra time/assistive device  Expression Expression Mode: Verbal Expression: 6-Expresses complex ideas: With extra time/assistive device  Social Interaction Social Interaction: 6-Interacts appropriately with others with medication or extra time (anti-anxiety, antidepressant).  Problem Solving Problem  Solving: 5-Solves complex 90% of the time/cues < 10% of the time  Memory Memory: 5-Recognizes or recalls 90% of the time/requires cueing < 10% of the time  Medical Problem List and Plan: 1. 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 DVT Prophylaxis/Anticoagulation: SQ Lovenox .Monitor platelet and any signs of bleeding 3. Pain Management: Tylenol 4. Dysphagia. D2, off IVF5. Neuropsych: This patient is capable of making decisions on her own behalf. 6. Skin/Wound Care: Routine skin checks 7. Fluids/Electrolytes/Nutrition: Strict I & O.Follow up labs 8.Hypertension. No present antihypertensive medication. Patient on Toprol 50 mg daily prior to admission as well as Tribenzor 40-5-12.5 mg daily., start amlodipine to slowly start bringing down systolic BP  -resumed toprol at 25mg  daily 9.diabetes mellitus with peripheral neuropathy.Now controlled Patient with hemoglobin A1c 8.9. Sliding scale insulin. Check blood sugars before meals and at bedtime. Patient on Glucophage 850 mg BID (home took 500mg ) -diabetic education  -some improvement with glucophage---- 10.Hyperlipidemia.off lipitor, pt now concerned about gemfibrozil causing muscle cramps , will hold and observe 11.COPD/tobacco abuse.Counseling  12.HX Bilat. breast cancer.Follow up oncology out patient   LOS (Days) [place] E 10/22/2014, 7:08 AM

## 2014-10-23 LAB — GLUCOSE, CAPILLARY
GLUCOSE-CAPILLARY: 107 mg/dL — AB (ref 70–99)
Glucose-Capillary: 125 mg/dL — ABNORMAL HIGH (ref 70–99)

## 2014-10-23 MED ORDER — METOPROLOL TARTRATE 25 MG PO TABS: 12.5000 mg | ORAL_TABLET | Freq: Two times a day (BID) | ORAL | Status: AC

## 2014-10-23 MED ORDER — AMLODIPINE BESYLATE 10 MG PO TABS: 10.0000 mg | ORAL_TABLET | Freq: Every day | ORAL | Status: AC

## 2014-10-23 MED ORDER — METFORMIN HCL 850 MG PO TABS: 850.0000 mg | ORAL_TABLET | Freq: Two times a day (BID) | ORAL | Status: DC

## 2014-10-23 MED ORDER — GABAPENTIN 300 MG PO CAPS: 300.0000 mg | ORAL_CAPSULE | Freq: Every day | ORAL | Status: DC

## 2014-10-23 NOTE — Progress Notes (Addendum)
Subjective/Complaints: Feels good no muscle aches   Review of Systems - Negative except swallowing and balance problems Objective: Vital Signs: Blood pressure 117/81, pulse 65, temperature 97.7 F (36.5 C), temperature source Oral, resp. rate 17, weight 69 kg (152 lb 1.9 oz), SpO2 98 %. No results found. Results for orders placed or performed during the hospital encounter of 10/01/14 (from the past 72 hour(s))  Glucose, capillary     Status: Abnormal   Collection Time: 10/20/14 11:36 AM  Result Value Ref Range   Glucose-Capillary 136 (H) 70 - 99 mg/dL  Glucose, capillary     Status: Abnormal   Collection Time: 10/20/14  4:29 PM  Result Value Ref Range   Glucose-Capillary 153 (H) 70 - 99 mg/dL  Glucose, capillary     Status: Abnormal   Collection Time: 10/20/14  9:06 PM  Result Value Ref Range   Glucose-Capillary 166 (H) 70 - 99 mg/dL   Comment 1 Notify RN   Glucose, capillary     Status: Abnormal   Collection Time: 10/21/14  7:00 AM  Result Value Ref Range   Glucose-Capillary 142 (H) 70 - 99 mg/dL  Glucose, capillary     Status: Abnormal   Collection Time: 10/21/14 11:29 AM  Result Value Ref Range   Glucose-Capillary 140 (H) 70 - 99 mg/dL  Glucose, capillary     Status: Abnormal   Collection Time: 10/21/14  4:32 PM  Result Value Ref Range   Glucose-Capillary 160 (H) 70 - 99 mg/dL  Glucose, capillary     Status: Abnormal   Collection Time: 10/21/14  9:32 PM  Result Value Ref Range   Glucose-Capillary 155 (H) 70 - 99 mg/dL  Glucose, capillary     Status: Abnormal   Collection Time: 10/22/14  6:37 AM  Result Value Ref Range   Glucose-Capillary 138 (H) 70 - 99 mg/dL     HEENT: normal, no cerumen seen on out ext auditory canal ( no otoscope) Cardio: RRR and no murmur Resp: CTA B/L  GI: BS positive and NT, ND Extremity:  No Edema Skin:   Intact Neuro: Alert/Oriented, Cranial Nerve Abnormalities R V1,2,3 sensory, Abnormal Sensory reduced in LUE and LLE, Abnormal FMC  Ataxic/ dec FMC , motor 4+/5 in BUE and BLE Musc/Skel:  Normal Gen NAD, voice quality hoarse   Assessment/Plan: 1. 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  Stable for D/C today F/u PCP in 1-2 weeks F/u PM&R 3 weeks See D/C summary See D/C instructions  FIM: FIM - Bathing Bathing Steps Patient Completed: Chest, Right Arm, Left Arm, Abdomen, Front perineal area, Buttocks, Right upper leg, Left upper leg, Right lower leg (including foot), Left lower leg (including foot) Bathing: 5: Supervision: Safety issues/verbal cues  FIM - Upper Body Dressing/Undressing Upper body dressing/undressing steps patient completed: Thread/unthread right sleeve of pullover shirt/dresss, Thread/unthread left sleeve of pullover shirt/dress, Put head through opening of pull over shirt/dress, Pull shirt over trunk Upper body dressing/undressing: 6: More than reasonable amount of time FIM - Lower Body Dressing/Undressing Lower body dressing/undressing steps patient completed: Thread/unthread right pants leg, Thread/unthread left pants leg, Pull pants up/down, Thread/unthread right underwear leg, Thread/unthread left underwear leg, Pull underwear up/down, Don/Doff left sock, Don/Doff right sock, Don/Doff right shoe, Don/Doff left shoe, Fasten/unfasten right shoe, Fasten/unfasten left shoe Lower body dressing/undressing: 5: Supervision: Safety issues/verbal cues  FIM - Toileting Toileting steps completed  by patient: Adjust clothing prior to toileting, Performs perineal hygiene, Adjust clothing after toileting Toileting Assistive Devices: Grab bar or rail for support Toileting: 5: Supervision: Safety issues/verbal cues  FIM - Radio producer Devices: Bedside commode (BSC over toilet) Toilet Transfers: 5-To toilet/BSC: Supervision  (verbal cues/safety issues), 5-From toilet/BSC: Supervision (verbal cues/safety issues)  FIM - Control and instrumentation engineer Devices: Arm rests Bed/Chair Transfer: 5: Supine > Sit: Supervision (verbal cues/safety issues), 5: Bed > Chair or W/C: Supervision (verbal cues/safety issues), 5: Chair or W/C > Bed: Supervision (verbal cues/safety issues)  FIM - Locomotion: Wheelchair Distance: 300 Locomotion: Wheelchair: 5: Travels 150 ft or more: maneuvers on rugs and over door sills with supervision, cueing or coaxing FIM - Locomotion: Ambulation Locomotion: Ambulation Assistive Devices: Cobb (RW with (2) 5# cuff weights) Ambulation/Gait Assistance: 4: Min assist Locomotion: Ambulation: 2: Travels 50 - 149 ft with minimal assistance (Pt.>75%)  Comprehension Comprehension Mode: Auditory Comprehension: 6-Follows complex conversation/direction: With extra time/assistive device  Expression Expression Mode: Verbal Expression: 6-Expresses complex ideas: With extra time/assistive device  Social Interaction Social Interaction: 6-Interacts appropriately with others with medication or extra time (anti-anxiety, antidepressant).  Problem Solving Problem Solving: 5-Solves basic 90% of the time/requires cueing < 10% of the time  Memory Memory: 5-Recognizes or recalls 90% of the time/requires cueing < 10% of the time  Medical Problem List and Plan: 1. 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 DVT Prophylaxis/Anticoagulation: SQ Lovenox  To be d/ced 3. Pain Management: Tylenol 4. Dysphagia. D2, off IVF5. Neuropsych: This patient is capable of making decisions on her own behalf. 6. Skin/Wound Care: Routine skin checks 7. Fluids/Electrolytes/Nutrition: Strict I & O.Follow up labs 8.Hypertension. No present antihypertensive medication. Patient on Toprol 50 mg daily prior to admission as well as Tribenzor 40-5-12.5 mg  daily., start amlodipine to slowly start bringing down systolic BP  -resumed toprol at 25mg  daily 9.diabetes mellitus with peripheral neuropathy.Now controlled Patient with hemoglobin A1c 8.9. Sliding scale insulin. Check blood sugars before meals and at bedtime. Patient on Glucophage 850 mg BID (home took 500mg ) -diabetic education  -some improvement with glucophage---- 10.Hyperlipidemia.off lipitor, pt now concerned about gemfibrozil causing muscle cramps , will hold and observe 11.COPD/tobacco abuse.Counseling  12.HX Bilat. breast cancer.Follow up oncology out patient   LOS (Days) [place] E 10/23/2014, 6:46 AM

## 2014-10-23 NOTE — Progress Notes (Signed)
Social Work Discharge Note Discharge Note  The overall goal for the admission was met for:   Discharge location: [place]  Length of Stay: Yes-22 DAYS  Discharge activity level: Yes-SUPERVISION/MIN LEVEL  Home/community participation: Yes  Services provided included: MD, RD, PT, OT, SLP, RN, CM, TR, Pharmacy, Neuropsych and SW  Financial Services: Private Insurance: [place]  Follow-up services arranged: [facility] Health: [facility],OT, RN, DME: [facility]-WHEELCHAIR & BEDSIDE COMMODE  [facility]-PRIVATE PAY and Patient/Family has no preference for HH/DME agencies  Comments (or additional information):FAMILY EDUCATION COMPLETED [place].  SON AWARE PT REQUIRES 24 HR CARE  Patient/Family verbalized understanding of follow-up arrangements: Yes  Individual responsible for coordination of the follow-up plan: SELF & NATE-SON  Confirmed correct DME delivered: Elease Hashimoto 10/23/2014    Elease Hashimoto

## 2014-10-23 NOTE — Progress Notes (Signed)
Social Work Patient ID: Madison Todd, female   DOB: 03-02-1951, 64 y.o.   MRN: [id number] Met with pt to inform the wheelchair cushion is not covered by her insurance and the cost.  She feels it is too expensive and will come up with something on her own. She is ready for discharge today and feels prepared for home.  Will touch base with son when here to see if any last minute questions.

## 2014-10-24 LAB — GLUCOSE, CAPILLARY
Glucose-Capillary: 154 mg/dL — ABNORMAL HIGH (ref 70–99)
Glucose-Capillary: 138 mg/dL — ABNORMAL HIGH (ref 70–99)

## 2014-11-12 DIAGNOSIS — R1310 Dysphagia, unspecified: Secondary | ICD-10-CM | POA: Diagnosis not present

## 2014-11-12 DIAGNOSIS — E1140 Type 2 diabetes mellitus with diabetic neuropathy, unspecified: Secondary | ICD-10-CM | POA: Diagnosis not present

## 2014-11-12 DIAGNOSIS — I69391 Dysphagia following cerebral infarction: Secondary | ICD-10-CM | POA: Diagnosis not present

## 2014-11-12 DIAGNOSIS — Z853 Personal history of malignant neoplasm of breast: Secondary | ICD-10-CM

## 2014-11-12 DIAGNOSIS — I10 Essential (primary) hypertension: Secondary | ICD-10-CM

## 2014-11-12 DIAGNOSIS — J449 Chronic obstructive pulmonary disease, unspecified: Secondary | ICD-10-CM | POA: Diagnosis not present

## 2014-11-12 DIAGNOSIS — I429 Cardiomyopathy, unspecified: Secondary | ICD-10-CM

## 2014-11-15 ENCOUNTER — Ambulatory Visit (HOSPITAL_BASED_OUTPATIENT_CLINIC_OR_DEPARTMENT_OTHER): Payer: Commercial Managed Care - HMO | Admitting: Physical Medicine & Rehabilitation

## 2014-11-15 ENCOUNTER — Encounter: Payer: Self-pay | Admitting: Physical Medicine & Rehabilitation

## 2014-11-15 ENCOUNTER — Encounter: Payer: Self-pay | Admitting: Internal Medicine

## 2014-11-15 ENCOUNTER — Encounter: Payer: Commercial Managed Care - HMO | Attending: Physical Medicine & Rehabilitation

## 2014-11-15 VITALS — BP 126/75 | HR 76 | Resp 14

## 2014-11-15 DIAGNOSIS — G464 Cerebellar stroke syndrome: Secondary | ICD-10-CM | POA: Diagnosis not present

## 2014-11-15 DIAGNOSIS — I69391 Dysphagia following cerebral infarction: Secondary | ICD-10-CM | POA: Diagnosis not present

## 2014-11-15 DIAGNOSIS — H8141 Vertigo of central origin, right ear: Secondary | ICD-10-CM | POA: Diagnosis not present

## 2014-11-15 DIAGNOSIS — R270 Ataxia, unspecified: Secondary | ICD-10-CM | POA: Diagnosis not present

## 2014-11-15 DIAGNOSIS — G463 Brain stem stroke syndrome: Secondary | ICD-10-CM

## 2014-11-15 DIAGNOSIS — IMO0001 Reserved for inherently not codable concepts without codable children: Secondary | ICD-10-CM

## 2014-11-15 DIAGNOSIS — H814 Vertigo of central origin: Secondary | ICD-10-CM | POA: Insufficient documentation

## 2014-11-15 NOTE — Progress Notes (Signed)
Subjective:    Patient ID: Madison Todd, female    DOB: 11-Apr-1951, 63 y.o.   MRN: [id number] This is a 64 year old right-handed female with history of leukocytoclastic vasculitis, diabetes mellitus, cardiomyopathy, COPD as well as hypertension, who presented on September 27, 2014, with severe headache, blurred vision, right-sided weakness, and difficulty in swallowing.  Initial cranial CT scan negative.  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  MRA of the head with some artifactual signal loss in the mid V4 segment with suspected underlying 1 cm 75% stenosis on the right.  Echocardiogram with ejection fraction of 94%, grade 1 diastolic dysfunction.  Carotid Dopplers, no ICA stenosis  DATE OF ADMISSION:  10/01/2014 DATE OF DISCHARGE:  10/23/2014  HPI Patient has had home health PT OT and speech. Speech has worked on swallowing mainly. Still has some cold sensation on the right side of her face and her right hand. One fall at home when trying to get into bed  Patient is using a walker to ambulate She is independent with her dressing Home health aide assists her with bathing mainly washing her back., Uses shower chair Home health is encouraging ambulation as well as home exercise program.  Pain Inventory Average Pain 0 Pain Right Now 0 My pain is no pain  In the last 24 hours, has pain interfered with the following? General activity 0 Relation with others 0 Enjoyment of life 0 What TIME of day is your pain at its worst? no pain Sleep (in general) NA  Pain is worse with: no pain Pain improves with: no pain Relief from Meds: 0  Mobility walk without assistance how many minutes can you walk? 8 ability to climb steps?  yes do you drive?  no  Function Do you have any goals in this area?  yes  Neuro/Psych weakness numbness tingling trouble  walking dizziness  Prior Studies Any changes since last visit?  no  Physicians involved in your care Any changes since last visit?  no   Family History  Problem Relation Age of Onset  . Diabetes Father   . Cancer Maternal Aunt   . Cancer Paternal Aunt   . Colon cancer Neg Hx    History   Social History  . Marital Status: Legally Separated    Spouse Name: N/A  . Number of Children: 3  . Years of Education: N/A   Occupational History  .     Social History Main Topics  . Smoking status: Current Every Day Smoker -- 0.50 packs/day for 18 years    Types: Cigarettes  . Smokeless tobacco: Never Used     Comment: smokes 12 cigarettes daily  . Alcohol Use: No  . Drug Use: No  . Sexual Activity: No   Other Topics Concern  . None   Social History Narrative   Past Surgical History  Procedure Laterality Date  . Mastectomy, radical      right  . Breast lumpectomy      left  . Port-a-cath removal    . Colonoscopy  07/20/2005    RMR: Diminutive ascending colon polyp, status post resection . Inflamed focally adenomatous polyp  . Colonoscopy N/A 10/05/2013    RMR: Multiple rectal and colonic polyps-removed/treated. 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 other polyps tubular adenomas  . Flexible sigmoidoscopy N/A 10/11/2013    RMR: rectal polypectomy site identified, assitional resection and tatooing  performed. 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 Flexible sigmoidoscopy N/A 11/24/2013    Procedure: FLEXIBLE SIGMOIDOSCOPY;  Surgeon: Daneil Dolin, MD;  Location: [facility];  Service: Endoscopy;  Laterality: N/A;  1:00-moved to [place] notified pt   Past Medical History  Diagnosis Date  . Leukocytoclastic vasculitis   . Diabetes mellitus   . Breast cancer     left 2007/right 1997  . Left-sided Breast cancer 07/22/2011  . Cardiomyopathy 07/22/2011  . COPD (chronic obstructive pulmonary disease) 07/22/2011  . Hypertension   .  Hyperlipidemia   . 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  Stage II (T2 N0).  ER 73%, PR 90%, Her2 positive, Ki-67 19%.  2.1 cm in size.  Agreed to only have radiation, Herceptin, and Aromasin.  Right sided breast cancer: Stage II (T2 N0 M0).  2.5 cm in size.  Surgery on 04/28/1996 with axillary dissection on 05/10/1996.  11 negative nodes.  Treated with AC x 4 cycles for ER negative, PR positive at only 20%.    . Breast cancer, right breast 12/16/2013   BP 126/75 mmHg  Pulse 76  Resp 14  SpO2 100%  Opioid Risk Score:   Fall Risk Score: Moderate Fall Risk (6-13 points) (educated and given handout on fall prevention in the home)  Review of Systems  Respiratory: Positive for shortness of breath.   Gastrointestinal: Positive for nausea.  Endocrine:       High blood sugar  Musculoskeletal: Positive for gait problem.  Neurological: Positive for dizziness, weakness and numbness.       Tingling  All other systems reviewed and are negative.      Objective:   Physical Exam  Motor strength is 5/5 bilateral deltoids, biceps, triceps, grip, hip flexor, knee extensor, ankle dorsiflexor and plantar flexor Cerebellar testing shows dysmetria finger-nose-finger on the right side, normal on the left side Sensation hypoesthesia on the left side in the upper extremity Decreased sensation right hemifacial Right lateral pulsion with standing feet together, maintains balance with feet apart Voice without hoarseness      Assessment & Plan:  1. Right Wallenberg syndrome with persistent Right lateral lean, right hemi-facial dysesthesias, dysphasia, right upper extremity ataxia Recommend continue home health PT OT speech Follow-up in 1 month, may convert to outpatient therapy at that time No driving No return to work at current time Continue walker for ambulation with standby assistance

## 2014-11-15 NOTE — Patient Instructions (Signed)
If you still need therapy when I see you next month, I will refer you to outpatient therapy.

## 2014-12-17 ENCOUNTER — Ambulatory Visit (HOSPITAL_COMMUNITY): Payer: Medicare HMO | Admitting: Hematology & Oncology

## 2014-12-21 ENCOUNTER — Ambulatory Visit (HOSPITAL_BASED_OUTPATIENT_CLINIC_OR_DEPARTMENT_OTHER): Payer: Commercial Managed Care - HMO | Admitting: Physical Medicine & Rehabilitation

## 2014-12-21 ENCOUNTER — Encounter: Payer: Self-pay | Admitting: Physical Medicine & Rehabilitation

## 2014-12-21 ENCOUNTER — Encounter: Payer: Commercial Managed Care - HMO | Attending: Physical Medicine & Rehabilitation

## 2014-12-21 VITALS — BP 140/76 | HR 70 | Resp 14

## 2014-12-21 DIAGNOSIS — R270 Ataxia, unspecified: Secondary | ICD-10-CM | POA: Insufficient documentation

## 2014-12-21 DIAGNOSIS — G464 Cerebellar stroke syndrome: Secondary | ICD-10-CM | POA: Diagnosis not present

## 2014-12-21 DIAGNOSIS — G463 Brain stem stroke syndrome: Principal | ICD-10-CM

## 2014-12-21 DIAGNOSIS — H8141 Vertigo of central origin, right ear: Secondary | ICD-10-CM | POA: Diagnosis not present

## 2014-12-21 DIAGNOSIS — I69391 Dysphagia following cerebral infarction: Secondary | ICD-10-CM | POA: Insufficient documentation

## 2014-12-21 NOTE — Patient Instructions (Signed)
No Driving Will discuss at next visit

## 2014-12-21 NOTE — Progress Notes (Signed)
Subjective:    Patient ID: Madison Todd, female    DOB: 06-08-1951, 64 y.o.   MRN: [id number] September 27, 2014, with severe headache, blurred vision, right-sided weakness, and difficulty in swallowing.  Initial cranial CT scan negative.  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 Patient has not started outpatient therapy. Patient does have transportation. Using a walker for ambulation. She is independent with dressing but still needs some assistance for bathing. No falls at home Eyesight seems to be doing better. Tingling and cold feeling in both hands Pain Inventory Average Pain 4 Pain Right Now 0 My pain is NO PAIN  In the last 24 hours, has pain interfered with the following? General activity 3 Relation with others 4 Enjoyment of life 4 What TIME of day is your pain at its worst? NO PAIN Sleep (in general) Good  Pain is worse with: NO PAIN Pain improves with: NO PAIN Relief from Meds: 0  Mobility use a walker how many minutes can you walk? 2-3 ability to climb steps?  no do you drive?  no use a wheelchair  Function retired  Neuro/Psych weakness trouble walking confusion depression  Prior Studies Any changes since last visit?  no  Physicians involved in your care Any changes since last visit?  no   Family History  Problem Relation Age of Onset  . Diabetes Father   . Cancer Maternal Aunt   . Cancer Paternal Aunt   . Colon cancer Neg Hx    History   Social History  . Marital Status: Legally Separated    Spouse Name: N/A  . Number of Children: 3  . Years of Education: N/A   Occupational History  .     Social History Main Topics  . Smoking status: Current Every Day Smoker -- 0.50 packs/day for 18 years    Types: Cigarettes  . Smokeless tobacco: Never Used     Comment: smokes 12 cigarettes daily  . Alcohol  Use: No  . Drug Use: No  . Sexual Activity: No   Other Topics Concern  . None   Social History Narrative   Past Surgical History  Procedure Laterality Date  . Mastectomy, radical      right  . Breast lumpectomy      left  . Port-a-cath removal    . Colonoscopy  07/20/2005    RMR: Diminutive ascending colon polyp, status post resection . Inflamed focally adenomatous polyp  . Colonoscopy N/A 10/05/2013    RMR: Multiple rectal and colonic polyps-removed/treated. 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 other polyps tubular adenomas  . Flexible sigmoidoscopy N/A 10/11/2013    RMR: rectal polypectomy site identified, assitional resection and tatooing performed. 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 Flexible sigmoidoscopy N/A 11/24/2013    Procedure: FLEXIBLE SIGMOIDOSCOPY;  Surgeon: Daneil Dolin, MD;  Location: [facility];  Service: Endoscopy;  Laterality: N/A;  1:00-moved to [place] notified pt   Past Medical History  Diagnosis Date  . Leukocytoclastic vasculitis   . Diabetes mellitus   . Breast cancer     left 2007/right 1997  . Left-sided Breast cancer 07/22/2011  . Cardiomyopathy 07/22/2011  . COPD (chronic obstructive pulmonary disease) 07/22/2011  . Hypertension   . Hyperlipidemia   . 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  Stage II (T2 N0).  ER 73%, PR 90%, Her2 positive, Ki-67 19%.  2.1 cm in size.  Agreed to only have radiation, Herceptin, and Aromasin.  Right sided breast cancer: Stage II (T2 N0 M0).  2.5 cm in size.  Surgery on 04/28/1996 with axillary dissection on 05/10/1996.  11 negative nodes.  Treated with AC x 4 cycles for ER negative, PR positive at only 20%.    . Breast cancer, right breast 12/16/2013   BP 140/76 mmHg  Pulse 70  Resp 14  SpO2 96%  Opioid Risk Score:   Fall Risk Score: Low Fall Risk (0-5 points)`1  Depression screen PHQ 2/9  No flowsheet data  found.   Review of Systems  HENT: Negative.   Eyes: Negative.   Respiratory: Negative.   Cardiovascular: Negative.   Gastrointestinal: Negative.   Endocrine: Negative.   Genitourinary: Negative.   Musculoskeletal: Negative.   Skin: Negative.   Allergic/Immunologic: Negative.   Neurological: Positive for dizziness and weakness.       Trouble walking  Hematological: Negative.   Psychiatric/Behavioral: Positive for confusion and dysphoric mood. The patient is nervous/anxious.        Objective:   Physical Exam  Constitutional: She is oriented to person, place, and time. She appears well-developed and well-nourished.  HENT:  Head: Normocephalic and atraumatic.  Neurological: She is alert and oriented to person, place, and time.  Psychiatric: She has a normal mood and affect.  Nursing note and vitals reviewed.  Trunkal ataxia, wide based support Mild dysmetria       Assessment & Plan:  1.  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 Left-sided calf soreness exam unremarkable likely muscular related to gait asymmetry

## 2014-12-25 ENCOUNTER — Other Ambulatory Visit: Payer: Self-pay | Admitting: *Deleted

## 2014-12-26 NOTE — Patient Outreach (Addendum)
[facility]) Care Management   12/26/2014  Madison Todd 06/19/1951 [id number]  Madison Todd is an 64 y.o. female  Subjective: "I guess I have to do better on this sugar or I'm going to have problems down the line."  Objective:  Madison Todd is a 64 year old female with history of diabetes mellitus, cardiomyopathy, COPD, and hypertension. She presented in January with severe headache, blurred vision, right-sided weakness, and difficulty in swallowing.  She suffered a CVA and was admitted to the hospital from 10/01/14 - 10/23/2014.  Madison Todd was referred to [facility] for post hospital transition of care involvement and chronic disease management and intervention.   BP 130/70 mmHg  Pulse 82  SpO2 97%  Review of Systems  Constitutional: Negative.   HENT: Negative.   Eyes: Negative.   Respiratory: Negative.   Cardiovascular: Negative.   Gastrointestinal: Negative.   Genitourinary: Negative.   Musculoskeletal: Positive for myalgias.  Skin: Negative.   Neurological: Positive for focal weakness.       Persistent but improved right sided weakness  Psychiatric/Behavioral: Negative.     Physical Exam  Constitutional: She is oriented to person, place, and time. Vital signs are normal. She appears well-developed and well-nourished. She is active.  Cardiovascular: Normal rate, regular rhythm and normal heart sounds.   Respiratory: Effort normal and breath sounds normal.  GI: Soft. Bowel sounds are decreased.  Neurological: She is alert and oriented to person, place, and time. Gait abnormal.  Persistent but improved right lateral lean.  Skin: Skin is warm, dry and intact.  Psychiatric: She has a normal mood and affect. Her speech is normal and behavior is normal. Judgment and thought content normal. Cognition and memory are normal.    Current Medications:   Current Outpatient Prescriptions  Medication Sig Dispense Refill  .  acetaminophen (TYLENOL) 325 MG tablet Take 325 mg by mouth at bedtime as needed (help relax, sleep).     Marland Kitchen amLODipine (NORVASC) 10 MG tablet Take 1 tablet (10 mg total) by mouth daily. 30 tablet 1  . aspirin EC 81 MG tablet Take 81 mg by mouth daily.    . cholecalciferol (VITAMIN D) 1000 UNITS tablet Take 1,000 Units by mouth daily.    Marland Kitchen gabapentin (NEURONTIN) 300 MG capsule Take 1 capsule (300 mg total) by mouth at bedtime. 30 capsule 1  . Insulin Degludec 100 UNIT/ML SOPN Inject 4 Units into the skin daily.    . Linagliptin-Metformin HCl 2.01-999 MG TABS Take 1 tablet by mouth daily.    . metoprolol tartrate (LOPRESSOR) 25 MG tablet Take 0.5 tablets (12.5 mg total) by mouth 2 (two) times daily. 60 tablet 1   No current facility-administered medications for this visit.    Functional Status:   In your present state of health, do you have any difficulty performing the following activities: 12/25/2014 10/01/2014  Hearing? N N  Vision? N Y  Difficulty concentrating or making decisions? N N  Walking or climbing stairs? Y Y  Dressing or bathing? N Y  Doing errands, shopping? Tempie Donning  Preparing Food and eating ? N -  Using the Toilet? N -  In the past six months, have you accidently leaked urine? N -  Do you have problems with loss of bowel control? N -  Managing your Medications? Y -  Managing your Finances? N -  Housekeeping or managing your Housekeeping? Y -    Fall/Depression Screening:    PHQ 2/9 Scores  12/25/2014  PHQ - 2 Score 0    Assessment:    Recent Acute Health Condition (CVA) - Madison Todd is recovering well from her CVA; she finished HHPT last week and is to start outpatient PT; she has made noticeable improvement in mobility, strength, and stability of gait just since my last visit with her a month ago; she is seeing her providers as scheduled  Chronic Health Condition (DM) - Madison Todd has type II diabetes and admittedly is not giving careful attention to managing  this aspect of her health; she states she has been focused on recovery from her stroke and now that she is doing better feels its time to pay more attention to diabetes management; she does not adhere to prescribed carb modified diet but says she has cut down from 4 regular 12 oz Cokes a day to 2 each day; she admits to "not doing anything about eating" and has been enjoying lots of starchy/root vegetables and lots of fruits. She declines referral to the dietician and the [facility] but says she will consider doing this soon.  Madison Todd also has been taking Insulin Degludec (pen) 4u QD but takes it at various times during the day depending on her cbg finding. She is not checking cbg's regularly however and was not able to find her monitor. Her elderly aunt lives with her and Madison Todd admitted to using her aunt's meter intermittently. She wants a new meter so she can start using it regularly.   Madison Todd had good questions today: "what does your pancreas have to do with your diabetes?" "What makes diabetics lose their toes and feet?" I believe she is sincerely interested in making changes towards adherence to her prescribed plan of care for diabetes management.   We set small goals for this month - please see care plan section below.   Madison Todd wishes to have all prescriptions transferred to [facility].  Madison Todd requests new prescription for cbg meter and supplies be sent to [employer].   Plan:   I notified Dr. Cathey [facility] office of [name]'s requests as outlined above.   Routine Home Visit next - DM management.   [place]        Patient Outreach from 12/25/2014 in [place] Problem One  Elevated cbg's and HgA1C > recommended   Care Plan for Problem One  Active   Interventions for Problem One Long Term Goal  utilizing teachback method, reviewed rationale for checking  HGA1C and target/goal AND discussed goal and time frame for patient guided goal   [facility] Long Term Goal (31-90 days)  patient will have measureable improvement in glucose control over next 90 days as evidenced by improvement in HgA1C and 03/27/29 day average cbg's   [facility] Long Term Goal Start Date  11/23/14   [facility] CM Short Term Goal #1 (0-30 days)  patient will take newly prescribed Linagliptin-Metformin HCL daily as prescribed over the next 30 days as evidenced by pill count   [facility] CM Short Term Goal #1 Start Date  11/23/14   [facility] CM Short Term Goal #2 (0-30 days)  patient will check cbg BID over the next 30 days and record, calling provider for finding > 300 or <90 as evidenced by review of averages/call record   [facility] CM Short Term Goal #2 Start Date  11/23/14   [facility] CM Short Term Goal #3 (0-30 days)  patient will  be able to verbalize one consistent change made in dietary adherence over the next 30 days as evidenced by patient report   [facility] CM Short Term Goal #3 Start Date  12/25/14       [facility] Care Management  [phone]

## 2015-01-03 ENCOUNTER — Encounter: Payer: Self-pay | Admitting: *Deleted

## 2015-01-08 ENCOUNTER — Ambulatory Visit (HOSPITAL_COMMUNITY): Payer: Commercial Managed Care - HMO | Attending: Physical Medicine & Rehabilitation | Admitting: Physical Therapy

## 2015-01-08 DIAGNOSIS — R262 Difficulty in walking, not elsewhere classified: Secondary | ICD-10-CM | POA: Insufficient documentation

## 2015-01-08 DIAGNOSIS — R42 Dizziness and giddiness: Secondary | ICD-10-CM | POA: Diagnosis not present

## 2015-01-08 DIAGNOSIS — R29898 Other symptoms and signs involving the musculoskeletal system: Secondary | ICD-10-CM

## 2015-01-08 DIAGNOSIS — Z9181 History of falling: Secondary | ICD-10-CM | POA: Diagnosis not present

## 2015-01-08 DIAGNOSIS — I639 Cerebral infarction, unspecified: Secondary | ICD-10-CM | POA: Diagnosis not present

## 2015-01-08 DIAGNOSIS — G463 Brain stem stroke syndrome: Secondary | ICD-10-CM | POA: Insufficient documentation

## 2015-01-08 NOTE — Therapy (Signed)
[name], [name], [address] Phone: [phone]   Fax:  [phone]  Physical Therapy Evaluation  Patient Details  Name: Madison Todd MRN: [id number] Date of Birth: 01-07-1951 Referring Provider:  Charlett Blake, MD  Encounter Date: 01/08/2015      PT End of Session - 01/08/15 1208    Visit Number 1   Number of Visits 16   Date for PT Re-Evaluation 02/07/15   Authorization Type medicare/humana   Authorization - Visit Number 1   Authorization - Number of Visits 16   PT Start Time 1100   PT Stop Time 1145   PT Time Calculation (min) 45 min   Activity Tolerance Patient tolerated treatment well   Behavior During Therapy [facility] for tasks assessed/performed      Past Medical History  Diagnosis Date  . Leukocytoclastic vasculitis   . Diabetes mellitus   . Breast cancer     left 2007/right 1997  . Left-sided Breast cancer 07/22/2011  . Cardiomyopathy 07/22/2011  . COPD (chronic obstructive pulmonary disease) 07/22/2011  . Hypertension   . Hyperlipidemia   . 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  Stage II (T2 N0).  ER 73%, PR 90%, Her2 positive, Ki-67 19%.  2.1 cm in size.  Agreed to only have radiation, Herceptin, and Aromasin.  Right sided breast cancer: Stage II (T2 N0 M0).  2.5 cm in size.  Surgery on 04/28/1996 with axillary dissection on 05/10/1996.  11 negative nodes.  Treated with AC x 4 cycles for ER negative, PR positive at only 20%.    . Breast cancer, right breast 12/16/2013    Past Surgical History  Procedure Laterality Date  . Mastectomy, radical      right  . Breast lumpectomy      left  . Port-a-cath removal    . Colonoscopy  07/20/2005    RMR: Diminutive ascending colon polyp, status post resection . Inflamed focally adenomatous polyp  . Colonoscopy N/A 10/05/2013    RMR: Multiple rectal and colonic polyps-removed/treated.  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 other polyps tubular adenomas  . Flexible sigmoidoscopy N/A 10/11/2013    RMR: rectal polypectomy site identified, assitional resection and tatooing performed. 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 Flexible sigmoidoscopy N/A 11/24/2013    Procedure: FLEXIBLE SIGMOIDOSCOPY;  Surgeon: Daneil Dolin, MD;  [facility];  Service: Endoscopy;  Laterality: N/A;  1:00-moved to [place] notified pt    There were no vitals filed for this visit.  Visit Diagnosis:  Risk for falls - Plan: PT plan of care cert/re-cert  Weakness of right lower extremity - Plan: PT plan of care cert/re-cert  Difficulty walking - Plan: PT plan of care cert/re-cert      Subjective Assessment - 01/08/15 1111    Subjective Difficulty walking.    Pertinent History patient sustaines a CVA 09/27/14, patient had occupation, speech, and physical therapy in hospital and at home prior to today. patient no longer recievign speech or OT. patient notes continued difficulty walking, difficulty focussing and seeing in Rt eye, and patient contineus to have dizziness. Previously patient was indepeendent with all activities f daily living. Rt side feels weak, Lt side of body feels cold and right side of body feels warn, entire face feels a little cold., more sensation on Rt side of body and face  than Lt.    Patient Stated Goals Patient wants to be able to walk without walker, to be bale to see (likely outside of scope),    Currently in Pain? No/denies   Pain Score 0-No pain            [facility] PT Assessment - 01/08/15 0001    Assessment   Medical Diagnosis CVA/Wallenberg's syndrome   Onset Date 09/27/14   Next MD Visit 01/22/15 Alysia Penna   Prior Therapy yes   Balance Screen   Has the patient fallen in the past 6 months Yes   How many times? 2   Has the patient had a decrease in activity level because of a fear of falling?  No   Is the patient  reluctant to leave their home because of a fear of falling?  No   Prior Function   Level of Independence Independent with basic ADLs   Observation/Other Assessments   Focus on Therapeutic Outcomes (FOTO)  28% limited   Functional Tests   Functional tests Other;Other2;Sit to Stand   Sit to Stand   Comments 5x sit to stand in 12.67 seconds.    Other:   Other/ Comments 95mnute walk test: [id number] meters   Other:   Other/Comments visual assessment: tracking adn stability WNL.    ROM / Strength   AROM / PROM / Strength AROM;Strength   AROM   AROM Assessment Site Hip;Knee;Ankle   Right/Left Hip Right;Left   Strength   Strength Assessment Site Ankle;Knee;Hip   Right/Left Hip Right;Left   Right Hip Flexion 4+/5   Right Hip Extension 4/5   Right Hip ABduction 3/5   Left Hip Flexion 5/5   Left Hip Extension 4+/5   Left Hip ABduction 4/5   Right/Left Knee Right;Left   Right Knee Flexion 4/5   Right Knee Extension 4+/5   Left Knee Flexion 4+/5   Left Knee Extension 5/5   Right/Left Ankle Left;Right   Right Ankle Dorsiflexion 5/5   Left Ankle Dorsiflexion 5/5   Berg Balance Test   Sit to Stand Able to stand without using hands and stabilize independently   Standing Unsupported Able to stand safely 2 minutes   Sitting with Back Unsupported but Feet Supported on Floor or Stool Able to sit safely and securely 2 minutes   Stand to Sit Sits safely with minimal use of hands   Transfers Able to transfer safely, minor use of hands   Standing Unsupported with Eyes Closed Able to stand 10 seconds safely   Standing Ubsupported with Feet Together Able to place feet together independently and stand 1 minute safely   From Standing, Reach Forward with Outstretched Arm Can reach confidently >25 cm (10")   From Standing Position, Pick up Object from Floor Able to pick up shoe safely and easily   From Standing Position, Turn to Look Behind Over each Shoulder Looks behind from both sides and weight  shifts well   Turn 360 Degrees Able to turn 360 degrees safely but slowly   Standing Unsupported, Alternately Place Feet on Step/Stool Able to complete >2 steps/needs minimal assist   Standing Unsupported, One Foot in Front Able to take small step independently and hold 30 seconds   Standing on One Leg Tries to lift leg/unable to hold 3 seconds but remains standing independently   Total Score 46                   [facility] Adult PT Treatment/Exercise - 01/08/15  0001    Exercises   Exercises Knee/Hip   Knee/Hip Exercises: Standing   Functional Squat Limitations 5x sit to stand 2x.                 PT Education - 01/08/15 1149    Education provided Yes   Education Details HEP, Diagnosis, prognosis   Person(s) Educated Patient   Methods Explanation;Demonstration;Handout   Comprehension Verbalized understanding;Returned demonstration          PT Short Term Goals - 01/08/15 1215    PT SHORT TERM GOAL #1   Title patient will be able to ambualte 1000 ft during 6 minute walk test with cain indicatign improved gait efficency and    Time 4   Period Weeks   Status New   PT SHORT TERM GOAL #2   Title Patient will able to single elg stand > 3 seconds indicating decreased risk of falls.   Time 4   Period Weeks   Status New   PT SHORT TERM GOAL #3   Title Patient will be abel to dmeonstrate icnreased Glut medius strength to 4/5 bilaterally.    Time 4   Period Weeks   Status New   PT SHORT TERM GOAL #4   Title Patient will be independent with HEP.            PT Long Term Goals - 01/08/15 1216    PT LONG TERM GOAL #1   Title patient will be able to ambualte 1200 ft during 6 minute walk test with cain indicatign improved gait efficency and activity tolerance   Time 8   Period Weeks   Status New   PT LONG TERM GOAL #2   Title Patient will able to single leg stand > 10 seconds indicating decreased risk of falls.   Time 8   Period Weeks   Status New   PT LONG  TERM GOAL #3   Title Berg balance score to improve >52/56 indicating patient not at high risk of falls.    Time 8   Period Weeks   Status New   PT LONG TERM GOAL #4   Title patient will dmeonstrate glut max strength of 5/5/ to be able to ambualte up and down stairs withtou UE support.    Time 8   Period Weeks   Status New   PT LONG TERM GOAL #5   Title Patient will verbaly state no fear of falling.                Plan - 01/08/15 1208    Clinical Impression Statement Patient displays Rt sided weakness s/p CVA resulting in decreased balance Rt LE weakness and balance difficulties secondary to vestibular impairement secondary to CVA. Patient will benefit from skiled physical therapy with a focus on progressing LE strength and paower to decrease UE reliance for stability during gait to increase gait efficency and activity tolerance so patient can return to performing all ADL's independnetly.    Pt will benefit from skilled therapeutic intervention in order to improve on the following deficits Abnormal gait;Decreased endurance;Decreased strength;Impaired flexibility;Decreased activity tolerance;Difficulty walking;Decreased balance;Decreased coordination   Rehab Potential Good   PT Frequency 2x / week   PT Duration 8 weeks   PT Treatment/Interventions Neuromuscular re-education;Gait training;ADLs/Self Care Home Management;Stair training;Patient/family education;Functional mobility training;Therapeutic exercise;Therapeutic activities;Balance training   PT Next Visit Plan introduce LE stretches for pregait activities, 3D hip excursions and split stand 3D hip excursiosn per balance, continue sit to stand  with increased resistance, and progress gait to cain.    PT Home Exercise Plan 5x sit to stand 3x daily.    Consulted and Agree with Plan of Care Patient          G-Codes - 2015/01/14 1110    Functional Assessment Tool Used FOTO 28% imited   Functional Limitation Mobility: Walking and  moving around   Mobility: Walking and Moving Around Current Status [phone]) At least 20 percent but less than 40 percent impaired, limited or restricted   Mobility: Walking and Moving Around Goal Status [phone]) At least 1 percent but less than 20 percent impaired, limited or restricted       Problem List Patient Active Problem List   Diagnosis Date Noted  . 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 Dysphagia S/P CVA (cerebrovascular accident) 10/10/2014  . Type 2 diabetes mellitus not at goal 10/05/2014  . 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 Lacunar infarction 10/01/2014  . 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 CVA (cerebral infarction) 09/28/2014  . TIA (transient ischemic attack) 09/27/2014  . HTN (hypertension) 09/27/2014  . Breast cancer, right breast 12/16/2013  . 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 Adenomatous polyp 10/02/2013  . 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 Cardiomyopathy 07/22/2011  . COPD (chronic obstructive pulmonary disease) 07/22/2011    Devona Konig PT DPT [place], [name], [address] Phone: [phone]   Fax:  [phone]

## 2015-01-08 NOTE — Patient Instructions (Signed)
Functional Quadriceps: Sit to Stand   Sit on edge of chair, feet flat on floor. Stand upright, extending knees fully. Repeat 5 times per set. Do 1 sets per session. Do 3 sessions per day.  http://orth.exer.us/735   Copyright  VHI. All rights reserved.

## 2015-01-10 ENCOUNTER — Ambulatory Visit (HOSPITAL_COMMUNITY): Payer: Commercial Managed Care - HMO | Admitting: Physical Therapy

## 2015-01-10 DIAGNOSIS — R262 Difficulty in walking, not elsewhere classified: Secondary | ICD-10-CM | POA: Diagnosis not present

## 2015-01-10 DIAGNOSIS — Z9181 History of falling: Secondary | ICD-10-CM

## 2015-01-10 DIAGNOSIS — R29898 Other symptoms and signs involving the musculoskeletal system: Secondary | ICD-10-CM

## 2015-01-10 NOTE — Therapy (Signed)
[place], [name], [address] Phone: [phone]   Fax:  [phone]  Physical Therapy Treatment  Patient Details  Name: Madison Todd MRN: [id number] Date of Birth: 15-Sep-1950 Referring Provider:  Iona Beard, MD  Encounter Date: 01/10/2015      PT End of Session - 01/10/15 1157    Visit Number 2   Number of Visits 16   Date for PT Re-Evaluation 02/07/15   Authorization Type medicare/humana   Authorization - Visit Number 2   Authorization - Number of Visits 16   PT Start Time 1019   PT Stop Time 1101   PT Time Calculation (min) 42 min   Equipment Utilized During Treatment Gait belt   Activity Tolerance Patient tolerated treatment well   Behavior During Therapy [facility] for tasks assessed/performed      Past Medical History  Diagnosis Date  . Leukocytoclastic vasculitis   . Diabetes mellitus   . Breast cancer     left 2007/right 1997  . Left-sided Breast cancer 07/22/2011  . Cardiomyopathy 07/22/2011  . COPD (chronic obstructive pulmonary disease) 07/22/2011  . Hypertension   . Hyperlipidemia   . 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  Stage II (T2 N0).  ER 73%, PR 90%, Her2 positive, Ki-67 19%.  2.1 cm in size.  Agreed to only have radiation, Herceptin, and Aromasin.  Right sided breast cancer: Stage II (T2 N0 M0).  2.5 cm in size.  Surgery on 04/28/1996 with axillary dissection on 05/10/1996.  11 negative nodes.  Treated with AC x 4 cycles for ER negative, PR positive at only 20%.    . Breast cancer, right breast 12/16/2013    Past Surgical History  Procedure Laterality Date  . Mastectomy, radical      right  . Breast lumpectomy      left  . Port-a-cath removal    . Colonoscopy  07/20/2005    RMR: Diminutive ascending colon polyp, status post resection . Inflamed focally adenomatous polyp  . Colonoscopy N/A 10/05/2013    RMR: Multiple  rectal and colonic polyps-removed/treated. 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 other polyps tubular adenomas  . Flexible sigmoidoscopy N/A 10/11/2013    RMR: rectal polypectomy site identified, assitional resection and tatooing performed. 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 Flexible sigmoidoscopy N/A 11/24/2013    Procedure: FLEXIBLE SIGMOIDOSCOPY;  Surgeon: Daneil Dolin, MD;  [facility];  Service: Endoscopy;  Laterality: N/A;  1:00-moved to [place] notified pt    There were no vitals filed for this visit.  Visit Diagnosis:  Risk for falls  Weakness of right lower extremity  Difficulty walking      Subjective Assessment - 01/10/15 1151    Subjective Patient reports that she is still dizzy and very hesitant to go out in community; says that she is still hesitant to go out in community and really feels like her right side is still giving her issues with being tight   Pertinent History patient sustaines a CVA 09/27/14, patient had occupation, speech, and physical therapy in hospital and at home prior to today. patient no longer recievign speech or OT. patient notes continued difficulty walking, difficulty focussing and seeing in Rt eye, and patient contineus to have dizziness. Previously patient was indepeendent with all activities f daily living. Rt side feels weak, Lt side of body  feels cold and right side of body feels warn, entire face feels a little cold., more sensation on Rt side of body and face than Lt.    Currently in Pain? No/denies                         [facility] Adult PT Treatment/Exercise - 01/10/15 0001    Ambulation/Gait   Ambulation/Gait Yes   Ambulation/Gait Assistance 4: Min guard   Ambulation Distance (Feet) 200 Feet  also intermittent bouts of approx 20-30 ft through session   Assistive device None   Gait Pattern Step-through pattern;Decreased step length - right;Decreased step length - left;Decreased  stance time - right;Decreased dorsiflexion - right;Decreased dorsiflexion - left;Decreased trunk rotation;Trunk flexed;Narrow base of support;Poor foot clearance - left;Poor foot clearance - right   Ambulation Surface Level;Indoor   Knee/Hip Exercises: Stretches   Active Hamstring Stretch 3 reps;30 seconds   Active Hamstring Stretch Limitations 12 inch box    Piriformis Stretch 2 reps;30 seconds   Piriformis Stretch Limitations supine    Gastroc Stretch 3 reps;30 seconds   Gastroc Stretch Limitations slantboard    Knee/Hip Exercises: Standing   Other Standing Knee Exercises Reciprocal pre-gait training with focus on reciprocal motion of UEs/LEs, heel-toe stepping, cross-midline reaches with weight shifts    Knee/Hip Exercises: Supine   Other Supine Knee Exercises Corrected leg length discrepancy (L shorter than R) with moderate improvement of discrepancy however patient will likely require repeated correction              Balance Exercises - 01/10/15 1155    Balance Exercises: Standing   Standing Eyes Opened Narrow base of support (BOS);Solid surface;3 reps;20 secs;Other (comment)  Min guard to SBA    Tandem Stance Eyes open;3 reps;10 secs;Other (comment)  Min(A)           PT Education - 01/10/15 1156    Education provided Yes   Education Details education of use of no AD in PT, use of walker at home to reduce fall risk. Reviewed goals and gave pt handout of initial eval.    Person(s) Educated Patient   Methods Explanation   Comprehension Verbalized understanding          PT Short Term Goals - 01/08/15 1215    PT SHORT TERM GOAL #1   Title patient will be able to ambualte 1000 ft during 6 minute walk test with cain indicatign improved gait efficency and    Time 4   Period Weeks   Status New   PT SHORT TERM GOAL #2   Title Patient will able to single elg stand > 3 seconds indicating decreased risk of falls.   Time 4   Period Weeks   Status New   PT SHORT TERM  GOAL #3   Title Patient will be abel to dmeonstrate icnreased Glut medius strength to 4/5 bilaterally.    Time 4   Period Weeks   Status New   PT SHORT TERM GOAL #4   Title Patient will be independent with HEP.            PT Long Term Goals - 01/08/15 1216    PT LONG TERM GOAL #1   Title patient will be able to ambualte 1200 ft during 6 minute walk test with cain indicatign improved gait efficency and activity tolerance   Time 8   Period Weeks   Status New   PT LONG TERM GOAL #2   Title  Patient will able to single leg stand > 10 seconds indicating decreased risk of falls.   Time 8   Period Weeks   Status New   PT LONG TERM GOAL #3   Title Berg balance score to improve >52/56 indicating patient not at high risk of falls.    Time 8   Period Weeks   Status New   PT LONG TERM GOAL #4   Title patient will dmeonstrate glut max strength of 5/5/ to be able to ambualte up and down stairs withtou UE support.    Time 8   Period Weeks   Status New   PT LONG TERM GOAL #5   Title Patient will verbaly state no fear of falling.                Plan - 01/10/15 1157    Clinical Impression Statement Focused on functional reciprocal movement, pre-gait, gait, and balance work today. Patient did require Min cues for proper sequencing and performance of techniques, demonstrated dificulty with weight shift during cross-midline reach in standing. Gait without AD during session with significant deviations however patient did not incorporate cues to correct well today, possibly due to combination of dizziness and fatigue. Impaired functional activity tolerance throughout session. Patient appears motivated to improve her gait and mobility overall.    Pt will benefit from skilled therapeutic intervention in order to improve on the following deficits Abnormal gait;Decreased endurance;Decreased strength;Impaired flexibility;Decreased activity tolerance;Difficulty walking;Decreased balance;Decreased  coordination   Rehab Potential Good   PT Frequency 2x / week   PT Duration 8 weeks   PT Treatment/Interventions Neuromuscular re-education;Gait training;ADLs/Self Care Home Management;Stair training;Patient/family education;Functional mobility training;Therapeutic exercise;Therapeutic activities;Balance training   PT Next Visit Plan Functional stretching and strengthening; pregait and gait activities; reciprocal tasks in standing; balance work; gait training with and without cane; functional activity tolerance    PT Home Exercise Plan 5x sit to stand 3x daily.    Consulted and Agree with Plan of Care Patient        Problem List Patient Active Problem List   Diagnosis Date Noted  . 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 Dysphagia S/P CVA (cerebrovascular accident) 10/10/2014  . Type 2 diabetes mellitus not at goal 10/05/2014  . 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 Lacunar infarction 10/01/2014  . 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 CVA (cerebral infarction) 09/28/2014  . TIA (transient ischemic attack) 09/27/2014  . HTN (hypertension) 09/27/2014  . Breast cancer, right breast 12/16/2013  . 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 Adenomatous polyp 10/02/2013  . 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 Cardiomyopathy 07/22/2011  . COPD (chronic obstructive pulmonary disease) 07/22/2011    Deniece Ree PT, DPT [place] [address] [place], [name], [address] Phone: [phone]   Fax:  [phone]

## 2015-01-16 ENCOUNTER — Ambulatory Visit (HOSPITAL_COMMUNITY): Payer: Commercial Managed Care - HMO | Attending: Physical Medicine & Rehabilitation

## 2015-01-16 ENCOUNTER — Ambulatory Visit (HOSPITAL_COMMUNITY): Payer: Medicare HMO | Admitting: Hematology & Oncology

## 2015-01-16 DIAGNOSIS — R42 Dizziness and giddiness: Secondary | ICD-10-CM | POA: Insufficient documentation

## 2015-01-16 DIAGNOSIS — G463 Brain stem stroke syndrome: Secondary | ICD-10-CM | POA: Insufficient documentation

## 2015-01-16 DIAGNOSIS — I639 Cerebral infarction, unspecified: Secondary | ICD-10-CM | POA: Insufficient documentation

## 2015-01-16 DIAGNOSIS — Z9181 History of falling: Secondary | ICD-10-CM | POA: Diagnosis not present

## 2015-01-16 DIAGNOSIS — R262 Difficulty in walking, not elsewhere classified: Secondary | ICD-10-CM | POA: Diagnosis not present

## 2015-01-16 DIAGNOSIS — R29898 Other symptoms and signs involving the musculoskeletal system: Secondary | ICD-10-CM

## 2015-01-16 NOTE — Therapy (Signed)
[place], [name], [address] Phone: [phone]   Fax:  [phone]  Physical Therapy Treatment  Patient Details  Name: Madison Todd MRN: [id number] Date of Birth: 1951-04-06 Referring Provider:  Charlett Blake, MD  Encounter Date: 01/16/2015      PT End of Session - 01/16/15 1430    Visit Number 3   Number of Visits 16   Date for PT Re-Evaluation 02/07/15   Authorization Type medicare/humana   Authorization - Visit Number 3   Authorization - Number of Visits 16   PT Start Time 7628   PT Stop Time 1430   PT Time Calculation (min) 42 min   Activity Tolerance Patient limited by fatigue;Patient tolerated treatment well   Behavior During Therapy [facility] for tasks assessed/performed      Past Medical History  Diagnosis Date  . Leukocytoclastic vasculitis   . Diabetes mellitus   . Breast cancer     left 2007/right 1997  . Left-sided Breast cancer 07/22/2011  . Cardiomyopathy 07/22/2011  . COPD (chronic obstructive pulmonary disease) 07/22/2011  . Hypertension   . Hyperlipidemia   . 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  Stage II (T2 N0).  ER 73%, PR 90%, Her2 positive, Ki-67 19%.  2.1 cm in size.  Agreed to only have radiation, Herceptin, and Aromasin.  Right sided breast cancer: Stage II (T2 N0 M0).  2.5 cm in size.  Surgery on 04/28/1996 with axillary dissection on 05/10/1996.  11 negative nodes.  Treated with AC x 4 cycles for ER negative, PR positive at only 20%.    . Breast cancer, right breast 12/16/2013    Past Surgical History  Procedure Laterality Date  . Mastectomy, radical      right  . Breast lumpectomy      left  . Port-a-cath removal    . Colonoscopy  07/20/2005    RMR: Diminutive ascending colon polyp, status post resection . Inflamed focally adenomatous polyp  . Colonoscopy N/A 10/05/2013    RMR: Multiple rectal and colonic  polyps-removed/treated. 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 other polyps tubular adenomas  . Flexible sigmoidoscopy N/A 10/11/2013    RMR: rectal polypectomy site identified, assitional resection and tatooing performed. 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 Flexible sigmoidoscopy N/A 11/24/2013    Procedure: FLEXIBLE SIGMOIDOSCOPY;  Surgeon: Daneil Dolin, MD;  [facility];  Service: Endoscopy;  Laterality: N/A;  1:00-moved to [place] notified pt    There were no vitals filed for this visit.  Visit Diagnosis:  Risk for falls  Weakness of right lower extremity  Difficulty walking      Subjective Assessment - 01/16/15 1353    Subjective Pt reported dizziness and vision are most trouble pt. has at home.  Reports compliance with HEP   Pertinent History patient sustaines a CVA 09/27/14, patient had occupation, speech, and physical therapy in hospital and at home prior to today. patient no longer recievign speech or OT. patient notes continued difficulty walking, difficulty focussing and seeing in Rt eye, and patient contineus to have dizziness. Previously patient was indepeendent with all activities f daily living. Rt side feels weak, Lt side of body feels cold and right side of body feels warn, entire face feels a little cold., more sensation on Rt side of body and face than Lt.  Currently in Pain? No/denies                         [facility] Adult PT Treatment/Exercise - 01/16/15 0001    Ambulation/Gait   Ambulation/Gait Yes   Ambulation/Gait Assistance 4: Min guard   Ambulation Distance (Feet) 750 Feet   Assistive device None   Gait Pattern Step-through pattern;Decreased step length - right;Decreased step length - left;Decreased stance time - right;Decreased dorsiflexion - right;Decreased dorsiflexion - left;Decreased trunk rotation;Trunk flexed;Narrow base of support;Poor foot clearance - left;Poor foot clearance - right    Ambulation Surface Level;Indoor   Exercises   Exercises Knee/Hip   Knee/Hip Exercises: Stretches   Gastroc Stretch 3 reps;30 seconds   Gastroc Stretch Limitations slantboard    Knee/Hip Exercises: Standing   Heel Raises 15 reps   Heel Raises Limitations toe raises 15x   Gait Training Reciprocal pre-gait training with focus on reciprocal motion of UEs/LEs, heel-toe stepping, cross-midline reaches with weight shifts    Other Standing Knee Exercises 3D hip excursion 2x 10 split stance   Other Standing Knee Exercises 10 STS no HHA             Balance Exercises - 01/16/15 1427    Balance Exercises: Standing   Standing Eyes Opened Narrow base of support (BOS);3 reps   Standing Eyes Closed 1 rep;10 secs   Tandem Stance Eyes open;3 reps;30 secs   Tandem Gait 1 rep   Retro Gait 1 rep   Sidestepping 1 rep             PT Short Term Goals - 01/16/15 1456    PT SHORT TERM GOAL #1   Title patient will be able to ambualte 1000 ft during 6 minute walk test with cain indicatign improved gait efficency and    Status On-going   PT SHORT TERM GOAL #2   Title Patient will able to single elg stand > 3 seconds indicating decreased risk of falls.   PT SHORT TERM GOAL #3   Title Patient will be abel to dmeonstrate icnreased Glut medius strength to 4/5 bilaterally.    PT SHORT TERM GOAL #4   Title Patient will be independent with HEP.    Status On-going           PT Long Term Goals - 01/16/15 1459    PT LONG TERM GOAL #1   Title patient will be able to ambualte 1200 ft during 6 minute walk test with cain indicatign improved gait efficency and activity tolerance   PT LONG TERM GOAL #2   Title Patient will able to single leg stand > 10 seconds indicating decreased risk of falls.   PT LONG TERM GOAL #3   Title Berg balance score to improve >52/56 indicating patient not at high risk of falls.    Status On-going   PT LONG TERM GOAL #4   Title patient will dmeonstrate glut max strength  of 5/5/ to be able to ambualte up and down stairs withtou UE support.    PT LONG TERM GOAL #5   Title Patient will verbaly state no fear of falling.                Plan - 01/16/15 1431    Clinical Impression Statement Session focus on improving reciprocal movement with gait mechanics with no AD and progressing balance activities.  Min cueing for proper sequence with UE and LE pattern and noted staggered gait mechanics due  to weakness.  Began 3D hip excursion for hip mobilty and gluteal strengthening, pt able to perform with min cueing for weight loading with squats.  Progressed balance activities with gait including tandem, retro gait and sidestepping with min assistance required.  Pt limited by fatigue and c/o dizziness (thought to contribute to staggered gai) through session, required 2 seated rest breaks   PT Next Visit Plan Functional stretching and strengthening; pregait and gait activities; reciprocal tasks in standing; balance work; gait training with and without cane; functional activity tolerance         Problem List Patient Active Problem List   Diagnosis Date Noted  . 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 Dysphagia S/P CVA (cerebrovascular accident) 10/10/2014  . Type 2 diabetes mellitus not at goal 10/05/2014  . 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 Lacunar infarction 10/01/2014  . 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 CVA (cerebral infarction) 09/28/2014  . TIA (transient ischemic attack) 09/27/2014  . HTN (hypertension) 09/27/2014  . Breast cancer, right breast 12/16/2013  . 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 Adenomatous polyp 10/02/2013  . 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 Cardiomyopathy 07/22/2011  . COPD (chronic obstructive pulmonary disease) 07/22/2011   Ihor Austin, [place]; [place] #[id number] [phone]  Aldona Lento 01/16/2015, 3:54  PM  [place] [address] [place], [name], [address] Phone: [phone]   Fax:  [phone]

## 2015-01-22 ENCOUNTER — Ambulatory Visit (HOSPITAL_BASED_OUTPATIENT_CLINIC_OR_DEPARTMENT_OTHER): Payer: Commercial Managed Care - HMO | Admitting: Physical Medicine & Rehabilitation

## 2015-01-22 ENCOUNTER — Encounter: Payer: Self-pay | Admitting: Physical Medicine & Rehabilitation

## 2015-01-22 ENCOUNTER — Encounter: Payer: Commercial Managed Care - HMO | Attending: Physical Medicine & Rehabilitation

## 2015-01-22 VITALS — BP 156/94 | HR 78 | Resp 16

## 2015-01-22 DIAGNOSIS — H8141 Vertigo of central origin, right ear: Secondary | ICD-10-CM | POA: Insufficient documentation

## 2015-01-22 DIAGNOSIS — R270 Ataxia, unspecified: Secondary | ICD-10-CM | POA: Diagnosis not present

## 2015-01-22 DIAGNOSIS — G463 Brain stem stroke syndrome: Principal | ICD-10-CM

## 2015-01-22 DIAGNOSIS — G464 Cerebellar stroke syndrome: Secondary | ICD-10-CM | POA: Diagnosis not present

## 2015-01-22 DIAGNOSIS — I69391 Dysphagia following cerebral infarction: Secondary | ICD-10-CM | POA: Insufficient documentation

## 2015-01-22 NOTE — Patient Instructions (Signed)
Please restart her gabapentin taken about an hour before you go to bed.

## 2015-01-22 NOTE — Progress Notes (Signed)
Subjective:    Patient ID: Madison Todd, female    DOB: 08/02/1951, 64 y.o.   MRN: [id number] September 27, 2014,.  MRI of the brain showed acute right lateral medullary infarct as well as suspected inferior right lateral cerebellar hemisphere involvement HPI Stopped taking gabapentin due to dizziness Wakes up at night feels warm on Left side of body  Pain Inventory Average Pain 0 Pain Right Now 0 My pain is no pain  In the last 24 hours, has pain interfered with the following? General activity 0 Relation with others 0 Enjoyment of life 0 What TIME of day is your pain at its worst? no pain Sleep (in general) no pain  Pain is worse with: no pain Pain improves with: no pain Relief from Meds: no pain  Mobility walk with assistance use a walker  Function retired I need assistance with the following:  household duties and shopping  Neuro/Psych weakness numbness tingling dizziness  Prior Studies Any changes since last visit?  no  Physicians involved in your care Any changes since last visit?  no   Family History  Problem Relation Age of Onset  . Diabetes Father   . Cancer Maternal Aunt   . Cancer Paternal Aunt   . Colon cancer Neg Hx    History   Social History  . Marital Status: Legally Separated    Spouse Name: N/A  . Number of Children: 3  . Years of Education: N/A   Occupational History  .     Social History Main Topics  . Smoking status: Current Every Day Smoker -- 0.50 packs/day for 18 years    Types: Cigarettes  . Smokeless tobacco: Never Used     Comment: smokes 12 cigarettes daily  . Alcohol Use: No  . Drug Use: No  . Sexual Activity: No   Other Topics Concern  . None   Social History Narrative   Past Surgical History  Procedure Laterality Date  . Mastectomy, radical      right  . Breast lumpectomy      left  . Port-a-cath removal    . Colonoscopy  07/20/2005    RMR: Diminutive ascending colon polyp, status post  resection . Inflamed focally adenomatous polyp  . Colonoscopy N/A 10/05/2013    RMR: Multiple rectal and colonic polyps-removed/treated. 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 other polyps tubular adenomas  . Flexible sigmoidoscopy N/A 10/11/2013    RMR: rectal polypectomy site identified, assitional resection and tatooing performed. 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 Flexible sigmoidoscopy N/A 11/24/2013    Procedure: FLEXIBLE SIGMOIDOSCOPY;  Surgeon: Daneil Dolin, MD;  [facility];  Service: Endoscopy;  Laterality: N/A;  1:00-moved to [place] notified pt   Past Medical History  Diagnosis Date  . Leukocytoclastic vasculitis   . Diabetes mellitus   . Breast cancer     left 2007/right 1997  . Left-sided Breast cancer 07/22/2011  . Cardiomyopathy 07/22/2011  . COPD (chronic obstructive pulmonary disease) 07/22/2011  . Hypertension   . Hyperlipidemia   . 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  Stage II (T2 N0).  ER 73%, PR 90%, Her2 positive, Ki-67 19%.  2.1 cm in size.  Agreed to only have radiation, Herceptin, and Aromasin.  Right sided breast cancer: Stage II (T2 N0 M0).  2.5 cm in size.  Surgery on 04/28/1996 with axillary dissection  on 05/10/1996.  11 negative nodes.  Treated with AC x 4 cycles for ER negative, PR positive at only 20%.    . Breast cancer, right breast 12/16/2013   BP 156/94 mmHg  Pulse 78  Resp 16  SpO2 95%  Opioid Risk Score:   Fall Risk Score: Moderate Fall Risk (6-13 points) (educated and given handout on fall prevention at home today)`1  Depression screen [facility] 2/9  Depression screen [facility] 2/9 01/22/2015 12/25/2014  Decreased Interest 0 0  Down, Depressed, Hopeless 0 0  PHQ - 2 Score 0 0  Altered sleeping 0 -  Tired, decreased energy 1 -  Change in appetite 0 -  Feeling bad or failure about yourself  0 -  Trouble concentrating 0 -  Moving slowly or  fidgety/restless 0 -  Suicidal thoughts 0 -  PHQ-9 Score 1 -     Review of Systems  Constitutional: Positive for diaphoresis.  Respiratory: Positive for shortness of breath.   Endocrine:       Blood sugar flucuations  Neurological: Positive for dizziness, weakness and numbness.       Tingling  All other systems reviewed and are negative.      Objective:   Physical Exam  Left upper and left lower extremity decreased sensation to pinprick, right facial decreased sensation to pinprick Dysmetria right finger-nose-finger Intact right heel to shin Wide base of support on gait, ambulates with standby assist without assisted device Romberg positive Motor strength 5/5 bilateral deltoids biceps triceps grip, hip flexor and extensor ankle dorsal flexor Full range of motion bilateral shoulders without pain Voice is hoarse     Assessment & Plan:  1. Wallenberg syndrome with ipsilateral ataxia as well as ipsilateral facial sensory deficits and contralateral limb and hemibody sensory deficit. We went over her complaints and discussed how they are part of her stroke syndrome  Discussed with patient prognosis for recovery, should plateau at around 6 months post stroke which would be in July.  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

## 2015-01-23 ENCOUNTER — Telehealth (HOSPITAL_COMMUNITY): Payer: Self-pay

## 2015-01-23 ENCOUNTER — Ambulatory Visit (HOSPITAL_COMMUNITY): Payer: Commercial Managed Care - HMO

## 2015-01-23 DIAGNOSIS — R29898 Other symptoms and signs involving the musculoskeletal system: Secondary | ICD-10-CM

## 2015-01-23 DIAGNOSIS — R262 Difficulty in walking, not elsewhere classified: Secondary | ICD-10-CM | POA: Diagnosis not present

## 2015-01-23 DIAGNOSIS — Z9181 History of falling: Secondary | ICD-10-CM

## 2015-01-23 NOTE — Telephone Encounter (Signed)
Patient declined OT referral and doesn't want to schedule at this time. NF 01/23/2015 10:27 am.  01/17/15 Has OT referral too - need to schedule

## 2015-01-23 NOTE — Therapy (Signed)
[place], [name], [address] Phone: [phone]   Fax:  [phone]  Physical Therapy Treatment  Patient Details  Name: Madison Todd MRN: [id number] Date of Birth: Jan 21, 1951 Referring Provider:  Charlett Blake, MD  Encounter Date: 01/23/2015      PT End of Session - 01/23/15 1102    Visit Number 4   Number of Visits 16   Date for PT Re-Evaluation 02/07/15   Authorization Type medicare/humana   Authorization - Visit Number 4   Authorization - Number of Visits 16   PT Start Time 1028   PT Stop Time 1108   PT Time Calculation (min) 40 min   Equipment Utilized During Treatment Gait belt   Activity Tolerance Patient limited by fatigue;Patient tolerated treatment well   Behavior During Therapy [facility] for tasks assessed/performed      Past Medical History  Diagnosis Date  . Leukocytoclastic vasculitis   . Diabetes mellitus   . Breast cancer     left 2007/right 1997  . Left-sided Breast cancer 07/22/2011  . Cardiomyopathy 07/22/2011  . COPD (chronic obstructive pulmonary disease) 07/22/2011  . Hypertension   . Hyperlipidemia   . 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  Stage II (T2 N0).  ER 73%, PR 90%, Her2 positive, Ki-67 19%.  2.1 cm in size.  Agreed to only have radiation, Herceptin, and Aromasin.  Right sided breast cancer: Stage II (T2 N0 M0).  2.5 cm in size.  Surgery on 04/28/1996 with axillary dissection on 05/10/1996.  11 negative nodes.  Treated with AC x 4 cycles for ER negative, PR positive at only 20%.    . Breast cancer, right breast 12/16/2013    Past Surgical History  Procedure Laterality Date  . Mastectomy, radical      right  . Breast lumpectomy      left  . Port-a-cath removal    . Colonoscopy  07/20/2005    RMR: Diminutive ascending colon polyp, status post resection . Inflamed focally adenomatous polyp  . Colonoscopy  N/A 10/05/2013    RMR: Multiple rectal and colonic polyps-removed/treated. 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 other polyps tubular adenomas  . Flexible sigmoidoscopy N/A 10/11/2013    RMR: rectal polypectomy site identified, assitional resection and tatooing performed. 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 Flexible sigmoidoscopy N/A 11/24/2013    Procedure: FLEXIBLE SIGMOIDOSCOPY;  Surgeon: Daneil Dolin, MD;  [facility];  Service: Endoscopy;  Laterality: N/A;  1:00-moved to [place] notified pt    There were no vitals filed for this visit.  Visit Diagnosis:  Risk for falls  Weakness of right lower extremity  Difficulty walking      Subjective Assessment - 01/23/15 1037    Subjective Pt reported no pain, c/o dizziness rated 4/10 at beginning of session.     Currently in Pain? No/denies            [facility] PT Assessment - 01/23/15 0001    Assessment   Medical Diagnosis CVA/Wallenberg's syndrome   Onset Date 09/27/14   Next MD Visit 03/04/15 Alysia Penna   Prior Therapy yes                     [facility] Adult PT Treatment/Exercise - 01/23/15 0001    Exercises   Exercises Knee/Hip   Knee/Hip Exercises:  Aerobic   Stationary Bike Nustep 37mnutes hill level 3 resistance 3 SPM >90    Knee/Hip Exercises: Standing   Rocker Board 2 minutes   Rocker Board Limitations R/L and A/P   Gait Training Reciprocal pre-gait training with focus on reciprocal motion of UEs/LEs, heel-toe stepping, cross-midline reaches with weight shifts;  Standing in place high march with opposite UE movements then gait,   Other Standing Knee Exercises 3D hip excursion 2x 10 split stance   Other Standing Knee Exercises 10 STS no HHA             Balance Exercises - 01/23/15 1058    Balance Exercises: Standing   Tandem Gait 1 rep   Retro Gait 1 rep   Sidestepping 1 rep             PT Short Term Goals - 01/23/15 1025    PT SHORT  TERM GOAL #1   Title patient will be able to ambualte 1000 ft during 6 minute walk test with cain indicatign improved gait efficency and    Status On-going   PT SHORT TERM GOAL #2   Title Patient will able to single elg stand > 3 seconds indicating decreased risk of falls.   Status On-going   PT SHORT TERM GOAL #3   Title Patient will be abel to dmeonstrate icnreased Glut medius strength to 4/5 bilaterally.    Status On-going   PT SHORT TERM GOAL #4   Title Patient will be independent with HEP.    Status On-going           PT Long Term Goals - 01/23/15 1025    PT LONG TERM GOAL #1   Title patient will be able to ambualte 1200 ft during 6 minute walk test with cain indicatign improved gait efficency and activity tolerance   PT LONG TERM GOAL #2   Title Patient will able to single leg stand > 10 seconds indicating decreased risk of falls.   PT LONG TERM GOAL #3   Title Berg balance score to improve >52/56 indicating patient not at high risk of falls.    PT LONG TERM GOAL #4   Title patient will dmeonstrate glut max strength of 5/5/ to be able to ambualte up and down stairs withtou UE support.    PT LONG TERM GOAL #5   Title Patient will verbaly state no fear of falling.                Plan - 01/23/15 1206    Clinical Impression Statement Pt late for apt today.  Session focus on improving gait mechanics with cueing to improve reciprocal movements with arm swing during gait.  Added pre gait activities to improve sequence.  Pt limited by fatigue through session.  Added Nustep at end of session to improve activity tolerance with UE and LE to improve reciprocal pattern.  No reports of pain through session.     PT Next Visit Plan Functional stretching and strengthening; pregait and gait activities; reciprocal tasks in standing; balance work; gait training with and without cane; functional activity tolerance   Give advance strenghtening HEP next session.          Problem  List Patient Active Problem List   Diagnosis Date Noted  . 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 Dysphagia S/P CVA (cerebrovascular accident) 10/10/2014  . Type 2 diabetes mellitus not at goal 10/05/2014  . 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 Lacunar  infarction 10/01/2014  . 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 CVA (cerebral infarction) 09/28/2014  . TIA (transient ischemic attack) 09/27/2014  . HTN (hypertension) 09/27/2014  . Breast cancer, right breast 12/16/2013  . 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 Adenomatous polyp 10/02/2013  . 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 Cardiomyopathy 07/22/2011  . COPD (chronic obstructive pulmonary disease) 07/22/2011   Ihor Austin, [place]; [place] #[id number] [phone]  Aldona Lento 01/23/2015, 12:09 PM  [place] [address] [place], [name], [address] Phone: [phone]   Fax:  [phone]

## 2015-01-29 ENCOUNTER — Ambulatory Visit: Payer: Commercial Managed Care - HMO | Admitting: *Deleted

## 2015-01-29 ENCOUNTER — Ambulatory Visit (HOSPITAL_COMMUNITY): Payer: Commercial Managed Care - HMO | Admitting: Physical Therapy

## 2015-01-29 DIAGNOSIS — Z9181 History of falling: Secondary | ICD-10-CM

## 2015-01-29 DIAGNOSIS — R262 Difficulty in walking, not elsewhere classified: Secondary | ICD-10-CM

## 2015-01-29 DIAGNOSIS — R29898 Other symptoms and signs involving the musculoskeletal system: Secondary | ICD-10-CM

## 2015-01-29 NOTE — Therapy (Signed)
[place], [name], [address] Phone: [phone]   Fax:  [phone]  Physical Therapy Treatment  Patient Details  Name: SHRINIKA BLATZ MRN: [id number] Date of Birth: 06/10/1951 Referring Provider:  Charlett Blake, MD  Encounter Date: 01/29/2015      PT End of Session - 01/29/15 1053    Visit Number 5   Number of Visits 16   Date for PT Re-Evaluation 02/07/15   Authorization Type medicare/humana   Authorization - Visit Number 5   Authorization - Number of Visits 16   PT Start Time 3016   PT Stop Time 1100   PT Time Calculation (min) 45 min   Equipment Utilized During Treatment Gait belt   Activity Tolerance Patient limited by fatigue;Patient tolerated treatment well   Behavior During Therapy [facility] for tasks assessed/performed      Past Medical History  Diagnosis Date  . Leukocytoclastic vasculitis   . Diabetes mellitus   . Breast cancer     left 2007/right 1997  . Left-sided Breast cancer 07/22/2011  . Cardiomyopathy 07/22/2011  . COPD (chronic obstructive pulmonary disease) 07/22/2011  . Hypertension   . Hyperlipidemia   . 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  Stage II (T2 N0).  ER 73%, PR 90%, Her2 positive, Ki-67 19%.  2.1 cm in size.  Agreed to only have radiation, Herceptin, and Aromasin.  Right sided breast cancer: Stage II (T2 N0 M0).  2.5 cm in size.  Surgery on 04/28/1996 with axillary dissection on 05/10/1996.  11 negative nodes.  Treated with AC x 4 cycles for ER negative, PR positive at only 20%.    . Breast cancer, right breast 12/16/2013    Past Surgical History  Procedure Laterality Date  . Mastectomy, radical      right  . Breast lumpectomy      left  . Port-a-cath removal    . Colonoscopy  07/20/2005    RMR: Diminutive ascending colon polyp, status post resection . Inflamed focally adenomatous polyp  . Colonoscopy  N/A 10/05/2013    RMR: Multiple rectal and colonic polyps-removed/treated. 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 other polyps tubular adenomas  . Flexible sigmoidoscopy N/A 10/11/2013    RMR: rectal polypectomy site identified, assitional resection and tatooing performed. 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 Flexible sigmoidoscopy N/A 11/24/2013    Procedure: FLEXIBLE SIGMOIDOSCOPY;  Surgeon: Daneil Dolin, MD;  [facility];  Service: Endoscopy;  Laterality: N/A;  1:00-moved to [place] notified pt    There were no vitals filed for this visit.  Visit Diagnosis:  Weakness of right lower extremity  Risk for falls  Difficulty walking      Subjective Assessment - 01/29/15 1059    Subjective Pt states she is feeling good today.  NO pain or complaints   Currently in Pain? No/denies                         [facility] Adult PT Treatment/Exercise - 01/29/15 1024    Knee/Hip Exercises: Stretches   Active Hamstring Stretch 3 reps;30 seconds   Active Hamstring Stretch Limitations 12 inch box    Gastroc Stretch 3 reps;30 seconds   Gastroc Stretch Limitations slantboard    Knee/Hip Exercises: Aerobic   Stationary Bike Nustep 64mnutes hill level 3 resistance 3 SPM >  90    Knee/Hip Exercises: Standing   Heel Raises 15 reps   Heel Raises Limitations toe raises 15x   Rocker Board 2 minutes   Rocker Board Limitations R/L and A/P   Gait Training Reciprocal pre-gait training with focus on reciprocal motion of UEs/LEs, heel-toe stepping, cross-midline reaches with weight shifts;  Standing in place high march with opposite UE movements then gait,   Other Standing Knee Exercises 3D hip excursion 2x 10 split stance   Other Standing Knee Exercises 10 STS no HHA             Balance Exercises - 01/29/15 1026    Balance Exercises: Standing   Tandem Gait 1 rep   Retro Gait 1 rep   Sidestepping 1 rep             PT Short Term  Goals - 01/23/15 1025    PT SHORT TERM GOAL #1   Title patient will be able to ambualte 1000 ft during 6 minute walk test with cain indicatign improved gait efficency and    Status On-going   PT SHORT TERM GOAL #2   Title Patient will able to single elg stand > 3 seconds indicating decreased risk of falls.   Status On-going   PT SHORT TERM GOAL #3   Title Patient will be abel to dmeonstrate icnreased Glut medius strength to 4/5 bilaterally.    Status On-going   PT SHORT TERM GOAL #4   Title Patient will be independent with HEP.    Status On-going           PT Long Term Goals - 01/23/15 1025    PT LONG TERM GOAL #1   Title patient will be able to ambualte 1200 ft during 6 minute walk test with cain indicatign improved gait efficency and activity tolerance   PT LONG TERM GOAL #2   Title Patient will able to single leg stand > 10 seconds indicating decreased risk of falls.   PT LONG TERM GOAL #3   Title Berg balance score to improve >52/56 indicating patient not at high risk of falls.    PT LONG TERM GOAL #4   Title patient will dmeonstrate glut max strength of 5/5/ to be able to ambualte up and down stairs withtou UE support.    PT LONG TERM GOAL #5   Title Patient will verbaly state no fear of falling.                Plan - 01/29/15 1053    Clinical Impression Statement Contiued focus on increasing balance and coordination with reciprocal movements.  PT with difficulty coordinating UE/LE with movements but most limited by fatigue, requiring 3 seated rest breaks during session today.  No new actvities added this session due to fatigue.  Continued with nustep at end of session.  NO pain reported.   PT Next Visit Plan Continue to progress UE/LE coordination tasks incorporating balance challenge.          Problem List Patient Active Problem List   Diagnosis Date Noted  . 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 Dysphagia S/P  CVA (cerebrovascular accident) 10/10/2014  . Type 2 diabetes mellitus not at goal 10/05/2014  . 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 Lacunar infarction 10/01/2014  . 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 CVA (cerebral infarction) 09/28/2014  . TIA (transient ischemic attack) 09/27/2014  . HTN (hypertension) 09/27/2014  . Breast cancer, right breast  12/16/2013  . 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 Adenomatous polyp 10/02/2013  . 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 Cardiomyopathy 07/22/2011  . COPD (chronic obstructive pulmonary disease) 07/22/2011    Teena Irani, PTA/CLT [phone]  01/29/2015, 10:59 AM  [place], [name], [address] Phone: [phone]   Fax:  [phone]

## 2015-01-31 ENCOUNTER — Ambulatory Visit (HOSPITAL_COMMUNITY): Payer: Commercial Managed Care - HMO | Admitting: Physical Therapy

## 2015-01-31 DIAGNOSIS — R262 Difficulty in walking, not elsewhere classified: Secondary | ICD-10-CM

## 2015-01-31 DIAGNOSIS — R29898 Other symptoms and signs involving the musculoskeletal system: Secondary | ICD-10-CM

## 2015-01-31 DIAGNOSIS — Z9181 History of falling: Secondary | ICD-10-CM

## 2015-01-31 NOTE — Therapy (Signed)
[place], [name], [address] Phone: [phone]   Fax:  [phone]  Physical Therapy Treatment  Patient Details  Name: [place] MRN: [id number] Date of Birth: 08-10-1951 Referring Provider:  Charlett Blake, MD  Encounter Date: 01/31/2015      PT End of Session - 01/31/15 1643    Visit Number 6   Number of Visits 16   Date for PT Re-Evaluation 02/07/15   Authorization Type medicare/humana   Authorization - Visit Number 6   Authorization - Number of Visits 16   Equipment Utilized During Treatment Gait belt   Activity Tolerance Patient limited by fatigue;Patient tolerated treatment well   Behavior During Therapy [facility] for tasks assessed/performed      Past Medical History  Diagnosis Date  . Leukocytoclastic vasculitis   . Diabetes mellitus   . Breast cancer     left 2007/right 1997  . Left-sided Breast cancer 07/22/2011  . Cardiomyopathy 07/22/2011  . COPD (chronic obstructive pulmonary disease) 07/22/2011  . Hypertension   . Hyperlipidemia   . 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  Stage II (T2 N0).  ER 73%, PR 90%, Her2 positive, Ki-67 19%.  2.1 cm in size.  Agreed to only have radiation, Herceptin, and Aromasin.  Right sided breast cancer: Stage II (T2 N0 M0).  2.5 cm in size.  Surgery on 04/28/1996 with axillary dissection on 05/10/1996.  11 negative nodes.  Treated with AC x 4 cycles for ER negative, PR positive at only 20%.    . Breast cancer, right breast 12/16/2013    Past Surgical History  Procedure Laterality Date  . Mastectomy, radical      right  . Breast lumpectomy      left  . Port-a-cath removal    . Colonoscopy  07/20/2005    RMR: Diminutive ascending colon polyp, status post resection . Inflamed focally adenomatous polyp  . Colonoscopy N/A 10/05/2013    RMR: Multiple rectal and colonic polyps-removed/treated.  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 other polyps tubular adenomas  . Flexible sigmoidoscopy N/A 10/11/2013    RMR: rectal polypectomy site identified, assitional resection and tatooing performed. 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 Flexible sigmoidoscopy N/A 11/24/2013    Procedure: FLEXIBLE SIGMOIDOSCOPY;  Surgeon: Daneil Dolin, MD;  [facility];  Service: Endoscopy;  Laterality: N/A;  1:00-moved to [place] notified pt    There were no vitals filed for this visit.  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 patient no longer recievign speech or OT. patient notes continued difficulty walking, difficulty focussing and seeing in Rt eye, and patient contineus to have dizziness. Previously patient was indepeendent with all activities f daily living. Rt side feels weak, Lt side of body feels cold and right side of body feels warn, entire face feels a little cold., more sensation on Rt side of body and face than Lt.    Currently in Pain? No/denies  [facility] Adult PT Treatment/Exercise - 01/31/15 0001    Knee/Hip Exercises: Stretches   Active Hamstring Stretch 3 reps;30 seconds   Active Hamstring Stretch Limitations stairs    Hip Flexor Stretch 2 reps;30 seconds   Hip Flexor Stretch Limitations stairs    Piriformis Stretch 2 reps;30 seconds   Piriformis Stretch Limitations seated   Gastroc Stretch 3 reps;30 seconds   Gastroc Stretch Limitations stairs   Knee/Hip Exercises: Aerobic   Stationary Bike Nustep seat 7, hills 3, level 3 for 8 minutes    Knee/Hip  Exercises: Standing   Other Standing Knee Exercises 3D hip excursions 1x15   Other Standing Knee Exercises Gait training with reciprocal movement of UEs/LEs with exaggerated motion; gait with cross-midline reaches and focus on trunk rotation              Balance Exercises - 01/31/15 1053    Balance Exercises: Standing   Cone Rotation Limitations Cones in semi-circle and at different levels of height with cross-midline taps  with LEs            PT Education - 01/31/15 1111    Education provided Yes   Education Details education regarding appropriate BP, possible consult with ENT MD    Person(s) Educated Patient   Methods Explanation   Comprehension Verbalized understanding          PT Short Term Goals - 01/23/15 1025    PT SHORT TERM GOAL #1   Title patient will be able to ambualte 1000 ft during 6 minute walk test with cain indicatign improved gait efficency and    Status On-going   PT SHORT TERM GOAL #2   Title Patient will able to single elg stand > 3 seconds indicating decreased risk of falls.   Status On-going   PT SHORT TERM GOAL #3   Title Patient will be abel to dmeonstrate icnreased Glut medius strength to 4/5 bilaterally.    Status On-going   PT SHORT TERM GOAL #4   Title Patient will be independent with HEP.    Status On-going           PT Long Term Goals - 01/23/15 1025    PT LONG TERM GOAL #1   Title patient will be able to ambualte 1200 ft during 6 minute walk test with cain indicatign improved gait efficency and activity tolerance   PT LONG TERM GOAL #2   Title Patient will able to single leg stand > 10 seconds indicating decreased risk of falls.   PT LONG TERM GOAL #3   Title Berg balance score to improve >52/56 indicating patient not at high risk of falls.    PT LONG TERM GOAL #4   Title patient will dmeonstrate glut max strength of 5/5/ to be able to ambualte up and down stairs withtou UE support.    PT LONG TERM GOAL #5   Title Patient  will verbaly state no fear of falling.                Plan - 01/31/15 1644    Clinical Impression Statement Continued focus on balance and coordination with reciprocal movements and also performed gait based activities to improve overal mobility/coordination and trunk rotation. Fatigued today and required 2 rest breaks in sitting. Patient states that her MD says her hoarseness is likely due to her stroke. Continued with Nustep at end of session. Occasional posterior and lateral LOB during cone coordination/balance work today. At one point during sitting rest patient  did begin to feel woozy; BP measured at 147/93 and symptoms decreased with rest, did not return during remainder of session. Noted hoarseness and some coughing after drinking thin liquids today; consulted Speech Therapist, who states that MD wants to wait a little longer before seeing if patient needs Speech Therapy, and requested that PT monitor patient for abnormal lung /breath sounds during treatments.    Pt will benefit from skilled therapeutic intervention in order to improve on the following deficits Abnormal gait;Decreased endurance;Decreased strength;Impaired flexibility;Decreased activity tolerance;Difficulty walking;Decreased balance;Decreased coordination   Rehab Potential Good   PT Frequency 2x / week   PT Duration 8 weeks   PT Treatment/Interventions Neuromuscular re-education;Gait training;ADLs/Self Care Home Management;Stair training;Patient/family education;Functional mobility training;Therapeutic exercise;Therapeutic activities;Balance training   PT Next Visit Plan Continue to progress UE/LE coordination tasks incorporating balance challenge; reciprocal movement of UEs/LEs and trunk rotation during gait; functional activity tolerance and strength. Monitor for abnormal lung sounds or wheezing/rattling during therapy.   PT Home Exercise Plan 5x sit to stand 3x daily.    Consulted and Agree with Plan of Care Patient         Problem List Patient Active Problem List   Diagnosis Date Noted  . 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 Dysphagia S/P CVA (cerebrovascular accident) 10/10/2014  . Type 2 diabetes mellitus not at goal 10/05/2014  . 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 Lacunar infarction 10/01/2014  . 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 CVA (cerebral infarction) 09/28/2014  . TIA (transient ischemic attack) 09/27/2014  . HTN (hypertension) 09/27/2014  . Breast cancer, right breast 12/16/2013  . 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 Adenomatous polyp 10/02/2013  . 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 Cardiomyopathy 07/22/2011  . COPD (chronic obstructive pulmonary disease) 07/22/2011    Deniece Ree PT, DPT [facility] [address] [place], [name], [address] Phone: [phone]   Fax:  [phone]

## 2015-01-31 NOTE — Therapy (Signed)
[place], [name], [address] Phone: [phone]   Fax:  [phone]  Physical Therapy Treatment  Patient Details  Name: [place] MRN: [id number] Date of Birth: 15-Sep-1950 Referring Provider:  Charlett Blake, MD  Encounter Date: 01/31/2015      PT End of Session - 01/31/15 1111    Visit Number 6   Number of Visits 16   Date for PT Re-Evaluation 02/07/15   Authorization Type medicare/humana   Authorization - Visit Number 6   Authorization - Number of Visits 16   Equipment Utilized During Treatment Gait belt   Activity Tolerance Patient limited by fatigue;Patient tolerated treatment well   Behavior During Therapy [facility] for tasks assessed/performed      Past Medical History  Diagnosis Date  . Leukocytoclastic vasculitis   . Diabetes mellitus   . Breast cancer     left 2007/right 1997  . Left-sided Breast cancer 07/22/2011  . Cardiomyopathy 07/22/2011  . COPD (chronic obstructive pulmonary disease) 07/22/2011  . Hypertension   . Hyperlipidemia   . 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  Stage II (T2 N0).  ER 73%, PR 90%, Her2 positive, Ki-67 19%.  2.1 cm in size.  Agreed to only have radiation, Herceptin, and Aromasin.  Right sided breast cancer: Stage II (T2 N0 M0).  2.5 cm in size.  Surgery on 04/28/1996 with axillary dissection on 05/10/1996.  11 negative nodes.  Treated with AC x 4 cycles for ER negative, PR positive at only 20%.    . Breast cancer, right breast 12/16/2013    Past Surgical History  Procedure Laterality Date  . Mastectomy, radical      right  . Breast lumpectomy      left  . Port-a-cath removal    . Colonoscopy  07/20/2005    RMR: Diminutive ascending colon polyp, status post resection . Inflamed focally adenomatous polyp  . Colonoscopy N/A 10/05/2013    RMR: Multiple rectal and colonic polyps-removed/treated.  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 other polyps tubular adenomas  . Flexible sigmoidoscopy N/A 10/11/2013    RMR: rectal polypectomy site identified, assitional resection and tatooing performed. 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 Flexible sigmoidoscopy N/A 11/24/2013    Procedure: FLEXIBLE SIGMOIDOSCOPY;  Surgeon: Daneil Dolin, MD;  [facility];  Service: Endoscopy;  Laterality: N/A;  1:00-moved to [place] notified pt    There were no vitals filed for this visit.  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 patient no longer recievign speech or OT. patient notes continued difficulty walking, difficulty focussing and seeing in Rt eye, and patient contineus to have dizziness. Previously patient was indepeendent with all activities f daily living. Rt side feels weak, Lt side of body feels cold and right side of body feels warn, entire face feels a little cold., more sensation on Rt side of body and face than Lt.    Currently in Pain? No/denies  [facility] Adult PT Treatment/Exercise - 01/31/15 0001    Knee/Hip Exercises: Stretches   Active Hamstring Stretch 3 reps;30 seconds   Active Hamstring Stretch Limitations stairs    Hip Flexor Stretch 2 reps;30 seconds   Hip Flexor Stretch Limitations stairs    Piriformis Stretch 2 reps;30 seconds   Piriformis Stretch Limitations seated   Gastroc Stretch 3 reps;30 seconds   Gastroc Stretch Limitations stairs   Knee/Hip Exercises: Aerobic   Stationary Bike Nustep seat 7, hills 3, level 3 for 8 minutes    Knee/Hip  Exercises: Standing   Other Standing Knee Exercises 3D hip excursions 1x15   Other Standing Knee Exercises Gait training with reciprocal movement of UEs/LEs with exaggerated motion; gait with cross-midline reaches and focus on trunk rotation              Balance Exercises - 01/31/15 1053    Balance Exercises: Standing   Cone Rotation Limitations Cones in semi-circle and at different levels of height with cross-midline taps  with LEs            PT Education - 01/31/15 1111    Education provided Yes   Education Details education regarding appropriate BP, possible consult with ENT MD    Person(s) Educated Patient   Methods Explanation   Comprehension Verbalized understanding          PT Short Term Goals - 01/23/15 1025    PT SHORT TERM GOAL #1   Title patient will be able to ambualte 1000 ft during 6 minute walk test with cain indicatign improved gait efficency and    Status On-going   PT SHORT TERM GOAL #2   Title Patient will able to single elg stand > 3 seconds indicating decreased risk of falls.   Status On-going   PT SHORT TERM GOAL #3   Title Patient will be abel to dmeonstrate icnreased Glut medius strength to 4/5 bilaterally.    Status On-going   PT SHORT TERM GOAL #4   Title Patient will be independent with HEP.    Status On-going           PT Long Term Goals - 01/23/15 1025    PT LONG TERM GOAL #1   Title patient will be able to ambualte 1200 ft during 6 minute walk test with cain indicatign improved gait efficency and activity tolerance   PT LONG TERM GOAL #2   Title Patient will able to single leg stand > 10 seconds indicating decreased risk of falls.   PT LONG TERM GOAL #3   Title Berg balance score to improve >52/56 indicating patient not at high risk of falls.    PT LONG TERM GOAL #4   Title patient will dmeonstrate glut max strength of 5/5/ to be able to ambualte up and down stairs withtou UE support.    PT LONG TERM GOAL #5   Title Patient  will verbaly state no fear of falling.                Plan - 01/31/15 1112    Clinical Impression Statement Continued focus on balance and coordination with reciprocal movements and also performed gait based activities to improve overal mobility/coordination and trunk rotation. Fatigued today and required 2 rest breaks in sitting. Patient states that her MD says her hoarseness is likely due to her stroke. Continued with Nustep at end of session. Occasional posterior and lateral LOB during cone coordination/balance work today. At one point during sitting rest patient  did begin to feel woozy; BP measured at 147/93 and symptoms decreased with rest, did not return during remainder of session.    Pt will benefit from skilled therapeutic intervention in order to improve on the following deficits Abnormal gait;Decreased endurance;Decreased strength;Impaired flexibility;Decreased activity tolerance;Difficulty walking;Decreased balance;Decreased coordination   Rehab Potential Good   PT Frequency 2x / week   PT Duration 8 weeks   PT Treatment/Interventions Neuromuscular re-education;Gait training;ADLs/Self Care Home Management;Stair training;Patient/family education;Functional mobility training;Therapeutic exercise;Therapeutic activities;Balance training   PT Next Visit Plan Continue to progress UE/LE coordination tasks incorporating balance challenge; reciprocal movement of UEs/LEs and trunk rotation during gait; functional activity tolerance and strength    PT Home Exercise Plan 5x sit to stand 3x daily.    Consulted and Agree with Plan of Care Patient        Problem List Patient Active Problem List   Diagnosis Date Noted  . 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 Dysphagia S/P CVA (cerebrovascular accident) 10/10/2014  . Type 2 diabetes mellitus not at goal 10/05/2014  . 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 Lacunar infarction 10/01/2014  . 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 CVA (cerebral infarction) 09/28/2014  . TIA (transient ischemic attack) 09/27/2014  . HTN (hypertension) 09/27/2014  . Breast cancer, right breast 12/16/2013  . 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 Adenomatous polyp 10/02/2013  . 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 Cardiomyopathy 07/22/2011  . COPD (chronic obstructive pulmonary disease) 07/22/2011    Deniece Ree PT, DPT [facility] [address] [place], [name], [address] Phone: [phone]   Fax:  [phone]

## 2015-02-05 ENCOUNTER — Ambulatory Visit (HOSPITAL_COMMUNITY): Payer: Commercial Managed Care - HMO

## 2015-02-07 ENCOUNTER — Ambulatory Visit (HOSPITAL_COMMUNITY): Payer: Commercial Managed Care - HMO | Admitting: Physical Therapy

## 2015-02-07 DIAGNOSIS — R262 Difficulty in walking, not elsewhere classified: Secondary | ICD-10-CM

## 2015-02-07 DIAGNOSIS — Z9181 History of falling: Secondary | ICD-10-CM

## 2015-02-07 DIAGNOSIS — R29898 Other symptoms and signs involving the musculoskeletal system: Secondary | ICD-10-CM

## 2015-02-07 NOTE — Therapy (Signed)
[place], [name], [address] Phone: [phone]   Fax:  [phone]  Physical Therapy Treatment  Patient Details  Name: Madison Todd MRN: [id number] Date of Birth: 06-Aug-1951 Referring Provider:  Charlett Blake, MD  Encounter Date: 02/07/2015      PT End of Session - 02/07/15 1054    Visit Number 7   Number of Visits 16   Date for PT Re-Evaluation 02/07/15   Authorization Type medicare/humana   Authorization - Visit Number 7   Authorization - Number of Visits 16   PT Start Time 1022   PT Stop Time 1100   PT Time Calculation (min) 38 min   Equipment Utilized During Treatment Gait belt   Activity Tolerance Patient limited by fatigue;Patient tolerated treatment well   Behavior During Therapy [facility] for tasks assessed/performed      Past Medical History  Diagnosis Date  . Leukocytoclastic vasculitis   . Diabetes mellitus   . Breast cancer     left 2007/right 1997  . Left-sided Breast cancer 07/22/2011  . Cardiomyopathy 07/22/2011  . COPD (chronic obstructive pulmonary disease) 07/22/2011  . Hypertension   . Hyperlipidemia   . 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  Stage II (T2 N0).  ER 73%, PR 90%, Her2 positive, Ki-67 19%.  2.1 cm in size.  Agreed to only have radiation, Herceptin, and Aromasin.  Right sided breast cancer: Stage II (T2 N0 M0).  2.5 cm in size.  Surgery on 04/28/1996 with axillary dissection on 05/10/1996.  11 negative nodes.  Treated with AC x 4 cycles for ER negative, PR positive at only 20%.    . Breast cancer, right breast 12/16/2013    Past Surgical History  Procedure Laterality Date  . Mastectomy, radical      right  . Breast lumpectomy      left  . Port-a-cath removal    . Colonoscopy  07/20/2005    RMR: Diminutive ascending colon polyp, status post resection . Inflamed focally adenomatous polyp  . Colonoscopy  N/A 10/05/2013    RMR: Multiple rectal and colonic polyps-removed/treated. 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 other polyps tubular adenomas  . Flexible sigmoidoscopy N/A 10/11/2013    RMR: rectal polypectomy site identified, assitional resection and tatooing performed. 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 Flexible sigmoidoscopy N/A 11/24/2013    Procedure: FLEXIBLE SIGMOIDOSCOPY;  Surgeon: Daneil Dolin, MD;  [facility];  Service: Endoscopy;  Laterality: N/A;  1:00-moved to [place] notified pt    There were no vitals filed for this visit.  Visit Diagnosis:  Weakness of right lower extremity  Risk for falls  Difficulty walking      Subjective Assessment - 02/07/15 1025    Subjective Pt states she is dizzy, but this is normal and no more than usual.  Currently without pain.   Currently in Pain? No/denies                         [facility] Adult PT Treatment/Exercise - 02/07/15 1026    Knee/Hip Exercises: Stretches   Active Hamstring Stretch 3 reps;30 seconds   Active Hamstring Stretch Limitations 12" box   Piriformis Stretch 2 reps;30 seconds   Piriformis Stretch Limitations seated   Gastroc Stretch 3 reps;30 seconds   Gastroc Stretch Limitations slant board  Knee/Hip Exercises: Aerobic   Stationary Bike Nustep seat 7, hills 3, level 3 for 8 minutes    Knee/Hip Exercises: Standing   Heel Raises 15 reps   Heel Raises Limitations toe raises 15x   Functional Squat 10 reps   Rocker Board 2 minutes   Rocker Board Limitations R/L and A/P   Other Standing Knee Exercises 3D hip excursions 1x15   Other Standing Knee Exercises Gait training with reciprocal movement of UEs/LEs with exaggerated motion; gait with cross-midline reaches and focus on trunk rotation                   PT Short Term Goals - 01/23/15 1025    PT SHORT TERM GOAL #1   Title patient will be able to ambualte 1000 ft during 6 minute walk  test with cain indicatign improved gait efficency and    Status On-going   PT SHORT TERM GOAL #2   Title Patient will able to single elg stand > 3 seconds indicating decreased risk of falls.   Status On-going   PT SHORT TERM GOAL #3   Title Patient will be abel to dmeonstrate icnreased Glut medius strength to 4/5 bilaterally.    Status On-going   PT SHORT TERM GOAL #4   Title Patient will be independent with HEP.    Status On-going           PT Long Term Goals - 01/23/15 1025    PT LONG TERM GOAL #1   Title patient will be able to ambualte 1200 ft during 6 minute walk test with cain indicatign improved gait efficency and activity tolerance   PT LONG TERM GOAL #2   Title Patient will able to single leg stand > 10 seconds indicating decreased risk of falls.   PT LONG TERM GOAL #3   Title Berg balance score to improve >52/56 indicating patient not at high risk of falls.    PT LONG TERM GOAL #4   Title patient will dmeonstrate glut max strength of 5/5/ to be able to ambualte up and down stairs withtou UE support.    PT LONG TERM GOAL #5   Title Patient will verbaly state no fear of falling.                Plan - 02/07/15 1054    Clinical Impression Statement Focused again on balance and coordination with reciprocal movements.  Pt with improved stride and UE swing with gait today.  Added squats to POC with therapist facilitation for form.  PT without LOB or worsening dizziness during session today.  Pt with minimal dyspnea as well.     PT Next Visit Plan Continue to progress UE/LE coordination tasks incorporating balance challenge; reciprocal movement of UEs/LEs and trunk rotation during gait; functional activity tolerance and strength. Monitor for abnormal lung sounds or wheezing/rattling during therapy.   Consulted and Agree with Plan of Care Patient        Problem List Patient Active Problem List   Diagnosis Date Noted  . 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 Dysphagia S/P CVA (cerebrovascular accident) 10/10/2014  . Type 2 diabetes mellitus not at goal 10/05/2014  . 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 Lacunar infarction 10/01/2014  . 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 CVA (cerebral infarction) 09/28/2014  . TIA (transient ischemic attack) 09/27/2014  . HTN (hypertension) 09/27/2014  . Breast cancer, right breast 12/16/2013  . Carcinoid tumor of  rectum 11/22/2013  . Adenomatous polyp 10/02/2013  . 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 Cardiomyopathy 07/22/2011  . COPD (chronic obstructive pulmonary disease) 07/22/2011    Teena Irani, PTA/CLT [phone]  02/07/2015, 10:57 AM  [place], [name], [address] Phone: [phone]   Fax:  [phone]

## 2015-02-08 ENCOUNTER — Encounter (HOSPITAL_COMMUNITY): Payer: Commercial Managed Care - HMO | Attending: Hematology & Oncology | Admitting: Hematology & Oncology

## 2015-02-08 ENCOUNTER — Ambulatory Visit (HOSPITAL_COMMUNITY): Payer: Medicare HMO | Admitting: Hematology & Oncology

## 2015-02-08 ENCOUNTER — Encounter (HOSPITAL_COMMUNITY): Payer: Self-pay | Admitting: Hematology & Oncology

## 2015-02-08 VITALS — BP 140/73 | HR 89 | Temp 98.5°F | Resp 18 | Wt 165.7 lb

## 2015-02-08 DIAGNOSIS — Z853 Personal history of malignant neoplasm of breast: Secondary | ICD-10-CM

## 2015-02-08 DIAGNOSIS — Z139 Encounter for screening, unspecified: Secondary | ICD-10-CM

## 2015-02-08 NOTE — Patient Instructions (Signed)
[place] at [facility] Discharge Instructions  RECOMMENDATIONS MADE BY THE CONSULTANT AND ANY TEST RESULTS WILL BE SENT TO YOUR REFERRING PHYSICIAN.  Exam and discussion by Dr. Whitney Muse. Will get mammogram Report any new lumps, bone pain, shortness of breath or other symptoms.  Follow-up in 1 year.  Thank you for choosing [place] at [facility] to provide your oncology and hematology care.  To afford each patient quality time with our provider, please arrive at least 15 minutes before your scheduled appointment time.    You need to re-schedule your appointment should you arrive 10 or more minutes late.  We strive to give you quality time with our providers, and arriving late affects you and other patients whose appointments are after yours.  Also, if you no show three or more times for appointments you may be dismissed from the clinic at the providers discretion.     Again, thank you for choosing [facility].  Our hope is that these requests will decrease the amount of time that you wait before being seen by our physicians.       _____________________________________________________________  Should you have questions after your visit to [facility], please contact our office at [phone] between the hours of 8:30 a.m. and 4:30 p.m.  Voicemails left after 4:30 p.m. will not be returned until the following business day.  For prescription refill requests, have your pharmacy contact our office.

## 2015-02-08 NOTE — Progress Notes (Signed)
Madison Font, MD [address] [place] [address]  Screening - Plan: MM Digital Screening Unilat L CURRENT THERAPY: Surveillance per NCCN guidelines   INTERVAL HISTORY: Madison Todd 64 y.o. female returns for  regular  visit for followup of left-sided breast cancer diagnosed in August 2007, stage II, ER 73%, PR 90%, HER-2 positive, Ki-67 marker 19% (T2 N0) 2.1 cm in size. She refused chemotherapy but agreed to radiation therapy, Herceptin, and Aromasin. She took Aromasin for 5 years. She took Herceptin for a year. AND right sided breast cancer, stage II (T2 N0) 2.5 cm in size with a right mastectomy on 04/28/1996 with axillary dissection on 05/10/1996. 11 nodes were negative. She was treated with AC x4 cycles for ER negative, PR +20%, cancer.   She hasn't had a mammogram since 2014.  She states she meant to get one but hasn't been able to because she has been sick. She was admitted in January after she sustained a CVA. She had an acute lacunar infarct involving the right lateral medullary area and presented with classic lateral medullary syndrome. She has resultant "chronic hoarseness"  She has a cough.  She never had genetics testing for her history of bilateral breast cancer.   Oncology History   Right sided breast cancer, stage II (T2 N0) 2.5 cm in size with a right mastectomy on 04/28/1996 with axillary dissection on 05/10/1996. 11 nodes were negative. She was treated with AC x4 cycles for ER negative, PR +20%, cancer.       Invasive ductal carcinoma of left breast   04/21/2006 Surgery Left breast lumpectomy showing an invasive ductal carcinoma measuring 2.1 cm with DCIS, 0/1 sentinal lymph node, ER 73%, PR 90%, HER 2 positive, Ki-67 19%.   05/31/2006 - 10/20/2006 Chemotherapy Herceptin.  D/C'd on 11/10/2006.  MUGA demonstrated a decrease in EF to 41% with inferior and lateral hypokinesia.   06/01/2006 - 11/10/2006 Chemotherapy Aromasin.  D/C'd on 11/10/2006.  Aromasin  allergy is noted   07/12/2006 - 08/27/2006 Radiation Therapy     Breast cancer, right breast   05/10/1996 Surgery Righ mastectomy, 2.5 cm and axillar node dissection, 0/11 nodes.  ER 0%, PR 20%   07/18/1996 - 09/19/1996 Chemotherapy AC x 4 cycles   10/16/1996 - 09/27/2000 Chemotherapy Tamoxifen.  Approximate end date      Past Medical History  Diagnosis Date   Leukocytoclastic vasculitis    Diabetes mellitus    Breast cancer     left 2007/right 1997   Left-sided Breast cancer 07/22/2011   Cardiomyopathy 07/22/2011   COPD (chronic obstructive pulmonary disease) 07/22/2011   Hypertension    Hyperlipidemia    Invasive ductal carcinoma of left breast 07/22/2011    Left sided breast cancer: Dx in August 2007.  Stage II (T2 N0).  ER 73%, PR 90%, Her2 positive, Ki-67 19%.  2.1 cm in size.  Agreed to only have radiation, Herceptin, and Aromasin.  Right sided breast cancer: Stage II (T2 N0 M0).  2.5 cm in size.  Surgery on 04/28/1996 with axillary dissection on 05/10/1996.  11 negative nodes.  Treated with AC x 4 cycles for ER negative, PR positive at only 20%.     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     is allergic to crestor; lipitor; lisinopril; and aromasin.  Maternal grandmother had breast cancer. Half sister on father's side had breast cancer. Paternal aunt had breast cancer. She has 3 children. 3 boys.  Madison Todd had no medications administered  during this visit.  Past Surgical History  Procedure Laterality Date   Mastectomy, radical      right   Breast lumpectomy      left   Port-a-cath removal     Colonoscopy  07/20/2005    RMR: Diminutive ascending colon polyp, status post resection . Inflamed focally adenomatous polyp   Colonoscopy N/A 10/05/2013    RMR: Multiple rectal and colonic polyps-removed/treated. 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 other polyps tubular adenomas   Flexible sigmoidoscopy N/A 10/11/2013    RMR: rectal polypectomy site identified, assitional resection and tatooing performed. path came back with residual carcinoid   Flexible sigmoidoscopy N/A 11/24/2013    Procedure: FLEXIBLE SIGMOIDOSCOPY;  Surgeon: Daneil Dolin, MD;  Location[facility];  Service: Endoscopy;  Laterality: N/A;  1:00-moved to [place] notified pt    Denies any headaches, dizziness, double vision, fevers, chills, night sweats, nausea, vomiting, diarrhea, constipation, chest pain, heart palpitations, shortness of breath, blood in stool, black tarry stool, urinary pain, urinary burning, urinary frequency, hematuria. 14 point review of systems was performed and is negative except as detailed under history of present illness and above    PHYSICAL EXAMINATION  ECOG PERFORMANCE STATUS: 0 - Asymptomatic  Filed Vitals:   02/08/15 1320  BP: 140/73  Pulse: 89  Temp: 98.5 F (36.9 C)  Resp: 18    GENERAL:alert, healthy, no distress, well nourished, well developed, comfortable, cooperative and smiling SKIN: skin color, texture, turgor are normal, no rashes or significant lesions HEAD: Normocephalic, No masses, lesions, tenderness or abnormalities EYES: normal, PERRLA, EOMI, Conjunctiva are pink and non-injected EARS: External ears normal OROPHARYNX:mucous membranes are moist  NECK: supple, no adenopathy, thyroid normal size, non-tender, without nodularity, no stridor, non-tender, trachea  midline LYMPH:  no palpable lymphadenopathy, no hepatosplenomegaly BREAST:Left breast lumpectomy site in the 1-2 o'clock position, there is retraction and scarring. There is palpable nodularity to the superior portion of the left breast. Tenderness in the left breast.     Right breast post-mastectomy site with dark skin changes that are palpable.     Keloid on right chest wall from port. LUNGS: clear to auscultation  HEART: regular rate & rhythm, no murmurs and no gallops ABDOMEN:abdomen soft, non-tender, normal bowel sounds, no masses or organomegaly and no hepatosplenomegaly BACK: Back symmetric, no curvature., No CVA tenderness EXTREMITIES:less then 2 second capillary refill, no joint deformities, effusion, or inflammation, no edema, no skin discoloration, no clubbing, no cyanosis      Peeling on her hands. NEURO: alert & oriented x 3 with fluent speech, somewhat unsteady gait. Focal weakness of RLE   RADIOGRAPHIC STUDIES:  12/14/2012  *RADIOLOGY REPORT*  Clinical Data: History of right mastectomy. History of left  lumpectomy with radiation treatment in 2007.  DIGITAL DIAGNOSTIC LEFT MAMMOGRAM THE HEAD  Comparison: 08/12/2011 and earlier  Findings:  ACR Breast Density Category 2: There is a scattered fibroglandular  pattern. There are postoperative changes in the lateral portion of  the left breast.  Mammographic images were  processed with CAD.  Physical exam: The patient is concerned regarding a mole in the  lower inner quadrant of the left breast. On physical exam of this  region, there is a 4 mm slightly raised likely pigmented mole  without complicating or suspicious features.  Mammographic images were processed with CAD.  IMPRESSION:  No mammographic evidence for malignancy.  RECOMMENDATION:  Screening mammogram is recommended in one year.  I have discussed the findings and recommendations with the patient.  Results were also provided in writing at the conclusion of the    visit. If applicable, a reminder letter will be sent to the  patient regarding her next appointment.  BI-RADS CATEGORY 2: Benign finding(s).  Original Report Authenticated By: Nolon Nations, M.D.      CLINICAL DATA: Gait imbalance, leaning to the RIGHT side, with RIGHT-sided headache beginning 09/27/2014. Stroke risk factors include hypertension, diabetes, and vasculitis. Also history of breast cancer.  EXAM: MRI HEAD WITHOUT CONTRAST  MRA HEAD WITHOUT CONTRAST IMPRESSION: Acute RIGHT lateral medullary infarct, also with suspected inferior RIGHT lateral cerebellar hemisphere involvement.  Some artifactual signal loss of the distal RIGHT vertebral, with suspected underlying 1 cm long 75% stenosis. The origin of the RIGHT PICA is poorly visualized but is probably diseased as well.  Mild atrophy. Mild small vessel disease. No intracranial mass lesion is evident.   Electronically Signed  By: Rolla Flatten M.D.  On: 09/28/2014 09:10  PATHOLOGY:  11/24/2013  Diagnosis Rectum, biopsy, rectal carcinoid - FOCAL EROSION AND FIBROSIS WITH BIOPSY TATTOO PIGMENT CONSISTENT WITH PREVIOUS BIOPSY. - NO RESIDUAL CARCINOID TUMOR.   ASSESSMENT:  1. Left-sided breast cancer diagnosed in August 2007, stage II, ER 73%, PR 90%, HER-2 positive, Ki-67 marker 19% (T2 N0) 2.1 cm in size. She refused chemotherapy but agreed to radiation therapy, Herceptin, and Aromasin. She is taking Aromasin for 5 years. She took Herceptin for a year. 2. Right sided breast cancer, stage II (T2 N0) 2.5 cm in size with a right mastectomy on 04/28/1996 with axillary dissection on 05/10/1996. 11 nodes were negative. She was treated with AC x4 cycles for ER negative, PR +20%, cancer.  3. Carcinoid tumor of rectum, S/P resection by Dr. Gala Romney on 10/05/2013 with residual tumor at the margins.  Resection occurred on 10/11/2013 with residual tumor.  No residual tumor on 11/24/2013 investigation. 4. 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 I had our PA, Tom review the right chest wall and he states the changes there are chronic. She does have evidence of keloid formation at the site of her prior Port-A-Cath removal. We will continue with ongoing observation with follow-up in one year. She will have a repeat breast exam and  physical exam at that visit.  All questions were answered. The patient knows to call the clinic with any problems, questions or concerns. We can certainly see the patient much sooner if necessary.  This document serves as a record of services personally performed by Ancil Linsey, MD. It was created on her behalf by Arlyce Harman, a trained medical scribe. The creation of this record is based on the scribe's personal observations and the provider's statements to them. This document has been checked and approved by the attending provider.  I have reviewed the above documentation for accuracy and completeness, and I agree with the above.  This note was electronically signed. Molli Hazard MD 02/11/2015

## 2015-02-11 ENCOUNTER — Encounter (HOSPITAL_COMMUNITY): Payer: Self-pay | Admitting: Hematology & Oncology

## 2015-02-12 ENCOUNTER — Ambulatory Visit (HOSPITAL_COMMUNITY): Payer: Commercial Managed Care - HMO | Admitting: Physical Therapy

## 2015-02-12 DIAGNOSIS — R29898 Other symptoms and signs involving the musculoskeletal system: Secondary | ICD-10-CM

## 2015-02-12 DIAGNOSIS — R262 Difficulty in walking, not elsewhere classified: Secondary | ICD-10-CM

## 2015-02-12 DIAGNOSIS — Z9181 History of falling: Secondary | ICD-10-CM

## 2015-02-12 NOTE — Therapy (Signed)
[place], [name], [address] Phone: [phone]   Fax:  [phone]  Physical Therapy Treatment (Re-Assessment)  Patient Details  Name: Madison Todd MRN: [id number] Date of Birth: 1951/08/08 Referring Provider:  Charlett Blake, MD  Encounter Date: 02/12/2015      PT End of Session - 02/12/15 1146    Visit Number 8   Number of Visits 16   Date for PT Re-Evaluation 02/07/15   Authorization Type medicare/humana   Authorization Time Period G-codes done 8th visit    Authorization - Visit Number 8   Authorization - Number of Visits 16   PT Start Time 1025   PT Stop Time 1100   PT Time Calculation (min) 35 min   Activity Tolerance Patient tolerated treatment well;Patient limited by fatigue   Behavior During Therapy [facility] for tasks assessed/performed      Past Medical History  Diagnosis Date  . Leukocytoclastic vasculitis   . Diabetes mellitus   . Breast cancer     left 2007/right 1997  . Left-sided Breast cancer 07/22/2011  . Cardiomyopathy 07/22/2011  . COPD (chronic obstructive pulmonary disease) 07/22/2011  . Hypertension   . Hyperlipidemia   . 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  Stage II (T2 N0).  ER 73%, PR 90%, Her2 positive, Ki-67 19%.  2.1 cm in size.  Agreed to only have radiation, Herceptin, and Aromasin.  Right sided breast cancer: Stage II (T2 N0 M0).  2.5 cm in size.  Surgery on 04/28/1996 with axillary dissection on 05/10/1996.  11 negative nodes.  Treated with AC x 4 cycles for ER negative, PR positive at only 20%.    . Breast cancer, right breast 12/16/2013  . Stroke     Past Surgical History  Procedure Laterality Date  . Mastectomy, radical      right  . Breast lumpectomy      left  . Port-a-cath removal    . Colonoscopy  07/20/2005    RMR: Diminutive ascending colon polyp, status post resection . Inflamed focally  adenomatous polyp  . Colonoscopy N/A 10/05/2013    RMR: Multiple rectal and colonic polyps-removed/treated. 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 other polyps tubular adenomas  . Flexible sigmoidoscopy N/A 10/11/2013    RMR: rectal polypectomy site identified, assitional resection and tatooing performed. 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 Flexible sigmoidoscopy N/A 11/24/2013    Procedure: FLEXIBLE SIGMOIDOSCOPY;  Surgeon: Daneil Dolin, MD;  [facility];  Service: Endoscopy;  Laterality: N/A;  1:00-moved to [place] notified pt    There were no vitals filed for this visit.  Visit Diagnosis:  Weakness of right lower extremity  Risk for falls  Difficulty walking      Subjective Assessment - 02/12/15 1028    Subjective Patient reports she is not having any pain this morning, continue to be dizzy still. Woozy feeling.    Pertinent History patient sustaines a CVA 09/27/14, patient had occupation, speech, and physical therapy in hospital and at home prior to today. patient no longer recievign speech or OT. patient notes continued difficulty walking, difficulty focussing and seeing in Rt eye, and patient contineus to have dizziness. Previously patient was indepeendent with all activities f daily living. Rt side feels weak, Lt side of body feels cold and right side of body feels warn, entire face feels  a little cold., more sensation on Rt side of body and face than Lt.    Patient Stated Goals Patient wants to be able to walk without walker, to be bale to see (likely outside of scope),    Currently in Pain? No/denies            [facility] PT Assessment - 02/12/15 0001    Observation/Other Assessments   Focus on Therapeutic Outcomes (FOTO)  36% limited    Sit to Stand   Comments 5x sit to stand in 11.50 seconds    Other:   Other/ Comments 6 minute walk 1121f    Strength   Right Hip Flexion 4/5   Right Hip Extension 4/5   Right Hip ABduction  4/5   Left Hip Flexion 4+/5   Left Hip Extension 3+/5   Left Hip ABduction 4-/5   Right Knee Flexion 4-/5   Right Knee Extension 4/5   Left Knee Flexion 4/5   Left Knee Extension 4+/5   Right Ankle Dorsiflexion 5/5   Left Ankle Dorsiflexion 5/5   Berg Balance Test   Sit to Stand Able to stand without using hands and stabilize independently   Standing Unsupported Able to stand safely 2 minutes   Sitting with Back Unsupported but Feet Supported on Floor or Stool Able to sit safely and securely 2 minutes   Stand to Sit Sits safely with minimal use of hands   Transfers Able to transfer safely, minor use of hands   Standing Unsupported with Eyes Closed Able to stand 10 seconds safely   Standing Ubsupported with Feet Together Able to place feet together independently and stand for 1 minute with supervision   From Standing, Reach Forward with Outstretched Arm Can reach confidently >25 cm (10")   From Standing Position, Pick up Object from Floor Able to pick up shoe safely and easily   From Standing Position, Turn to Look Behind Over each Shoulder Looks behind one side only/other side shows less weight shift   Turn 360 Degrees Able to turn 360 degrees safely one side only in 4 seconds or less   Standing Unsupported, Alternately Place Feet on Step/Stool Able to stand independently and complete 8 steps >20 seconds   Standing Unsupported, One Foot in Front Able to take small step independently and hold 30 seconds   Standing on One Leg Tries to lift leg/unable to hold 3 seconds but remains standing independently   Total Score 47                             PT Education - 02/12/15 1146    Education provided Yes   Education Details education regarding progress with skilled PT services    Person(s) Educated Patient   Methods Explanation   Comprehension Verbalized understanding          PT Short Term Goals - 01/23/15 1025    PT SHORT TERM GOAL #1   Title patient will be  able to ambualte 1000 ft during 6 minute walk test with cain indicatign improved gait efficency and    Status On-going   PT SHORT TERM GOAL #2   Title Patient will able to single elg stand > 3 seconds indicating decreased risk of falls.   Status On-going   PT SHORT TERM GOAL #3   Title Patient will be abel to dmeonstrate icnreased Glut medius strength to 4/5 bilaterally.    Status On-going   PT  SHORT TERM GOAL #4   Title Patient will be independent with HEP.    Status On-going           PT Long Term Goals - 01/23/15 1025    PT LONG TERM GOAL #1   Title patient will be able to ambualte 1200 ft during 6 minute walk test with cain indicatign improved gait efficency and activity tolerance   PT LONG TERM GOAL #2   Title Patient will able to single leg stand > 10 seconds indicating decreased risk of falls.   PT LONG TERM GOAL #3   Title Berg balance score to improve >52/56 indicating patient not at high risk of falls.    PT LONG TERM GOAL #4   Title patient will dmeonstrate glut max strength of 5/5/ to be able to ambualte up and down stairs withtou UE support.    PT LONG TERM GOAL #5   Title Patient will verbaly state no fear of falling.                Plan - 02-23-15 1147    Clinical Impression Statement Re-assessment performed today; patient arrived late so full re-assess was unable to be performed. Patient shows improvement in functional activity tolerance, functional mobility, coordination, and functional strength as evidenced by improved scores on 6 minute walk test, 5x sit to stand, and frequent ambulation without walker. Patient does continue to state that she is limited by her dizziness at this time and states that she has trouble with functional tasks such as carrying groceries since she becomes greatly imbalanced during this sort of activity. Noted ongoing  tendency to lean/veer to the L during extended ambulation, required cues for safety. Patient states she feels like she  is moving much better at home. She will benefit from ongoing skilled PT services in order to adddress her remaining gait and postural deviations, reduced functional activity tolerance, impaired balance, impaired functional strength and coordination, and reduced safety awareness at approximately 2x/week for 4 more weeks.    Pt will benefit from skilled therapeutic intervention in order to improve on the following deficits Abnormal gait;Decreased endurance;Decreased strength;Impaired flexibility;Decreased activity tolerance;Difficulty walking;Decreased balance;Decreased coordination   Rehab Potential Good   PT Frequency 2x / week   PT Duration 4 weeks   PT Treatment/Interventions Neuromuscular re-education;Gait training;ADLs/Self Care Home Management;Stair training;Patient/family education;Functional mobility training;Therapeutic exercise;Therapeutic activities;Balance training   PT Next Visit Plan Review goals and update HEP. Continue to progress UE/LE coordination tasks incorporating balance challenge; reciprocal movement of UEs/LEs and trunk rotation during gait; functional activity tolerance and strength. Monitor for abnormal lung sounds or wheezing/rattling during therapy.   PT Home Exercise Plan 5x sit to stand 3x daily.    Consulted and Agree with Plan of Care Patient          G-Codes - 23-Feb-2015 1157    Functional Assessment Tool Used FOTO    Functional Limitation Mobility: Walking and moving around   Mobility: Walking and Moving Around Current Status [phone]) At least 20 percent but less than 40 percent impaired, limited or restricted   Mobility: Walking and Moving Around Goal Status [phone]) At least 1 percent but less than 20 percent impaired, limited or restricted      Problem List Patient Active Problem List   Diagnosis Date Noted  . 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 Dysphagia S/P CVA (cerebrovascular accident) 10/10/2014  . Type  2 diabetes mellitus not at goal 10/05/2014  .  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 Lacunar infarction 10/01/2014  . 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 CVA (cerebral infarction) 09/28/2014  . TIA (transient ischemic attack) 09/27/2014  . HTN (hypertension) 09/27/2014  . Breast cancer, right breast 12/16/2013  . 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 Adenomatous polyp 10/02/2013  . 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 Cardiomyopathy 07/22/2011  . COPD (chronic obstructive pulmonary disease) 07/22/2011    Deniece Ree PT, DPT [facility] [address] [place], [name], [address] Phone: [phone]   Fax:  [phone]

## 2015-02-14 ENCOUNTER — Ambulatory Visit (HOSPITAL_COMMUNITY): Payer: Commercial Managed Care - HMO | Attending: Physical Medicine & Rehabilitation

## 2015-02-14 DIAGNOSIS — Z9181 History of falling: Secondary | ICD-10-CM | POA: Diagnosis not present

## 2015-02-14 DIAGNOSIS — R262 Difficulty in walking, not elsewhere classified: Secondary | ICD-10-CM | POA: Insufficient documentation

## 2015-02-14 DIAGNOSIS — R42 Dizziness and giddiness: Secondary | ICD-10-CM | POA: Diagnosis not present

## 2015-02-14 DIAGNOSIS — G463 Brain stem stroke syndrome: Secondary | ICD-10-CM | POA: Diagnosis not present

## 2015-02-14 DIAGNOSIS — R29898 Other symptoms and signs involving the musculoskeletal system: Secondary | ICD-10-CM

## 2015-02-14 DIAGNOSIS — I639 Cerebral infarction, unspecified: Secondary | ICD-10-CM | POA: Insufficient documentation

## 2015-02-14 NOTE — Therapy (Signed)
[place], [name], [address] Phone: [phone]   Fax:  [phone]  Physical Therapy Treatment  Patient Details  Name: Madison Todd MRN: [id number] Date of Birth: [birth year] Referring Provider:  Charlett Blake, MD  Encounter Date: 02/14/2015      PT End of Session - 02/14/15 1023    Visit Number 9   Number of Visits 16   Date for PT Re-Evaluation 03/07/15   Authorization Type medicare/humana   Authorization Time Period G-codes done 8th visit    Authorization - Visit Number 9   Authorization - Number of Visits 16   PT Start Time 1020   PT Stop Time 1100   PT Time Calculation (min) 40 min   Equipment Utilized During Treatment Gait belt   Activity Tolerance Patient tolerated treatment well;Patient limited by fatigue   Behavior During Therapy [facility] for tasks assessed/performed      Past Medical History  Diagnosis Date  . Leukocytoclastic vasculitis   . Diabetes mellitus   . Breast cancer     left 2007/right 1997  . Left-sided Breast cancer 07/22/2011  . Cardiomyopathy 07/22/2011  . COPD (chronic obstructive pulmonary disease) 07/22/2011  . Hypertension   . Hyperlipidemia   . 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  Stage II (T2 N0).  ER 73%, PR 90%, Her2 positive, Ki-67 19%.  2.1 cm in size.  Agreed to only have radiation, Herceptin, and Aromasin.  Right sided breast cancer: Stage II (T2 N0 M0).  2.5 cm in size.  Surgery on 04/28/1996 with axillary dissection on 05/10/1996.  11 negative nodes.  Treated with AC x 4 cycles for ER negative, PR positive at only 20%.    . Breast cancer, right breast 12/16/2013  . Stroke     Past Surgical History  Procedure Laterality Date  . Mastectomy, radical      right  . Breast lumpectomy      left  . Port-a-cath removal    . Colonoscopy  07/20/2005    RMR: Diminutive ascending colon polyp, status post  resection . Inflamed focally adenomatous polyp  . Colonoscopy N/A 10/05/2013    RMR: Multiple rectal and colonic polyps-removed/treated. 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 other polyps tubular adenomas  . Flexible sigmoidoscopy N/A 10/11/2013    RMR: rectal polypectomy site identified, assitional resection and tatooing performed. 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 Flexible sigmoidoscopy N/A 11/24/2013    Procedure: FLEXIBLE SIGMOIDOSCOPY;  Surgeon: Daneil Dolin, MD;  [facility];  Service: Endoscopy;  Laterality: N/A;  1:00-moved to [place] notified pt    There were no vitals filed for this visit.  Visit Diagnosis:  Weakness of right lower extremity  Risk for falls  Difficulty walking      Subjective Assessment - 02/14/15 1022    Subjective Pain free, continues to have dizziness.  Feels her speech and breathing are improving                         [facility] Adult PT Treatment/Exercise - 02/14/15 0001    Knee/Hip Exercises: Stretches   Gastroc Stretch 3 reps;30 seconds   Gastroc Stretch Limitations slant board   Knee/Hip Exercises: Aerobic   Stationary Bike Nustep seat 7, hills 3, level 3 for 10 minutes    Knee/Hip Exercises: Standing  Rocker Board 2 minutes   Rocker Board Limitations R/L and A/P no HHA   Gait Training Reciprocal pre-gait training with focus on reciprocal motion of UEs/LEs, heel-toe stepping, cross-midline reaches with weight shifts;  Standing in place high march with opposite UE movements then gait,   Other Standing Knee Exercises 3D hip excursions 1x15             Balance Exercises - 02/14/15 1102    Balance Exercises: Standing   Tandem Stance Eyes open;3 reps;30 secs   SLS Eyes open;Solid surface;3 reps;Time  Rt" 11" Lt 7" max of 3             PT Short Term Goals - 02/14/15 1035    PT SHORT TERM GOAL #1   Title patient will be able to ambualte 1000 ft during 6 minute walk  test with cain indicatign improved gait efficency and    Baseline 02/12/2015 1125 feet in 6 minutes   Status Achieved   PT SHORT TERM GOAL #2   Title Patient will able to single elg stand > 3 seconds indicating decreased risk of falls.   Baseline 02/14/2015 Rt 11", Lt 7"    Status Achieved   PT SHORT TERM GOAL #3   Title Patient will be abel to dmeonstrate icnreased Glut medius strength to 4/5 bilaterally.    Status Achieved   PT SHORT TERM GOAL #4   Title Patient will be independent with HEP.    Status Not Met           PT Long Term Goals - 02/14/15 1049    PT LONG TERM GOAL #1   Title patient will be able to ambualte 1200 ft during 6 minute walk test with cain indicatign improved gait efficency and activity tolerance   Status On-going   PT LONG TERM GOAL #2   Title Patient will able to single leg stand > 10 seconds indicating decreased risk of falls.   Status On-going   PT LONG TERM GOAL #3   Title Berg balance score to improve >52/56 indicating patient not at high risk of falls.    Status On-going   PT LONG TERM GOAL #4   Title patient will dmeonstrate glut max strength of 5/5/ to be able to ambualte up and down stairs withtou UE support.    Status On-going   PT LONG TERM GOAL #5   Title Patient will verbaly state no fear of falling.    Status On-going               Plan - 02/14/15 1115    Clinical Impression Statement Session focus on improving balance and gait mechanics with cueing to improve reciprocal movements with UE and LE to improve coordination and trunk rotation during gait.  Min assistance required for LOB episodes with NBOS activities this session and cueing to improve spatial awareness to decrease dizziness.  Pt limited by fatige stateing she was tired through session with multiple rest breaks required.  Reviewed goals with 3/4 STGs achieved and progressings towards LTGs.  Pt stated she has not been compliant with HEP, pt educated on importance of  complieteing exercises at home for maximal benefits.  Pt given advanced HEP printout given and explained techniques, pt able to verbalize and demonstrate appropriate techniques with exercises.  No noted abnormal lung siounds of wheezing this session, pt with horse throat.   PT Next Visit Plan Assess compliance with HEP.  Continue to progress UE/LE coordination tasks incorporating balance  challenge; reciprocal movement of UEs/LEs and trunk rotation during gait; functional activity tolerance and strength. Monitor for abnormal lung sounds or wheezing/rattling during therapy.        Problem List Patient Active Problem List   Diagnosis Date Noted  . 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 Dysphagia S/P CVA (cerebrovascular accident) 10/10/2014  . Type 2 diabetes mellitus not at goal 10/05/2014  . 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 Lacunar infarction 10/01/2014  . 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 CVA (cerebral infarction) 09/28/2014  . TIA (transient ischemic attack) 09/27/2014  . HTN (hypertension) 09/27/2014  . Breast cancer, right breast 12/16/2013  . 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 Adenomatous polyp 10/02/2013  . 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 Cardiomyopathy 07/22/2011  . COPD (chronic obstructive pulmonary disease) 07/22/2011   Aldona Lento, PTA   Aldona Lento 02/14/2015, 1:44 PM  [facility] [address] [place], [name], [address] Phone: [phone]   Fax:  [phone]

## 2015-02-14 NOTE — Patient Instructions (Signed)
Tandem Stance   Standing beside counter or solid surface place right foot in front of left, heel touching toe both feet "straight ahead" with hand held assistance for safety. Stand on Foot Triangle of Support with both feet. Balance in this position 30 seconds. Do with left foot in front of right.  Copyright  VHI. All rights reserved.   Single Leg Balance: Eyes Open   Stand on right leg with eyes open near counter or solid surface for safety. Hold as long as you can.  Repeat with opposite leg 3 reps 1-2 times per day.  http://ggbe.exer.us/5   Copyright  VHI. All rights reserved.   FUNCTIONAL MOBILITY: Squat   Stance: shoulder-width on floor. Bend hips and knees. Keep back straight. Do not allow knees to bend past toes. Squeeze glutes and quads to stand. 10-20 reps per set, 1-2 sets per day, 3-5 days per week  Copyright  VHI. All rights reserved.

## 2015-02-19 ENCOUNTER — Ambulatory Visit (HOSPITAL_COMMUNITY): Payer: Commercial Managed Care - HMO

## 2015-02-19 DIAGNOSIS — R262 Difficulty in walking, not elsewhere classified: Secondary | ICD-10-CM | POA: Diagnosis not present

## 2015-02-19 DIAGNOSIS — Z9181 History of falling: Secondary | ICD-10-CM

## 2015-02-19 DIAGNOSIS — R29898 Other symptoms and signs involving the musculoskeletal system: Secondary | ICD-10-CM

## 2015-02-19 NOTE — Therapy (Signed)
[place], [name], [address] Phone: [phone]   Fax:  [phone]  Physical Therapy Treatment  Patient Details  Name: Madison Todd MRN: [id number] Date of Birth: July 15, 1951 Referring Provider:  Charlett Blake, MD  Encounter Date: 02/19/2015      PT End of Session - 02/19/15 1046    Visit Number 10   Number of Visits 16   Date for PT Re-Evaluation 03/07/15   Authorization Type medicare/humana   Authorization Time Period G-codes done 8th visit    Authorization - Visit Number 10   Authorization - Number of Visits 16   PT Start Time 1018   PT Stop Time 1104   PT Time Calculation (min) 46 min   Equipment Utilized During Treatment Gait belt   Activity Tolerance Patient tolerated treatment well;Patient limited by fatigue   Behavior During Therapy [facility] for tasks assessed/performed      Past Medical History  Diagnosis Date  . Leukocytoclastic vasculitis   . Diabetes mellitus   . Breast cancer     left 2007/right 1997  . Left-sided Breast cancer 07/22/2011  . Cardiomyopathy 07/22/2011  . COPD (chronic obstructive pulmonary disease) 07/22/2011  . Hypertension   . Hyperlipidemia   . 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  Stage II (T2 N0).  ER 73%, PR 90%, Her2 positive, Ki-67 19%.  2.1 cm in size.  Agreed to only have radiation, Herceptin, and Aromasin.  Right sided breast cancer: Stage II (T2 N0 M0).  2.5 cm in size.  Surgery on 04/28/1996 with axillary dissection on 05/10/1996.  11 negative nodes.  Treated with AC x 4 cycles for ER negative, PR positive at only 20%.    . Breast cancer, right breast 12/16/2013  . Stroke     Past Surgical History  Procedure Laterality Date  . Mastectomy, radical      right  . Breast lumpectomy      left  . Port-a-cath removal    . Colonoscopy  07/20/2005    RMR: Diminutive ascending colon polyp, status  post resection . Inflamed focally adenomatous polyp  . Colonoscopy N/A 10/05/2013    RMR: Multiple rectal and colonic polyps-removed/treated. 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 other polyps tubular adenomas  . Flexible sigmoidoscopy N/A 10/11/2013    RMR: rectal polypectomy site identified, assitional resection and tatooing performed. 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 Flexible sigmoidoscopy N/A 11/24/2013    Procedure: FLEXIBLE SIGMOIDOSCOPY;  Surgeon: Daneil Dolin, MD;  [facility];  Service: Endoscopy;  Laterality: N/A;  1:00-moved to [place] notified pt    There were no vitals filed for this visit.  Visit Diagnosis:  Weakness of right lower extremity  Risk for falls  Difficulty walking      Subjective Assessment - 02/19/15 1019    Subjective Pain free continues to complain of dizziness, same intensity.  Feels her speech is improving.  Reports speech and breathing are improving.     Currently in Pain? No/denies                         [facility] Adult PT Treatment/Exercise - 02/19/15 0001    Exercises   Exercises Knee/Hip   Knee/Hip Exercises: Aerobic   Stationary Bike Nustep seat 6, hills 4, level 3 for 10 minutes  Knee/Hip Exercises: Standing   Rocker Board 2 minutes   Rocker Board Limitations R/L and A/P no HHA   Gait Training Reciprocal pre-gait training with focus on reciprocal motion of UEs/LEs, heel-toe stepping, cross-midline reaches with weight shifts;  Standing in place high march with opposite UE movements then gait,             Balance Exercises - 02/19/15 1046    Balance Exercises: Standing   Tandem Stance Eyes open;3 reps;30 secs   SLS Eyes open;Solid surface;3 reps;Time  Lt 13", Rt 6"   Tandem Gait 1 rep   Retro Gait 1 rep   Sidestepping 1 rep;Theraband  RED             PT Short Term Goals - 02/19/15 1050    PT SHORT TERM GOAL #1   Title patient will be able to ambualte  1000 ft during 6 minute walk test with cain indicatign improved gait efficency and    Status Achieved   PT SHORT TERM GOAL #2   Title Patient will able to single elg stand > 3 seconds indicating decreased risk of falls.   Status Achieved   PT SHORT TERM GOAL #3   Title Patient will be abel to dmeonstrate icnreased Glut medius strength to 4/5 bilaterally.    Status Achieved   PT SHORT TERM GOAL #4   Title Patient will be independent with HEP.    Status Not Met           PT Long Term Goals - 02/19/15 1050    PT LONG TERM GOAL #1   Title patient will be able to ambualte 1200 ft during 6 minute walk test with cain indicatign improved gait efficency and activity tolerance   Status On-going   PT LONG TERM GOAL #2   Title Patient will able to single leg stand > 10 seconds indicating decreased risk of falls.   Status On-going   PT LONG TERM GOAL #3   Title Berg balance score to improve >52/56 indicating patient not at high risk of falls.    Status On-going   PT LONG TERM GOAL #4   Title patient will dmeonstrate glut max strength of 5/5/ to be able to ambualte up and down stairs withtou UE support.    Status On-going   PT LONG TERM GOAL #5   Title Patient will verbaly state no fear of falling.    Status On-going               Plan - 02/19/15 1051    Clinical Impression Statement Pt continued to be limited by fatigue requesting several seated rest breaks through session.  Improved reciprocal movements with UE and LE with minimal cueing required, continues to stagger during gait due to weak core and hip strengthening and decreased balance overall.  Continues to require min assistance for LOB episodes with NBOS activities.  Session focus on improved gait mechanics sequencing, balance and functional strengthening for increased ease with functional activiites.  No reports of pain through session.     PT Next Visit Plan Assess compliance with HEP.  Continue to progress UE/LE  coordination tasks incorporating balance challenge; reciprocal movement of UEs/LEs and trunk rotation during gait; functional activity tolerance and strength. Monitor for abnormal lung sounds or wheezing/rattling during therapy.        Problem List Patient Active Problem List   Diagnosis Date Noted  . 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  Dysphagia S/P CVA (cerebrovascular accident) 10/10/2014  . Type 2 diabetes mellitus not at goal 10/05/2014  . 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 Lacunar infarction 10/01/2014  . 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 CVA (cerebral infarction) 09/28/2014  . TIA (transient ischemic attack) 09/27/2014  . HTN (hypertension) 09/27/2014  . Breast cancer, right breast 12/16/2013  . 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 Adenomatous polyp 10/02/2013  . 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 Cardiomyopathy 07/22/2011  . COPD (chronic obstructive pulmonary disease) 07/22/2011   Aldona Lento, PTA  Aldona Lento 02/19/2015, 5:38 PM  [place] [address] [place], [name], [address] Phone: [phone]   Fax:  [phone]

## 2015-02-21 ENCOUNTER — Ambulatory Visit (HOSPITAL_COMMUNITY): Payer: Commercial Managed Care - HMO | Admitting: Physical Therapy

## 2015-02-21 DIAGNOSIS — R262 Difficulty in walking, not elsewhere classified: Secondary | ICD-10-CM

## 2015-02-21 DIAGNOSIS — Z9181 History of falling: Secondary | ICD-10-CM

## 2015-02-21 DIAGNOSIS — R29898 Other symptoms and signs involving the musculoskeletal system: Secondary | ICD-10-CM

## 2015-02-21 NOTE — Therapy (Signed)
[place], [name], [address] Phone: [phone]   Fax:  [phone]  Physical Therapy Treatment  Patient Details  Name: Madison Todd MRN: [id number] Date of Birth: Oct 15, 1950 Referring Provider:  Iona Beard, MD  Encounter Date: 02/21/2015      PT End of Session - 02/21/15 1108    Visit Number 11   Number of Visits 16   Date for PT Re-Evaluation 03/07/15   Authorization Type medicare/humana   Authorization Time Period G-codes done 8th visit    Authorization - Visit Number 11   Authorization - Number of Visits 16   PT Start Time 1015   PT Stop Time 1055   PT Time Calculation (min) 40 min   Equipment Utilized During Treatment Gait belt   Activity Tolerance Patient limited by fatigue;Patient tolerated treatment well   Behavior During Therapy [facility] for tasks assessed/performed      Past Medical History  Diagnosis Date  . Leukocytoclastic vasculitis   . Diabetes mellitus   . Breast cancer     left 2007/right 1997  . Left-sided Breast cancer 07/22/2011  . Cardiomyopathy 07/22/2011  . COPD (chronic obstructive pulmonary disease) 07/22/2011  . Hypertension   . Hyperlipidemia   . 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  Stage II (T2 N0).  ER 73%, PR 90%, Her2 positive, Ki-67 19%.  2.1 cm in size.  Agreed to only have radiation, Herceptin, and Aromasin.  Right sided breast cancer: Stage II (T2 N0 M0).  2.5 cm in size.  Surgery on 04/28/1996 with axillary dissection on 05/10/1996.  11 negative nodes.  Treated with AC x 4 cycles for ER negative, PR positive at only 20%.    . Breast cancer, right breast 12/16/2013  . Stroke     Past Surgical History  Procedure Laterality Date  . Mastectomy, radical      right  . Breast lumpectomy      left  . Port-a-cath removal    . Colonoscopy  07/20/2005    RMR: Diminutive ascending colon polyp, status post  resection . Inflamed focally adenomatous polyp  . Colonoscopy N/A 10/05/2013    RMR: Multiple rectal and colonic polyps-removed/treated. 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 other polyps tubular adenomas  . Flexible sigmoidoscopy N/A 10/11/2013    RMR: rectal polypectomy site identified, assitional resection and tatooing performed. 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 Flexible sigmoidoscopy N/A 11/24/2013    Procedure: FLEXIBLE SIGMOIDOSCOPY;  Surgeon: Daneil Dolin, MD;  [facility];  Service: Endoscopy;  Laterality: N/A;  1:00-moved to [place] notified pt    There were no vitals filed for this visit.  Visit Diagnosis:  Weakness of right lower extremity  Risk for falls  Difficulty walking      Subjective Assessment - 02/21/15 1017    Subjective Doing pretty good today, still dizzy though. No falls. Tired today.    Currently in Pain? No/denies                         [facility] Adult PT Treatment/Exercise - 02/21/15 0001    Ambulation/Gait   Ambulation/Gait Yes   Ambulation/Gait Assistance 5: Supervision   Ambulation Distance (Feet) 700 Feet   Assistive device None   Gait Pattern Decreased stance time - right   Ambulation Surface Level  Gait Comments cues for increased stance time on Rt and postural cues as needed.   Posture/Postural Control   Posture Comments occasional cues for erect posture due to sidebend to rt.    Lumbar Exercises: Quadruped   Single Arm Raise Right;Left;5 reps   Single Arm Raises Limitations manual cues for correct technique and stability.   Straight Leg Raise 5 reps;3 seconds  manual cues for correct technique and stability.    Knee/Hip Exercises: Aerobic   Stationary Bike Nustep L 4 X 15mnutes   Knee/Hip Exercises: Standing   Rocker Board 2 minutes   Rocker Board Limitations R/L and A/P no HHA   Gait Training Reciprocal pre-gait training with focus on reciprocal motion of UEs/LEs,     Other Standing Knee Exercises staggered stance weight shifts Rt leg forward                PT Education - 02/21/15 1107    Education provided Yes   Education Details postural education and weight shift to right with ambulation   Person(s) Educated Patient   Methods Explanation;Verbal cues;Tactile cues   Comprehension Verbalized understanding;Need further instruction          PT Short Term Goals - 02/19/15 1050    PT SHORT TERM GOAL #1   Title patient will be able to ambualte 1000 ft during 6 minute walk test with cain indicatign improved gait efficency and    Status Achieved   PT SHORT TERM GOAL #2   Title Patient will able to single elg stand > 3 seconds indicating decreased risk of falls.   Status Achieved   PT SHORT TERM GOAL #3   Title Patient will be abel to dmeonstrate icnreased Glut medius strength to 4/5 bilaterally.    Status Achieved   PT SHORT TERM GOAL #4   Title Patient will be independent with HEP.    Status Not Met           PT Long Term Goals - 02/19/15 1050    PT LONG TERM GOAL #1   Title patient will be able to ambualte 1200 ft during 6 minute walk test with cain indicatign improved gait efficency and activity tolerance   Status On-going   PT LONG TERM GOAL #2   Title Patient will able to single leg stand > 10 seconds indicating decreased risk of falls.   Status On-going   PT LONG TERM GOAL #3   Title Berg balance score to improve >52/56 indicating patient not at high risk of falls.    Status On-going   PT LONG TERM GOAL #4   Title patient will dmeonstrate glut max strength of 5/5/ to be able to ambualte up and down stairs withtou UE support.    Status On-going   PT LONG TERM GOAL #5   Title Patient will verbaly state no fear of falling.    Status On-going               Plan - 02/21/15 1109    Clinical Impression Statement Patient reports feeling tired today, slight limit of session due to patients report of feeling tired. Noted  trunk instability with gait and decreased stance time on Rt. continued with balance and stabilization exercies. Trial of quadraped postion for trunk stabilization exercises for gait. Patient required additional manual support to perform. Continued difficulty with balance activites as well. Patient remains appropriate for skilled PT.    Pt will benefit from skilled therapeutic intervention in order to improve on  the following deficits Abnormal gait;Decreased endurance;Decreased strength;Impaired flexibility;Decreased activity tolerance;Difficulty walking;Decreased balance;Decreased coordination   PT Frequency 2x / week   PT Duration 4 weeks   PT Treatment/Interventions Neuromuscular re-education;Gait training;ADLs/Self Care Home Management;Stair training;Patient/family education;Functional mobility training;Therapeutic exercise;Therapeutic activities;Balance training   PT Next Visit Plan Continue with plan of care with gait, balance and stabilization activites.    PT Home Exercise Plan continue, no modifications made.   Consulted and Agree with Plan of Care Patient        Problem List Patient Active Problem List   Diagnosis Date Noted  . 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 Dysphagia S/P CVA (cerebrovascular accident) 10/10/2014  . Type 2 diabetes mellitus not at goal 10/05/2014  . 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 Lacunar infarction 10/01/2014  . 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 CVA (cerebral infarction) 09/28/2014  . TIA (transient ischemic attack) 09/27/2014  . HTN (hypertension) 09/27/2014  . Breast cancer, right breast 12/16/2013  . 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 Adenomatous polyp 10/02/2013  . 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 Cardiomyopathy 07/22/2011  . COPD (chronic obstructive pulmonary disease) 07/22/2011    Cassell Clement, PT,  CSCS  02/21/2015, 11:15 AM  [place] [address] [place], [name], [address] Phone: [phone]   Fax:  [phone]

## 2015-02-25 ENCOUNTER — Encounter: Payer: Self-pay | Admitting: Physical Medicine & Rehabilitation

## 2015-02-25 ENCOUNTER — Ambulatory Visit (HOSPITAL_COMMUNITY): Payer: Commercial Managed Care - HMO

## 2015-02-25 ENCOUNTER — Encounter: Payer: Commercial Managed Care - HMO | Attending: Physical Medicine & Rehabilitation

## 2015-02-25 ENCOUNTER — Ambulatory Visit (HOSPITAL_BASED_OUTPATIENT_CLINIC_OR_DEPARTMENT_OTHER): Payer: Commercial Managed Care - HMO | Admitting: Physical Medicine & Rehabilitation

## 2015-02-25 VITALS — BP 137/65 | HR 90 | Resp 16

## 2015-02-25 DIAGNOSIS — R270 Ataxia, unspecified: Secondary | ICD-10-CM

## 2015-02-25 DIAGNOSIS — G464 Cerebellar stroke syndrome: Secondary | ICD-10-CM

## 2015-02-25 DIAGNOSIS — H8141 Vertigo of central origin, right ear: Secondary | ICD-10-CM | POA: Diagnosis not present

## 2015-02-25 DIAGNOSIS — I69391 Dysphagia following cerebral infarction: Secondary | ICD-10-CM | POA: Diagnosis not present

## 2015-02-25 DIAGNOSIS — G463 Brain stem stroke syndrome: Secondary | ICD-10-CM

## 2015-02-25 NOTE — Progress Notes (Signed)
Subjective:    Patient ID: Madison Todd, female    DOB: [birth year], 64 y.o.   MRN: [id number] 64 year old right-handed female with history of leukocytoclastic vasculitis, diabetes mellitus, cardiomyopathy, COPD as well as hypertension, who presented on September 27, 2014, with severe headache, blurred vision, right-sided weakness, and difficulty in swallowing.  Initial cranial CT scan negative.  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  MRA of the head with some artifactual signal loss in the mid V4 segment with suspected underlying 1 cm 75% stenosis on the right.  Echocardiogram with ejection fraction of 27%, grade 1 diastolic dysfunction.  Carotid Dopplers, no ICA stenosis HPI CC Right eye pain ,not continuous, skin around around eye feels numb Pain Inventory Average Pain 0 Pain Right Now 0 My pain is sharp and tingling  In the last 24 hours, has pain interfered with the following? General activity 0 Relation with others 0 Enjoyment of life 0 What TIME of day is your pain at its worst? no answer Sleep (in general) NA  Pain is worse with: no pain Pain improves with: no pain Relief from Meds: 0  Mobility use a walker ability to climb steps?  yes do you drive?  no  Function retired  Neuro/Psych numbness tingling dizziness  Prior Studies Any changes since last visit?  no  Physicians involved in your care Any changes since last visit?  no   Family History  Problem Relation Age of Onset  . Diabetes Father   . Cancer Maternal Aunt   . Cancer Paternal Aunt   . Colon cancer Neg Hx    History   Social History  . Marital Status: Legally Separated    Spouse Name: N/A  . Number of Children: 3  . Years of Education: N/A   Occupational History  .     Social History Main Topics  . Smoking status: Current Every Day Smoker -- 0.50 packs/day for 18 years      Types: Cigarettes  . Smokeless tobacco: Never Used     Comment: smokes 12 cigarettes daily  . Alcohol Use: No  . Drug Use: No  . Sexual Activity: No   Other Topics Concern  . None   Social History Narrative   Past Surgical History  Procedure Laterality Date  . Mastectomy, radical      right  . Breast lumpectomy      left  . Port-a-cath removal    . Colonoscopy  07/20/2005    RMR: Diminutive ascending colon polyp, status post resection . Inflamed focally adenomatous polyp  . Colonoscopy N/A 10/05/2013    RMR: Multiple rectal and colonic polyps-removed/treated. 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 other polyps tubular adenomas  . Flexible sigmoidoscopy N/A 10/11/2013    RMR: rectal polypectomy site identified, assitional resection and tatooing performed. 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 Flexible sigmoidoscopy N/A 11/24/2013    Procedure: FLEXIBLE SIGMOIDOSCOPY;  Surgeon: Daneil Dolin, MD;  [facility];  Service: Endoscopy;  Laterality: N/A;  1:00-moved to [place] notified pt   Past Medical History  Diagnosis Date  . Leukocytoclastic vasculitis   . Diabetes mellitus   . Breast cancer     left 2007/right 1997  . Left-sided Breast cancer 07/22/2011  . Cardiomyopathy 07/22/2011  . COPD (chronic obstructive pulmonary disease) 07/22/2011  . Hypertension   . Hyperlipidemia   .  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  Stage II (T2 N0).  ER 73%, PR 90%, Her2 positive, Ki-67 19%.  2.1 cm in size.  Agreed to only have radiation, Herceptin, and Aromasin.  Right sided breast cancer: Stage II (T2 N0 M0).  2.5 cm in size.  Surgery on 04/28/1996 with axillary dissection on 05/10/1996.  11 negative nodes.  Treated with AC x 4 cycles for ER negative, PR positive at only 20%.    . Breast cancer, right breast 12/16/2013  . Stroke    BP 137/65 mmHg  Pulse 90  Resp 16  SpO2  90%  Opioid Risk Score:   Fall Risk Score: Moderate Fall Risk (6-13 points) (previously educated and given handout)`1  Depression screen PHQ 2/9  Depression screen [facility] 2/9 01/22/2015 12/25/2014  Decreased Interest 0 0  Down, Depressed, Hopeless 0 0  PHQ - 2 Score 0 0  Altered sleeping 0 -  Tired, decreased energy 1 -  Change in appetite 0 -  Feeling bad or failure about yourself  0 -  Trouble concentrating 0 -  Moving slowly or fidgety/restless 0 -  Suicidal thoughts 0 -  PHQ-9 Score 1 -     Review of Systems  Constitutional: Positive for unexpected weight change.  Endocrine:       High blood sugar  Neurological: Positive for dizziness and numbness.       Tingling  All other systems reviewed and are negative.      Objective:   Physical Exam  Constitutional: She is oriented to person, place, and time. She appears well-developed and well-nourished.  HENT:  Head: Normocephalic and atraumatic.  Right Ear: External ear normal.  Left Ear: External ear normal.  Eyes: Conjunctivae and EOM are normal. Pupils are equal, round, and reactive to light.  Neurological: She is alert and oriented to person, place, and time.  Psychiatric: She has a normal mood and affect. Her behavior is normal.  Nursing note and vitals reviewed.   Patient with decreased sensation right. Orbital, CN5, hoarseness of voice frail nerve IX and X, Minimal ataxia right finger-nose-finger Gait mildly wide based she is able ambulate without an assistive device with supervision however she is modified independent with a walker      Assessment & Plan:  1. Wallenberg syndrome with ipsilateral ataxia as well as ipsilateral facial sensory deficits and contralateral limb and hemibody sensory deficit. We went over her complaints and discussed how they are part of her stroke syndrome  Discussed with patient prognosis for recovery, should plateau at around 6 months post stroke which would be in July.  Recommend  referral to ENT,  vocal hoarseness is related to her stroke, cranial 9 and 10 involvement with lateral medullary infarct, Patient states that she has too many doctors and would like to hold off on this  Continue outpatient therapy  May return to driving as outlined below Graduated return to driving instructions were provided. It is recommended that the patient first drives with another licensed driver in an empty parking lot. If the patient does well with this, and they can drive on a quiet street with the licensed driver. If the patient does well with this they can drive on a busy street with a licensed driver. If the patient does well with this, the next time out they can go by himself. For the first month after resuming driving, I recommend no nighttime or Interstate  driving.

## 2015-02-25 NOTE — Patient Instructions (Signed)

## 2015-02-26 ENCOUNTER — Ambulatory Visit (HOSPITAL_COMMUNITY): Payer: Commercial Managed Care - HMO

## 2015-02-26 DIAGNOSIS — R29898 Other symptoms and signs involving the musculoskeletal system: Secondary | ICD-10-CM

## 2015-02-26 DIAGNOSIS — R262 Difficulty in walking, not elsewhere classified: Secondary | ICD-10-CM

## 2015-02-26 DIAGNOSIS — Z9181 History of falling: Secondary | ICD-10-CM

## 2015-02-26 NOTE — Therapy (Signed)
[place], [name], [address] Phone: [phone]   Fax:  [phone]  Physical Therapy Treatment  Patient Details  Name: Madison Todd MRN: [id number] Date of Birth: April 24, 1951 Referring Provider:  Iona Beard, MD  Encounter Date: 02/26/2015      PT End of Session - 02/26/15 1413    Visit Number 12   Number of Visits 16   Date for PT Re-Evaluation 03/07/15   Authorization Type medicare/humana   Authorization Time Period G-codes done 8th visit    Authorization - Visit Number 12   Authorization - Number of Visits 16   PT Start Time 1408   PT Stop Time 1438   PT Time Calculation (min) 30 min   Equipment Utilized During Treatment Gait belt   Activity Tolerance Patient limited by fatigue;Patient tolerated treatment well   Behavior During Therapy [facility] for tasks assessed/performed      Past Medical History  Diagnosis Date  . Leukocytoclastic vasculitis   . Diabetes mellitus   . Breast cancer     left 2007/right 1997  . Left-sided Breast cancer 07/22/2011  . Cardiomyopathy 07/22/2011  . COPD (chronic obstructive pulmonary disease) 07/22/2011  . Hypertension   . Hyperlipidemia   . 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  Stage II (T2 N0).  ER 73%, PR 90%, Her2 positive, Ki-67 19%.  2.1 cm in size.  Agreed to only have radiation, Herceptin, and Aromasin.  Right sided breast cancer: Stage II (T2 N0 M0).  2.5 cm in size.  Surgery on 04/28/1996 with axillary dissection on 05/10/1996.  11 negative nodes.  Treated with AC x 4 cycles for ER negative, PR positive at only 20%.    . Breast cancer, right breast 12/16/2013  . Stroke     Past Surgical History  Procedure Laterality Date  . Mastectomy, radical      right  . Breast lumpectomy      left  . Port-a-cath removal    . Colonoscopy  07/20/2005    RMR: Diminutive ascending colon polyp, status post  resection . Inflamed focally adenomatous polyp  . Colonoscopy N/A 10/05/2013    RMR: Multiple rectal and colonic polyps-removed/treated. 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 other polyps tubular adenomas  . Flexible sigmoidoscopy N/A 10/11/2013    RMR: rectal polypectomy site identified, assitional resection and tatooing performed. 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 Flexible sigmoidoscopy N/A 11/24/2013    Procedure: FLEXIBLE SIGMOIDOSCOPY;  Surgeon: Daneil Dolin, MD;  [facility];  Service: Endoscopy;  Laterality: N/A;  1:00-moved to [place] notified pt    There were no vitals filed for this visit.  Visit Diagnosis:  Weakness of right lower extremity  Risk for falls  Difficulty walking      Subjective Assessment - 02/26/15 1405    Subjective Pt staded dizziness continues, reported going to MD yesterday and now allowed to drive with supervision.  Drove to dept today, entered dept with no AD   Currently in Pain? No/denies           [facility] Adult PT Treatment/Exercise - 02/26/15 0001    Lumbar Exercises: Quadruped   Single Arm Raise Right;Left;5 reps   Single Arm Raises Limitations manual cues for correct technique and stability.   Straight Leg Raise 5 reps;3 seconds   Knee/Hip Exercises: Aerobic   Stationary  Bike Nustep L 4 X 20mnutes   Knee/Hip Exercises: Standing   Rocker Board 2 minutes   Rocker Board Limitations R/L and A/P no HHA   Gait Training Reciprocal pre-gait training with focus on reciprocal motion of UEs/LEs,    Other Standing Knee Exercises staggered stance weight shifts Rt leg forward   Other Standing Knee Exercises 3D hip excursion with split stance position             PT Short Term Goals - 02/26/15 1414    PT SHORT TERM GOAL #1   Title patient will be able to ambualte 1000 ft during 6 minute walk test with cain indicatign improved gait efficency and    Status Achieved   PT SHORT TERM GOAL #2    Title Patient will able to single elg stand > 3 seconds indicating decreased risk of falls.   Status Achieved   PT SHORT TERM GOAL #3   Title Patient will be abel to dmeonstrate icnreased Glut medius strength to 4/5 bilaterally.    Status Achieved   PT SHORT TERM GOAL #4   Title Patient will be independent with HEP.    Status On-going           PT Long Term Goals - 02/26/15 1415    PT LONG TERM GOAL #1   Title patient will be able to ambualte 1200 ft during 6 minute walk test with cain indicatign improved gait efficency and activity tolerance   Status On-going   PT LONG TERM GOAL #2   Title Patient will able to single leg stand > 10 seconds indicating decreased risk of falls.   Status On-going   PT LONG TERM GOAL #3   Title Berg balance score to improve >52/56 indicating patient not at high risk of falls.    Status On-going   PT LONG TERM GOAL #4   Title patient will dmeonstrate glut max strength of 5/5/ to be able to ambualte up and down stairs withtou UE support.    Status On-going   PT LONG TERM GOAL #5   Title Patient will verbaly state no fear of falling.    Status On-going               Plan - 02/26/15 1827    Clinical Impression Statement Pt 20 minutes late for apt today, unable to complete full POC today.  Pt improving reciprocal UE and LE pattern but does continue to demonstrate staggered gait mechanics due to impaired balance and weak proximal musculature.  Session focus on improving stabiltiy exercises for gait, functional strengthening and static balance activities.  Therapist facilitation required for proper form and technqiue with most exercises especially the quadruped position exercises.     PT Next Visit Plan Continue with plan of care with gait, balance and stabilization activites.         Problem List Patient Active Problem List   Diagnosis Date Noted  . 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  Dysphagia S/P CVA (cerebrovascular accident) 10/10/2014  . Type 2 diabetes mellitus not at goal 10/05/2014  . 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 Lacunar infarction 10/01/2014  . 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 CVA (cerebral infarction) 09/28/2014  . TIA (transient ischemic attack) 09/27/2014  . HTN (hypertension) 09/27/2014  . Breast cancer, right breast 12/16/2013  . 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 Adenomatous polyp 10/02/2013  . 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 Cardiomyopathy 07/22/2011  . COPD (chronic obstructive pulmonary disease) 07/22/2011   Aldona Lento, PTA  Aldona Lento 02/26/2015, 6:33 PM  [facility] [address] [place], [name], [address] Phone: [phone]   Fax:  [phone]

## 2015-02-28 ENCOUNTER — Ambulatory Visit (HOSPITAL_COMMUNITY)
Admission: RE | Admit: 2015-02-28 | Discharge: 2015-02-28 | Disposition: A | Discharge status: Home / Self Care | Payer: Commercial Managed Care - HMO | Source: Ambulatory Visit | Attending: Hematology & Oncology | Admitting: Hematology & Oncology

## 2015-02-28 DIAGNOSIS — Z1231 Encounter for screening mammogram for malignant neoplasm of breast: Secondary | ICD-10-CM | POA: Diagnosis not present

## 2015-02-28 DIAGNOSIS — Z139 Encounter for screening, unspecified: Secondary | ICD-10-CM

## 2015-03-04 ENCOUNTER — Ambulatory Visit (HOSPITAL_COMMUNITY): Payer: Commercial Managed Care - HMO | Admitting: Physical Therapy

## 2015-03-04 ENCOUNTER — Ambulatory Visit: Payer: Commercial Managed Care - HMO | Admitting: *Deleted

## 2015-03-07 ENCOUNTER — Ambulatory Visit (HOSPITAL_COMMUNITY): Payer: Commercial Managed Care - HMO | Admitting: Physical Therapy

## 2015-03-12 ENCOUNTER — Ambulatory Visit (HOSPITAL_COMMUNITY): Payer: Commercial Managed Care - HMO | Admitting: Physical Therapy

## 2015-03-12 DIAGNOSIS — R262 Difficulty in walking, not elsewhere classified: Secondary | ICD-10-CM

## 2015-03-12 DIAGNOSIS — R29898 Other symptoms and signs involving the musculoskeletal system: Secondary | ICD-10-CM

## 2015-03-12 DIAGNOSIS — Z9181 History of falling: Secondary | ICD-10-CM

## 2015-03-12 NOTE — Therapy (Signed)
[place], [name], [address] Phone: [phone]   Fax:  [phone]  Physical Therapy Re-evaluation  Patient Details  Name: Madison Todd MRN: [id number] Date of Birth: 07-02-1951 Referring Provider:  Charlett Blake, MD  Encounter Date: 03/12/2015      PT End of Session - 03/12/15 1028    Visit Number 13   Number of Visits 16   Date for PT Re-Evaluation 03/07/15   Authorization Type medicare/humana   Authorization Time Period G-codes done Sep 28, 2022 visit    Authorization - Visit Number 13   Authorization - Number of Visits 16   PT Start Time [phone]   PT Stop Time 1015   PT Time Calculation (min) 34 min   Activity Tolerance Patient tolerated treatment well;Patient limited by fatigue   Behavior During Therapy [facility] for tasks assessed/performed      Past Medical History  Diagnosis Date  . Leukocytoclastic vasculitis   . Diabetes mellitus   . Breast cancer     left 2007/right 1997  . Left-sided Breast cancer 07/22/2011  . Cardiomyopathy 07/22/2011  . COPD (chronic obstructive pulmonary disease) 07/22/2011  . Hypertension   . Hyperlipidemia   . 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  Stage II (T2 N0).  ER 73%, PR 90%, Her2 positive, Ki-67 19%.  2.1 cm in size.  Agreed to only have radiation, Herceptin, and Aromasin.  Right sided breast cancer: Stage II (T2 N0 M0).  2.5 cm in size.  Surgery on 04/28/1996 with axillary dissection on 05/10/1996.  11 negative nodes.  Treated with AC x 4 cycles for ER negative, PR positive at only 20%.    . Breast cancer, right breast 12/16/2013  . Stroke     Past Surgical History  Procedure Laterality Date  . Mastectomy, radical      right  . Breast lumpectomy      left  . Port-a-cath removal    . Colonoscopy  07/20/2005    RMR: Diminutive ascending colon polyp, status post resection . Inflamed focally adenomatous  polyp  . Colonoscopy N/A 10/05/2013    RMR: Multiple rectal and colonic polyps-removed/treated. 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 other polyps tubular adenomas  . Flexible sigmoidoscopy N/A 10/11/2013    RMR: rectal polypectomy site identified, assitional resection and tatooing performed. 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 Flexible sigmoidoscopy N/A 11/24/2013    Procedure: FLEXIBLE SIGMOIDOSCOPY;  Surgeon: Daneil Dolin, MD;  [facility];  Service: Endoscopy;  Laterality: N/A;  1:00-moved to [place] notified pt    There were no vitals filed for this visit.  Visit Diagnosis:  Weakness of right lower extremity  Risk for falls  Difficulty walking      Subjective Assessment - 03/12/15 0947    Subjective Pt reports that she feels that she has improved a lot, but she still experiences dizziness. She entered dept with no AD today. Pt's biggest concern is her balance, and she states that she feels that her progress has been somewhat slow in regards to her dizziness and balance problem.  Pt was 10 minutes late to appt today.    Pertinent History patient sustaines a CVA 09/27/14, patient had occupation, speech, and physical therapy in hospital and at home prior to today. patient no longer recievign speech or OT. patient notes continued difficulty walking, difficulty focussing  and seeing in Rt eye, and patient contineus to have dizziness. Previously patient was indepeendent with all activities f daily living. Rt side feels weak, Lt side of body feels cold and right side of body feels warn, entire face feels a little cold., more sensation on Rt side of body and face than Lt.    Currently in Pain? No/denies   Pain Score 0-No pain            [facility] PT Assessment - 03/12/15 0001    Assessment   Medical Diagnosis CVA/Wallenberg's syndrome   Onset Date/Surgical Date 09/27/14   Next MD Visit 03/26/15   Prior Therapy yes   Observation/Other  Assessments   Focus on Therapeutic Outcomes (FOTO)  Score: 60. 40% limitation   Sit to Stand   Comments 5x sit to stand: 13.89 seconds   Other:   Other/ Comments 6 minute walk 1243 ft   Strength   Right Hip Flexion 4/5   Right Hip Extension 4-/5   Right Hip ABduction 4/5   Left Hip Flexion 4+/5   Left Hip Extension 3+/5   Left Hip ABduction 4/5   Right Knee Flexion 4+/5   Right Knee Extension 4+/5   Left Knee Flexion 4+/5   Left Knee Extension 5/5   Right Ankle Dorsiflexion 5/5   Left Ankle Dorsiflexion 5/5   Ambulation/Gait   Ambulation/Gait Yes   Ambulation/Gait Assistance 5: Supervision   Ambulation Distance (Feet) 1243 Feet   Assistive device None   Gait Pattern Decreased stance time - right;Lateral trunk lean to left   Ambulation Surface Level;Indoor   Gait Comments postural cues given as needed   Berg Balance Test   Sit to Stand Able to stand without using hands and stabilize independently   Standing Unsupported Able to stand safely 2 minutes   Sitting with Back Unsupported but Feet Supported on Floor or Stool Able to sit safely and securely 2 minutes   Stand to Sit Sits safely with minimal use of hands   Transfers Able to transfer safely, minor use of hands   Standing Unsupported with Eyes Closed Able to stand 10 seconds safely   Standing Ubsupported with Feet Together Able to place feet together independently and stand for 1 minute with supervision   From Standing, Reach Forward with Outstretched Arm Can reach confidently >25 cm (10")   From Standing Position, Pick up Object from [place] to pick up shoe safely and easily   From Standing Position, Turn to Look Behind Over each Shoulder Looks behind from both sides and weight shifts well   Turn 360 Degrees Able to turn 360 degrees safely in 4 seconds or less   Standing Unsupported, Alternately Place Feet on Step/Stool Able to stand independently and complete 8 steps >20 seconds   Standing Unsupported, One Foot in  Front Able to take small step independently and hold 30 seconds   Standing on One Leg Able to lift leg independently and hold equal to or more than 3 seconds   Total Score 50                     [facility] Adult PT Treatment/Exercise - 03/12/15 0001    Knee/Hip Exercises: Standing   Rocker Board 2 minutes   Rocker Board Limitations R/L                PT Education - 03/12/15 1028    Education provided Yes   Education Details Postural education, reviewed goals  with pt   Person(s) Educated Patient   Methods Explanation   Comprehension Verbalized understanding          PT Short Term Goals - 03/12/15 1036    PT SHORT TERM GOAL #1   Title Patient will be able to ambulate 1000 ft during 6 minute walk test with cain indicatign improved gait efficency and    Baseline 03/12/15 1243 feet in 6 minutes   Time 4   Period Weeks   Status Achieved   PT SHORT TERM GOAL #2   Title Patient will able to single elg stand > 3 seconds indicating decreased risk of falls.   Baseline 02/14/2015 Rt 11", Lt 7"    Time 4   Period Weeks   Status Achieved   PT SHORT TERM GOAL #3   Title Patient will be abel to dmeonstrate icnreased Glut medius strength to 4/5 bilaterally.    Time 4   Period Weeks   Status Achieved   PT SHORT TERM GOAL #4   Title Patient will be independent with HEP.    Status On-going           PT Long Term Goals - 03/12/15 1037    PT LONG TERM GOAL #1   Title patient will be able to ambulate 1200 ft during 6 minute walk test with cain indicating improved gait efficency and activity tolerance   Baseline 03/12/15- 1243 feet   Time 8   Period Weeks   Status Achieved   PT LONG TERM GOAL #2   Title Patient will able to single leg stand > 10 seconds indicating decreased risk of falls.   Time 2   Period Weeks   Status On-going   PT LONG TERM GOAL #3   Title Berg balance score to improve >52/56 indicating patient not at high risk of falls.    Baseline 03/12/15-  50/56   Time 2   Period Weeks   Status On-going   PT LONG TERM GOAL #4   Title patient will dmeonstrate glut max strength of 5/5/ to be able to ambualte up and down stairs withtou UE support.    Time 2   Period Weeks   Status On-going   PT LONG TERM GOAL #5   Title Patient will verbaly state no fear of falling.    Time 2   Period Weeks   Status On-going               Plan - 03/12/15 1029    Clinical Impression Statement Pt 10 minutes late for appt today. She is progressing toward her LTGs, but she continues to demonstrate deficits with SLS and with gait mechanics. During ambulation, pt demonstrates trunk lean to the left, and experienced LOB on 2 occasions in today's treatment that required min A to correct. It is recommended to continue with her next 3 sessions in order to continue addressing balance impairment and gait deficits to decrease risk for falls.    Pt will benefit from skilled therapeutic intervention in order to improve on the following deficits Abnormal gait;Decreased endurance;Decreased strength;Impaired flexibility;Decreased activity tolerance;Difficulty walking;Decreased balance;Decreased coordination   Rehab Potential Good   PT Frequency 2x / week   PT Duration 2 weeks   PT Treatment/Interventions Neuromuscular re-education;Gait training;ADLs/Self Care Home Management;Stair training;Patient/family education;Functional mobility training;Therapeutic exercise;Therapeutic activities;Balance training   PT Next Visit Plan Continue with plan of care with gait and balance training to correct L trunk lean and decrease risk for falls.  Consulted and Agree with Plan of Care Patient          G-Codes - Mar 24, 2015 1039    Functional Assessment Tool Used FOTO    Functional Limitation Mobility: Walking and moving around   Mobility: Walking and Moving Around Current Status [phone]) At least 40 percent but less than 60 percent impaired, limited or restricted   Mobility:  Walking and Moving Around Goal Status [phone]) At least 20 percent but less than 40 percent impaired, limited or restricted      Problem List Patient Active Problem List   Diagnosis Date Noted  . 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 Dysphagia S/P CVA (cerebrovascular accident) 10/10/2014  . Type 2 diabetes mellitus not at goal 10/05/2014  . 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 Lacunar infarction 10/01/2014  . 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 CVA (cerebral infarction) 09/28/2014  . TIA (transient ischemic attack) 09/27/2014  . HTN (hypertension) 09/27/2014  . Breast cancer, right breast 12/16/2013  . 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 Adenomatous polyp 10/02/2013  . 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 Cardiomyopathy 07/22/2011  . COPD (chronic obstructive pulmonary disease) 07/22/2011    Hilma Favors, PT, DPT [phone] 03/24/15, 10:41 AM  [facility] [place], [name], [address] Phone: [phone]   Fax:  [phone]

## 2015-03-13 ENCOUNTER — Other Ambulatory Visit: Payer: Self-pay | Admitting: *Deleted

## 2015-03-14 ENCOUNTER — Encounter (HOSPITAL_COMMUNITY): Payer: Commercial Managed Care - HMO | Admitting: Physical Therapy

## 2015-03-14 NOTE — Patient Outreach (Signed)
[facility]) Care Management   03/13/2015  KRYSTYNA CLECKLEY [birth year] [id number]  LOLETHA BERTINI is an 64 y.o. female  Subjective: Mrs. Saline is a 64 year old female with history of diabetes mellitus, cardiomyopathy, COPD, and hypertension. She presented to the ED in January with severe headache, blurred vision, right-sided weakness, and difficulty in swallowing. She suffered a CVA and was admitted to the hospital from 10/01/14 - 10/23/2014.  Mrs. Sparkman was referred to [facility] for post hospital transition of care involvement and chronic disease management and intervention. I was able to see Mrs. Magnan in the home in April after multiple outreach attempts and then not again until today. I have had difficulty maintaining contact with Mrs. Grell and when I have been able to reach her, she has declined to set up an appointment with me. During our visit today she said she was feeling overwhelmed with so many doctor and therapy appointments but felt she was in a better place and wanted to meet with me at her home at least monthly so we could work on her diabetes management.   Also of concern to Mrs. Landowski is her stroke recovery progress. She says she is glad to have made as much progress as she has thus far but is fearful that she "won't get any better after July because the neurologist told me if I wasn't better by July that was as good as it was going to get." We discussed her ongoing work in physical therapy and her independent exercise at home and how setting small goals might help her continue to make progress beyond July. She plans to discuss this further with her physical therapist and neurologist.    Objective:  BP 118/78 mmHg  Pulse 89  Ht 1.626 m (5\' 4" )  Wt 164 lb (74.39 kg)  BMI 28.14 kg/m2  SpO2 94%  Review of Systems  Constitutional: Negative.   HENT: Positive for sore throat.        Complains of feelings of  "numbness in my throat on the left side"  Eyes:       Patient has mild swelling of upper and lower lid of right eye; not painful; no discharge; no change in vision  Respiratory: Negative.   Cardiovascular: Negative.   Gastrointestinal: Negative.   Genitourinary: Negative.   Musculoskeletal: Negative for falls.  Skin: Negative.   Neurological: Positive for dizziness, tingling and focal weakness.       Weakness on right; hoarseness of voice; facial sensory deficits, specifically feelings of numbness "in left side of my throat"  Endo/Heme/Allergies: Negative.   Psychiatric/Behavioral: Negative for depression, suicidal ideas and memory loss. The patient is not nervous/anxious.     Physical Exam  Constitutional: She is oriented to person, place, and time. Vital signs are normal. She appears well-developed and well-nourished. She is active.  Eyes: Conjunctivae are normal. Right eye exhibits no discharge and no exudate. Left eye exhibits no discharge and no exudate.    Neck: No JVD present.  Cardiovascular: Normal rate, regular rhythm, S1 normal, S2 normal and normal heart sounds.   No murmur heard. Respiratory: Effort normal and breath sounds normal.  GI: Soft. Bowel sounds are normal.  Neurological: She is alert and oriented to person, place, and time. A cranial nerve deficit is present.  Cranial nerve IX and X deficit causing hoarseness  Skin: Skin is warm and dry.  Psychiatric: She has a normal mood and affect. Her speech is normal and  behavior is normal. Judgment and thought content normal. Cognition and memory are normal.    Current Medications:   Current Outpatient Prescriptions  Medication Sig Dispense Refill  . acetaminophen (TYLENOL) 325 MG tablet Take 325 mg by mouth at bedtime as needed (help relax, sleep).     Marland Kitchen amLODipine (NORVASC) 10 MG tablet Take 1 tablet (10 mg total) by mouth daily. 30 tablet 1  . aspirin EC 81 MG tablet Take 81 mg by mouth daily.    . cholecalciferol  (VITAMIN D) 1000 UNITS tablet Take 1,000 Units by mouth daily.    . Linagliptin-Metformin HCl 2.01-999 MG TABS Take 1 tablet by mouth daily.    . metoprolol tartrate (LOPRESSOR) 25 MG tablet Take 0.5 tablets (12.5 mg total) by mouth 2 (two) times daily. 60 tablet 1  . gabapentin (NEURONTIN) 300 MG capsule Take 1 capsule (300 mg total) by mouth at bedtime. (Patient not taking: Reported on 03/13/2015) 30 capsule 1   No current facility-administered medications for this visit.    Functional Status:   In your present state of health, do you have any difficulty performing the following activities: 03/13/2015 03/13/2015  Hearing? N N  Vision? N N  Difficulty concentrating or making decisions? N N  Walking or climbing stairs? Y Y  Dressing or bathing? N N  Doing errands, shopping? Y Y  Conservation officer, nature and eating ? - N  Using the Toilet? - N  In the past six months, have you accidently leaked urine? - N  Do you have problems with loss of bowel control? - N  Managing your Medications? - Y  Managing your Finances? - N  Housekeeping or managing your Housekeeping? - Y    Fall/Depression Screening:    PHQ 2/9 Scores 03/13/2015 01/22/2015 12/25/2014  PHQ - 2 Score 1 0 0  PHQ- 9 Score - 1 -    Assessment:    Chronic Health Condition (CVA) - Mrs. Sermons is recovering well from her CVA; she is participating in PT; she has made noticeable improvement in mobility, strength, and stability of gait just since my last visit with her; she is seeing her providers as scheduled. Mrs. Prazak's weight is up a few pounds from April and she is eating at least 2 meals daily; her nutrition risk score is up to 13 from 9 since April.   Chronic Health Condition (DM) - Mrs. Hampshire has type II diabetes and says she has been making efforts at adherence to prescribed carb modified diet; the biggest change she has made is cutting down from 4 regular 12 oz Cokes a day to 1-2 each day; she admits to enjoying lots of  starchy/root vegetables and lots of fruits but was able to verbalize a better understanding of her prescribed diet and in particular, the need for improved carb modification; She declines referral to the dietician and the [facility] at this time.  Mrs. Seidman is taking Insulin Degludec (pen) 10u QD now at the same time each day. She received her new monitor from [facility] as requested and is checking cbg's regularly now.   We set new goals for this month - please see care plan section below.   Medication Non-adherence - Mrs. Velador has not been taking her Gabapentin as prescribed because she is concerned about it making her dizziness worse. She has central vestibular vertigo and feels dizzy "all the time". She would like to discuss use of Gabapentin further with her providers before resuming this  medication.   I requested transfer of all prescriptions to [facility] in April at Mrs. Pittsley's request. Prescriptions were transferred and [name] is happy with services from her new pharmacy.   Plan:   I will follow up with provider offices regarding request for recommendations on continuation of Gabapentin.   Mrs. Tech will continue medications as prescribed, carb modified diet, daily cbg checks, and will attend scheduled appointments.   I will see Mrs. Pelissier at home next month for routine home visit.   [place] Problem Two        Patient Outreach from 03/13/2015 in [place] Problem Two  Medication [place] for Problem Two  Active   Interventions for Problem Two Long Term Goal   medication review utilzing teachback method,  discussed omission of Gabapentin and patient's concerns about this medication causing more dizziness,  contacted provider about patient's concern   [facility] Long Term Goal (31-90) days  patient will take all medications as prescribed over the next 31 days   [facility]N  Long Term Goal Start Date  03/13/15   [facility] CM Short Term Goal #1 (0-30 days)  over the next 30 days patient will discuss with provider concerns re: Gabapentin side effects   [facility] CM Short Term Goal #1 Start Date  03/13/15   Interventions for Short Term Goal #2   discussed patient concerns about Gabapentin side effects,  contacted providers re: patient concerns    [facility] CM Care Plan Problem Three        Patient Outreach from 03/13/2015 in [place] Problem Three  Diabetes Management Concerns   Care Plan for Problem Three  Active   THN Long Term Goal (31-90) days  Over the next 60 days patient will have improvement in 7, 14, and 30 day cbg averages (compared to this month) and will verbalize understanding of HgA1C and CBG as indicators for changes in management of diabetes self care   [facility] Long Term Goal Start Date  03/13/15   Interventions for Problem Three Long Term Goal  Emmi educational will be provided,  utilizing teachback method, reviewed with patient importance of "knowing her numbers",  reviewed purpose of monitoring cbg's and difference between HgA1C and CBG   [facility] CM Short Term Goal #1 (0-30 days)  Patient will continue daily cbg checks over the next 30 days and will call provider and/or RNCM with any questions about findings or if she has cbg <70 or > 300   [facility]N CM Short Term Goal #1 Start Date  03/13/15   Interventions for Short Term Goal #1  utilizing teachback method, reviewed importance of daily cbg monitoring and HgA1C checks as ordered by provider,  reviewed necessity of contacting provider for cbg's < 70 or > 300   [facility] CM Short Term Goal #2 (0-30 days)  Over the next 30 days, patient will verbalize signs and symptoms of hypoglycemia and hyperglycemia   [facility] CM Short Term Goal #2 Start Date  03/13/15   Interventions for Short Term Goal #2  utilizing teachback method, reviewed signs and symptoms of hypoglycemia and hyperglycemia with patient   [facility]N CM Short Term Goal  #3 (0-30 days)  Over the next 30 days, patient will monitor carb intake, noting frequency and amount of starchy carbohydrate intake at each meal   THN  CM Short Term Goal #3 Start Date  03/13/15   Interventions for Short  Term Goal #3  utilizing teachback method, reviewed details of carb modifiied diet with patient       [place] [facility]  [phone]

## 2015-03-19 ENCOUNTER — Ambulatory Visit (HOSPITAL_COMMUNITY): Payer: Commercial Managed Care - HMO | Attending: Physical Medicine & Rehabilitation | Admitting: Physical Therapy

## 2015-03-19 DIAGNOSIS — R29898 Other symptoms and signs involving the musculoskeletal system: Secondary | ICD-10-CM | POA: Diagnosis present

## 2015-03-19 DIAGNOSIS — Z9181 History of falling: Secondary | ICD-10-CM | POA: Diagnosis present

## 2015-03-19 DIAGNOSIS — R262 Difficulty in walking, not elsewhere classified: Secondary | ICD-10-CM | POA: Diagnosis present

## 2015-03-19 NOTE — Therapy (Signed)
[facility] [address] [place], [place], [address] Phone: [phone]   Fax:  [phone]  Physical Therapy Treatment  Patient Details  Name: Madison Todd MRN: [id number] Date of Birth: 05/22/1951 Referring Provider:  Kirsteins, Andrew E, MD  Encounter Date: 03/19/2015      PT End of Session - 03/19/15 1023    Visit Number 14   Number of Visits 16   Date for PT Re-Evaluation 04/11/15   Authorization Type medicare/humana   Authorization Time Period G-codes done 13th visit    Authorization - Visit Number 14   Authorization - Number of Visits 16   PT Start Time 0945   PT Stop Time 1020   PT Time Calculation (min) 35 min   Equipment Utilized During Treatment Gait belt   Activity Tolerance Patient tolerated treatment well;Patient limited by fatigue   Behavior During Therapy WFL for tasks assessed/performed      Past Medical History  Diagnosis Date  . Leukocytoclastic vasculitis   . Diabetes mellitus   . Breast cancer     left 2007/right 1997  . Left-sided Breast cancer 07/22/2011  . Cardiomyopathy 07/22/2011  . COPD (chronic obstructive pulmonary disease) 07/22/2011  . Hypertension   . Hyperlipidemia   . 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  Stage II (T2 N0).  ER 73%, PR 90%, Her2 positive, Ki-67 19%.  2.1 cm in size.  Agreed to only have radiation, Herceptin, and Aromasin.  Right sided breast cancer: Stage II (T2 N0 M0).  2.5 cm in size.  Surgery on 04/28/1996 with axillary dissection on 05/10/1996.  11 negative nodes.  Treated with AC x 4 cycles for ER negative, PR positive at only 20%.    . Breast cancer, right breast 12/16/2013  . Stroke     Past Surgical History  Procedure Laterality Date  . Mastectomy, radical      right  . Breast lumpectomy      left  . Port-a-cath removal    . Colonoscopy  07/20/2005    RMR: Diminutive ascending colon polyp, status  post resection . Inflamed focally adenomatous polyp  . Colonoscopy N/A 10/05/2013    RMR: Multiple rectal and colonic polyps-removed/treated. 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 other polyps tubular adenomas  . Flexible sigmoidoscopy N/A 10/11/2013    RMR: rectal polypectomy site identified, assitional resection and tatooing performed. 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 Flexible sigmoidoscopy N/A 11/24/2013    Procedure: FLEXIBLE SIGMOIDOSCOPY;  Surgeon: Robert M Rourk, MD;  Location: [facility];  Service: Endoscopy;  Laterality: N/A;  1:00-moved to [address] notified pt    There were no vitals filed for this visit.  Visit Diagnosis:  Weakness of right lower extremity  Risk for falls  Difficulty walking      Subjective Assessment - 03/19/15 0949    Subjective Pt denies any pain today. She feels that her balance is improving slowly.    Currently in Pain? No/denies            [facility] Adult PT Treatment/Exercise - 03/19/15 0001    Lumbar Exercises: Quadruped   Single Arm Raise Right;Left;10 reps   Single Arm Raises Limitations manual cues for correct technique and stability.   Straight Leg Raise 10 reps   Opposite Arm/Leg Raise 5 reps;Right arm/Left leg;Left arm/Right leg   Opposite Arm/Leg Raise Limitations manual cues   given for proper technique   Knee/Hip Exercises: Aerobic   Stationary Bike Nustep L 4 X 10minutes   Knee/Hip Exercises: Standing   Forward Step Up Limitations tap ups at 6 inch box 2x10 bilaterally   Rocker Board 2 minutes   Rocker Board Limitations R/L and A/P no HHA   Other Standing Knee Exercises Marching in place on foam x 20                PT Education - 03/19/15 1023    Education provided No          PT Short Term Goals - 03/12/15 1036    PT SHORT TERM GOAL #1   Title Patient will be able to ambulate 1000 ft during 6 minute walk test with cain indicatign improved gait efficency and    Baseline  03/12/15 1243 feet in 6 minutes   Time 4   Period Weeks   Status Achieved   PT SHORT TERM GOAL #2   Title Patient will able to single elg stand > 3 seconds indicating decreased risk of falls.   Baseline 02/14/2015 Rt 11", Lt 7"    Time 4   Period Weeks   Status Achieved   PT SHORT TERM GOAL #3   Title Patient will be abel to dmeonstrate icnreased Glut medius strength to 4/5 bilaterally.    Time 4   Period Weeks   Status Achieved   PT SHORT TERM GOAL #4   Title Patient will be independent with HEP.    Status On-going           PT Long Term Goals - 03/12/15 1037    PT LONG TERM GOAL #1   Title patient will be able to ambulate 1200 ft during 6 minute walk test with cain indicating improved gait efficency and activity tolerance   Baseline 03/12/15- 1243 feet   Time 8   Period Weeks   Status Achieved   PT LONG TERM GOAL #2   Title Patient will able to single leg stand > 10 seconds indicating decreased risk of falls.   Time 2   Period Weeks   Status On-going   PT LONG TERM GOAL #3   Title Berg balance score to improve >52/56 indicating patient not at high risk of falls.    Baseline 03/12/15- 50/56   Time 2   Period Weeks   Status On-going   PT LONG TERM GOAL #4   Title patient will dmeonstrate glut max strength of 5/5/ to be able to ambualte up and down stairs withtou UE support.    Time 2   Period Weeks   Status On-going   PT LONG TERM GOAL #5   Title Patient will verbaly state no fear of falling.    Time 2   Period Weeks   Status On-going               Plan - 03/19/15 1025    Clinical Impression Statement Pt 15 minutes late for appointment today. Treatment focused on balance training and reciprocal motion to improve her gait mechanics. Pt lacked motivation in today's treatment and requires frequent rest breaks.   PT Next Visit Plan Continue with plan of care with gait and balance training to correct L trunk lean and decrease risk for falls.          Problem List Patient Active Problem List   Diagnosis Date Noted  . 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   Dysphagia S/P CVA (cerebrovascular accident) 10/10/2014  . Type 2 diabetes mellitus not at goal 10/05/2014  . 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 Lacunar infarction 10/01/2014  . 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 CVA (cerebral infarction) 09/28/2014  . TIA (transient ischemic attack) 09/27/2014  . HTN (hypertension) 09/27/2014  . Breast cancer, right breast 12/16/2013  . 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 Adenomatous polyp 10/02/2013  . 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 Cardiomyopathy 07/22/2011  . COPD (chronic obstructive pulmonary disease) 07/22/2011    Hilma Favors, PT, DPT [phone] 03/19/2015, 11:13 AM  [place], [name], [address] Phone: [phone]   Fax:  [phone]

## 2015-03-21 ENCOUNTER — Ambulatory Visit (HOSPITAL_COMMUNITY): Payer: Commercial Managed Care - HMO | Admitting: Physical Therapy

## 2015-03-26 ENCOUNTER — Encounter: Payer: Self-pay | Admitting: Physical Medicine & Rehabilitation

## 2015-03-26 ENCOUNTER — Encounter: Payer: Commercial Managed Care - HMO | Attending: Physical Medicine & Rehabilitation

## 2015-03-26 ENCOUNTER — Ambulatory Visit (HOSPITAL_BASED_OUTPATIENT_CLINIC_OR_DEPARTMENT_OTHER): Payer: Commercial Managed Care - HMO | Admitting: Physical Medicine & Rehabilitation

## 2015-03-26 VITALS — BP 142/84 | HR 79 | Resp 14

## 2015-03-26 DIAGNOSIS — R270 Ataxia, unspecified: Secondary | ICD-10-CM | POA: Insufficient documentation

## 2015-03-26 DIAGNOSIS — G464 Cerebellar stroke syndrome: Secondary | ICD-10-CM

## 2015-03-26 DIAGNOSIS — G463 Brain stem stroke syndrome: Principal | ICD-10-CM

## 2015-03-26 DIAGNOSIS — R490 Dysphonia: Secondary | ICD-10-CM | POA: Diagnosis not present

## 2015-03-26 DIAGNOSIS — I69391 Dysphagia following cerebral infarction: Secondary | ICD-10-CM | POA: Diagnosis not present

## 2015-03-26 DIAGNOSIS — H8141 Vertigo of central origin, right ear: Secondary | ICD-10-CM | POA: Diagnosis not present

## 2015-03-26 MED ORDER — GABAPENTIN 100 MG PO CAPS: 100.0000 mg | ORAL_CAPSULE | Freq: Two times a day (BID) | ORAL | Status: DC

## 2015-03-26 NOTE — Patient Instructions (Addendum)
Ok to see dentist  See ENT for vocal hoarseness

## 2015-03-26 NOTE — Patient Outreach (Signed)
[place] [facility] [place] [facility]) Care Management  03/26/2015  Madison Todd 08-02-1951 [id number]   Referral received from MD, notified [name], RN.  [name]. [name], [place] [facility] [place] Assistant Phone: [phone] Fax: [phone]

## 2015-03-26 NOTE — Progress Notes (Signed)
Subjective:    Patient ID: Madison Todd, female    DOB: 1951-01-17, 64 y.o.   MRN: [id number] 64 year old right-handed female with history of leukocytoclastic vasculitis, diabetes mellitus, cardiomyopathy, COPD as well as hypertension, who presented on September 27, 2014, with severe headache, blurred vision, right-sided weakness, and difficulty in swallowing.  Initial cranial CT scan negative.  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  MRA of the head with some artifactual signal loss in the mid V4 segment with suspected underlying 1 cm 75% stenosis on the right.  Echocardiogram with ejection fraction of 74%, grade 1 diastolic dysfunction.  Carotid Dopplers, no ICA stenosis HPI occ Right eye pain , no visual deficits Not smoking since CVA denies ETOH Ambulating with a walker Independent with self-care and mobility Tried driving once with her son and he kept on telling her what to do, she has not driven since.  Pain Inventory Average Pain 0 Pain Right Now 0 My pain is tingling and cold and hot  In the last 24 hours, has pain interfered with the following? General activity 0 Relation with others 0 Enjoyment of life 0 What TIME of day is your pain at its worst? no pain Sleep (in general) Poor  Pain is worse with: no pain Pain improves with: no pain Relief from Meds: no pain  Mobility walk with assistance use a walker ability to climb steps?  yes do you drive?  yes transfers alone Do you have any goals in this area?  yes  Function not employed: date last employed . I need assistance with the following:  household duties  Neuro/Psych bladder control problems weakness numbness tingling trouble walking dizziness depression anxiety  Prior Studies Any changes since last visit?  no  Physicians involved in your care Any changes since last visit?   no   Family History  Problem Relation Age of Onset  . Diabetes Father   . Cancer Maternal Aunt   . Cancer Paternal Aunt   . Colon cancer Neg Hx    History   Social History  . Marital Status: Legally Separated    Spouse Name: N/A  . Number of Children: 3  . Years of Education: N/A   Occupational History  .     Social History Main Topics  . Smoking status: Current Every Day Smoker -- 0.50 packs/day for 18 years    Types: Cigarettes  . Smokeless tobacco: Never Used     Comment: smokes 12 cigarettes daily  . Alcohol Use: No  . Drug Use: No  . Sexual Activity: No   Other Topics Concern  . None   Social History Narrative   Past Surgical History  Procedure Laterality Date  . Mastectomy, radical      right  . Breast lumpectomy      left  . Port-a-cath removal    . Colonoscopy  07/20/2005    RMR: Diminutive ascending colon polyp, status post resection . Inflamed focally adenomatous polyp  . Colonoscopy N/A 10/05/2013    RMR: Multiple rectal and colonic polyps-removed/treated. 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 other polyps tubular adenomas  . Flexible sigmoidoscopy N/A 10/11/2013    RMR: rectal polypectomy site identified, assitional resection and tatooing performed. 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 Flexible sigmoidoscopy N/A 11/24/2013    Procedure: FLEXIBLE SIGMOIDOSCOPY;  Surgeon: Daneil Dolin, MD;  [facility];  Service: Endoscopy;  Laterality: N/A;  1:00-moved to [place] notified pt   Past Medical History  Diagnosis Date  . Leukocytoclastic vasculitis   . Diabetes mellitus   . Breast cancer     left 2007/right 1997  . Left-sided Breast cancer 07/22/2011  . Cardiomyopathy 07/22/2011  . COPD (chronic obstructive pulmonary disease) 07/22/2011  . Hypertension   . Hyperlipidemia   . 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  Stage II (T2 N0).  ER 73%, PR  90%, Her2 positive, Ki-67 19%.  2.1 cm in size.  Agreed to only have radiation, Herceptin, and Aromasin.  Right sided breast cancer: Stage II (T2 N0 M0).  2.5 cm in size.  Surgery on 04/28/1996 with axillary dissection on 05/10/1996.  11 negative nodes.  Treated with AC x 4 cycles for ER negative, PR positive at only 20%.    . Breast cancer, right breast 12/16/2013  . Stroke    BP 142/84 mmHg  Pulse 79  Resp 14  SpO2 91%  Opioid Risk Score:   Fall Risk Score: Moderate Fall Risk (6-13 points)`1  Depression screen PHQ 2/9  Depression screen [facility] 2/9 03/13/2015 01/22/2015 12/25/2014  Decreased Interest 0 0 0  Down, Depressed, Hopeless 1 0 0  PHQ - 2 Score 1 0 0  Altered sleeping - 0 -  Tired, decreased energy - 1 -  Change in appetite - 0 -  Feeling bad or failure about yourself  - 0 -  Trouble concentrating - 0 -  Moving slowly or fidgety/restless - 0 -  Suicidal thoughts - 0 -  PHQ-9 Score - 1 -     Review of Systems  Constitutional: Positive for chills.       Hot flash Night sweats  Respiratory: Positive for shortness of breath.   Endocrine:       High blood sugar  Neurological: Positive for dizziness, weakness and numbness.       Tingling  Psychiatric/Behavioral: Positive for dysphoric mood. The patient is nervous/anxious.   All other systems reviewed and are negative.      Objective:   Physical Exam     Subjective:    Patient ID: Madison Todd, female    DOB: September 10, 1951, 64 y.o.   MRN: [id number] Pain Inventory Average Pain 0 Pain Right Now 0 My pain is sharp and tingling  In the last 24 hours, has pain interfered with the following? General activity 0 Relation with others 0 Enjoyment of life 0 What TIME of day is your pain at its worst? no answer Sleep (in general) NA  Pain is worse with: no pain Pain improves with: no pain Relief from Meds: 0  Mobility use a walker ability to climb steps?  yes do you drive?   no  Function retired  Neuro/Psych numbness tingling dizziness  Prior Studies Any changes since last visit?  no  Physicians involved in your care Any changes since last visit?  no   Family History  Problem Relation Age of Onset  . Diabetes Father   . Cancer Maternal Aunt   . Cancer Paternal Aunt   . Colon cancer Neg Hx    History   Social History  . Marital Status: Legally Separated    Spouse Name: N/A  . Number of Children: 3  . Years of Education: N/A   Occupational History  .     Social History Main Topics  .  Smoking status: Current Every Day Smoker -- 0.50 packs/day for 18 years    Types: Cigarettes  . Smokeless tobacco: Never Used     Comment: smokes 12 cigarettes daily  . Alcohol Use: No  . Drug Use: No  . Sexual Activity: No   Other Topics Concern  . None   Social History Narrative   Past Surgical History  Procedure Laterality Date  . Mastectomy, radical      right  . Breast lumpectomy      left  . Port-a-cath removal    . Colonoscopy  07/20/2005    RMR: Diminutive ascending colon polyp, status post resection . Inflamed focally adenomatous polyp  . Colonoscopy N/A 10/05/2013    RMR: Multiple rectal and colonic polyps-removed/treated. 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 other polyps tubular adenomas  . Flexible sigmoidoscopy N/A 10/11/2013    RMR: rectal polypectomy site identified, assitional resection and tatooing performed. 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 Flexible sigmoidoscopy N/A 11/24/2013    Procedure: FLEXIBLE SIGMOIDOSCOPY;  Surgeon: Daneil Dolin, MD;  Location[facility];  Service: Endoscopy;  Laterality: N/A;  1:00-moved to [place] notified pt   Past Medical History  Diagnosis Date  . Leukocytoclastic vasculitis   . Diabetes mellitus   . Breast cancer     left 2007/right 1997  . Left-sided Breast cancer 07/22/2011  . Cardiomyopathy 07/22/2011  . COPD (chronic obstructive pulmonary  disease) 07/22/2011  . Hypertension   . Hyperlipidemia   . 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  Stage II (T2 N0).  ER 73%, PR 90%, Her2 positive, Ki-67 19%.  2.1 cm in size.  Agreed to only have radiation, Herceptin, and Aromasin.  Right sided breast cancer: Stage II (T2 N0 M0).  2.5 cm in size.  Surgery on 04/28/1996 with axillary dissection on 05/10/1996.  11 negative nodes.  Treated with AC x 4 cycles for ER negative, PR positive at only 20%.    . Breast cancer, right breast 12/16/2013  . Stroke    BP 142/84 mmHg  Pulse 79  Resp 14  SpO2 91%  Opioid Risk Score:   Fall Risk Score: Moderate Fall Risk (6-13 points)`1  Depression screen PHQ 2/9  Depression screen [facility] 2/9 03/13/2015 01/22/2015 12/25/2014  Decreased Interest 0 0 0  Down, Depressed, Hopeless 1 0 0  PHQ - 2 Score 1 0 0  Altered sleeping - 0 -  Tired, decreased energy - 1 -  Change in appetite - 0 -  Feeling bad or failure about yourself  - 0 -  Trouble concentrating - 0 -  Moving slowly or fidgety/restless - 0 -  Suicidal thoughts - 0 -  PHQ-9 Score - 1 -     Review of Systems  Constitutional: Positive for unexpected weight change.  Endocrine:       High blood sugar  Neurological: Positive for dizziness and numbness.       Tingling  All other systems reviewed and are negative.      Objective:   Physical Exam  Constitutional: She is oriented to person, place, and time. She appears well-developed and well-nourished.  HENT:  Head: Normocephalic and atraumatic.  Right Ear: External ear normal.  Left Ear: External ear normal.  Eyes: Conjunctivae and EOM are normal. Pupils are equal, round, and reactive to light. No evidence of nystagmus Neurological: She is alert and  oriented to person, place, and time.  Psychiatric: She has a normal mood and affect. Her behavior is normal.  Nursing note and vitals reviewed.   Patient with decreased sensation right.  Orbital, CN5, hoarseness of voice nerve IX and X, Minimal ataxia right finger-nose-finger Gait mildly wide based she is able ambulate without an assistive device with supervision however she is modified independent with a walker Decreased sensation to pinprick in the right V1 and V2 area, Decreased sensation to pinprick left upper and left lower extremity     Assessment & Plan:       Assessment & Plan:  1. Wallenberg syndrome with ipsilateral ataxia as well as ipsilateral facial sensory deficits and contralateral limb and hemibody sensory deficit. We went over her complaints and discussed how they are part of her stroke syndrome Still has some mobility deficits has a PT visit tomorrow however she is likely starting to plateau in terms of her function  Return to clinic in 2 months  2. Hoarseness this is most likely related to her stroke as well  nerves IX and X are typically involved with a Wallenberg syndrome  Recommend referral to ENT,  vocal hoarseness is related to her stroke, cranial 9 and 10 involvement with lateral medullary infarct, Patient Now is willing to see ear nose and throat specialist  3. Facial dysesthetic pain related to Wallenberg syndrome, did not tolerate gabapentin 300 mg, will restart at 100 mg twice a day.

## 2015-03-27 ENCOUNTER — Ambulatory Visit (HOSPITAL_COMMUNITY): Payer: Commercial Managed Care - HMO | Admitting: Physical Therapy

## 2015-04-16 ENCOUNTER — Encounter: Payer: Self-pay | Admitting: *Deleted

## 2015-04-16 NOTE — Progress Notes (Signed)
This encounter was created in error - please disregard.

## 2015-04-18 ENCOUNTER — Other Ambulatory Visit: Payer: Self-pay | Admitting: *Deleted

## 2015-04-18 NOTE — Patient Outreach (Addendum)
[facility] [place] [facility]) Care Management   04/18/2015  [name] June 05, 1951 [id number]  Madison Todd is an 64 y.o. female  Subjective: Madison Todd is a 64 year old female with history of diabetes mellitus, cardiomyopathy, COPD, and hypertension. She presented to the ED in January with severe headache, blurred vision, right-sided weakness, and difficulty in swallowing. She suffered a CVA and was admitted to the hospital from 10/01/14 - 10/23/2014.  Madison Todd was referred to [facility] for post hospital transition of care involvement and chronic disease management and intervention. I was able to see Madison Todd in the home in April after multiple outreach attempts and then not again until today. I have had difficulty maintaining contact with Madison Todd and when I have been able to reach her, she has declined to set up an appointment with me. During our visit today she said she was feeling overwhelmed with so many doctor and therapy appointments but felt she was in a better place and wanted to meet with me at her home at least monthly so we could work on her diabetes management.   Also of concern to Madison Todd is her stroke recovery progress. She says she is glad to have made as much progress as she has thus far but is fearful that she "won't get any better after July because the neurologist told me if I wasn't better by July that was as good as it was going to get." We discussed her ongoing work in physical therapy and her independent exercise at home and how setting small goals might help her continue to make progress beyond July. She plans to discuss this further with her physical therapist and neurologist.   Objective:   Review of Systems  Constitutional: Negative.   HENT: Negative.   Eyes: Negative.   Respiratory: Negative.   Cardiovascular: Negative.   Genitourinary: Negative.   Musculoskeletal: Negative.   Skin: Negative.    Neurological: Positive for tingling.  Endo/Heme/Allergies: Negative.   Psychiatric/Behavioral: Negative.     Physical Exam  Constitutional: She is oriented to person, place, and time. Vital signs are normal. She appears well-developed and well-nourished. She is active.  Cardiovascular: Normal rate and regular rhythm.   Respiratory: Effort normal.  GI: Soft.  Neurological: She is alert and oriented to person, place, and time.  Skin: Skin is warm and dry.  Psychiatric: She has a normal mood and affect. Her behavior is normal. Judgment and thought content normal.    Current Medications:   Current Outpatient Prescriptions  Medication Sig Dispense Refill  . acetaminophen (TYLENOL) 325 MG tablet Take 325 mg by mouth at bedtime as needed (help relax, sleep).     Marland Kitchen amLODipine (NORVASC) 10 MG tablet Take 1 tablet (10 mg total) by mouth daily. 30 tablet 1  . aspirin EC 81 MG tablet Take 81 mg by mouth daily.    . cholecalciferol (VITAMIN D) 1000 UNITS tablet Take 1,000 Units by mouth daily.    Marland Kitchen gabapentin (NEURONTIN) 100 MG capsule Take 1 capsule (100 mg total) by mouth 2 (two) times daily. 60 capsule 1  . Linagliptin-Metformin HCl 2.01-999 MG TABS Take 1 tablet by mouth daily.    . metoprolol tartrate (LOPRESSOR) 25 MG tablet Take 0.5 tablets (12.5 mg total) by mouth 2 (two) times daily. 60 tablet 1   No current facility-administered medications for this visit.    Functional Status:   In your present state of health, do you have any difficulty  performing the following activities: 03/13/2015 03/13/2015  Hearing? N N  Vision? N N  Difficulty concentrating or making decisions? N N  Walking or climbing stairs? Y Y  Dressing or bathing? N N  Doing errands, shopping? Y Y  Conservation officer, nature and eating ? - N  Using the Toilet? - N  In the past six months, have you accidently leaked urine? - N  Do you have problems with loss of bowel control? - N  Managing your Medications? - Y  Managing your  Finances? - N  Housekeeping or managing your Housekeeping? - Y    Fall/Depression Screening:    PHQ 2/9 Scores 03/13/2015 01/22/2015 12/25/2014  PHQ - 2 Score 1 0 0  PHQ- 9 Score - 1 -    Assessment:    Chronic Health Condition (CVA) - Madison Todd is recovering well from her CVA; she is participating in PT; she has made noticeable improvement in mobility, strength, and stability of gait just since my last visit with her; she is seeing her providers as scheduled. Madison Todd weight is up a few pounds from April and she is eating at least 2 meals daily; her nutrition risk score is up to 13 from 9 since April.   Chronic Health Condition (DM) - Madison Todd has type II diabetes and says she has been making efforts at adherence to prescribed carb modified diet; the biggest change she has made is cutting down from 4 regular 12 oz Cokes a day to 1-2 each day; she admits to enjoying lots of starchy/root vegetables and lots of fruits but was able to verbalize a better understanding of her prescribed diet and in particular, the need for improved carb modification; She declines referral to the dietician and the [facility] at this time.  Madison Todd is taking Insulin Degludec (pen) 10u QD now at the same time each day. She received her new monitor from [facility] as requested and is checking cbg's regularly now.   We set new goals for this month - please see care plan section below.   Medication Non-adherence - Madison Todd has not been taking her Gabapentin as prescribed because she is concerned about it making her dizziness worse. She has central vestibular vertigo and feels dizzy "all the time". She would like to discuss use of Gabapentin further with her providers before resuming this medication.   I requested transfer of all prescriptions to [facility] in April at Madison Todd's request. Prescriptions were transferred and Madison Todd is  happy with services from her new pharmacy.   Plan:   Madison Todd will continue medications as prescribed, carb modified diet, daily cbg checks, and will attend scheduled appointments.   I will see Mrs. Horney at home next month for routine home visit.   [place]       Patient Outreach from 04/18/2015 in [place] for Problem Three  Active   [facility] Long Term Goal (31-90) days  Over the next 60 days patient will have improvement in 7, 14, and 30 day cbg averages (compared to this month) and will verbalize understanding of HgA1C and CBG as indicators for changes in management of diabetes self care   [facility] Long Term Goal Start Date  04/18/15   [facility] Long Term Goal Met Date     Interventions for Problem Three Long Term Goal  Emmi educational will be provided,  utilizing teachback method, reviewed with patient importance of "  knowing her numbers",  reviewed purpose of monitoring cbg's and difference between HgA1C and CBG   [facility] CM Short Term Goal #1 (0-30 days)  over the next 30 days patient will take medications, including insulin, as perscribed at least 75% of the time   [facility] CM Short Term Goal #1 Start Date  04/18/15   [facility] CM Short Term Goal #1 Met Date     Interventions for Short Term Goal #1  utilizing teachback method, reviewed importance of daily cbg monitoring and HgA1C checks as ordered by provider,  reviewed necessity of taking all medications as prescribed 100% of the time                       [facility] CM Short Term Goal #3 (0-30 days)  Over the next 30 days, patient will monitor carb intake, noting frequency and amount of starchy carbohydrate intake at each meal   [facility]  CM Short Term Goal #3 Start Date  04/18/15   [facility] CM Short Term Goal #3 Met Date  -- [not met,  re-established]   Interventions for Short Term Goal #3  utilizing teachback method, reviewed details of carb modifiied diet with patient        [facility]   [phone]

## 2015-05-23 ENCOUNTER — Other Ambulatory Visit: Payer: Commercial Managed Care - HMO | Admitting: *Deleted

## 2015-05-23 NOTE — Patient Outreach (Signed)
Madison Todd   05/23/2015  Madison Todd 1951-07-01 [id number]  Madison Todd is an 64 y.o. female  Subjective: "I'm doing better. The progress from the stroke is slow but I believe I'm going to keep getting better. I know my sugar is still a little bit too high but I'm working on it and I know what to do to make it get better."   Objective:   Madison Todd is a 64 year old female with history of diabetes mellitus, cardiomyopathy, COPD, and hypertension. She presented to the ED in January with severe headache, blurred vision, right-sided weakness, and difficulty in swallowing. She suffered a CVA and was admitted to the hospital from 10/01/14 - 10/23/2014.  Madison Todd was referred to Madison Todd for post hospital transition of care involvement and chronic disease Todd and intervention. I have been seeing in person or speaking with Madison Todd by phone monthly since April. She has worked over the summer to make changes to her diet and to improve adherence to her prescribed medication regimen. Madison Todd has attended all scheduled provider appointments and has completed the recommended course of outpatient physical therapy.   We were most recently working on diabetes Todd and Madison Todd was to show me cbg monitor averages today but upon my arrival for today's case closure visit, she was working with her aunt who has advanced dementia. Madison Todd is her primary caregiver. She assures me that she has been checking cbg's and is seeing improvement but knows she needs to continue to work in this area. She knows her HgA1C and wishes to continue to work on improvement in this area.    ROS  Physical Exam  Current Medications:   Current Outpatient Prescriptions  Medication Sig Dispense Refill  . acetaminophen (TYLENOL) 325 MG tablet Take 325 mg by mouth at bedtime as needed (help relax, sleep).     Marland Kitchen  amLODipine (NORVASC) 10 MG tablet Take 1 tablet (10 mg total) by mouth daily. 30 tablet 1  . aspirin EC 81 MG tablet Take 81 mg by mouth daily.    . cholecalciferol (VITAMIN D) 1000 UNITS tablet Take 1,000 Units by mouth daily.    Marland Kitchen gabapentin (NEURONTIN) 100 MG capsule Take 1 capsule (100 mg total) by mouth 2 (two) times daily. 60 capsule 1  . Linagliptin-Metformin HCl 2.01-999 MG TABS Take 1 tablet by mouth daily.    . metoprolol tartrate (LOPRESSOR) 25 MG tablet Take 0.5 tablets (12.5 mg total) by mouth 2 (two) times daily. 60 tablet 1   No current facility-administered medications for this visit.    Functional Status:   In your present state of health, do you have any difficulty performing the following activities: 03/13/2015 03/13/2015  Hearing? N N  Vision? N N  Difficulty concentrating or making decisions? N N  Walking or climbing stairs? Y Y  Dressing or bathing? N N  Doing errands, shopping? Y Y  Conservation officer, nature and eating ? - N  Using the Toilet? - N  In the past six months, have you accidently leaked urine? - N  Do you have problems with loss of bowel control? - N  Managing your Medications? - Y  Managing your Finances? - N  Housekeeping or managing your Housekeeping? - Y    Fall/Depression Screening:    PHQ 2/9 Scores 03/13/2015 01/22/2015 12/25/2014  PHQ - 2 Score 1 0 0  PHQ- 9 Score - 1 -  Assessment:    Chronic Health Condition (CVA) - Madison Todd is recovering well from her CVA; she is participating in PT; she has made noticeable improvement in mobility, strength, and stability of gait just since my last visit with her; she is seeing her providers as scheduled. Madison Todd's weight is up a few pounds from April and she is eating at least 2 meals daily; her Madison risk score is up to 13 from 9 since April.   Chronic Health Condition (DM) - Madison Todd has type II diabetes and says she has been making efforts at adherence to prescribed carb modified  diet; the biggest change she has made is cutting down from 4 regular 12 oz Cokes a day to 1-2 each day; she admits to enjoying lots of starchy/root vegetables and lots of fruits but was able to verbalize a better understanding of her prescribed diet and in particular, the need for improved carb modification; She declines referral to the dietician and the Madison Todd at this time.  Madison Todd is taking Insulin Degludec (pen) 16u QD now at the same time each day. She received her new monitor from Madison Todd as requested and is checking cbg's regularly now.   Medication Non-adherence - Madison Todd has not been taking her Gabapentin as prescribed because she is concerned about it making her dizziness worse. She has central vestibular vertigo and feels dizzy "all the time". She has discussed this with her neurologist whom she will see again on Monday.  Prescriptions were transferred to Madison Todd a few months ago and Madison Todd is happy with services from her new pharmacy  Todd: Discharge from active Madison Todd services. 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 She does not wish to enroll in health coaching or telephonic follow up. She has my direct contact information should she have a question or concern or feel she has case Todd needs in the future.    Madison Todd Problem Three        Most Recent Value   Care Todd for Problem Three  Active   Madison Long Term Goal (31-90) days  Over the next 60 days patient will have improvement in 7, 14, and 30 day cbg averages (compared to this month) and will verbalize understanding of HgA1C and CBG as indicators for changes in Todd of diabetes self care   Madison Todd Long Term Goal Start Date  04/18/15   Madison Todd Long Term Goal Met Date  05/23/15   Interventions for Problem Three Long Term Goal  utilizing teachback  method, reviewed with patient importance of "knowing her numbers",  reviewed purpose of monitoring cbg's and difference between HgA1C and CBG   Madison CM Short Term Goal #1 (0-30 days)  over the next 30 days patient will take medications, including insulin, as perscribed at least 75% of the time   [facility] CM Short Term Goal #1 Start Date  04/18/15   [facility] CM Short Term Goal #1 Met Date  05/23/15   Madison CM Short Term Goal #3 (0-30 days)  Over the next 30 days, patient will monitor carb intake, noting frequency and amount of starchy carbohydrate intake at each meal   Madison  CM Short Term Goal #3 Start Date  03/13/15   [facility] CM Short Term Goal #3 Met Date  05/23/15 [not met,  re-established]  Dalton Todd  [phone]

## 2015-05-24 ENCOUNTER — Encounter: Payer: Self-pay | Admitting: *Deleted

## 2015-05-27 ENCOUNTER — Ambulatory Visit (HOSPITAL_BASED_OUTPATIENT_CLINIC_OR_DEPARTMENT_OTHER): Payer: Medicare HMO | Admitting: Physical Medicine & Rehabilitation

## 2015-05-27 ENCOUNTER — Encounter: Payer: Commercial Managed Care - HMO | Attending: Physical Medicine & Rehabilitation

## 2015-05-27 ENCOUNTER — Encounter: Payer: Self-pay | Admitting: Physical Medicine & Rehabilitation

## 2015-05-27 VITALS — BP 145/85 | HR 79

## 2015-05-27 DIAGNOSIS — G464 Cerebellar stroke syndrome: Secondary | ICD-10-CM | POA: Insufficient documentation

## 2015-05-27 DIAGNOSIS — R270 Ataxia, unspecified: Secondary | ICD-10-CM | POA: Diagnosis present

## 2015-05-27 DIAGNOSIS — G463 Brain stem stroke syndrome: Principal | ICD-10-CM

## 2015-05-27 DIAGNOSIS — I69391 Dysphagia following cerebral infarction: Secondary | ICD-10-CM | POA: Diagnosis not present

## 2015-05-27 DIAGNOSIS — H8141 Vertigo of central origin, right ear: Secondary | ICD-10-CM | POA: Insufficient documentation

## 2015-05-27 MED ORDER — GABAPENTIN 100 MG PO CAPS: 100.0000 mg | ORAL_CAPSULE | Freq: Two times a day (BID) | ORAL | Status: AC

## 2015-05-27 NOTE — Patient Instructions (Addendum)
May resume driving  Discuss your bladder issue with your primary physician when you see him next

## 2015-05-27 NOTE — Progress Notes (Signed)
Subjective:    Patient ID: Madison Todd, female    DOB: April 13, 1951, 64 y.o.   MRN: [id number]  HPI Right sided facial Paresthesias. Still has difficulty with feeling temperature on the left side of her body. Feels like there is something in her right eye sometimes No visual problems noted. Ambulating now without a walker or cane. Dressing and bathing independently. Patient cooks by herself but needs some assistance with cleaning Pain Inventory Average Pain 5 Pain Right Now 5 My pain is dull and tingling  In the last 24 hours, has pain interfered with the following? General activity 7 Relation with others 5 Enjoyment of life 4 What TIME of day is your pain at its worst? evening Sleep (in general) Fair  Pain is worse with: unsure Pain improves with: no pain med Relief from Meds: 5  Mobility walk without assistance ability to climb steps?  yes do you drive?  no  Function retired I need assistance with the following:  household duties and shopping  Neuro/Psych bladder control problems weakness numbness tingling dizziness loss of taste or smell  Prior Studies Any changes since last visit?  no  Physicians involved in your care Any changes since last visit?  no   Family History  Problem Relation Age of Onset  . Diabetes Father   . Cancer Maternal Aunt   . Cancer Paternal Aunt   . Colon cancer Neg Hx    Social History   Social History  . Marital Status: Legally Separated    Spouse Name: N/A  . Number of Children: 3  . Years of Education: N/A   Occupational History  .     Social History Main Topics  . Smoking status: Current Every Day Smoker -- 0.50 packs/day for 18 years    Types: Cigarettes  . Smokeless tobacco: Never Used     Comment: smokes 12 cigarettes daily  . Alcohol Use: No  . Drug Use: No  . Sexual Activity: No   Other Topics Concern  . None   Social History Narrative   Past Surgical History  Procedure Laterality Date    . Mastectomy, radical      right  . Breast lumpectomy      left  . Port-a-cath removal    . Colonoscopy  07/20/2005    RMR: Diminutive ascending colon polyp, status post resection . Inflamed focally adenomatous polyp  . Colonoscopy N/A 10/05/2013    RMR: Multiple rectal and colonic polyps-removed/treated. 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 other polyps tubular adenomas  . Flexible sigmoidoscopy N/A 10/11/2013    RMR: rectal polypectomy site identified, assitional resection and tatooing performed. 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 Flexible sigmoidoscopy N/A 11/24/2013    Procedure: FLEXIBLE SIGMOIDOSCOPY;  Surgeon: Daneil Dolin, MD;  Location: [facility];  Service: Endoscopy;  Laterality: N/A;  1:00-moved to [place] notified pt   Past Medical History  Diagnosis Date  . Leukocytoclastic vasculitis   . Diabetes mellitus   . Breast cancer     left 2007/right 1997  . Left-sided Breast cancer 07/22/2011  . Cardiomyopathy 07/22/2011  . COPD (chronic obstructive pulmonary disease) 07/22/2011  . Hypertension   . Hyperlipidemia   . 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  Stage II (T2 N0).  ER 73%, PR 90%, Her2 positive, Ki-67 19%.  2.1 cm in size.  Agreed to only  have radiation, Herceptin, and Aromasin.  Right sided breast cancer: Stage II (T2 N0 M0).  2.5 cm in size.  Surgery on 04/28/1996 with axillary dissection on 05/10/1996.  11 negative nodes.  Treated with AC x 4 cycles for ER negative, PR positive at only 20%.    . Breast cancer, right breast 12/16/2013  . Stroke    BP 145/85 mmHg  Pulse 79  SpO2 93%  Opioid Risk Score:   Fall Risk Score:  `1  Depression screen PHQ 2/9  Depression screen [facility] 2/9 05/27/2015 05/23/2015 03/13/2015 01/22/2015 12/25/2014  Decreased Interest 0 0 0 0 0  Down, Depressed, Hopeless 1 0 1 0 0  PHQ - 2 Score 1 0 1 0 0  Altered sleeping - - - 0 -  Tired,  decreased energy - - - 1 -  Change in appetite - - - 0 -  Feeling bad or failure about yourself  - - - 0 -  Trouble concentrating - - - 0 -  Moving slowly or fidgety/restless - - - 0 -  Suicidal thoughts - - - 0 -  PHQ-9 Score - - - 1 -    Review of Systems  Constitutional:       Loss of taste  Endocrine:       High blood sugar  Genitourinary:       Bladder control problems  Neurological: Positive for dizziness and weakness.       Tingling  All other systems reviewed and are negative.      Objective:   Physical Exam  Constitutional: She is oriented to person, place, and time. She appears well-developed and well-nourished.  HENT:  Head: Normocephalic and atraumatic.  Eyes: Conjunctivae and EOM are normal. Pupils are equal, round, and reactive to light.  Neck: Normal range of motion.  Musculoskeletal: Normal range of motion.  Neurological: She is alert and oriented to person, place, and time.  Psychiatric: She has a normal mood and affect.  Nursing note and vitals reviewed.   Motor strength is 5/5 bilateral deltoids, biceps, triceps, grip, Bilateral lower extremity is 5/5 in hip flexors and extensor ankle dorsiflexor There is equal sensation to pinprick bilateral V1 and V2 V3 There is reduced sensation to pinprick in the left arm compared to the right arm.  Minimal dysmetria right finger-nose-finger normal on the left Ambulates with a wide base of support but otherwise no evidence of toe drag or knee instability or assist device needed      Assessment & Plan:  1. Wallenberg syndrome with ipsilateral ataxia as well as ipsilateral facial sensory deficits and contralateral limb and hemibody sensory deficit. Patient has made good improvements in terms of her stroke. She has some mild deficits.  I do think she can resume driving  2. Hoarseness elated to her stroke as well  nerves IX and X are typically involved with a Wallenberg syndrome, This has improved,do not think she  needs to see ENT    3. Facial dysesthetic pain related to Wallenberg syndrome,will restart Gabapentin100 mg twice a day.May need further adjustment Primary care can refill this if needed Further dosage adjustment needed I be happy to see the patient back but otherwise I'll see her on a as needed basis

## 2015-07-10 ENCOUNTER — Ambulatory Visit (HOSPITAL_COMMUNITY)
Admission: RE | Admit: 2015-07-10 | Discharge: 2015-07-10 | Disposition: A | Discharge status: Home / Self Care | Payer: Commercial Managed Care - HMO | Source: Ambulatory Visit | Attending: Family Medicine | Admitting: Family Medicine

## 2015-07-10 ENCOUNTER — Other Ambulatory Visit (HOSPITAL_COMMUNITY): Payer: Self-pay | Admitting: Family Medicine

## 2015-07-10 DIAGNOSIS — J441 Chronic obstructive pulmonary disease with (acute) exacerbation: Secondary | ICD-10-CM

## 2015-07-10 DIAGNOSIS — Z853 Personal history of malignant neoplasm of breast: Secondary | ICD-10-CM | POA: Diagnosis not present

## 2015-07-10 DIAGNOSIS — J449 Chronic obstructive pulmonary disease, unspecified: Secondary | ICD-10-CM | POA: Insufficient documentation

## 2015-07-10 DIAGNOSIS — R05 Cough: Secondary | ICD-10-CM | POA: Diagnosis present

## 2015-07-10 DIAGNOSIS — Z87891 Personal history of nicotine dependence: Secondary | ICD-10-CM | POA: Insufficient documentation

## 2015-07-10 DIAGNOSIS — J984 Other disorders of lung: Secondary | ICD-10-CM | POA: Insufficient documentation

## 2015-07-10 DIAGNOSIS — R0602 Shortness of breath: Secondary | ICD-10-CM | POA: Insufficient documentation

## 2015-07-17 ENCOUNTER — Inpatient Hospital Stay (HOSPITAL_COMMUNITY)
Admission: EM | Admit: 2015-07-17 | Discharge: 2015-08-15 | DRG: 870 | Disposition: E | Payer: Commercial Managed Care - HMO | Attending: Pulmonary Disease | Admitting: Pulmonary Disease

## 2015-07-17 ENCOUNTER — Emergency Department (HOSPITAL_COMMUNITY): Payer: Commercial Managed Care - HMO

## 2015-07-17 ENCOUNTER — Encounter (HOSPITAL_COMMUNITY): Payer: Self-pay

## 2015-07-17 ENCOUNTER — Inpatient Hospital Stay (HOSPITAL_COMMUNITY): Payer: Commercial Managed Care - HMO

## 2015-07-17 DIAGNOSIS — F17210 Nicotine dependence, cigarettes, uncomplicated: Secondary | ICD-10-CM | POA: Diagnosis present

## 2015-07-17 DIAGNOSIS — E876 Hypokalemia: Secondary | ICD-10-CM | POA: Diagnosis present

## 2015-07-17 DIAGNOSIS — R6521 Severe sepsis with septic shock: Secondary | ICD-10-CM | POA: Diagnosis present

## 2015-07-17 DIAGNOSIS — R092 Respiratory arrest: Secondary | ICD-10-CM | POA: Diagnosis present

## 2015-07-17 DIAGNOSIS — G936 Cerebral edema: Secondary | ICD-10-CM | POA: Diagnosis not present

## 2015-07-17 DIAGNOSIS — Z809 Family history of malignant neoplasm, unspecified: Secondary | ICD-10-CM

## 2015-07-17 DIAGNOSIS — E1165 Type 2 diabetes mellitus with hyperglycemia: Secondary | ICD-10-CM | POA: Diagnosis present

## 2015-07-17 DIAGNOSIS — J810 Acute pulmonary edema: Secondary | ICD-10-CM | POA: Insufficient documentation

## 2015-07-17 DIAGNOSIS — G463 Brain stem stroke syndrome: Secondary | ICD-10-CM | POA: Diagnosis present

## 2015-07-17 DIAGNOSIS — Z923 Personal history of irradiation: Secondary | ICD-10-CM

## 2015-07-17 DIAGNOSIS — Z9011 Acquired absence of right breast and nipple: Secondary | ICD-10-CM

## 2015-07-17 DIAGNOSIS — E785 Hyperlipidemia, unspecified: Secondary | ICD-10-CM | POA: Diagnosis present

## 2015-07-17 DIAGNOSIS — I110 Hypertensive heart disease with heart failure: Secondary | ICD-10-CM | POA: Diagnosis present

## 2015-07-17 DIAGNOSIS — Z888 Allergy status to other drugs, medicaments and biological substances status: Secondary | ICD-10-CM

## 2015-07-17 DIAGNOSIS — I469 Cardiac arrest, cause unspecified: Secondary | ICD-10-CM

## 2015-07-17 DIAGNOSIS — Z833 Family history of diabetes mellitus: Secondary | ICD-10-CM | POA: Diagnosis not present

## 2015-07-17 DIAGNOSIS — Z79899 Other long term (current) drug therapy: Secondary | ICD-10-CM

## 2015-07-17 DIAGNOSIS — E874 Mixed disorder of acid-base balance: Secondary | ICD-10-CM | POA: Diagnosis present

## 2015-07-17 DIAGNOSIS — Z7982 Long term (current) use of aspirin: Secondary | ICD-10-CM | POA: Diagnosis not present

## 2015-07-17 DIAGNOSIS — I5033 Acute on chronic diastolic (congestive) heart failure: Secondary | ICD-10-CM | POA: Diagnosis present

## 2015-07-17 DIAGNOSIS — R0602 Shortness of breath: Secondary | ICD-10-CM

## 2015-07-17 DIAGNOSIS — Z4659 Encounter for fitting and adjustment of other gastrointestinal appliance and device: Secondary | ICD-10-CM

## 2015-07-17 DIAGNOSIS — Z66 Do not resuscitate: Secondary | ICD-10-CM | POA: Diagnosis not present

## 2015-07-17 DIAGNOSIS — G931 Anoxic brain damage, not elsewhere classified: Secondary | ICD-10-CM | POA: Diagnosis not present

## 2015-07-17 DIAGNOSIS — A419 Sepsis, unspecified organism: Secondary | ICD-10-CM | POA: Diagnosis present

## 2015-07-17 DIAGNOSIS — I429 Cardiomyopathy, unspecified: Secondary | ICD-10-CM | POA: Diagnosis present

## 2015-07-17 DIAGNOSIS — Z8673 Personal history of transient ischemic attack (TIA), and cerebral infarction without residual deficits: Secondary | ICD-10-CM

## 2015-07-17 DIAGNOSIS — J9600 Acute respiratory failure, unspecified whether with hypoxia or hypercapnia: Secondary | ICD-10-CM | POA: Diagnosis not present

## 2015-07-17 DIAGNOSIS — Z853 Personal history of malignant neoplasm of breast: Secondary | ICD-10-CM

## 2015-07-17 DIAGNOSIS — E11649 Type 2 diabetes mellitus with hypoglycemia without coma: Secondary | ICD-10-CM | POA: Diagnosis present

## 2015-07-17 DIAGNOSIS — Z794 Long term (current) use of insulin: Secondary | ICD-10-CM

## 2015-07-17 DIAGNOSIS — E871 Hypo-osmolality and hyponatremia: Secondary | ICD-10-CM | POA: Diagnosis present

## 2015-07-17 DIAGNOSIS — J449 Chronic obstructive pulmonary disease, unspecified: Secondary | ICD-10-CM | POA: Diagnosis not present

## 2015-07-17 DIAGNOSIS — J9601 Acute respiratory failure with hypoxia: Secondary | ICD-10-CM | POA: Diagnosis present

## 2015-07-17 DIAGNOSIS — J189 Pneumonia, unspecified organism: Secondary | ICD-10-CM | POA: Diagnosis present

## 2015-07-17 DIAGNOSIS — R4182 Altered mental status, unspecified: Secondary | ICD-10-CM

## 2015-07-17 DIAGNOSIS — Z9289 Personal history of other medical treatment: Secondary | ICD-10-CM

## 2015-07-17 DIAGNOSIS — J9690 Respiratory failure, unspecified, unspecified whether with hypoxia or hypercapnia: Secondary | ICD-10-CM

## 2015-07-17 DIAGNOSIS — Z515 Encounter for palliative care: Secondary | ICD-10-CM | POA: Diagnosis present

## 2015-07-17 LAB — URINALYSIS, ROUTINE W REFLEX MICROSCOPIC
GLUCOSE, UA: 100 mg/dL — AB
KETONES UR: NEGATIVE mg/dL
LEUKOCYTES UA: NEGATIVE
Nitrite: NEGATIVE
PH: 5.5 (ref 5.0–8.0)
Specific Gravity, Urine: >1.03 — ABNORMAL HIGH (ref 1.005–1.030)
Urobilinogen, UA: 2 mg/dL — ABNORMAL HIGH (ref 0.0–1.0)

## 2015-07-17 LAB — I-STAT CG4 LACTIC ACID, ED: LACTIC ACID, VENOUS: 8.65 mmol/L — AB (ref 0.5–2.0)

## 2015-07-17 LAB — BLOOD GAS, ARTERIAL
ACID-BASE DEFICIT: 4.6 mmol/L — AB (ref 0.0–2.0)
Bicarbonate: 20.2 mEq/L (ref 20.0–24.0)
DRAWN BY: 234301
FIO2: 100
O2 Saturation: 98.9 %
PEEP: 5 cmH2O
RATE: 15 resp/min
VT: 500 mL
pCO2 arterial: 44.2 mmHg (ref 35.0–45.0)
pH, Arterial: 7.294 — ABNORMAL LOW (ref 7.350–7.450)
pO2, Arterial: 154 mmHg — ABNORMAL HIGH (ref 80.0–100.0)

## 2015-07-17 LAB — CBC WITH DIFFERENTIAL/PLATELET
Basophils Absolute: 0 10*3/uL (ref 0.0–0.1)
Basophils Relative: 0 %
EOS PCT: 0 %
Eosinophils Absolute: 0 10*3/uL (ref 0.0–0.7)
HCT: 47.8 % — ABNORMAL HIGH (ref 36.0–46.0)
Hemoglobin: 15.4 g/dL — ABNORMAL HIGH (ref 12.0–15.0)
LYMPHS ABS: 1.2 10*3/uL (ref 0.7–4.0)
LYMPHS PCT: 7 %
MCH: 26.9 pg (ref 26.0–34.0)
MCHC: 32.2 g/dL (ref 30.0–36.0)
MCV: 83.4 fL (ref 78.0–100.0)
MONOS PCT: 8 %
Monocytes Absolute: 1.3 10*3/uL — ABNORMAL HIGH (ref 0.1–1.0)
Neutro Abs: 14.4 10*3/uL — ABNORMAL HIGH (ref 1.7–7.7)
Neutrophils Relative %: 85 %
PLATELETS: 308 10*3/uL (ref 150–400)
RBC: 5.73 MIL/uL — AB (ref 3.87–5.11)
RDW: 16.9 % — ABNORMAL HIGH (ref 11.5–15.5)
WBC: 17 10*3/uL — AB (ref 4.0–10.5)

## 2015-07-17 LAB — POCT I-STAT 3, ART BLOOD GAS (G3+)
Acid-Base Excess: 3 mmol/L — ABNORMAL HIGH (ref 0.0–2.0)
Bicarbonate: 28.3 mEq/L — ABNORMAL HIGH (ref 20.0–24.0)
O2 SAT: 96 %
PH ART: 7.43 (ref 7.350–7.450)
TCO2: 30 mmol/L (ref 0–100)
pCO2 arterial: 42.7 mmHg (ref 35.0–45.0)
pO2, Arterial: 79 mmHg — ABNORMAL LOW (ref 80.0–100.0)

## 2015-07-17 LAB — BASIC METABOLIC PANEL
ANION GAP: 15 (ref 5–15)
BUN: 13 mg/dL (ref 6–20)
CHLORIDE: 86 mmol/L — AB (ref 101–111)
CO2: 25 mmol/L (ref 22–32)
CREATININE: 0.94 mg/dL (ref 0.44–1.00)
Calcium: 8.2 mg/dL — ABNORMAL LOW (ref 8.9–10.3)
GFR calc non Af Amer: >60 mL/min (ref >60)
Glucose, Bld: 200 mg/dL — ABNORMAL HIGH (ref 65–99)
POTASSIUM: 3.3 mmol/L — AB (ref 3.5–5.1)
SODIUM: 126 mmol/L — AB (ref 135–145)

## 2015-07-17 LAB — TROPONIN I
Troponin I: 0.05 ng/mL — ABNORMAL HIGH (ref <0.031)
Troponin I: 0.11 ng/mL — ABNORMAL HIGH (ref <0.031)

## 2015-07-17 LAB — COMPREHENSIVE METABOLIC PANEL
ALBUMIN: 3 g/dL — AB (ref 3.5–5.0)
ALT: 78 U/L — AB (ref 14–54)
AST: 123 U/L — AB (ref 15–41)
Alkaline Phosphatase: 107 U/L (ref 38–126)
Anion gap: 19 — ABNORMAL HIGH (ref 5–15)
BUN: 15 mg/dL (ref 6–20)
CHLORIDE: 81 mmol/L — AB (ref 101–111)
CO2: 20 mmol/L — AB (ref 22–32)
CREATININE: 0.81 mg/dL (ref 0.44–1.00)
Calcium: 8.1 mg/dL — ABNORMAL LOW (ref 8.9–10.3)
GFR calc Af Amer: >60 mL/min (ref >60)
GLUCOSE: 188 mg/dL — AB (ref 65–99)
POTASSIUM: 3.2 mmol/L — AB (ref 3.5–5.1)
SODIUM: 120 mmol/L — AB (ref 135–145)
Total Bilirubin: 1.9 mg/dL — ABNORMAL HIGH (ref 0.3–1.2)
Total Protein: 6.9 g/dL (ref 6.5–8.1)

## 2015-07-17 LAB — URINE MICROSCOPIC-ADD ON

## 2015-07-17 LAB — APTT: APTT: 31 s (ref 24–37)

## 2015-07-17 LAB — PROTIME-INR
INR: 1.29 (ref 0.00–1.49)
PROTHROMBIN TIME: 16.2 s — AB (ref 11.6–15.2)

## 2015-07-17 LAB — BRAIN NATRIURETIC PEPTIDE: B Natriuretic Peptide: 472 pg/mL — ABNORMAL HIGH (ref 0.0–100.0)

## 2015-07-17 MED ORDER — CISATRACURIUM BOLUS VIA INFUSION
0.0500 mg/kg | INTRAVENOUS | Status: DC | PRN
  Filled 2015-07-17: qty 5

## 2015-07-17 MED ORDER — ARTIFICIAL TEARS OP OINT: 1.0000 "application " | TOPICAL_OINTMENT | Freq: Three times a day (TID) | OPHTHALMIC | Status: DC

## 2015-07-17 MED ORDER — MIDAZOLAM HCL 2 MG/2ML IJ SOLN: 1.0000 mg | Freq: Once | INTRAMUSCULAR | Status: DC

## 2015-07-17 MED ORDER — IOHEXOL 350 MG/ML SOLN
90.0000 mL | Freq: Once | INTRAVENOUS | Status: AC | PRN
  Administered 2015-07-17: 90 mL via INTRAVENOUS

## 2015-07-17 MED ORDER — PIPERACILLIN-TAZOBACTAM 3.375 G IVPB 30 MIN
3.3750 g | Freq: Once | INTRAVENOUS | Status: AC
  Administered 2015-07-17: 3.375 g via INTRAVENOUS
  Filled 2015-07-17: qty 50

## 2015-07-17 MED ORDER — ALBUTEROL SULFATE (2.5 MG/3ML) 0.083% IN NEBU: 2.5000 mg | INHALATION_SOLUTION | RESPIRATORY_TRACT | Status: DC | PRN

## 2015-07-17 MED ORDER — SUCCINYLCHOLINE CHLORIDE 20 MG/ML IJ SOLN
INTRAMUSCULAR | Status: AC
  Filled 2015-07-17: qty 1

## 2015-07-17 MED ORDER — METHYLPREDNISOLONE SODIUM SUCC 125 MG IJ SOLR
125.0000 mg | Freq: Once | INTRAMUSCULAR | Status: AC
  Administered 2015-07-17: 125 mg via INTRAVENOUS

## 2015-07-17 MED ORDER — NOREPINEPHRINE BITARTRATE 1 MG/ML IV SOLN
INTRAVENOUS | Status: AC
  Filled 2015-07-17: qty 4

## 2015-07-17 MED ORDER — MIDAZOLAM BOLUS VIA INFUSION
1.0000 mg | INTRAVENOUS | Status: DC | PRN
  Filled 2015-07-17: qty 1

## 2015-07-17 MED ORDER — HEPARIN SODIUM (PORCINE) 5000 UNIT/ML IJ SOLN
5000.0000 [IU] | Freq: Three times a day (TID) | INTRAMUSCULAR | Status: DC
  Administered 2015-07-18 – 2015-07-26 (×26): 5000 [IU] via SUBCUTANEOUS
  Filled 2015-07-17 (×26): qty 1

## 2015-07-17 MED ORDER — IPRATROPIUM BROMIDE 0.02 % IN SOLN
0.5000 mg | Freq: Once | RESPIRATORY_TRACT | Status: AC
  Administered 2015-07-17: 0.5 mg via RESPIRATORY_TRACT

## 2015-07-17 MED ORDER — FUROSEMIDE 10 MG/ML IJ SOLN
40.0000 mg | INTRAMUSCULAR | Status: AC
  Administered 2015-07-17: 40 mg via INTRAVENOUS
  Filled 2015-07-17: qty 4

## 2015-07-17 MED ORDER — ETOMIDATE 2 MG/ML IV SOLN
INTRAVENOUS | Status: AC
  Filled 2015-07-17: qty 20

## 2015-07-17 MED ORDER — DEXTROSE 5 % IV SOLN
500.0000 mg | Freq: Every day | INTRAVENOUS | Status: DC
  Administered 2015-07-18 – 2015-07-25 (×9): 500 mg via INTRAVENOUS
  Filled 2015-07-17 (×10): qty 500

## 2015-07-17 MED ORDER — FENTANYL BOLUS VIA INFUSION
25.0000 ug | INTRAVENOUS | Status: DC | PRN
  Filled 2015-07-17: qty 25

## 2015-07-17 MED ORDER — ALBUTEROL (5 MG/ML) CONTINUOUS INHALATION SOLN
INHALATION_SOLUTION | RESPIRATORY_TRACT | Status: AC
  Administered 2015-07-17: 10 mg via RESPIRATORY_TRACT
  Filled 2015-07-17: qty 20

## 2015-07-17 MED ORDER — NOREPINEPHRINE BITARTRATE 1 MG/ML IV SOLN
0.0000 ug/min | INTRAVENOUS | Status: DC
  Administered 2015-07-19: 2 ug/min via INTRAVENOUS
  Administered 2015-07-19: 1 ug/min via INTRAVENOUS
  Filled 2015-07-17: qty 4

## 2015-07-17 MED ORDER — METHYLPREDNISOLONE SODIUM SUCC 125 MG IJ SOLR
INTRAMUSCULAR | Status: AC
  Filled 2015-07-17: qty 2

## 2015-07-17 MED ORDER — PROPOFOL 1000 MG/100ML IV EMUL
INTRAVENOUS | Status: AC
  Administered 2015-07-17: 30 ug/kg/min via INTRAVENOUS
  Filled 2015-07-17: qty 100

## 2015-07-17 MED ORDER — DEXTROSE 5 % IV SOLN
1.0000 g | Freq: Every day | INTRAVENOUS | Status: DC
  Administered 2015-07-18 – 2015-07-25 (×9): 1 g via INTRAVENOUS
  Filled 2015-07-17 (×10): qty 10

## 2015-07-17 MED ORDER — FENTANYL CITRATE (PF) 100 MCG/2ML IJ SOLN: 50.0000 ug | Freq: Once | INTRAMUSCULAR | Status: DC

## 2015-07-17 MED ORDER — PROPOFOL 1000 MG/100ML IV EMUL
50.0000 ug/kg/min | Freq: Once | INTRAVENOUS | Status: DC
  Administered 2015-07-17 (×2): 50 ug/kg/min via INTRAVENOUS

## 2015-07-17 MED ORDER — PANTOPRAZOLE SODIUM 40 MG IV SOLR: 40.0000 mg | Freq: Every day | INTRAVENOUS | Status: DC

## 2015-07-17 MED ORDER — MIDAZOLAM HCL 2 MG/2ML IJ SOLN
1.0000 mg | INTRAMUSCULAR | Status: DC | PRN
  Administered 2015-07-18 (×3): 1 mg via INTRAVENOUS
  Filled 2015-07-17 (×3): qty 2

## 2015-07-17 MED ORDER — FUROSEMIDE 10 MG/ML IJ SOLN
40.0000 mg | Freq: Once | INTRAMUSCULAR | Status: DC
Start: 2015-07-17 — End: 2015-07-17

## 2015-07-17 MED ORDER — PROPOFOL 1000 MG/100ML IV EMUL
30.0000 ug/kg/min | Freq: Once | INTRAVENOUS | Status: DC
  Administered 2015-07-17: 30 ug/kg/min via INTRAVENOUS

## 2015-07-17 MED ORDER — ASPIRIN 81 MG PO CHEW
81.0000 mg | CHEWABLE_TABLET | Freq: Every day | ORAL | Status: DC
  Administered 2015-07-18 – 2015-07-25 (×9): 81 mg
  Filled 2015-07-17 (×9): qty 1

## 2015-07-17 MED ORDER — ASPIRIN 300 MG RE SUPP: 300.0000 mg | RECTAL | Status: DC

## 2015-07-17 MED ORDER — HEPARIN SODIUM (PORCINE) 5000 UNIT/ML IJ SOLN: 5000.0000 [IU] | Freq: Three times a day (TID) | INTRAMUSCULAR | Status: DC

## 2015-07-17 MED ORDER — SODIUM CHLORIDE 0.9 % IV SOLN
1.0000 mg/h | INTRAVENOUS | Status: DC
  Filled 2015-07-17: qty 10

## 2015-07-17 MED ORDER — ALBUTEROL (5 MG/ML) CONTINUOUS INHALATION SOLN
10.0000 mg | INHALATION_SOLUTION | RESPIRATORY_TRACT | Status: DC
  Administered 2015-07-17: 10 mg via RESPIRATORY_TRACT

## 2015-07-17 MED ORDER — NOREPINEPHRINE BITARTRATE 1 MG/ML IV SOLN
0.0000 ug/min | INTRAVENOUS | Status: DC
  Filled 2015-07-17: qty 4

## 2015-07-17 MED ORDER — PANTOPRAZOLE SODIUM 40 MG IV SOLR
40.0000 mg | Freq: Every day | INTRAVENOUS | Status: DC
  Administered 2015-07-18 – 2015-07-23 (×7): 40 mg via INTRAVENOUS
  Filled 2015-07-17 (×7): qty 40

## 2015-07-17 MED ORDER — POTASSIUM CHLORIDE 20 MEQ/15ML (10%) PO SOLN
40.0000 meq | ORAL | Status: AC
  Administered 2015-07-18 (×2): 40 meq
  Filled 2015-07-17 (×2): qty 30

## 2015-07-17 MED ORDER — SODIUM CHLORIDE 0.9 % IV SOLN
1.0000 ug/kg/min | INTRAVENOUS | Status: DC
  Filled 2015-07-17: qty 20

## 2015-07-17 MED ORDER — FENTANYL CITRATE (PF) 100 MCG/2ML IJ SOLN
50.0000 ug | INTRAMUSCULAR | Status: DC | PRN
  Administered 2015-07-18 (×2): 50 ug via INTRAVENOUS
  Filled 2015-07-17 (×2): qty 2

## 2015-07-17 MED ORDER — CISATRACURIUM BOLUS VIA INFUSION
0.1000 mg/kg | Freq: Once | INTRAVENOUS | Status: DC
  Filled 2015-07-17: qty 9

## 2015-07-17 MED ORDER — PROPOFOL 1000 MG/100ML IV EMUL: 5.0000 ug/kg/min | Freq: Once | INTRAVENOUS | Status: DC

## 2015-07-17 MED ORDER — ROCURONIUM BROMIDE 50 MG/5ML IV SOLN
INTRAVENOUS | Status: AC
  Filled 2015-07-17: qty 2

## 2015-07-17 MED ORDER — PROPOFOL 1000 MG/100ML IV EMUL
INTRAVENOUS | Status: AC
  Filled 2015-07-17: qty 100

## 2015-07-17 MED ORDER — SODIUM CHLORIDE 0.9 % IV SOLN
INTRAVENOUS | Status: DC
  Administered 2015-07-18 (×2): via INTRAVENOUS
  Administered 2015-07-23: 250 mL via INTRAVENOUS

## 2015-07-17 MED ORDER — SODIUM CHLORIDE 0.9 % IV SOLN
25.0000 ug/h | INTRAVENOUS | Status: DC
  Filled 2015-07-17: qty 50

## 2015-07-17 MED ORDER — LIDOCAINE HCL (CARDIAC) 20 MG/ML IV SOLN
INTRAVENOUS | Status: AC
  Filled 2015-07-17: qty 5

## 2015-07-17 MED ORDER — NOREPINEPHRINE BITARTRATE 1 MG/ML IV SOLN
2.0000 ug/min | INTRAVENOUS | Status: DC
  Administered 2015-07-17: 2 ug/min via INTRAVENOUS
  Filled 2015-07-17: qty 4

## 2015-07-17 MED ORDER — IPRATROPIUM BROMIDE 0.02 % IN SOLN
RESPIRATORY_TRACT | Status: AC
  Administered 2015-07-17: 0.5 mg via RESPIRATORY_TRACT
  Filled 2015-07-17: qty 2.5

## 2015-07-17 NOTE — Code Documentation (Signed)
Pt arrived with king airway size 4.  Dr. Sabra Heck bagging pt, preparing to intubate.

## 2015-07-17 NOTE — ED Provider Notes (Signed)
CSN: [id number]     Arrival date & time 08/03/2015  1750 History   First MD Initiated Contact with Patient 07/16/2015 1816     Chief Complaint  Patient presents with  . Respiratory Arrest     (Consider location/radiation/quality/duration/timing/severity/associated sxs/prior Treatment) HPI Comments: The patient is a 64 year old female, she has a history of breast cancer, COPD, cardiomyopathy, stroke, she has had a radical mastectomy on the right and a lumpectomy on the left, presents by EMS after she was found to be in severe respiratory distress on the edge of the bed in a tripod position unable to speak. Very soon thereafter the patient went into a respiratory arrest and required airway management with a King airway, she then had a cardiac arrest for approximately 1 minute during which time she underwent CPR and was given 1 mg of epinephrine which resulted in return of spontaneous circulation. Her rhythm during this time was pulseless electrical activity. There is no other information available at this time. The patient is unresponsive and in severe respiratory distress  The history is provided by the EMS personnel.    Past Medical History  Diagnosis Date  . Leukocytoclastic vasculitis ([place])   . Diabetes mellitus   . Breast cancer ([place])     left 2007/right 1997  . Left-sided Breast cancer 07/22/2011  . Cardiomyopathy 07/22/2011  . COPD (chronic obstructive pulmonary disease) ([place]) 07/22/2011  . Hypertension   . Hyperlipidemia   . 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  Stage II (T2 N0).  ER 73%, PR 90%, Her2 positive, Ki-67 19%.  2.1 cm in size.  Agreed to only have radiation, Herceptin, and Aromasin.  Right sided breast cancer: Stage II (T2 N0 M0).  2.5 cm in size.  Surgery on 04/28/1996 with axillary dissection on 05/10/1996.  11 negative nodes.  Treated with AC x 4 cycles for ER negative, PR positive at only 20%.    . Breast  cancer, right breast ([place]) 12/16/2013  . Stroke [facility])    Past Surgical History  Procedure Laterality Date  . Mastectomy, radical      right  . Breast lumpectomy      left  . Port-a-cath removal    . Colonoscopy  07/20/2005    RMR: Diminutive ascending colon polyp, status post resection . Inflamed focally adenomatous polyp  . Colonoscopy N/A 10/05/2013    RMR: Multiple rectal and colonic polyps-removed/treated. 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 other polyps tubular adenomas  . Flexible sigmoidoscopy N/A 10/11/2013    RMR: rectal polypectomy site identified, assitional resection and tatooing performed. 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 Flexible sigmoidoscopy N/A 11/24/2013    Procedure: FLEXIBLE SIGMOIDOSCOPY;  Surgeon: Daneil Dolin, MD;  Location: [facility];  Service: Endoscopy;  Laterality: N/A;  1:00-moved to [place] notified pt   Family History  Problem Relation Age of Onset  . Diabetes Father   . Cancer Maternal Aunt   . Cancer Paternal Aunt   . Colon cancer Neg Hx    Social History  Substance Use Topics  . Smoking status: Current Every Day Smoker -- 0.50 packs/day for 18 years    Types: Cigarettes  . Smokeless tobacco: Never Used     Comment: smokes 12 cigarettes daily  . Alcohol Use: No   OB History    No data available     Review of Systems  Unable to perform ROS: Severe respiratory distress      Allergies  Crestor; Lipitor; Lisinopril; and Aromasin  Home Medications   Prior to Admission medications   Medication Sig Start Date End Date Taking? Authorizing Provider  amLODipine (NORVASC) 10 MG tablet Take 1 tablet (10 mg total) by mouth daily. Patient taking differently: Take 10 mg by mouth every other day.  10/23/14  Yes Daniel J Angiulli, PA-C  aspirin EC 81 MG tablet Take 81 mg by mouth daily.   Yes Historical Provider, MD  cholecalciferol (VITAMIN D) 1000 UNITS tablet Take 1,000 Units by mouth daily.   Yes  Historical Provider, MD  Insulin Degludec (TRESIBA FLEXTOUCH) 200 UNIT/ML SOPN Inject 200 Units into the skin daily.    Yes Historical Provider, MD  metoprolol tartrate (LOPRESSOR) 25 MG tablet Take 0.5 tablets (12.5 mg total) by mouth 2 (two) times daily. 10/23/14  Yes Daniel J Angiulli, PA-C  acetaminophen (TYLENOL) 325 MG tablet Take 325 mg by mouth at bedtime as needed (help relax, sleep).     Historical Provider, MD  gabapentin (NEURONTIN) 100 MG capsule Take 1 capsule (100 mg total) by mouth 2 (two) times daily. Patient not taking: Reported on 07/23/2015 05/27/15   Charlett Blake, MD   BP 88/62 mmHg  Pulse 77  Temp(Src) 97.3 F (36.3 C)  Resp 15  SpO2 93% Physical Exam  Constitutional:  Ill-appearing, unresponsive, severe respiratory distress  HENT:  Secretions in the oropharynx, yellow, thick  Eyes:  Conjunctivae are clear, pupils are regular  Neck:  Supple neck, JVD present  Cardiovascular:  Tachycardia, narrow complex on the monitor, strong pulses, severe hypertension present  Pulmonary/Chest:  Severe decreased lung sounds bilaterally, with mechanical ventilation the patient is moving very little air, some rales present in all lung fields  Abdominal:  Obese, soft, no masses  Musculoskeletal:  Intraosseous line present in the left humerus proximal, no peripheral edema bilaterally  Neurological:  Unresponsive, GCS of 3  Skin:  Dry    ED Course  OG placement Date/Time: 08/10/2015 6:26 PM Performed by: Noemi Chapel Authorized by: Noemi Chapel Consent: The procedure was performed in an emergent situation. Required items: required blood products, implants, devices, and special equipment available Patient identity confirmed: arm band Time out: Immediately prior to procedure a "time out" was called to verify the correct patient, procedure, equipment, support staff and site/side marked as required. Preparation: Patient was prepped and draped in the usual sterile  fashion. Local anesthesia used: no Patient sedated: no Patient tolerance: Patient tolerated the procedure well with no immediate complications Comments: Xray confirmed position   (including critical care time) Labs Review Labs Reviewed  BLOOD GAS, ARTERIAL - Abnormal; Notable for the following:    pH, Arterial 7.294 (*)    pO2, Arterial 154.0 (*)    Acid-base deficit 4.6 (*)    All other components within normal limits  COMPREHENSIVE METABOLIC PANEL - Abnormal; Notable for the following:    Sodium 120 (*)    Potassium 3.2 (*)    Chloride 81 (*)    CO2 20 (*)    Glucose, Bld 188 (*)    Calcium 8.1 (*)    Albumin 3.0 (*)    AST 123 (*)    ALT 78 (*)    Total Bilirubin 1.9 (*)    Anion gap 19 (*)    All other components within normal limits  CBC WITH DIFFERENTIAL/PLATELET - Abnormal; Notable for the following:    WBC 17.0 (*)  RBC 5.73 (*)    Hemoglobin 15.4 (*)    HCT 47.8 (*)    RDW 16.9 (*)    Neutro Abs 14.4 (*)    Monocytes Absolute 1.3 (*)    All other components within normal limits  URINALYSIS, ROUTINE W REFLEX MICROSCOPIC (NOT AT [facility]) - Abnormal; Notable for the following:    APPearance CLOUDY (*)    Specific Gravity, Urine >1.030 (*)    Glucose, UA 100 (*)    Hgb urine dipstick LARGE (*)    Bilirubin Urine SMALL (*)    Protein, ur >300 (*)    Urobilinogen, UA 2.0 (*)    All other components within normal limits  BRAIN NATRIURETIC PEPTIDE - Abnormal; Notable for the following:    B Natriuretic Peptide 472.0 (*)    All other components within normal limits  TROPONIN I - Abnormal; Notable for the following:    Troponin I 0.05 (*)    All other components within normal limits  URINE MICROSCOPIC-ADD ON - Abnormal; Notable for the following:    Squamous Epithelial / LPF FEW (*)    Bacteria, UA MANY (*)    All other components within normal limits  I-STAT CG4 LACTIC ACID, ED - Abnormal; Notable for the following:    Lactic Acid, Venous 8.65 (*)    All other  components within normal limits  CULTURE, BLOOD (ROUTINE X 2)  CULTURE, BLOOD (ROUTINE X 2)  URINE CULTURE    Imaging Review Dg Chest Portable 1 View  07/30/2015  CLINICAL DATA:  Patient presented to the emergency department with respiratory distress and had cardiorespiratory arrest requiring CPR. Intubation. Nasogastric tube placement. Central venous catheter placement. EXAM: PORTABLE CHEST 1 VIEW COMPARISON:  07/10/2015 and earlier. FINDINGS: Endotracheal tube tip in satisfactory position projecting approximately 3 cm above the carina. Nasogastric tube tip courses below the diaphragm into the stomach. Right jugular central venous catheter tip projects over the mid SVC or possibly the azygos vein. External pacing pads. Cardiac silhouette mildly to moderately enlarged. Interstitial and asymmetric perihilar airspace pulmonary edema, right greater than left. No visible pleural effusions. No pneumothorax. Surgical clips in the right axilla from prior node dissection. Prior right mastectomy. IMPRESSION: 1. Endotracheal tube tip in satisfactory position approximately 3 cm above the carina. 2. Nasogastric tube tip below the diaphragm. 3. Right jugular central venous catheter tip projects either over the mid SVC or the azygos vein. 4. CHF and/or fluid overload with diffuse interstitial and asymmetric airspace pulmonary edema, right greater than left. Electronically Signed   By: Evangeline Dakin M.D.   On: 08/14/2015 18:42   I have personally reviewed and evaluated these images and lab results as part of my medical decision-making.   EKG Interpretation   Date/Time:  Wednesday July 17 2015 17:54:15 EDT Ventricular Rate:  112 PR Interval:  171 QRS Duration: 81 QT Interval:  335 QTC Calculation: 457 R Axis:   133 Text Interpretation:  Sinus tachycardia Atrial premature complex Probable  left atrial enlargement Anteroseptal infarct, age indeterminate Minimal ST  elevation, inferior leads Since last  tracing rate faster Rightward axis -  consider limb lead reversal Confirmed by Denton Derks  MD, Bellamy Judson ([id number]) on  08/06/2015 6:22:58 PM      MDM   Final diagnoses:  Respiratory arrest ([place])  Acute pulmonary edema ([place])    Due to the patient's severe respiratory distress and the need for secure airway I replaced the [facility] airway with a endotracheal tube, 7.5 cm, 23 at  the lips on the first attempt with a direct laryngoscopy, see the note below. The patient had no IV access, she was restricted in her right upper extremity secondary to the mastectomy, I placed an internal jugular central venous catheter, see note below. The patient's airway has been secured, she is now oxygenating well, she has ongoing mild tachycardia, the source of the respiratory arrest is unclear, would consider CHF, COPD or pulmonary embolism given her history of cancer. The patient is critically ill, she will need intensive care unit, we'll order labs and CT scan at this time.  D/w Dr. Janann Colonel with [facility] - he will accept pt - Dr. Juanetta Gosling name to be on accepting, pt is now hypotensive and requiring fluids and vasopressors.  Levophed titrated - the pt has hx of increased swellinhg over the last week according to family who has now arrived, lasix given.  CRITICAL CARE Performed by: Johnna Acosta Total critical care time: 35  minutes Critical care time was exclusive of separately billable procedures and treating other patients. Critical care was necessary to treat or prevent imminent or life-threatening deterioration. Critical care was time spent personally by me on the following activities: development of treatment plan with patient and/or surrogate as well as nursing, discussions with consultants, evaluation of patient's response to treatment, examination of patient, obtaining history from patient or surrogate, ordering and performing treatments and interventions, ordering and review of laboratory studies, ordering and review of  radiographic studies, pulse oximetry and re-evaluation of patient's condition.  INTUBATION Performed by: Johnna Acosta  Required items: required blood products, implants, devices, and special equipment available Patient identity confirmed: provided demographic data and hospital-assigned identification number Time out: Immediately prior to procedure a "time out" was called to verify the correct patient, procedure, equipment, support staff and site/side marked as required.  Indications: respiratory arrest  Intubation method: direct Laryngoscopy   Preoxygenation: BVM  Sedatives: None  Paralytic: None  Tube Size: 7.5 cuffed  Post-procedure assessment: chest rise and ETCO2 monitor Breath sounds: equal and absent over the epigastrium Tube secured with: ETT holder Chest x-ray interpreted by radiologist and me.  Chest x-ray findings: endotracheal tube in appropriate position  Patient tolerated the procedure well with no immediate complications.   CENTRAL LINE Performed by: Noemi Chapel D Consent: The procedure was performed in an emergent situation. Required items: required blood products, implants, devices, and special equipment available Patient identity confirmed: arm band and provided demographic data Time out: Immediately prior to procedure a "time out" was called to verify the correct patient, procedure, equipment, support staff and site/side marked as required. Indications: vascular access Anesthesia: local infiltration Local anesthetic: lidocaine 1% with epinephrine Anesthetic total: 3 ml Patient sedated: no Preparation: skin prepped with 2% chlorhexidine Skin prep agent dried: skin prep agent completely dried prior to procedure Sterile barriers: all five maximum sterile barriers used - cap, mask, sterile gown, sterile gloves, and large sterile sheet Hand hygiene: hand hygiene performed prior to central venous catheter insertion  Location details: Right  IJ  Catheter  type: triple lumen Catheter size: 7.5 Fr Pre-procedure: landmarks identified Ultrasound guidance: No Successful placement: yes Post-procedure: line sutured and dressing applied Assessment: blood return through all parts, free fluid flow, placement verified by x-ray and no pneumothorax on x-ray Patient tolerance: Patient tolerated the procedure well with no immediate complications.    Noemi Chapel, MD 07/20/2015 2022

## 2015-07-17 NOTE — ED Notes (Signed)
Critical Care called at [facility] for Dr. Sabra Heck via Karen Chafe.

## 2015-07-17 NOTE — ED Notes (Signed)
Family here to visit with pt.

## 2015-07-17 NOTE — ED Notes (Signed)
Called [facility] for Code Sepsis.

## 2015-07-17 NOTE — H&P (Signed)
PULMONARY / CRITICAL CARE MEDICINE   Name: Madison Todd MRN: [id number] DOB: [birth year]    ADMISSION DATE:  08/11/2015 CONSULTATION DATE:  08/14/2015  REFERRING MD :  Forestine Na EDP  CHIEF COMPLAINT:  Cardiac arrest  INITIAL PRESENTATION: 64 year old female with sudden onset shortness of breath, was severely dyspneic upon EMS arrival and suffered respiratory, then very brief PEA cardiac arrest in route to emergency department. Total downtime estimated at 1 minute. She was intubated, and had little neurologic activity post arrest. Transferred to [facility] for ICU admission and possible therapeutic hypothermia.  STUDIES:  CXR 11/2 >>> CHF, asymmetric edema R > L CTA chest 11/2 >>> CT head 11/2 >>> no acute findings, remote Rt medullary infarct  SIGNIFICANT EVENTS: 11/2 - taken to [facility] ED after brief PEA cardiac arrest > transferred to [facility] for consideration of therapeutic hypothermia.   HISTORY OF PRESENT ILLNESS:  64 year old female with past medical history as below, which includes breast cancer, diabetes, COPD, hypertension, hyperlipidemia, stroke, leukocytoclastic vasculitis. Breast cancer was initially diagnosed in 2007 she refused chemotherapy, but underwent radiation therapy, Herceptin and Aromasin. She had a right radical mastectomy, lumpectomy in the left. 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 07/24/2015 she developed acute onset shortness of breath. EMS was contacted, and upon their arrival they found her to be seated at the edge of her bed in a tripod position markedly dyspneic. She was unable to speak. In route to the emergency department she unfortunately suffered a respiratory arrest, then cardiac arrest. Edison Pace airway was placed she received 1 amp of epinephrine and 1 minute of CPR with return of circulation. Rhythm during 1 minute of downtime was PEA. Post cardiac arrest she was unresponsive. King airway was  replaced with endotracheal tube in emergency department. Family then arrived noted that she has had increased lower extremity edema over the past week. She was given Lasix in the ED. Right internal jugular central venous catheter was also placed by emergency room physician. She was transferred to [facility] for ICU admission and possible therapeutic hypothermia. PCCM to admit.   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   PAST SURGICAL HISTORY:  has past surgical history that includes Mastectomy, radical; Breast lumpectomy; Port-a-cath removal; Colonoscopy (07/20/2005); Colonoscopy (N/A, 10/05/2013); Flexible sigmoidoscopy (N/A, 10/11/2013); and Flexible sigmoidoscopy (N/A, 11/24/2013).   Prior to Admission medications   Medication Sig Start Date End Date Taking? Authorizing Provider  acetaminophen (TYLENOL) 325 MG tablet Take 325 mg by mouth at bedtime as needed (help relax, sleep).     Historical Provider, MD  amLODipine (NORVASC) 10 MG tablet Take 1 tablet (10 mg total) by mouth daily. 10/23/14   Lavon Paganini Angiulli, PA-C  aspirin EC 81 MG tablet Take 81 mg by mouth daily.    Historical Provider, MD  cholecalciferol (VITAMIN D) 1000 UNITS tablet Take 1,000 Units by mouth daily.    Historical Provider, MD  gabapentin (NEURONTIN) 100 MG capsule Take 1 capsule (100 mg total) by mouth 2 (two) times daily. 05/27/15   Charlett Blake, MD  Linagliptin-Metformin HCl 2.01-999 MG TABS Take 1 tablet by mouth daily.    Historical Provider, MD  metoprolol  tartrate (LOPRESSOR) 25 MG tablet Take 0.5 tablets (12.5 mg total) by mouth 2 (two)  times daily. 10/23/14   Lavon Paganini Angiulli, PA-C   Allergies  Allergen Reactions  . Crestor [Rosuvastatin]     Leg cramps  . Lipitor [Atorvastatin]     Leg cramps   . Lisinopril Other (See Comments)    Tongue swelling  . Aromasin [Exemestane] Rash    FAMILY HISTORY:  indicated that her mother is deceased. She indicated that her father is deceased. She indicated that her maternal aunt is alive. She indicated that her paternal aunt is alive.    SOCIAL HISTORY:  reports that she has been smoking Cigarettes.  She has a 9 pack-year smoking history. She has never used smokeless tobacco. She reports that she does not drink alcohol or use illicit drugs.  REVIEW OF SYSTEMS:  Unable to obtain as pt is encephalopathic.  SUBJECTIVE:   VITAL SIGNS: Pulse Rate:  [81-85] 82 (11/02 1900) Resp:  [15] 15 (11/02 1900) BP: (64-160)/(45-98) 65/45 mmHg (11/02 1900) SpO2:  [100 %] 100 % (11/02 1900) FiO2 (%):  [100 %] 100 % (11/02 1823) HEMODYNAMICS:   VENTILATOR SETTINGS: Vent Mode:  [-] PRVC FiO2 (%):  [100 %] 100 % Set Rate:  [15 bmp] 15 bmp Vt Set:  [500 mL] 500 mL PEEP:  [5 cmH20] 5 cmH20 Plateau Pressure:  [18 cmH20] 18 cmH20 INTAKE / OUTPUT: No intake or output data in the 24 hours ending 07/20/2015 1922  PHYSICAL EXAMINATION: General: Adult AA female, in NAD. Neuro: Sedated, not following commands, non-responsive. HEENT: [place]/AT. PERRL, sclerae anicteric. Cardiovascular: RRR, no M/R/G.  Lungs: Respirations even and unlabored.  Coarse bilaterally with bibasilar crackles. Abdomen: BS x 4, soft, NT/ND.  Musculoskeletal: No gross deformities, 1+ non-pitting edema bilaterally. Skin: Intact, warm, no rashes.   LABS:  CBC  Recent Labs Lab 08/05/2015 1825  WBC 17.0*  HGB 15.4*  HCT 47.8*  PLT 308   Coag's No results for input(s): APTT, INR in the last 168 hours.   BMET  Recent Labs Lab 07/19/2015 1825  NA 120*  K 3.2*  CL 81*  CO2 20*  BUN 15  CREATININE 0.81  GLUCOSE  188*   Electrolytes  Recent Labs Lab 07/23/2015 1825  CALCIUM 8.1*   Sepsis Markers  Recent Labs Lab 08/02/2015 1826  LATICACIDVEN 8.65*   ABG  Recent Labs Lab 08/02/2015 1830  PHART 7.294*  PCO2ART 44.2  PO2ART 154.0*   Liver Enzymes  Recent Labs Lab 08/13/2015 1825  AST 123*  ALT 78*  ALKPHOS 107  BILITOT 1.9*  ALBUMIN 3.0*   Cardiac Enzymes  Recent Labs Lab 08/04/2015 1825  TROPONINI 0.05*   Glucose No results for input(s): GLUCAP in the last 168 hours.  Imaging Dg Chest Portable 1 View  07/21/2015  CLINICAL DATA:  Patient presented to the emergency department with respiratory distress and had cardiorespiratory arrest requiring CPR. Intubation. Nasogastric tube placement. Central venous catheter placement. EXAM: PORTABLE CHEST 1 VIEW COMPARISON:  07/10/2015 and earlier. FINDINGS: Endotracheal tube tip in satisfactory position projecting approximately 3 cm above the carina. Nasogastric tube tip courses below the diaphragm into the stomach. Right jugular central venous catheter tip projects over the mid SVC or possibly the azygos vein. External pacing pads. Cardiac silhouette mildly to moderately enlarged. Interstitial and asymmetric perihilar airspace pulmonary edema, right greater than left. No visible pleural effusions. No pneumothorax. Surgical clips in the right axilla from prior node dissection. Prior right mastectomy. IMPRESSION: 1.  Endotracheal tube tip in satisfactory position approximately 3 cm above the carina. 2. Nasogastric tube tip below the diaphragm. 3. Right jugular central venous catheter tip projects either over the mid SVC or the azygos vein. 4. CHF and/or fluid overload with diffuse interstitial and asymmetric airspace pulmonary edema, right greater than left. Electronically Signed   By: Evangeline Dakin M.D.   On: 07/22/2015 18:42     ASSESSMENT / PLAN:  CARDIOVASCULAR CVL RIJ 11/2 (placed in ED)>>> A:  Cardiac arrest in setting of respiratory  arrest. Elevated troponin. Acute on chronic CHF (Grade 1 DD on 09/2014 echo, LVEF 60%). H/o HTN, HLD. P:  Pressors to keep MAP > 65, Cardiology consulted F/u CVP, lactic acid, troponin, Echo Continue ASA Hold outpatient amlodipine, lopressor  PULMONARY ETT 11/2 >>> A: Acute respiratory failure 2nd to CAP, pulmonary edema. Hx of COPD. P:   Full vent support F/u CXR, ABG PRN albuterol  NEUROLOGIC A:  Acute anoxic/metabolic encephalopathy. P:   RASS goal -1 F/u EEG Hold outpt gabapentin Normothermia protocol  RENAL A:   Hyponatremia (hypervolemic). Hypokalemia. Lactic acidosis. P:   Check TSH, osmo, cortisol  GASTROINTESTINAL A:   Nutrition. P:   SUP:  Pantoprzole. Tube feeds if not able to extubate soon  HEMATOLOGIC / ONCOLOGIC A:   VTE prophylaxis. Hx breast CA - s/p radiation. P:  SCD's / Heparin F/u CBC  INFECTIOUS A:   CAP. P:   Day 1 rocephin, zithromax  Blood 11/02 >> Sputum 11/02 >> Influenza 11/02 >> Pneumococcal ag 11/02 >> Legionella ag 11/02 >>  ENDOCRINE A:  Hyperglycemia.   P:   Monitor blood sugars  - Inter-disciplinary family meet or Palliative Care meeting due by:  07/23/15.   Montey Hora, [place] [facility] Pager: [phone]  or [phone] 07/30/2015, 9:25 PM  Reviewed above and examined.  Hx from chart.  64 yo female brought to [facility] with respiratory distress.  Developed PEA arrest en route with brief time before ROSC.  Transferred to [facility].  Evaluated by cardiology.  CT chest showed infiltrate concerning for PNA.  Seen in ICU.  Sedated.  HR regular.  Scattered rhonchi.  Abd soft.  S/p Rt mastectomy.  No edema.  Labs show hyponatremia, hypokalemia, elevated bilirubin, lactic acidosis, hyperglycemia, leukocytosis.  Normothermia protocol.  Rocephin, zithromax.  Negative fluid balance.  F/u cx.  Monitor neuro status.  Updated pt's son.  D/w Dr. Marlou Porch.  CC time by me  independent of APP time is 46 minutes.  Chesley Mires, MD [facility] Pulmonary/Critical Care 07/18/2015, 12:01 AM Pager:  [phone] After 3pm call: [phone]

## 2015-07-17 NOTE — Code Documentation (Signed)
Dr. Sabra Heck preparing for central line

## 2015-07-17 NOTE — ED Notes (Signed)
[facility] started diprovan back at17mcg/kg

## 2015-07-17 NOTE — Code Documentation (Signed)
Dr. Sabra Heck intubated pt with size 7.5 ett and secured at 23cm at lip.  Positive color change on co2 detector and breath sounds ausculated.  Radiology called for portable cxr.

## 2015-07-17 NOTE — ED Notes (Signed)
Dr. Sabra Heck inserting OG.

## 2015-07-17 NOTE — Consult Note (Signed)
Admit date: 08/03/2015 Referring Physician  Dr. Sabra Heck Primary Physician Maggie Font, MD Primary Cardiologist  None  Reason for Consultation  PEA arrest  HPI: 64 year old female with diabetes, hypertension, hyperlipidemia, COPD, history of stroke, leukocytoclastic vasculitis with prior breast cancer in 2007 with normal ejection fraction on 09/28/14 echocardiogram with mild LVH who earlier today was describing sudden onset respiratory distress and EMS arrived to find her tripoding on the edge of her bed, unable to complete a full sentence, in route developed significant bradycardia and PEA arrest. Epinephrine was administered CPR was begun and return of spontaneous circulation was within 1-2 minutes however she was unresponsive following this arrest.  She's now currently in CCU for hypothermia protocol under the direction of critical care medicine. She was originally taken to [facility] emergency department after brief PEA arrest.  CT of head looking for bleed as well as CT of chest looking for any evidence of pulmonary embolism are currently pending. Chest x-ray shows what appears to be asymmetric edema right greater than left.    PMH:   Past Medical History  Diagnosis Date  . Leukocytoclastic vasculitis ([place])   . Diabetes mellitus   . Breast cancer ([place])     left 2007/right 1997  . Left-sided Breast cancer 07/22/2011  . Cardiomyopathy 07/22/2011  . COPD (chronic obstructive pulmonary disease) ([place]) 07/22/2011  . Hypertension   . Hyperlipidemia   . 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  Stage II (T2 N0).  ER 73%, PR 90%, Her2 positive, Ki-67 19%.  2.1 cm in size.  Agreed to only have radiation, Herceptin, and Aromasin.  Right sided breast cancer: Stage II (T2 N0 M0).  2.5 cm in size.  Surgery on 04/28/1996 with axillary dissection on 05/10/1996.  11 negative nodes.  Treated with AC x 4 cycles for ER negative, PR  positive at only 20%.    . Breast cancer, right breast ([place]) 12/16/2013  . Stroke [facility])     PSH:   Past Surgical History  Procedure Laterality Date  . Mastectomy, radical      right  . Breast lumpectomy      left  . Port-a-cath removal    . Colonoscopy  07/20/2005    RMR: Diminutive ascending colon polyp, status post resection . Inflamed focally adenomatous polyp  . Colonoscopy N/A 10/05/2013    RMR: Multiple rectal and colonic polyps-removed/treated. 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 other polyps tubular adenomas  . Flexible sigmoidoscopy N/A 10/11/2013    RMR: rectal polypectomy site identified, assitional resection and tatooing performed. 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 Flexible sigmoidoscopy N/A 11/24/2013    Procedure: FLEXIBLE SIGMOIDOSCOPY;  Surgeon: Daneil Dolin, MD;  Location: [facility] ENDO SU[facility];  Service: Endoscopy;  Laterality: N/A;  1:00-moved to [place] notified pt   Allergies:  Crestor; Lipitor; Lisinopril; and Aromasin Prior to Admit Meds:   Prior to Admission medications   Medication Sig Start Date End Date Taking? Authorizing Provider  amLODipine (NORVASC) 10 MG tablet Take 1 tablet (10 mg total) by mouth daily. Patient taking differently: Take 10 mg by mouth every other day.  10/23/14  Yes Daniel J Angiulli, PA-C  aspirin EC 81 MG tablet Take 81 mg by mouth daily.   Yes Historical Provider, MD  cholecalciferol (VITAMIN D) 1000 UNITS tablet Take 1,000 Units by mouth daily.   Yes Historical Provider,  MD  Insulin Degludec (TRESIBA FLEXTOUCH) 200 UNIT/ML SOPN Inject 200 Units into the skin daily.    Yes Historical Provider, MD  metoprolol tartrate (LOPRESSOR) 25 MG tablet Take 0.5 tablets (12.5 mg total) by mouth 2 (two) times daily. 10/23/14  Yes Daniel J Angiulli, PA-C  acetaminophen (TYLENOL) 325 MG tablet Take 325 mg by mouth at bedtime as needed (help relax, sleep).     Historical Provider, MD  gabapentin (NEURONTIN) 100 MG  capsule Take 1 capsule (100 mg total) by mouth 2 (two) times daily. Patient not taking: Reported on 08/09/2015 05/27/15   Charlett Blake, MD   Fam HX:    Family History  Problem Relation Age of Onset  . Diabetes Father   . Cancer Maternal Aunt   . Cancer Paternal Aunt   . Colon cancer Neg Hx    Social HX:    Social History   Social History  . Marital Status: Legally Separated    Spouse Name: N/A  . Number of Children: 3  . Years of Education: N/A   Occupational History  .     Social History Main Topics  . Smoking status: Current Every Day Smoker -- 0.50 packs/day for 18 years    Types: Cigarettes  . Smokeless tobacco: Never Used     Comment: smokes 12 cigarettes daily  . Alcohol Use: No  . Drug Use: No  . Sexual Activity: No   Other Topics Concern  . Not on file   Social History Narrative     ROS:  Unable to currently sedate   Physical Exam: Blood pressure 88/62, pulse 77, temperature 97.3 F (36.3 C), resp. rate 15, weight 189 lb 13.1 oz (86.1 kg), SpO2 93 %.   General: Well developed, well nourished, unresponsive on ventilator Head: Pinpoint pupils   Normal cephalic and atramatic  Lungs:   Vent noise appreciated bilaterally, somewhat coarse bilaterally Heart:   HRRR S1 S2 Pulses are 2+ & equal. No murmur, rubs, gallops.  No carotid bruit. No JVD, thick neck  No abdominal bruits.  Abdomen: Bowel sounds are positive, abdomen soft and non-tender without masses. No hepatosplenomegaly. Obese Msk:  Back normal. Normal strength and tone for age. Extremities:  No clubbing, cyanosis or 1+ edema.  DP +1 Neuro: Sedate GU: Deferred Rectal: Deferred Psych: Sedate      Labs: Lab Results  Component Value Date   WBC 17.0* 07/22/2015   HGB 15.4* 08/06/2015   HCT 47.8* 07/25/2015   MCV 83.4 07/21/2015   PLT 308 07/18/2015     Recent Labs Lab 07/21/2015 1825  NA 120*  K 3.2*  CL 81*  CO2 20*  BUN 15  CREATININE 0.81  CALCIUM 8.1*  PROT 6.9  BILITOT 1.9*   ALKPHOS 107  ALT 78*  AST 123*  GLUCOSE 188*    Recent Labs  07/16/2015 1825  TROPONINI 0.05*   Lab Results  Component Value Date   CHOL 184 09/28/2014   HDL 36* 09/28/2014   LDLCALC 136* 09/28/2014   TRIG 62 09/28/2014   No results found for: DDIMER   Radiology:  Dg Chest Portable 1 View  08/02/2015  CLINICAL DATA:  Patient presented to the emergency department with respiratory distress and had cardiorespiratory arrest requiring CPR. Intubation. Nasogastric tube placement. Central venous catheter placement. EXAM: PORTABLE CHEST 1 VIEW COMPARISON:  07/10/2015 and earlier. FINDINGS: Endotracheal tube tip in satisfactory position projecting approximately 3 cm above the carina. Nasogastric tube tip courses below the diaphragm into  the stomach. Right jugular central venous catheter tip projects over the mid SVC or possibly the azygos vein. External pacing pads. Cardiac silhouette mildly to moderately enlarged. Interstitial and asymmetric perihilar airspace pulmonary edema, right greater than left. No visible pleural effusions. No pneumothorax. Surgical clips in the right axilla from prior node dissection. Prior right mastectomy. IMPRESSION: 1. Endotracheal tube tip in satisfactory position approximately 3 cm above the carina. 2. Nasogastric tube tip below the diaphragm. 3. Right jugular central venous catheter tip projects either over the mid SVC or the azygos vein. 4. CHF and/or fluid overload with diffuse interstitial and asymmetric airspace pulmonary edema, right greater than left. Electronically Signed   By: Evangeline Dakin M.D.   On: 07/21/2015 18:42   Personally viewed.  EKG:  Right atrial enlargement noted, sinus rhythm, no ST segment changes, poor R-wave progression. Personally viewed.   Echocardiogram pending  ASSESSMENT/PLAN:    64 year old female with COPD, prior stroke with sudden onset dyspnea, severe respiratory distress and brief PEA arrest 1-2 minutes, subsequently  unresponsive here for hypothermia protocol.  PEA arrest  - There is no evidence of ST elevation on EKG  - Right atrial enlargement noted on EKG which may be indicative of elevated pressures. This seems more prominent on current EKG when compared to prior EKG personally viewed. I'm glad that pulmonary and was in his being excluded given the clinical situation.  - Historically situation points towards sudden respiratory failure leading to pulseless electrical activity. Recent ejection fraction in the setting of stroke in January was normal. Mild LVH noted, no evidence of hypertrophic cardiomyopathy. No prior cardiovascular history.  - Low level troponin elevation likely demand ischemia in the setting of respiratory arrest. This does portend a worse prognosis.  - Obtaining echocardiogram  Shock  - Likely exacerbated by hypothermia  - Low-dose levophed per primary team  Severe hyponatremia  - 120. Unusual.  - Could this be playing a role in her unresponsive state currently?  - Per primary team.  - Lasix is being administered.  Critical care time 40 minutes spent with patient, examination data, discussion with critical care team and nursing. Multisystem organ failure currently present. Grave prognosis.    Candee Furbish, MD  08/01/2015  9:54 PM

## 2015-07-17 NOTE — Code Documentation (Signed)
Per EMS was called out for SOB.  Son told ems thought she had another stroke.  EMS reports pt tripod position when they arrived.  cb 299.  Pt stopped breathing in ambulance and was pea rate of 40.  EMS started CPR.  EMS administered epi via IO.  CPR continued for 1 min and had pulses.  HR 120.

## 2015-07-17 NOTE — ED Notes (Addendum)
Respiratory therapy pulled tube back 1cm per Dr. Ammie Ferrier request after seeing cxr.  Tube secured at 22cm at lip.

## 2015-07-18 ENCOUNTER — Inpatient Hospital Stay (HOSPITAL_COMMUNITY): Payer: Commercial Managed Care - HMO

## 2015-07-18 DIAGNOSIS — J189 Pneumonia, unspecified organism: Secondary | ICD-10-CM | POA: Insufficient documentation

## 2015-07-18 DIAGNOSIS — J810 Acute pulmonary edema: Secondary | ICD-10-CM

## 2015-07-18 DIAGNOSIS — J9690 Respiratory failure, unspecified, unspecified whether with hypoxia or hypercapnia: Secondary | ICD-10-CM | POA: Insufficient documentation

## 2015-07-18 DIAGNOSIS — I469 Cardiac arrest, cause unspecified: Secondary | ICD-10-CM | POA: Insufficient documentation

## 2015-07-18 DIAGNOSIS — J449 Chronic obstructive pulmonary disease, unspecified: Secondary | ICD-10-CM

## 2015-07-18 LAB — CORTISOL: CORTISOL PLASMA: 35.7 ug/dL

## 2015-07-18 LAB — BASIC METABOLIC PANEL
ANION GAP: 12 (ref 5–15)
BUN: 13 mg/dL (ref 6–20)
CO2: 26 mmol/L (ref 22–32)
Calcium: 8.2 mg/dL — ABNORMAL LOW (ref 8.9–10.3)
Chloride: 89 mmol/L — ABNORMAL LOW (ref 101–111)
Creatinine, Ser: 0.79 mg/dL (ref 0.44–1.00)
GLUCOSE: 241 mg/dL — AB (ref 65–99)
POTASSIUM: 4.9 mmol/L (ref 3.5–5.1)
Sodium: 127 mmol/L — ABNORMAL LOW (ref 135–145)

## 2015-07-18 LAB — BLOOD GAS, ARTERIAL
ACID-BASE EXCESS: 0.6 mmol/L (ref 0.0–2.0)
Bicarbonate: 25.7 mEq/L — ABNORMAL HIGH (ref 20.0–24.0)
DRAWN BY: 313941
FIO2: 0.7
O2 SAT: 93.3 %
PCO2 ART: 48.4 mmHg — AB (ref 35.0–45.0)
PEEP: 8 cmH2O
PH ART: 7.344 — AB (ref 7.350–7.450)
Patient temperature: 98.6
RATE: 16 resp/min
TCO2: 27.2 mmol/L (ref 0–100)
VT: 510 mL
pO2, Arterial: 77 mmHg — ABNORMAL LOW (ref 80.0–100.0)

## 2015-07-18 LAB — GLUCOSE, CAPILLARY
GLUCOSE-CAPILLARY: 245 mg/dL — AB (ref 65–99)
GLUCOSE-CAPILLARY: 244 mg/dL — AB (ref 65–99)
GLUCOSE-CAPILLARY: 96 mg/dL (ref 65–99)
GLUCOSE-CAPILLARY: 77 mg/dL (ref 65–99)
GLUCOSE-CAPILLARY: 84 mg/dL (ref 65–99)
Glucose-Capillary: 264 mg/dL — ABNORMAL HIGH (ref 65–99)
Glucose-Capillary: 227 mg/dL — ABNORMAL HIGH (ref 65–99)
Glucose-Capillary: 207 mg/dL — ABNORMAL HIGH (ref 65–99)
Glucose-Capillary: 146 mg/dL — ABNORMAL HIGH (ref 65–99)
Glucose-Capillary: 123 mg/dL — ABNORMAL HIGH (ref 65–99)
Glucose-Capillary: 92 mg/dL (ref 65–99)
Glucose-Capillary: 122 mg/dL — ABNORMAL HIGH (ref 65–99)
Glucose-Capillary: 110 mg/dL — ABNORMAL HIGH (ref 65–99)
Glucose-Capillary: 78 mg/dL (ref 65–99)
Glucose-Capillary: 89 mg/dL (ref 65–99)

## 2015-07-18 LAB — LACTIC ACID, PLASMA: Lactic Acid, Venous: 2.9 mmol/L (ref 0.5–2.0)

## 2015-07-18 LAB — CBC
HEMATOCRIT: 50.5 % — AB (ref 36.0–46.0)
HEMOGLOBIN: 16.6 g/dL — AB (ref 12.0–15.0)
MCH: 26.6 pg (ref 26.0–34.0)
MCHC: 32.9 g/dL (ref 30.0–36.0)
MCV: 81.1 fL (ref 78.0–100.0)
Platelets: 335 10*3/uL (ref 150–400)
RBC: 6.23 MIL/uL — ABNORMAL HIGH (ref 3.87–5.11)
RDW: 17.4 % — ABNORMAL HIGH (ref 11.5–15.5)
WBC: 29 10*3/uL — ABNORMAL HIGH (ref 4.0–10.5)

## 2015-07-18 LAB — INFLUENZA PANEL BY PCR (TYPE A & B)
H1N1FLUPCR: NOT DETECTED
Influenza A By PCR: NEGATIVE
Influenza B By PCR: NEGATIVE

## 2015-07-18 LAB — RENAL FUNCTION PANEL
ALBUMIN: 2.5 g/dL — AB (ref 3.5–5.0)
ANION GAP: 11 (ref 5–15)
BUN: 16 mg/dL (ref 6–20)
CALCIUM: 7.9 mg/dL — AB (ref 8.9–10.3)
CO2: 27 mmol/L (ref 22–32)
Chloride: 89 mmol/L — ABNORMAL LOW (ref 101–111)
Creatinine, Ser: 0.8 mg/dL (ref 0.44–1.00)
Glucose, Bld: 98 mg/dL (ref 65–99)
PHOSPHORUS: 4.5 mg/dL (ref 2.5–4.6)
Potassium: 3.8 mmol/L (ref 3.5–5.1)
SODIUM: 127 mmol/L — AB (ref 135–145)

## 2015-07-18 LAB — OSMOLALITY: Osmolality: 268 mOsm/kg — ABNORMAL LOW (ref 275–300)

## 2015-07-18 LAB — PHOSPHORUS: PHOSPHORUS: 2.8 mg/dL (ref 2.5–4.6)

## 2015-07-18 LAB — POCT I-STAT 3, ART BLOOD GAS (G3+)
ACID-BASE EXCESS: 4 mmol/L — AB (ref 0.0–2.0)
BICARBONATE: 27.5 meq/L — AB (ref 20.0–24.0)
O2 SAT: 82 %
PCO2 ART: 34.8 mmHg — AB (ref 35.0–45.0)
PH ART: 7.503 — AB (ref 7.350–7.450)
PO2 ART: 40 mmHg — AB (ref 80.0–100.0)
Patient temperature: 97
TCO2: 29 mmol/L (ref 0–100)

## 2015-07-18 LAB — MAGNESIUM
Magnesium: 1.3 mg/dL — ABNORMAL LOW (ref 1.7–2.4)
Magnesium: 1.3 mg/dL — ABNORMAL LOW (ref 1.7–2.4)
Magnesium: 3 mg/dL — ABNORMAL HIGH (ref 1.7–2.4)

## 2015-07-18 LAB — PROTIME-INR
INR: 1.26 (ref 0.00–1.49)
PROTHROMBIN TIME: 16 s — AB (ref 11.6–15.2)

## 2015-07-18 LAB — TSH: TSH: 1.362 u[IU]/mL (ref 0.350–4.500)

## 2015-07-18 LAB — STREP PNEUMONIAE URINARY ANTIGEN
STREP PNEUMO URINARY ANTIGEN: NEGATIVE
STREP PNEUMO URINARY ANTIGEN: NEGATIVE

## 2015-07-18 LAB — PROCALCITONIN: PROCALCITONIN: 13.11 ng/mL

## 2015-07-18 MED ORDER — IPRATROPIUM-ALBUTEROL 0.5-2.5 (3) MG/3ML IN SOLN
3.0000 mL | Freq: Four times a day (QID) | RESPIRATORY_TRACT | Status: DC
  Administered 2015-07-18 – 2015-07-26 (×32): 3 mL via RESPIRATORY_TRACT
  Filled 2015-07-18 (×31): qty 3

## 2015-07-18 MED ORDER — DEXTROSE 5 % IV SOLN
INTRAVENOUS | Status: DC
  Administered 2015-07-18 (×2): via INTRAVENOUS

## 2015-07-18 MED ORDER — ONDANSETRON HCL 4 MG/2ML IJ SOLN: 4.0000 mg | Freq: Three times a day (TID) | INTRAMUSCULAR | Status: AC | PRN

## 2015-07-18 MED ORDER — FUROSEMIDE 10 MG/ML IJ SOLN
40.0000 mg | INTRAMUSCULAR | Status: AC
  Administered 2015-07-18: 40 mg via INTRAVENOUS
  Filled 2015-07-18: qty 4

## 2015-07-18 MED ORDER — FENTANYL BOLUS VIA INFUSION
25.0000 ug | INTRAVENOUS | Status: DC | PRN
  Administered 2015-07-18 (×3): 25 ug via INTRAVENOUS
  Filled 2015-07-18: qty 25

## 2015-07-18 MED ORDER — SODIUM CHLORIDE 0.9 % IV SOLN
25.0000 ug/h | INTRAVENOUS | Status: DC
  Administered 2015-07-18: 25 ug/h via INTRAVENOUS
  Filled 2015-07-18 (×2): qty 50

## 2015-07-18 MED ORDER — CHLORHEXIDINE GLUCONATE 0.12% ORAL RINSE (MEDLINE KIT)
15.0000 mL | Freq: Two times a day (BID) | OROMUCOSAL | Status: DC
  Administered 2015-07-18 – 2015-07-26 (×17): 15 mL via OROMUCOSAL

## 2015-07-18 MED ORDER — INSULIN ASPART 100 UNIT/ML ~~LOC~~ SOLN
0.0000 [IU] | SUBCUTANEOUS | Status: DC
  Administered 2015-07-19 – 2015-07-20 (×2): 1 [IU] via SUBCUTANEOUS
  Administered 2015-07-20 (×2): 2 [IU] via SUBCUTANEOUS
  Administered 2015-07-20: 1 [IU] via SUBCUTANEOUS
  Administered 2015-07-20: 5 [IU] via SUBCUTANEOUS
  Administered 2015-07-20: 3 [IU] via SUBCUTANEOUS
  Administered 2015-07-20 – 2015-07-21 (×5): 2 [IU] via SUBCUTANEOUS
  Administered 2015-07-21 – 2015-07-22 (×2): 3 [IU] via SUBCUTANEOUS
  Administered 2015-07-22 – 2015-07-23 (×10): 2 [IU] via SUBCUTANEOUS
  Administered 2015-07-23 (×2): 3 [IU] via SUBCUTANEOUS
  Administered 2015-07-24 (×3): 2 [IU] via SUBCUTANEOUS
  Administered 2015-07-24 (×2): 3 [IU] via SUBCUTANEOUS
  Administered 2015-07-24: 2 [IU] via SUBCUTANEOUS
  Administered 2015-07-25: 3 [IU] via SUBCUTANEOUS
  Administered 2015-07-25: 2 [IU] via SUBCUTANEOUS
  Administered 2015-07-25 – 2015-07-26 (×6): 3 [IU] via SUBCUTANEOUS

## 2015-07-18 MED ORDER — MAGNESIUM SULFATE 2 GM/50ML IV SOLN
2.0000 g | Freq: Once | INTRAVENOUS | Status: AC
  Administered 2015-07-18: 2 g via INTRAVENOUS
  Filled 2015-07-18: qty 50

## 2015-07-18 MED ORDER — FENTANYL CITRATE (PF) 100 MCG/2ML IJ SOLN
50.0000 ug | Freq: Once | INTRAMUSCULAR | Status: AC
  Administered 2015-07-18: 50 ug via INTRAVENOUS

## 2015-07-18 MED ORDER — ANTISEPTIC ORAL RINSE SOLUTION (CORINZ)
7.0000 mL | OROMUCOSAL | Status: DC
  Administered 2015-07-18 – 2015-07-26 (×81): 7 mL via OROMUCOSAL

## 2015-07-18 MED ORDER — INSULIN ASPART 100 UNIT/ML ~~LOC~~ SOLN: 2.0000 [IU] | SUBCUTANEOUS | Status: DC

## 2015-07-18 MED ORDER — SODIUM CHLORIDE 0.9 % IV SOLN
INTRAVENOUS | Status: DC
  Administered 2015-07-18: 1.9 [IU]/h via INTRAVENOUS
  Filled 2015-07-18: qty 2.5

## 2015-07-18 MED ORDER — IOHEXOL 350 MG/ML SOLN: 100.0000 mL | Freq: Once | INTRAVENOUS | Status: DC | PRN

## 2015-07-18 NOTE — Progress Notes (Signed)
Sputum sample obtained and sent to lab by RT. 

## 2015-07-18 NOTE — Progress Notes (Signed)
Called respiratory because pt O2 level dropped to the mid to low 80's. RN changed O2 probe. Respiratory had previously attemped to insert bite block, but was unsuccessful. RN increased O2 on vent to 100% (previously 70%). RN called Elink MD and made aware of pt's jaw clinching and the need for increased pain medications. MD ordered fentanyl gtt. Will continue to monitor.

## 2015-07-18 NOTE — Progress Notes (Signed)
Pt originally ordered to be 33 Hypothermia, but changed to 36 Hypothermia. RN had already spiked and gotten ready nimbex, fentanyl, and versed to hang for pt. MD changed orders so RN Verlin Grills wasted in sink full bags of medications including the following: 234ml fentanyl, 264ml nimbex, 71ml versed, and also 21ml propofol that was hanging and on when pt arrived to unit. RN Thurmond Butts witnessed waste in sink.

## 2015-07-18 NOTE — Progress Notes (Signed)
Echocardiogram 2D Echocardiogram has been performed.  Madison Todd 07/18/2015, 3:16 PM

## 2015-07-18 NOTE — Progress Notes (Addendum)
D5W running at 40 mL/hr. At 18:30 re-checked CBG from aline and result 89.  Paulene Floor 07/18/2015 6:39 PM

## 2015-07-18 NOTE — Progress Notes (Signed)
ETT retracted 3 cm per MD order. Tube secured at 20 at the lips.

## 2015-07-18 NOTE — Progress Notes (Signed)
Pt arrived to unit around 2100 without head CT. PT taken to CT at 2215. After CT read, Elink MD ordered 33 degree hypothermia to start at 2315. MD Sood arrived to unit at 2340 and changed order to 36 degree. RN started 36 degree per protocol. Will continue to monitor.

## 2015-07-18 NOTE — Procedures (Signed)
Arterial Catheter Insertion Procedure Note Madison Todd [id number] 11-15-1950  Procedure: Insertion of Arterial Catheter  Indications: Blood pressure monitoring  Procedure Details Consent: Risks of procedure as well as the alternatives and risks of each were explained to the (patient/caregiver).  Consent for procedure obtained. Time Out: Verified patient identification, verified procedure, site/side was marked, verified correct patient position, special equipment/implants available, medications/allergies/relevent history reviewed, required imaging and test results available.  Performed  Maximum sterile technique was used including antiseptics, cap, gloves, gown, hand hygiene, mask and sheet. Skin prep: Chlorhexidine; local anesthetic administered 20 gauge catheter was inserted into left radial artery using the Seldinger technique.  Evaluation Blood flow good; BP tracing good. Complications: No apparent complications.   [name], Dub Mikes 07/18/2015

## 2015-07-18 NOTE — Progress Notes (Signed)
PULMONARY / CRITICAL CARE MEDICINE   Name: Madison Todd MRN: [id number] DOB: February 26, 1951    ADMISSION DATE:  07/28/2015 CONSULTATION DATE:  07/21/2015  REFERRING MD :  Forestine Na EDP  CHIEF COMPLAINT:  Cardiac arrest  INITIAL PRESENTATION: 64 year old female with sudden onset shortness of breath, was severely dyspneic upon EMS arrival and suffered respiratory, then very brief PEA cardiac arrest in route to emergency department. Total downtime estimated at 1 minute. She was intubated, and had little neurologic activity post arrest. Transferred to [facility] for ICU admission and possible therapeutic hypothermia.  STUDIES:  CXR 11/2 - CHF, asymmetric edema R > L CT head 11/2 - no acute findings, remote Rt medullary infarct CTA chest 11/2 - tiny bilateral pleural effusions with lower lobe & left upper lobe atelectasis versus opacity. Mild interstitial edema.  SIGNIFICANT EVENTS: 11/2 - taken to [facility] ED after brief PEA cardiac arrest > transferred to [facility] for consideration of therapeutic hypothermia.  HISTORY OF PRESENT ILLNESS:  64 year old female with past medical history as below, which includes breast cancer, diabetes, COPD, hypertension, hyperlipidemia, stroke, leukocytoclastic vasculitis. Breast cancer was initially diagnosed in 2007 she refused chemotherapy, but underwent radiation therapy, Herceptin and Aromasin. She had a right radical mastectomy, lumpectomy in the left. 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 08/14/2015 she developed acute onset shortness of breath. EMS was contacted, and upon their arrival they found her to be seated at the edge of her bed in a tripod position markedly dyspneic. She was unable to speak. In route to the emergency department she unfortunately suffered a respiratory arrest, then cardiac arrest. Edison Pace airway was placed she received 1 amp of epinephrine and 1 minute of CPR with return of  circulation. Rhythm during 1 minute of downtime was PEA. Post cardiac arrest she was unresponsive. King airway was replaced with endotracheal tube in emergency department. Family then arrived noted that she has had increased lower extremity edema over the past week. She was given Lasix in the ED. Right internal jugular central venous catheter was also placed by emergency room physician. She was transferred to [facility] for ICU admission and possible therapeutic hypothermia. PCCM to admit.   SUBJECTIVE: Patient desaturated earlier this morning requiring an increase in FiO2 from 0.7-1.0. Patient had jaw clenching at the time and fentanyl drip was ordered.  REVIEW OF SYSTEMS:  Unable to obtain as patient is intubated & sedated.  VITAL SIGNS: Temp:  [95.4 F (35.2 C)-98.1 F (36.7 C)] 97 F (36.1 C) (11/03 1100) Pulse Rate:  [72-104] 84 (11/03 1132) Resp:  [8-24] 24 (11/03 1132) BP: (64-160)/(45-102) 154/102 mmHg (11/03 1132) SpO2:  [86 %-100 %] 92 % (11/03 1132) Arterial Line BP: (92-119)/(61-76) 99/74 mmHg (11/03 1100) FiO2 (%):  [70 %-100 %] 70 % (11/03 1132) Weight:  [189 lb 13.1 oz (86.1 kg)] 189 lb 13.1 oz (86.1 kg) (11/02 2100) HEMODYNAMICS: CVP:  [13 mmHg-23 mmHg] 23 mmHg VENTILATOR SETTINGS: Vent Mode:  [-] PRVC FiO2 (%):  [70 %-100 %] 70 % Set Rate:  [15 bmp-20 bmp] 20 bmp Vt Set:  [500 mL] 500 mL PEEP:  [5 cmH20-8 cmH20] 5 cmH20 Plateau Pressure:  [18 cmH20-32 cmH20] 32 cmH20 INTAKE / OUTPUT:  Intake/Output Summary (Last 24 hours) at 07/18/15 1217 Last data filed at 07/18/15 1100  Gross per 24 hour  Intake 1495.16 ml  Output   4330 ml  Net -2834.84 ml    PHYSICAL EXAMINATION:  General:  Sedated. No distress. Family at bedside.  Integument:  Warm & dry. No rash on exposed skin. Cooling pads in place. HEENT:  No scleral injection or icterus. Endotracheal tube in place.  Cardiovascular:  Regular rate. Edema. No appreciable JVD.  Pulmonary:  Symmetric chest wall  rise. Clear bilaterally with limited evaluation with pads in place. Abdomen: Soft. Normal bowel sounds. Nondistended.  Neurological:  Sedated. Symmetric brachioradialis DTRs. Forcefully closes eyes and clenches teeth.  LABS:  CBC  Recent Labs Lab 07/22/2015 1825 07/18/15 0906  WBC 17.0* 29.0*  HGB 15.4* 16.6*  HCT 47.8* 50.5*  PLT 308 335   Coag's  Recent Labs Lab 07/16/2015 2120 07/18/15 0440  APTT 31  --   INR 1.29 1.26     BMET  Recent Labs Lab 08/01/2015 1825 07/31/2015 2120 07/18/15 0440  NA 120* 126* 127*  K 3.2* 3.3* 4.9  CL 81* 86* 89*  CO2 20* 25 26  BUN 15 13 13   CREATININE 0.81 0.94 0.79  GLUCOSE 188* 200* 241*   Electrolytes  Recent Labs Lab 08/06/2015 1825 08/09/2015 2120 07/18/15 0100 07/18/15 0440  CALCIUM 8.1* 8.2*  --  8.2*  MG  --   --  1.3* 1.3*  PHOS  --   --   --  2.8   Sepsis Markers  Recent Labs Lab 08/04/2015 1826 07/18/15 0012  LATICACIDVEN 8.65* 2.9*   ABG  Recent Labs Lab 07/28/2015 1830 07/29/2015 2332 07/18/15 0257  PHART 7.294* 7.430 7.503*  PCO2ART 44.2 42.7 34.8*  PO2ART 154.0* 79.0* 40.0*   Liver Enzymes  Recent Labs Lab 07/19/2015 1825  AST 123*  ALT 78*  ALKPHOS 107  BILITOT 1.9*  ALBUMIN 3.0*   Cardiac Enzymes  Recent Labs Lab 07/31/2015 1825 08/07/2015 2120  TROPONINI 0.05* 0.11*   Glucose  Recent Labs Lab 07/18/15 0333 07/18/15 0437 07/18/15 0534 07/18/15 0637 07/18/15 0740 07/18/15 0903  GLUCAP 227* 244* 207* 146* 123* 92    Imaging Ct Head Wo Contrast  08/14/2015  CLINICAL DATA:  Respiratory arrest. EXAM: CT HEAD WITHOUT CONTRAST TECHNIQUE: Contiguous axial images were obtained from the base of the skull through the vertex without intravenous contrast. COMPARISON:  09/28/2014 FINDINGS: There is no intracranial hemorrhage, mass or evidence of acute infarction. There is encephalomalacia due to remote right medullary infarction. There is mild generalized atrophy. There is mild hemispheric  hypodensity in the white matter consistent with chronic small vessel disease, more prominent in the periventricular frontal white matter. There is no extra-axial fluid collection. No bone abnormality is evident. IMPRESSION: No acute intracranial findings. Mild atrophy and moderate chronic small vessel disease. Remote right medullary infarction. Electronically Signed   By: Andreas Newport M.D.   On: 08/01/2015 22:47   Ct Angio Chest Pe W/cm &/or Wo Cm  07/18/2015  CLINICAL DATA:  Short of breath EXAM: CT ANGIOGRAPHY CHEST WITH CONTRAST TECHNIQUE: Multidetector CT imaging of the chest was performed using the standard protocol during bolus administration of intravenous contrast. Multiplanar CT image reconstructions and MIPs were obtained to evaluate the vascular anatomy. CONTRAST:  100 mL Omnipaque 350 IV COMPARISON:  Chest x-ray 08/08/2015.  Chest CT 01/22/2007 FINDINGS: Negative for pulmonary embolism. Cardiac enlargement without pericardial effusion. The aorta is not well opacified. Mild atherosclerotic disease in the aortic arch without aneurysm. Small right pleural effusion. Dependent atelectasis in both lung bases. Left upper lobe atelectasis. Interstitial markings slightly prominent most consistent with interstitial edema. NG tube in the stomach. Endotracheal tube at the carina,  withdraw 3 cm. Negative for mass or adenopathy. Right mastectomy changes. No acute abnormality upper abdomen. Review of the MIP images confirms the above findings. IMPRESSION: Negative for pulmonary embolism Small right pleural effusion. Bibasilar atelectasis and left upper lobe atelectasis Probable mild interstitial edema suggesting fluid overload Endotracheal tube at the carina, withdraw 3 cm. Electronically Signed   By: Franchot Gallo M.D.   On: 07/18/2015 09:03   Dg Chest Port 1 View  07/18/2015  CLINICAL DATA:  Community acquired pneumonia EXAM: PORTABLE CHEST 1 VIEW COMPARISON:  07/19/2015 FINDINGS: ET tube tip is above the  carina. There is a a right IJ catheter with tip in the projection of the cavoatrial junction. OG tube tip is in the stomach. Heart size is mildly enlarged. There are bilateral pleural effusions and interstitial edema consistent with CHF. Similar appearance of lingular atelectasis and bibasilar airspace consolidation. IMPRESSION: 1. No change in CHF pattern. 2. Aeration to the lung bases unchanged. Electronically Signed   By: Kerby Moors M.D.   On: 07/18/2015 07:09   Dg Chest Portable 1 View  08/01/2015  CLINICAL DATA:  Patient presented to the emergency department with respiratory distress and had cardiorespiratory arrest requiring CPR. Intubation. Nasogastric tube placement. Central venous catheter placement. EXAM: PORTABLE CHEST 1 VIEW COMPARISON:  07/10/2015 and earlier. FINDINGS: Endotracheal tube tip in satisfactory position projecting approximately 3 cm above the carina. Nasogastric tube tip courses below the diaphragm into the stomach. Right jugular central venous catheter tip projects over the mid SVC or possibly the azygos vein. External pacing pads. Cardiac silhouette mildly to moderately enlarged. Interstitial and asymmetric perihilar airspace pulmonary edema, right greater than left. No visible pleural effusions. No pneumothorax. Surgical clips in the right axilla from prior node dissection. Prior right mastectomy. IMPRESSION: 1. Endotracheal tube tip in satisfactory position approximately 3 cm above the carina. 2. Nasogastric tube tip below the diaphragm. 3. Right jugular central venous catheter tip projects either over the mid SVC or the azygos vein. 4. CHF and/or fluid overload with diffuse interstitial and asymmetric airspace pulmonary edema, right greater than left. Electronically Signed   By: Evangeline Dakin M.D.   On: 08/06/2015 18:42     ASSESSMENT / PLAN:  CARDIOVASCULAR CVL RIJ 11/2 (placed in ED)>>> A:  Cardiac arrest in setting of respiratory arrest Elevated Troponin I Acute  on chronic CHF (Grade 1 DD on 09/2014 echo, LVEF 60%) H/O HTN & HLD  P:  Levophed to keep MAP > 65 Cardiology following TTE pending ASA 81mg  daily Hold outpatient amlodipine & lopressor given hypotension  PULMONARY OETT 11/2 >>> A: Acute Hypoxic Respiratory Failure - Due to CAP vs pulmonary edema CAP Respiratory Alkalosis - Likely due to overventilation.  H/O COPD  P:   PRVC 8cc/kg TV Decrease RR to 16 rpm & repeat ABG @ 1330 See ID section Duoneb q6hr PRN albuterol OETT withdrawn 3cm based on CTA this morning  NEUROLOGIC A:  Acute Encephalopathy - Toxic metabolic versus anoxic. H/O CVA  P:   RASS goal:  -1 to -2 Fentanyl gtt Versed IV prn F/u EEG Hold outpt gabapentin Normothermia protocol  RENAL A:   Hyponatremia - Hypervolemic. Improving. Hypomagnesemia - Treating IV. Hypokalemia - Resolved Lactic acidosis - Improving.  P:   Trending Lactic Acidosis daily Trending Electrolytes q12hr Magnesium Sulfate 2gm IV Repeat Magnesium level q12hr  GASTROINTESTINAL A:   No acute issues.  P:   Protonix IV daily NPO Holding on tube feeds for now  HEMATOLOGIC / ONCOLOGIC  A:   Leukocytosis - Likely secondary to sepsis. VTE prophylaxis H/O Breast CA - s/p radiation  P:  Trending leukocytosis with daily CBC SCDs Heparin [place] q8hr  INFECTIOUS A:   Sepsis - Likely  CAP  P:   PCT Algorithm  Azithromycin 11/3>> Rocephin 11/3>>  Respiratory Viral PCR 11/3>> Urine Strep Ag 11/3 >> Urine Legionella Ag 11/3 >> Trach Asp Ctx 11/3>>  Blood 11/02 >> Urine Ctx 11/02>>  ENDOCRINE A:  H/O DM  P:   Stop insulin drip Accuchecks q4hr Low dose SSI for coverage  FAMILY UPDATE: - Son updated at bedside on 11/3 by Dr. Ashok Cordia. - Inter-disciplinary family meet or Palliative Care meeting due by:  07/23/15.  TODAY'S SUMMARY:  Unfortunate 64 year old female with respiratory arrest precipitating cardiac arrest. Likely etiology is [facility] pneumonia.  Checking respiratory viral panel PCR as well as additional urine studies and cultures to identify potential etiology. Continuing broad-spectrum antibiotics. Patient continuing on normothermia protocol.  I have spent a total of 40 minutes "care time today caring for the patient, discussing the plan of care with the patient's family, & reviewing the patient's electronic medical record.  Sonia Baller Ashok Cordia, M.D. [facility] Pulmonary & Critical Care Pager:  [phone] After 3pm or if no response, call [phone]  07/18/2015, 12:17 PM

## 2015-07-18 NOTE — Consult Note (Signed)
Admit date: 08/03/2015 Referring Physician  Dr. Sabra Heck Primary Physician Maggie Font, MD Primary Cardiologist  None  Reason for Consultation  PEA arrest  HPI: 64 year old female with diabetes, hypertension, hyperlipidemia, COPD, history of stroke, leukocytoclastic vasculitis with prior breast cancer in 2007 with normal ejection fraction on 09/28/14 echocardiogram with mild LVH who earlier today was describing sudden onset respiratory distress and EMS arrived to find her tripoding on the edge of her bed, unable to complete a full sentence, in route developed significant bradycardia and PEA arrest. Epinephrine was administered CPR was begun and return of spontaneous circulation was within 1-2 minutes however she was unresponsive following this arrest.  She's now currently in CCU for hypothermia protocol under the direction of critical care medicine. She was originally taken to [facility] emergency department after brief PEA arrest.  Chest x-ray shows what appears to be asymmetric edema right greater than left.  Interval history: Remains intubated this AM though responds to noxious stimuli per RN. Head CT with no acute findings. CTA and echo pending this AM.   PMH:   Past Medical History  Diagnosis Date  . Leukocytoclastic vasculitis ([place])   . Diabetes mellitus   . Breast cancer ([place])     left 2007/right 1997  . Left-sided Breast cancer 07/22/2011  . Cardiomyopathy 07/22/2011  . COPD (chronic obstructive pulmonary disease) ([place]) 07/22/2011  . Hypertension   . Hyperlipidemia   . 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  Stage II (T2 N0).  ER 73%, PR 90%, Her2 positive, Ki-67 19%.  2.1 cm in size.  Agreed to only have radiation, Herceptin, and Aromasin.  Right sided breast cancer: Stage II (T2 N0 M0).  2.5 cm in size.  Surgery on 04/28/1996 with axillary dissection on 05/10/1996.  11 negative nodes.  Treated with AC x 4  cycles for ER negative, PR positive at only 20%.    . Breast cancer, right breast ([place]) 12/16/2013  . Stroke [facility])     PSH:   Past Surgical History  Procedure Laterality Date  . Mastectomy, radical      right  . Breast lumpectomy      left  . Port-a-cath removal    . Colonoscopy  07/20/2005    RMR: Diminutive ascending colon polyp, status post resection . Inflamed focally adenomatous polyp  . Colonoscopy N/A 10/05/2013    RMR: Multiple rectal and colonic polyps-removed/treated. 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 other polyps tubular adenomas  . Flexible sigmoidoscopy N/A 10/11/2013    RMR: rectal polypectomy site identified, assitional resection and tatooing performed. 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 Flexible sigmoidoscopy N/A 11/24/2013    Procedure: FLEXIBLE SIGMOIDOSCOPY;  Surgeon: Daneil Dolin, MD;  Location: [facility] ENDO SUITE;  Service: Endoscopy;  Laterality: N/A;  1:00-moved to [place] notified pt   Allergies:  Crestor; Lipitor; Lisinopril; and Aromasin Prior to Admit Meds:   Prior to Admission medications   Medication Sig Start Date End Date Taking? Authorizing Provider  amLODipine (NORVASC) 10 MG tablet Take 1 tablet (10 mg total) by mouth daily. Patient taking differently: Take 10 mg by mouth every other day.  10/23/14  Yes Daniel J Angiulli, PA-C  aspirin EC 81 MG tablet Take 81 mg by mouth daily.   Yes Historical Provider, MD  cholecalciferol (VITAMIN D) 1000 UNITS tablet Take 1,000 Units by mouth daily.  Yes Historical Provider, MD  Insulin Degludec (TRESIBA FLEXTOUCH) 200 UNIT/ML SOPN Inject 200 Units into the skin daily.    Yes Historical Provider, MD  metoprolol tartrate (LOPRESSOR) 25 MG tablet Take 0.5 tablets (12.5 mg total) by mouth 2 (two) times daily. 10/23/14  Yes Daniel J Angiulli, PA-C  acetaminophen (TYLENOL) 325 MG tablet Take 325 mg by mouth at bedtime as needed (help relax, sleep).     Historical Provider, MD    gabapentin (NEURONTIN) 100 MG capsule Take 1 capsule (100 mg total) by mouth 2 (two) times daily. Patient not taking: Reported on 08/01/2015 05/27/15   Charlett Blake, MD   Fam HX:    Family History  Problem Relation Age of Onset  . Diabetes Father   . Cancer Maternal Aunt   . Cancer Paternal Aunt   . Colon cancer Neg Hx    Social HX:    Social History   Social History  . Marital Status: Legally Separated    Spouse Name: N/A  . Number of Children: 3  . Years of Education: N/A   Occupational History  .     Social History Main Topics  . Smoking status: Current Every Day Smoker -- 0.50 packs/day for 18 years    Types: Cigarettes  . Smokeless tobacco: Never Used     Comment: smokes 12 cigarettes daily  . Alcohol Use: No  . Drug Use: No  . Sexual Activity: No   Other Topics Concern  . Not on file   Social History Narrative     ROS:  Unable to currently sedate   Physical Exam: Blood pressure 132/71, pulse 78, temperature 96.6 F (35.9 C), temperature source Core (Comment), resp. rate 21, height 5' 8"  (1.727 m), weight 189 lb 13.1 oz (86.1 kg), SpO2 94 %.   General: Well developed, well nourished, unresponsive on ventilator Head: Pinpoint pupils   Normal cephalic and atramatic  Lungs:   Vent noise appreciated bilaterally, somewhat coarse bilaterally Heart:   HRRR S1 S2 Pulses are 2+ & equal. No murmur, rubs, gallops.  No carotid bruit. No JVD, thick neck  No abdominal bruits.  Abdomen: Bowel sounds are positive, abdomen soft and non-tender without masses. No hepatosplenomegaly. Obese Msk:  Back normal. Normal strength and tone for age. Extremities:  2+ pitting edema bilaterally.  DP +1 Neuro: Sedate GU: Deferred Rectal: Deferred Psych: Sedate      Labs: Lab Results  Component Value Date   WBC 17.0* 08/01/2015   HGB 15.4* 07/16/2015   HCT 47.8* 08/08/2015   MCV 83.4 08/04/2015   PLT 308 08/04/2015     Recent Labs Lab 08/14/2015 1825  07/18/15 0440   NA 120*  < > 127*  K 3.2*  < > 4.9  CL 81*  < > 89*  CO2 20*  < > 26  BUN 15  < > 13  CREATININE 0.81  < > 0.79  CALCIUM 8.1*  < > 8.2*  PROT 6.9  --   --   BILITOT 1.9*  --   --   ALKPHOS 107  --   --   ALT 78*  --   --   AST 123*  --   --   GLUCOSE 188*  < > 241*  < > = values in this interval not displayed.  Recent Labs  08/10/2015 1825 07/30/2015 2120  TROPONINI 0.05* 0.11*   Lab Results  Component Value Date   CHOL 184 09/28/2014   HDL 36* 09/28/2014  [place] 136* 09/28/2014   TRIG 62 09/28/2014   No results found for: DDIMER   Radiology:  Ct Head Wo Contrast  08/07/2015  CLINICAL DATA:  Respiratory arrest. EXAM: CT HEAD WITHOUT CONTRAST TECHNIQUE: Contiguous axial images were obtained from the base of the skull through the vertex without intravenous contrast. COMPARISON:  09/28/2014 FINDINGS: There is no intracranial hemorrhage, mass or evidence of acute infarction. There is encephalomalacia due to remote right medullary infarction. There is mild generalized atrophy. There is mild hemispheric hypodensity in the white matter consistent with chronic small vessel disease, more prominent in the periventricular frontal white matter. There is no extra-axial fluid collection. No bone abnormality is evident. IMPRESSION: No acute intracranial findings. Mild atrophy and moderate chronic small vessel disease. Remote right medullary infarction. Electronically Signed   By: Andreas Newport M.D.   On: 07/31/2015 22:47   Dg Chest Port 1 View  07/18/2015  CLINICAL DATA:  Community acquired pneumonia EXAM: PORTABLE CHEST 1 VIEW COMPARISON:  07/16/2015 FINDINGS: ET tube tip is above the carina. There is a a right IJ catheter with tip in the projection of the cavoatrial junction. OG tube tip is in the stomach. Heart size is mildly enlarged. There are bilateral pleural effusions and interstitial edema consistent with CHF. Similar appearance of lingular atelectasis and bibasilar airspace  consolidation. IMPRESSION: 1. No change in CHF pattern. 2. Aeration to the lung bases unchanged. Electronically Signed   By: Kerby Moors M.D.   On: 07/18/2015 07:09   Dg Chest Portable 1 View  07/29/2015  CLINICAL DATA:  Patient presented to the emergency department with respiratory distress and had cardiorespiratory arrest requiring CPR. Intubation. Nasogastric tube placement. Central venous catheter placement. EXAM: PORTABLE CHEST 1 VIEW COMPARISON:  07/10/2015 and earlier. FINDINGS: Endotracheal tube tip in satisfactory position projecting approximately 3 cm above the carina. Nasogastric tube tip courses below the diaphragm into the stomach. Right jugular central venous catheter tip projects over the mid SVC or possibly the azygos vein. External pacing pads. Cardiac silhouette mildly to moderately enlarged. Interstitial and asymmetric perihilar airspace pulmonary edema, right greater than left. No visible pleural effusions. No pneumothorax. Surgical clips in the right axilla from prior node dissection. Prior right mastectomy. IMPRESSION: 1. Endotracheal tube tip in satisfactory position approximately 3 cm above the carina. 2. Nasogastric tube tip below the diaphragm. 3. Right jugular central venous catheter tip projects either over the mid SVC or the azygos vein. 4. CHF and/or fluid overload with diffuse interstitial and asymmetric airspace pulmonary edema, right greater than left. Electronically Signed   By: Evangeline Dakin M.D.   On: 07/20/2015 18:42   Personally viewed.  EKG 11/2:  Right atrial enlargement noted, sinus rhythm, no ST segment changes, poor R-wave progression. Personally viewed.   Echocardiogram pending  ASSESSMENT/PLAN:    64 year old female with COPD, prior stroke with sudden onset dyspnea, severe respiratory distress and brief PEA arrest 1-2 minutes, subsequently unresponsive here for hypothermia protocol.  PEA arrest: Possibly 2/2 hypoxia though cannot exclude thrombus +/-  acute CHF exacerbation given CXR findings with pulmonary edema and initial EKG findings suggestive of right atrial enlargement. Odd that echo findings in the setting of stroke in January was normal with mild LVH noted. No prior cardiovascular history. No evidence of ACS on initial EKG. Low level troponin elevation likely demand ischemia in the setting of respiratory arrest. This does portend a worse prognosis.  - Follow-up CTA to r/o PE  - Follow-up echo to reassess systolic function  -  Obtaining echocardiogram  Shock - Likely exacerbated by hypothermia - Low-dose levophed per primary team  Hypervolemic hyponatremia: Na 120 on admission though improved to 127 with Lasix IV. CXR with pulmonary edema. - Give Lasix 57m IV again - Follow-up echo to reassess EF  Hypomagnesemia: Mg 1.3. Give Mg 4g and recheck.  RCharlott Rakes PGY2 Internal Medicine Pager: [phone]

## 2015-07-18 NOTE — Progress Notes (Signed)
EEG completed, results pending. 

## 2015-07-18 NOTE — Progress Notes (Signed)
[facility] Progress Note Patient Name: Madison Todd DOB: Feb 23, 1951 MRN: [id number]   Date of Service  07/18/2015  HPI/Events of Note  Hypoglycemia - Blood glucose = 100 at 12 noon and insulin IV infusion turned off. Blood glucose at 4:30 PM = 77.   eICU Interventions  Will order: 1. D5W to run at 40 mL/hour. 2. Decrease 0.9 NaCl IV infusion to 75 mL/hour.      Intervention Category Intermediate Interventions: Other:  Lysle Dingwall 07/18/2015, 5:51 PM

## 2015-07-18 NOTE — Procedures (Signed)
ELECTROENCEPHALOGRAM REPORT  Patient: Madison Todd       Room #: 9H37 EEG No. ID: [id number] Age: 64 y.o.        Sex: female Referring Physician: Governor Rooks Report Date:  07/18/2015        Interpreting Physician: Anthony Sar  History: [name] is an 64 y.o. female history of breast cancer, diabetes mellitus, COPD, hypertension, hyperlipidemia and stroke admitted following PEA arrest with ROSC within 1 minute.  Indications for study:  Rule out encephalopathy.  Technique: This is an 18 channel routine scalp EEG performed at the bedside with bipolar and monopolar montages arranged in accordance to the international 10/20 system of electrode placement.   Description: Patient was intubated and on mechanical ventilation at the time of the study, as well as sedated with fentanyl. Predominant activity consisted of markedly low amplitude diffuse symmetrical delta activity with superimposed low amplitude 20-25 Hz beta activity which was most prominent centrally. Considerable muscle potential artifact occurred throughout the record. Photic stimulation was not performed. No epileptiform discharges were recorded.  Interpretation: This EEG is abnormal with markedly suppressed diffuse cerebral activity with generalized slowing which may be due to sedating medication. Encephalopathic state cannot be ruled out. No evidence of seizure activity was recorded. Recommend repeat EEG when patient is no longer sedated, to assess severity of encephalopathy if present.   Rush Farmer M.D. [facility] [phone]

## 2015-07-18 NOTE — Progress Notes (Addendum)
CBG of 78 from an arterial specimen at 16:45. Rechecked at 17:10 and CBG 77 from an arterial specimen. E-link made aware and currently awaiting orders for treatment.   Paulene Floor 07/18/2015  5:43 PM

## 2015-07-18 NOTE — Care Management Note (Signed)
Case Management Note  Patient Details  Name: Madison Todd MRN: [id number] Date of Birth: [birth year]  Subjective/Objective:         Adm w resp arrest,vent           Action/Plan: lives at home, pcp dr Berneta [place]   Expected Discharge Date:                  Expected Discharge Plan:     In-House Referral:     Discharge planning Services     Post Acute Care Choice:    Choice offered to:     DME Arranged:    DME Agency:     HH Arranged:    [place] Agency:     Status of Service:     Medicare Important Message Given:    Date Medicare IM Given:    Medicare IM give by:    Date Additional Medicare IM Given:    Additional Medicare Important Message give by:     If discussed at [place] of Stay Meetings, dates discussed:    Additional Comments: ur review done  Lacretia Leigh, RN 07/18/2015, 10:08 AM

## 2015-07-19 ENCOUNTER — Inpatient Hospital Stay (HOSPITAL_COMMUNITY): Payer: Commercial Managed Care - HMO

## 2015-07-19 DIAGNOSIS — J9600 Acute respiratory failure, unspecified whether with hypoxia or hypercapnia: Secondary | ICD-10-CM

## 2015-07-19 LAB — RENAL FUNCTION PANEL
ALBUMIN: 2.5 g/dL — AB (ref 3.5–5.0)
ANION GAP: 11 (ref 5–15)
Albumin: 2.4 g/dL — ABNORMAL LOW (ref 3.5–5.0)
Anion gap: 15 (ref 5–15)
BUN: 17 mg/dL (ref 6–20)
BUN: 13 mg/dL (ref 6–20)
CALCIUM: 8 mg/dL — AB (ref 8.9–10.3)
CHLORIDE: 91 mmol/L — AB (ref 101–111)
CO2: 26 mmol/L (ref 22–32)
CO2: 27 mmol/L (ref 22–32)
CREATININE: 0.66 mg/dL (ref 0.44–1.00)
CREATININE: 0.79 mg/dL (ref 0.44–1.00)
Calcium: 8.4 mg/dL — ABNORMAL LOW (ref 8.9–10.3)
Chloride: 91 mmol/L — ABNORMAL LOW (ref 101–111)
GFR calc Af Amer: >60 mL/min (ref >60)
GFR calc non Af Amer: >60 mL/min (ref >60)
GLUCOSE: 93 mg/dL (ref 65–99)
GLUCOSE: 107 mg/dL — AB (ref 65–99)
PHOSPHORUS: 4.9 mg/dL — AB (ref 2.5–4.6)
Phosphorus: 3.5 mg/dL (ref 2.5–4.6)
Potassium: 4.2 mmol/L (ref 3.5–5.1)
Potassium: 3.9 mmol/L (ref 3.5–5.1)
SODIUM: 128 mmol/L — AB (ref 135–145)
Sodium: 133 mmol/L — ABNORMAL LOW (ref 135–145)

## 2015-07-19 LAB — BLOOD GAS, ARTERIAL
Acid-Base Excess: 2 mmol/L (ref 0.0–2.0)
Bicarbonate: 26.5 mEq/L — ABNORMAL HIGH (ref 20.0–24.0)
Drawn by: 313941
FIO2: 0.5
LHR: 16 {breaths}/min
MECHVT: 510 mL
O2 Saturation: 94.1 %
PATIENT TEMPERATURE: 98.6
PCO2 ART: 44.3 mmHg (ref 35.0–45.0)
PEEP: 10 cmH2O
PO2 ART: 74.7 mmHg — AB (ref 80.0–100.0)
TCO2: 27.8 mmol/L (ref 0–100)
pH, Arterial: 7.394 (ref 7.350–7.450)

## 2015-07-19 LAB — CBC WITH DIFFERENTIAL/PLATELET
BASOS PCT: 0 %
Basophils Absolute: 0 10*3/uL (ref 0.0–0.1)
EOS ABS: 0 10*3/uL (ref 0.0–0.7)
EOS PCT: 0 %
HCT: 50.2 % — ABNORMAL HIGH (ref 36.0–46.0)
HEMOGLOBIN: 16.1 g/dL — AB (ref 12.0–15.0)
Lymphocytes Relative: 3 %
Lymphs Abs: 0.9 10*3/uL (ref 0.7–4.0)
MCH: 26.7 pg (ref 26.0–34.0)
MCHC: 32.1 g/dL (ref 30.0–36.0)
MCV: 83.4 fL (ref 78.0–100.0)
MONOS PCT: 6 %
Monocytes Absolute: 1.9 10*3/uL — ABNORMAL HIGH (ref 0.1–1.0)
NEUTROS PCT: 91 %
Neutro Abs: 29.5 10*3/uL — ABNORMAL HIGH (ref 1.7–7.7)
PLATELETS: 313 10*3/uL (ref 150–400)
RBC: 6.02 MIL/uL — ABNORMAL HIGH (ref 3.87–5.11)
RDW: 17.4 % — ABNORMAL HIGH (ref 11.5–15.5)
WBC: 32.4 10*3/uL — AB (ref 4.0–10.5)

## 2015-07-19 LAB — LEGIONELLA PNEUMOPHILA SEROGP 1 UR AG
L. PNEUMOPHILA SEROGP 1 UR AG: NEGATIVE
L. pneumophila Serogp 1 Ur Ag: NEGATIVE

## 2015-07-19 LAB — MAGNESIUM: Magnesium: 2.4 mg/dL (ref 1.7–2.4)

## 2015-07-19 LAB — GLUCOSE, CAPILLARY
GLUCOSE-CAPILLARY: 89 mg/dL (ref 65–99)
GLUCOSE-CAPILLARY: 105 mg/dL — AB (ref 65–99)
GLUCOSE-CAPILLARY: 103 mg/dL — AB (ref 65–99)
Glucose-Capillary: 125 mg/dL — ABNORMAL HIGH (ref 65–99)
Glucose-Capillary: 84 mg/dL (ref 65–99)
Glucose-Capillary: 94 mg/dL (ref 65–99)

## 2015-07-19 LAB — URINE CULTURE: Culture: NO GROWTH

## 2015-07-19 LAB — PROCALCITONIN: PROCALCITONIN: 8.19 ng/mL

## 2015-07-19 LAB — CALCIUM, IONIZED: CALCIUM, IONIZED, SERUM: 4.5 mg/dL (ref 4.5–5.6)

## 2015-07-19 LAB — LACTIC ACID, PLASMA: LACTIC ACID, VENOUS: 1.6 mmol/L (ref 0.5–2.0)

## 2015-07-19 LAB — MRSA PCR SCREENING: MRSA BY PCR: NEGATIVE

## 2015-07-19 MED ORDER — POTASSIUM CHLORIDE 20 MEQ/15ML (10%) PO SOLN
20.0000 meq | Freq: Once | ORAL | Status: AC
  Administered 2015-07-19: 20 meq
  Filled 2015-07-19: qty 15

## 2015-07-19 MED ORDER — VITAL HIGH PROTEIN PO LIQD
1000.0000 mL | ORAL | Status: DC
Start: 2015-07-19 — End: 2015-07-19
  Filled 2015-07-19 (×2): qty 1000

## 2015-07-19 MED ORDER — FENTANYL CITRATE (PF) 100 MCG/2ML IJ SOLN
50.0000 ug | INTRAMUSCULAR | Status: DC | PRN
  Filled 2015-07-19: qty 2

## 2015-07-19 MED ORDER — VITAL HIGH PROTEIN PO LIQD: 1000.0000 mL | ORAL | Status: DC

## 2015-07-19 MED ORDER — MIDAZOLAM HCL 2 MG/2ML IJ SOLN
1.0000 mg | INTRAMUSCULAR | Status: DC | PRN
  Administered 2015-07-19 – 2015-07-22 (×6): 2 mg via INTRAVENOUS
  Filled 2015-07-19 (×8): qty 2

## 2015-07-19 MED ORDER — FENTANYL CITRATE (PF) 100 MCG/2ML IJ SOLN
50.0000 ug | INTRAMUSCULAR | Status: DC | PRN
  Administered 2015-07-20 – 2015-07-22 (×3): 50 ug via INTRAVENOUS
  Filled 2015-07-19 (×2): qty 2

## 2015-07-19 MED ORDER — FUROSEMIDE 10 MG/ML IJ SOLN: 40.0000 mg | INTRAMUSCULAR | Status: DC

## 2015-07-19 MED ORDER — DEXTROSE 5 % IV SOLN
INTRAVENOUS | Status: DC
  Administered 2015-07-19: 40 mL via INTRAVENOUS

## 2015-07-19 MED ORDER — FUROSEMIDE 10 MG/ML IJ SOLN
20.0000 mg | Freq: Once | INTRAMUSCULAR | Status: AC
  Administered 2015-07-19: 20 mg via INTRAVENOUS
  Filled 2015-07-19: qty 2

## 2015-07-19 MED ORDER — VITAL HIGH PROTEIN PO LIQD
1000.0000 mL | ORAL | Status: DC
Start: 2015-07-19 — End: 2015-07-26
  Administered 2015-07-19 – 2015-07-25 (×9): 1000 mL
  Filled 2015-07-19 (×15): qty 1000

## 2015-07-19 NOTE — Progress Notes (Signed)
RN called patient biting ETT. Bite Block placed on Left side of mouth by RN.

## 2015-07-19 NOTE — Progress Notes (Signed)
Called son Nate and updated on patients status via phone.  Updated on EEG results and plan of care.  He indicates understanding.    Noe Gens, NP-C [facility] Pgr: [phone] or if no answer [phone] 07/19/2015, 2:23 PM

## 2015-07-19 NOTE — Progress Notes (Signed)
PULMONARY / CRITICAL CARE MEDICINE   Name: Madison Todd MRN: [id number] DOB: 14-Jul-1951    ADMISSION DATE:  08/04/2015 CONSULTATION DATE:  08/11/2015  REFERRING MD :  Forestine Na EDP  CHIEF COMPLAINT:  Cardiac arrest  INITIAL PRESENTATION: 64 year old female with sudden onset shortness of breath, was severely dyspneic upon EMS arrival and suffered respiratory, then very brief PEA cardiac arrest in route to emergency department. Total downtime estimated at 1 minute. She was intubated, and had little neurologic activity post arrest. Transferred to [facility] for ICU admission and possible therapeutic hypothermia.  STUDIES:  CXR 11/2 - CHF, asymmetric edema R > L CT head 11/2 - no acute findings, remote Rt medullary infarct CTA chest 11/2 - tiny bilateral pleural effusions with lower lobe & left upper lobe atelectasis versus opacity. Mild interstitial edema. EEG 11/3  - abnormal with markedly suppressed diffuse cerebral activity with generalized slowing which may be related to sedation, encephalopathic state can not be ruled out, no seizures.  ECHO 11/3 - moderate LVH, LVEF 60-65%, no RWMA, grade 1 diastolic dysfunction, PA peak 45 mm hg  SIGNIFICANT EVENTS: 11/02  Taken to [facility] ED after brief PEA cardiac arrest > transferred to [facility] for consideration of therapeutic hypothermia. 11/03  Desaturations, clenching ETT, increased O2 to 60%/PEEP 10   SUBJECTIVE:   CVP 20 overnight, now 10.  Pt with increased UOP.  PEEP 10/50%.  Remains on levophed 36mcg    VITAL SIGNS: Temp:  [96.1 F (35.6 C)-98.4 F (36.9 C)] 98.4 F (36.9 C) (11/04 1000) Pulse Rate:  [72-95] 95 (11/04 0900) Resp:  [9-24] 16 (11/04 0900) BP: (79-154)/(30-102) 109/84 mmHg (11/04 0600) SpO2:  [92 %-100 %] 97 % (11/04 0900) Arterial Line BP: (72-148)/(53-88) 124/68 mmHg (11/04 0900) FiO2 (%):  [40 %-70 %] 60 % (11/04 0744) Weight:  [184 lb 15.5 oz (83.9 kg)] 184 lb 15.5 oz (83.9 kg) (11/04 0308)    HEMODYNAMICS: CVP:  [11 mmHg-22 mmHg] 11 mmHg   VENTILATOR SETTINGS: Vent Mode:  [-] PRVC FiO2 (%):  [40 %-70 %] 60 % Set Rate:  [16 bmp-20 bmp] 16 bmp Vt Set:  [500 mL-510 mL] 510 mL PEEP:  [5 cmH20-10 cmH20] 10 cmH20 Plateau Pressure:  [24 cmH20-33 cmH20] 29 cmH20   INTAKE / OUTPUT:  Intake/Output Summary (Last 24 hours) at 07/19/15 1043 Last data filed at 07/19/15 1000  Gross per 24 hour  Intake 3578.3 ml  Output   2087 ml  Net 1491.3 ml    PHYSICAL EXAMINATION: General:  Adult female in NAD on vent   Integument:  Warm & dry. No rash on exposed skin. Cooling pads in place.  R mastectomy noted.  HEENT:  No scleral injection or icterus. Sclera edema noted. Endotracheal tube in place.  Cardiovascular:  Regular rate. Edema. No appreciable JVD.  Pulmonary:  Symmetric chest wall rise. Clear bilaterally with limited evaluation with pads in place. Abdomen: Soft. Normal bowel sounds. Nondistended.  Neurological:  Off sedation since 11/3 4pm, no response to verbal stimuli, +cough with suction, + corneal on L  LABS:  CBC  Recent Labs Lab 08/06/2015 1825 07/18/15 0906 07/19/15 0350  WBC 17.0* 29.0* 32.4*  HGB 15.4* 16.6* 16.1*  HCT 47.8* 50.5* 50.2*  PLT 308 335 313   Coag's  Recent Labs Lab 08/12/2015 2120 07/18/15 0440  APTT 31  --   INR 1.29 1.26    BMET  Recent Labs Lab 07/18/15 0440 07/18/15 1512 07/19/15 0350  NA 127* 127* 128*  K 4.9 3.8 4.2  CL 89* 89* 91*  CO2 26 27 26   BUN 13 16 17   CREATININE 0.79 0.80 0.66  GLUCOSE 241* 98 93   Electrolytes  Recent Labs Lab 07/18/15 0440 07/18/15 1500 07/18/15 1512 07/19/15 0350  CALCIUM 8.2*  --  7.9* 8.0*  MG 1.3* 3.0*  --  2.4  PHOS 2.8  --  4.5 4.9*   Sepsis Markers  Recent Labs Lab 07/27/2015 1826 07/18/15 0012 07/18/15 1300 07/19/15 0350  LATICACIDVEN 8.65* 2.9*  --  1.6  PROCALCITON  --   --  13.11 8.19   ABG  Recent Labs Lab 08/13/2015 2332 07/18/15 0257 07/18/15 1400  PHART  7.430 7.503* 7.344*  PCO2ART 42.7 34.8* 48.4*  PO2ART 79.0* 40.0* 77.0*   Liver Enzymes  Recent Labs Lab 07/31/2015 1825 07/18/15 1512 07/19/15 0350  AST 123*  --   --   ALT 78*  --   --   ALKPHOS 107  --   --   BILITOT 1.9*  --   --   ALBUMIN 3.0* 2.5* 2.5*   Cardiac Enzymes  Recent Labs Lab 07/16/2015 1825 07/25/2015 2120  TROPONINI 0.05* 0.11*   Glucose  Recent Labs Lab 07/18/15 1717 07/18/15 1833 07/18/15 2109 07/19/15 0006 07/19/15 0353 07/19/15 0748  GLUCAP 77 89 84 125* 84 89    Imaging Portable Chest Xray  07/19/2015  CLINICAL DATA:  Hypoxia EXAM: PORTABLE CHEST 1 VIEW COMPARISON:  July 18, 2015 FINDINGS: Endotracheal tube tip is 3.1 cm above the carina. Nasogastric tube tip and side port are below the diaphragm. Central catheter tip is in the superior vena cava. There is no demonstrable pneumothorax. Moderate interstitial edema remains without significant change. There is persistent consolidation in the left base and medial right base regions. There are small effusions bilaterally with cardiomegaly. Pulmonary vascularity is within normal limits. There are surgical clips in the right axillary region. IMPRESSION: Tube and catheter positions as described without pneumothorax. Evidence of a degree of congestive heart failure, stable. Consolidation in the bases, more on the left than on the right, is a stable finding may represent either alveolar edema or superimposed pneumonia. Both entities may exist concurrently. Electronically Signed   By: Lowella Grip III M.D.   On: 07/19/2015 07:20   CXR images personally reviewed - partial clearing of R airspace disease / edema   ASSESSMENT / PLAN:  CARDIOVASCULAR CVL RIJ 11/2 (placed in ED) >> A:  Cardiac arrest in setting of respiratory arrest Elevated Troponin I Diastolic Dysfunction - grade 1 noted on ECHO Acute on chronic CHF (Grade 1 DD on 09/2014 echo, LVEF 60%) H/O HTN & HLD ? Cor Pulmonale  P:  Levophed to  keep MAP > 65 Cardiology following ASA 81mg  daily Hold outpatient amlodipine & lopressor given hypotension  PULMONARY OETT 11/2 >>> A: Acute Hypoxic Respiratory Failure - Due to CAP +/- component of pulmonary edema CAP Respiratory Alkalosis - Likely due to overventilation.  H/O COPD  P:   PRVC 8cc/kg TV Intermittent CXR Lasix 20 mg IV x1 11/4  See ID section Duoneb q6hr PRN albuterol PRN ABG   NEUROLOGIC A:  Acute Encephalopathy - Toxic metabolic versus anoxic. H/O CVA  P:   RASS goal:  -1 to -2 Fentanyl gtt off since 4pm 11/3 Repeat EEG approximately 48 hours off sedation (11/5 pm) Attempt to minimize sedation  Consider repeat CT head Hold outpt gabapentin Normothermia protocol  RENAL A:   Hyponatremia - Hypervolemic. Improving. Hypomagnesemia Hypokalemia -  Resolved Lactic acidosis - Improving.  P:   Trend lactic acid  Monitor BMP Q12 Mag level Q12  Replace electrolytes as indicated   GASTROINTESTINAL A:   No acute issues.  P:   Protonix IV daily NPO Begin TF per protocol   HEMATOLOGIC / ONCOLOGIC A:   Leukocytosis - Likely secondary to sepsis. VTE prophylaxis H/O Breast CA - s/p radiation  P:  Trend CBC  SCDs Heparin [place] q8hr for DVT prophylaxsis  INFECTIOUS A:   Sepsis - concern for CAP as source  CAP  P:   PCT Algorithm  Azithromycin 11/3>> Rocephin 11/3>>  Respiratory Viral PCR 11/3>> Urine Strep Ag 11/3 >> neg  Urine Legionella Ag 11/3 >> Trach Asp Ctx 11/3>>  Blood 11/02 >> Urine Ctx 11/02>>  ENDOCRINE A:  H/O DM Hypoglycemia   P:   Accuchecks q4hr Low dose SSI for coverage D/C D5w, begin TF 11/4   FAMILY UPDATE: - Family:  No family at bedside 11/4.   - Inter-disciplinary family meet or Palliative Care meeting due by:  07/23/15.  TODAY'S SUMMARY:  Unfortunate 64 year old female with respiratory arrest precipitating cardiac arrest. Likely etiology is community acquired pneumonia. Checking respiratory viral  panel PCR as well as additional urine studies and cultures to identify potential etiology. Continuing broad-spectrum antibiotics. Patient continuing on normothermia protocol.  Recommend repeat EEG approximately 48 hours off continuous sedation     Noe Gens, NP-C [place] [facility] Pgr: [phone] or if no answer [phone] 07/19/2015, 10:44 AM

## 2015-07-19 NOTE — Progress Notes (Signed)
    Subjective:  Remains intubated. Sedation discontinued and no movements noted by RN.   Objective:  Vital Signs in the last 24 hours: Temp:  [96.1 F (35.6 C)-98.1 F (36.7 C)] 97.7 F (36.5 C) (11/04 0700) Pulse Rate:  [72-91] 86 (11/04 0700) Resp:  [9-24] 16 (11/04 0700) BP: (79-154)/(30-102) 109/84 mmHg (11/04 0600) SpO2:  [92 %-100 %] 92 % (11/04 0744) Arterial Line BP: (72-140)/(53-88) 96/61 mmHg (11/04 0700) FiO2 (%):  [40 %-70 %] 60 % (11/04 0744) Weight:  [184 lb 15.5 oz (83.9 kg)] 184 lb 15.5 oz (83.9 kg) (11/04 0308)  Intake/Output from previous day: 11/03 0701 - 11/04 0700 In: 3600.5 [I.V.:3110.5; NG/GT:90; IV Piggyback:400] Out: 1610 [Urine:1522; Emesis/NG output:200]  Physical Exam: NAD HEENT: normal, ETT in place Neck: carotids 2+ without bruits Lungs: bibasilar rales CV: RRR without murmur or gallop Abd: soft, NT, Positive BS, no hepatomegaly Ext: no C/C/E, distal pulses intact and equal Skin: warm/dry no rash   Lab Results:  Recent Labs  07/18/15 0906 07/19/15 0350  WBC 29.0* 32.4*  HGB 16.6* 16.1*  PLT 335 313    Recent Labs  07/18/15 1512 07/19/15 0350  NA 127* 128*  K 3.8 4.2  CL 89* 91*  CO2 27 26  GLUCOSE 98 93  BUN 16 17  CREATININE 0.80 0.66    Recent Labs  07/27/2015 1825 08/03/2015 2120  TROPONINI 0.05* 0.11*    Cardiac Studies: Echo 11/3 with LV EF 60-65%, moderate LVH, wall motion normal, grade 1 diastolic dysfunction, PA peak pressure 37mm Hg, elevated right heart pressure. Dilated IVC.  Tele: NSR  Assessment/Plan:  64 year old female with COPD, prior stroke with sudden onset dyspnea, severe respiratory distress and brief PEA arrest 1-2 minutes, subsequently unresponsive.  PEA arrest: Likely 2/2 hypoxia +/- acute CHF exacerbation given CXR findings with pulmonary edema and initial EKG findings suggestive of right atrial enlargement. Low level troponin elevation likely demand ischemia in the setting of respiratory  arrest. CTA with no evidence of PE. There is small R effusion and mild interstitial edema. -Rewarming initiated last night. -May need repeat head CT as she was previously having movements and is not responding to painful stimuli now.  Septic Shock, CAP: Resp cx with rare GPC in pairs and GNR. Increasing leukocytosis.  -IV ceftriaxone and azithromycin -Levophed gtt, titrate to MAP >65.  Hypervolemic hyponatremia: Na 120 on admission though improved to 127 with Lasix IV. CXR with pulmonary edema. Na 128 this morning.  Hypomagnesemia: Mg 2.4 this morning. Resolved.  Jacques Earthly, M.D. 07/19/2015, 8:03 AM    Personally seen and examined. Agree with above. Waiting for neurologic response Rewarming CVP 20, normal EF reassuring Consider lasix 40 IV as long as sepsis not felt to be issue.  Candee Furbish, MD

## 2015-07-19 NOTE — Progress Notes (Signed)
Initial Nutrition Assessment  DOCUMENTATION CODES:   Not applicable  INTERVENTION:   Initiate Vital High Protein formula at 25 ml/hr and increase by 10 ml every 4 hours to goal rate of 65 ml/hr  TF regimen to provide 1560 kcals, 136 gm protein, 1304 ml of free water  NUTRITION DIAGNOSIS:   Inadequate oral intake related to inability to eat as evidenced by NPO status  GOAL:   Patient will meet greater than or equal to 90% of their needs  MONITOR:   TF tolerance, Vent status, Labs, Weight trends, I & O's  REASON FOR ASSESSMENT:   Consult Enteral/tube feeding initiation and management  ASSESSMENT:   64 yo Female with sudden onset SOB, was severely dyspneic upon EMS arrival and suffered respiratory, then very brief PEA cardiac arrest in route to emergency department. Total downtime estimated at 1 minute. She was intubated, and had little neurologic activity post arrest. Transferred to [facility] for ICU admission and possible therapeutic hypothermia.  Patient is currently intubated on ventilator support -- OGT in place MV: 7.8 L/min Temp (24hrs), Avg:97.5 F (36.4 C), Min:96.1 F (35.6 C), Max:98.8 F (37.1 C)   Patient s/p EEG 11/3.  Interpretation was abnormal with markedly suppressed diffuse cerebral activity.  Repeat EEG recommended.  RD unable to complete Nutrition Focused Physical Exam at this time.  [place] team in process of placing small bore feeding tube.  Diet Order:   NPO  Skin:  Reviewed, no issues  Last BM:  N/A  Height:   Ht Readings from Last 1 Encounters:  07/19/15 5\' 8"  (1.727 m)    Weight:   Wt Readings from Last 1 Encounters:  07/19/15 184 lb 15.5 oz (83.9 kg)    Ideal Body Weight:  63.6 kg  BMI:  Body mass index is 28.13 kg/(m^2).  Estimated Nutritional Needs:   Kcal:  7416  Protein:  120-130 gm  Fluid:  per MD  EDUCATION NEEDS:   No education needs identified at this time  Arthur Holms, RD, LDN Pager #:  [phone] After-Hours Pager #: [phone]

## 2015-07-19 NOTE — Clinical Documentation Improvement (Signed)
Cardiology  After further study, please clarify the likely etiology of the patient's PEA. Please update your documentation within the medical record to reflect your response to this query. Thank you!   PEA likely secondary to Sepsis present or evolving on admission, Acute ___ CHF, Acute Pulmonary Edema unrelated to CHF  Other  Clinically Undetermined  Your response will determine the DRG.  Please exercise your independent, professional judgment when responding. A specific answer is not anticipated or expected.  Thank You, Zoila Shutter RN, [place] [phone]

## 2015-07-19 NOTE — Care Management Important Message (Signed)
Important Message  Patient Details  Name: Madison Todd MRN: [id number] Date of Birth: November 21, 1950   Medicare Important Message Given:  Yes-second notification given    Delorse Lek 07/19/2015, 1:23 PM

## 2015-07-19 NOTE — Clinical Documentation Improvement (Signed)
Cardiology  Can the diagnosis of Shock be further specified?   Shock, including Type:  Septic, Cardiogenic, Hypovolemic, Neurogenic, Other type, including suspected or known cause and/or associated condition(s)  Other  Clinically Undetermined  Document any associated diagnoses/conditions.  Supporting Information:  Sepsis  Cardio-Pulmonary Arrest  Please exercise your independent, professional judgment when responding. A specific answer is not anticipated or expected.  Thank You,  Zoila Shutter RN, [place] [phone]; Cell: [phone]

## 2015-07-19 NOTE — Clinical Documentation Improvement (Signed)
Cardiology  Can the diagnosis of acute on chronic CHF be further specified? Please update your documentation within the medical record to reflect your response to this query. Thank you!    Type - Systolic, Diastolic, Systolic and Diastolic  Other  Clinically Undetermined  Document any associated diagnoses/conditions  Supporting Information:  ECHO reveals EF of 60-65% with grade 1 diastolic dysfunction  Stat IV Lasix 40 mg given twice  Please exercise your independent, professional judgment when responding. A specific answer is not anticipated or expected.  Thank You,  Zoila Shutter RN, [place] [phone]

## 2015-07-19 NOTE — Progress Notes (Signed)
[facility] Progress Note Patient Name: Madison Todd DOB: [birth year] MRN: [id number]   Date of Service  07/19/2015  HPI/Events of Note  Camera chk & RN report. Pt responding to sternal rub & still biting the tube at times. Levophed weaning at 2 & CVP 20+. Peak pressure 32 on vent & FiO2 0.7 w/ Sat >94%. Ventilation acceptable on earlier ABG.   eICU Interventions  1. Continue Fentanyl wean 2. Versed 1-2 mg IV q1hr prn 3. Continuing normothermia & will rewarm soon 4. Change vent:  FiO2 0.6 & PEEP 10.     Intervention Category Major Interventions: Respiratory failure - evaluation and management  Tera Partridge 07/19/2015, 1:29 AM

## 2015-07-20 ENCOUNTER — Inpatient Hospital Stay (HOSPITAL_COMMUNITY): Payer: Commercial Managed Care - HMO

## 2015-07-20 LAB — RENAL FUNCTION PANEL
ALBUMIN: 2.1 g/dL — AB (ref 3.5–5.0)
ANION GAP: 10 (ref 5–15)
Albumin: 2.2 g/dL — ABNORMAL LOW (ref 3.5–5.0)
Anion gap: 9 (ref 5–15)
BUN: 14 mg/dL (ref 6–20)
BUN: 13 mg/dL (ref 6–20)
CALCIUM: 7.9 mg/dL — AB (ref 8.9–10.3)
CHLORIDE: 91 mmol/L — AB (ref 101–111)
CHLORIDE: 92 mmol/L — AB (ref 101–111)
CO2: 30 mmol/L (ref 22–32)
CO2: 28 mmol/L (ref 22–32)
CREATININE: 0.76 mg/dL (ref 0.44–1.00)
CREATININE: 0.7 mg/dL (ref 0.44–1.00)
Calcium: 8 mg/dL — ABNORMAL LOW (ref 8.9–10.3)
GFR calc non Af Amer: >60 mL/min (ref >60)
GFR calc non Af Amer: >60 mL/min (ref >60)
GLUCOSE: 154 mg/dL — AB (ref 65–99)
GLUCOSE: 219 mg/dL — AB (ref 65–99)
Phosphorus: 3.1 mg/dL (ref 2.5–4.6)
Phosphorus: 2 mg/dL — ABNORMAL LOW (ref 2.5–4.6)
Potassium: 3.7 mmol/L (ref 3.5–5.1)
Potassium: 3.6 mmol/L (ref 3.5–5.1)
SODIUM: 130 mmol/L — AB (ref 135–145)
SODIUM: 130 mmol/L — AB (ref 135–145)

## 2015-07-20 LAB — CBC WITH DIFFERENTIAL/PLATELET
BASOS PCT: 0 %
Basophils Absolute: 0 10*3/uL (ref 0.0–0.1)
EOS ABS: 0 10*3/uL (ref 0.0–0.7)
Eosinophils Relative: 0 %
HEMATOCRIT: 45.2 % (ref 36.0–46.0)
Hemoglobin: 14.2 g/dL (ref 12.0–15.0)
LYMPHS ABS: 1.1 10*3/uL (ref 0.7–4.0)
Lymphocytes Relative: 5 %
MCH: 26 pg (ref 26.0–34.0)
MCHC: 31.4 g/dL (ref 30.0–36.0)
MCV: 82.8 fL (ref 78.0–100.0)
MONO ABS: 1.9 10*3/uL — AB (ref 0.1–1.0)
MONOS PCT: 8 %
NEUTROS ABS: 20.3 10*3/uL — AB (ref 1.7–7.7)
NEUTROS PCT: 87 %
Platelets: 271 10*3/uL (ref 150–400)
RBC: 5.46 MIL/uL — ABNORMAL HIGH (ref 3.87–5.11)
RDW: 17.3 % — AB (ref 11.5–15.5)
WBC: 23.3 10*3/uL — ABNORMAL HIGH (ref 4.0–10.5)

## 2015-07-20 LAB — GLUCOSE, CAPILLARY
GLUCOSE-CAPILLARY: 148 mg/dL — AB (ref 65–99)
GLUCOSE-CAPILLARY: 207 mg/dL — AB (ref 65–99)
GLUCOSE-CAPILLARY: 196 mg/dL — AB (ref 65–99)
Glucose-Capillary: 135 mg/dL — ABNORMAL HIGH (ref 65–99)
Glucose-Capillary: 155 mg/dL — ABNORMAL HIGH (ref 65–99)
Glucose-Capillary: 184 mg/dL — ABNORMAL HIGH (ref 65–99)

## 2015-07-20 LAB — PROCALCITONIN: Procalcitonin: 3.11 ng/mL

## 2015-07-20 LAB — CULTURE, RESPIRATORY: SPECIAL REQUESTS: NORMAL

## 2015-07-20 LAB — MAGNESIUM: Magnesium: 1.9 mg/dL (ref 1.7–2.4)

## 2015-07-20 LAB — CULTURE, RESPIRATORY W GRAM STAIN

## 2015-07-20 MED ORDER — PNEUMOCOCCAL VAC POLYVALENT 25 MCG/0.5ML IJ INJ: 0.5000 mL | INJECTION | INTRAMUSCULAR | Status: DC | PRN

## 2015-07-20 NOTE — Progress Notes (Signed)
Decreased PEEP to 5 cmH2O, SATS 99%.

## 2015-07-20 NOTE — Progress Notes (Signed)
EEG completed, results pending. 

## 2015-07-20 NOTE — Progress Notes (Signed)
PULMONARY / CRITICAL CARE MEDICINE   Name: Madison Todd MRN: [id number] DOB: 03-Mar-1951    ADMISSION DATE:  08/12/2015 CONSULTATION DATE:  07/24/2015  REFERRING MD :  Forestine Na EDP  CHIEF COMPLAINT:  Cardiac arrest  INITIAL PRESENTATION: 64 year old female with sudden onset shortness of breath, was severely dyspneic upon EMS arrival and suffered respiratory, then very brief PEA cardiac arrest in route to emergency department. Total downtime estimated at 1 minute. She was intubated, and had little neurologic activity post arrest. Transferred to [facility] for ICU admission and possible therapeutic hypothermia.  STUDIES:  CXR 11/2 - CHF, asymmetric edema R > L CT head 11/2 - no acute findings, remote Rt medullary infarct CTA chest 11/2 - tiny bilateral pleural effusions with lower lobe & left upper lobe atelectasis versus opacity. Mild interstitial edema. EEG 11/3  - abnormal with markedly suppressed diffuse cerebral activity with generalized slowing which may be related to sedation, encephalopathic state can not be ruled out, no seizures.  ECHO 11/3 - moderate LVH, LVEF 60-65%, no RWMA, grade 1 diastolic dysfunction, PA peak 45 mm hg  SIGNIFICANT EVENTS: 11/02  Taken to [facility] ED after brief PEA cardiac arrest > transferred to [facility] for consideration of therapeutic hypothermia. 11/03  Desaturations, clenching ETT, increased O2 to 60%/PEEP 10   SUBJECTIVE:   Off sedation.  No response per RN.   VITAL SIGNS: Temp:  [98.1 F (36.7 C)-99 F (37.2 C)] 99 F (37.2 C) (11/05 0600) Pulse Rate:  [91-114] 106 (11/05 0700) Resp:  [12-22] 17 (11/05 0700) BP: (168)/(88) 168/88 mmHg (11/04 1135) SpO2:  [92 %-100 %] 100 % (11/05 0803) Arterial Line BP: (91-149)/(52-78) 140/69 mmHg (11/05 0700) FiO2 (%):  [40 %-50 %] 40 % (11/05 0804) Weight:  [183 lb 6.8 oz (83.2 kg)] 183 lb 6.8 oz (83.2 kg) (11/05 0245)   HEMODYNAMICS: CVP:  [8 mmHg-12 mmHg] 12 mmHg   VENTILATOR  SETTINGS: Vent Mode:  [-] PRVC FiO2 (%):  [40 %-50 %] 40 % Set Rate:  [16 bmp] 16 bmp Vt Set:  [510 mL] 510 mL PEEP:  [5 cmH20-10 cmH20] 5 cmH20 Plateau Pressure:  [19 cmH20-31 cmH20] 22 cmH20   INTAKE / OUTPUT:  Intake/Output Summary (Last 24 hours) at 07/20/15 1010 Last data filed at 07/20/15 0600  Gross per 24 hour  Intake 2161.43 ml  Output   2020 ml  Net 141.43 ml    PHYSICAL EXAMINATION: General:  Adult female in NAD on vent   Integument:  Warm & dry. No rash on exposed skin. Cooling pads in place.  R mastectomy noted.  HEENT:  No scleral injection or icterus. Sclera edema noted. ETT Cardiovascular:  Regular rate. Edema. No appreciable JVD.  Pulmonary:  Symmetric chest wall rise. Clear bilaterally with limited evaluation with pads in place. Abdomen: Soft. Normal bowel sounds. Nondistended.  Neurological:  Off sedation since 11/3 4pm, no response to verbal stimuli, +cough with suction, + corneal on L, pupils 78mm very sluggish, breathes over vent rate   LABS:  CBC  Recent Labs Lab 07/18/15 0906 07/19/15 0350 07/20/15 0426  WBC 29.0* 32.4* 23.3*  HGB 16.6* 16.1* 14.2  HCT 50.5* 50.2* 45.2  PLT 335 313 271   Coag's  Recent Labs Lab 08/09/2015 2120 07/18/15 0440  APTT 31  --   INR 1.29 1.26    BMET  Recent Labs Lab 07/19/15 0350 07/19/15 1540 07/20/15 0426  NA 128* 133* 130*  K 4.2 3.9 3.7  CL 91* 91*  91*  CO2 26 27 30   BUN 17 13 14   CREATININE 0.66 0.79 0.76  GLUCOSE 93 107* 154*   Electrolytes  Recent Labs Lab 07/18/15 1500  07/19/15 0350 07/19/15 1540 07/20/15 0426  CALCIUM  --   < > 8.0* 8.4* 7.9*  MG 3.0*  --  2.4  --  1.9  PHOS  --   < > 4.9* 3.5 3.1  < > = values in this interval not displayed. Sepsis Markers  Recent Labs Lab 07/21/2015 1826 07/18/15 0012 07/18/15 1300 07/19/15 0350 07/20/15 0426  LATICACIDVEN 8.65* 2.9*  --  1.6  --   PROCALCITON  --   --  13.11 8.19 3.11   ABG  Recent Labs Lab 07/18/15 0257  07/18/15 1400 07/19/15 1200  PHART 7.503* 7.344* 7.394  PCO2ART 34.8* 48.4* 44.3  PO2ART 40.0* 77.0* 74.7*   Liver Enzymes  Recent Labs Lab 07/20/2015 1825  07/19/15 0350 07/19/15 1540 07/20/15 0426  AST 123*  --   --   --   --   ALT 78*  --   --   --   --   ALKPHOS 107  --   --   --   --   BILITOT 1.9*  --   --   --   --   ALBUMIN 3.0*  < > 2.5* 2.4* 2.2*  < > = values in this interval not displayed. Cardiac Enzymes  Recent Labs Lab 07/25/2015 1825 08/08/2015 2120  TROPONINI 0.05* 0.11*   Glucose  Recent Labs Lab 07/19/15 1119 07/19/15 1629 07/19/15 2032 07/19/15 2358 07/20/15 0406 07/20/15 0735  GLUCAP 105* 94 103* 155* 135* 148*    Imaging Dg Chest Port 1 View  07/20/2015  CLINICAL DATA:  Acute respiratory failure. EXAM: PORTABLE CHEST 1 VIEW COMPARISON:  Chest x-rays dated 07/19/2015 and 07/18/2015 and CT scan dated 08/08/2015 FINDINGS: Endotracheal tube, feeding tube and central venous catheter appear in good position. Pulmonary edema has increased slightly on the right since the prior study. Improved aeration at the left lung base with slight residual atelectasis. Pulmonary vascularity appears normal. IMPRESSION: Slight worsening of pulmonary edema at the right base. Improved edema and atelectasis on the left. Electronically Signed   By: Lorriane Shire M.D.   On: 07/20/2015 08:44   Dg Abd Portable 1v  07/19/2015  CLINICAL DATA:  Symptom placement EXAM: PORTABLE ABDOMEN - 1 VIEW COMPARISON:  None. FINDINGS: There is normal small bowel gas pattern. NG feeding tube with tip in proximal small bowel. IMPRESSION: NG feeding tube in place. Electronically Signed   By: Lahoma Crocker M.D.   On: 07/19/2015 16:23     ASSESSMENT / PLAN:  CARDIOVASCULAR CVL RIJ 11/2 (placed in ED) >> A:  PEA arrest - likely primary respiratory arrest r/t PNA +/- CHF Elevated Troponin I Diastolic Dysfunction - grade 1 noted on ECHO Acute on chronic CHF (Grade 1 DD on 09/2014 echo, LVEF  60%) H/O HTN & HLD ? Cor Pulmonale  P:  Levophed to keep MAP > 65 Cardiology following ASA 81mg  daily Hold outpatient amlodipine & lopressor given hypotension   PULMONARY OETT 11/2 >>> A: Acute Hypoxic Respiratory Failure - Due to CAP +/- component of pulmonary edema CAP Respiratory Alkalosis - Likely due to overventilation.  H/O COPD  P:   PRVC 8cc/kg TV Intermittent CXR See ID section Duoneb q6hr PRN albuterol PRN ABG   NEUROLOGIC A:  Acute Encephalopathy - suspect anoxic H/O CVA  P:   RASS goal:  -  1 to -2 Fentanyl gtt off since 4pm 11/3 Repeat EEG 11/5  Consider repeat CT head Hold outpt gabapentin Normothermia protocol  RENAL A:   Hyponatremia - Hypervolemic. Improving. Hypomagnesemia Hypokalemia - Resolved Lactic acidosis - Improving.  P:   Trend lactic acid  Monitor BMP Q12 Mag level Q12  Replace electrolytes as indicated   GASTROINTESTINAL A:   No acute issues.  P:   Protonix IV daily NPO Cont TF  HEMATOLOGIC / ONCOLOGIC A:   Leukocytosis - Likely secondary to sepsis. VTE prophylaxis H/O Breast CA - s/p radiation  P:  Trend CBC  SCDs SQ heparin   INFECTIOUS A:   Sepsis - concern for CAP as source  CAP  P:   PCT Algorithm  Azithromycin 11/3>> Rocephin 11/3>>  Respiratory Viral PCR 11/3>> Urine Strep Ag 11/3 >> neg  Urine Legionella Ag 11/3 >> Trach Asp Ctx 11/3>>few candida   Blood 11/02 >> Urine Ctx 11/02>>neg  ENDOCRINE A:  H/O DM Hypoglycemia - resolved   P:   Accuchecks q4hr Low dose SSI for coverage Cont TF   FAMILY UPDATE: - Family:  No family at bedside 11/5 - Inter-disciplinary family meet or Palliative Care meeting due by:  07/23/15.  Need to discuss with family, poor overall prognosis.  Await repeat EEG.  Normothermia protocol to finish 11/6  Nickolas Madrid, NP 07/20/2015  10:11 AM Pager: [phone] or [phone]

## 2015-07-20 NOTE — Progress Notes (Signed)
[facility] Progress Note Patient Name: Madison Todd DOB: 1950-12-28 MRN: [id number]   Date of Service  07/20/2015  HPI/Events of Note  suagres improved to 155 with tuvebe feeds  eICU Interventions  Dc d5     Intervention Category Intermediate Interventions: Other:  Delfin Squillace 07/20/2015, 12:18 AM

## 2015-07-20 NOTE — Progress Notes (Signed)
    Subjective:  Remains intubated. Sedation discontinued and no movements noted by RN.   Objective:  Vital Signs in the last 24 hours: Temp:  [97.7 F (36.5 C)-99 F (37.2 C)] 99 F (37.2 C) (11/05 0600) Pulse Rate:  [91-114] 106 (11/05 0700) Resp:  [12-22] 17 (11/05 0700) BP: (168)/(88) 168/88 mmHg (11/04 1135) SpO2:  [92 %-100 %] 100 % (11/05 0700) Arterial Line BP: (91-149)/(52-85) 140/69 mmHg (11/05 0700) FiO2 (%):  [40 %-50 %] 40 % (11/05 0334) Weight:  [183 lb 6.8 oz (83.2 kg)] 183 lb 6.8 oz (83.2 kg) (11/05 0245)  Intake/Output from previous day: 11/04 0701 - 11/05 0700 In: 2547.7 [I.V.:1746.3; NG/GT:501.3; IV Piggyback:300] Out: 2535 [Urine:2535]  Physical Exam: NAD HEENT: normal, ETT in place Lungs: clear to auscultation in anterior lung fields CV: RRR without murmur or gallop Abd: soft, NT, Positive BS, no hepatomegaly Ext: no C/C/E, distal pulses intact and equal Neuro: sluggish pupillary reflexes, no response to noxious stimuli Skin: warm/dry no rash   Lab Results:  Recent Labs  07/19/15 0350 07/20/15 0426  WBC 32.4* 23.3*  HGB 16.1* 14.2  PLT 313 271    Recent Labs  07/19/15 1540 07/20/15 0426  NA 133* 130*  K 3.9 3.7  CL 91* 91*  CO2 27 30  GLUCOSE 107* 154*  BUN 13 14  CREATININE 0.79 0.76    Recent Labs  08/06/2015 1825 07/16/2015 2120  TROPONINI 0.05* 0.11*    Cardiac Studies: Echo 11/3 with LV EF 60-65%, moderate LVH, wall motion normal, grade 1 diastolic dysfunction, PA peak pressure 48mm Hg, elevated right heart pressure. Dilated IVC.  Tele: NSR  Assessment/Plan:  64 year old female with COPD, prior stroke with sudden onset dyspnea, severe respiratory distress and brief PEA arrest 1-2 minutes, subsequently unresponsive.  PEA arrest: Likely 2/2 hypoxia due to PNA and possible CHF. EF normal but CXR findings with pulmonary edema and initial EKG findings suggestive of right atrial enlargement. Low level troponin elevation likely  demand ischemia in the setting of respiratory arrest. CTA with no evidence of PE. There is small R effusion and mild interstitial edema. Troponin normal -s/p rewarming  Septic Shock, CAP: Resp cx with rare GPC in pairs and GNR. Increasing leukocytosis.  -IV ceftriaxone and azithromycin -Levophed gtt, titrate to MAP >65.  Hypervolemic hyponatremia: Na 130 now. Improved on IV Lasix.  Anoxic Brain injury: Pending EEG   Charlott Rakes, M.D. 07/20/2015, 7:47 AM   Patient seen and examined with Dr. Posey Pronto. We discussed all aspects of the encounter. I agree with the assessment and plan as stated above.   She is s/p PEA arrest in setting of severe hypoxemia. Now s/p cooling. Appears to have some degree of anoxic brain injury. EEG pending. ECHO and troponi are normal. Doubt this is cardiac. We will sign off. Please call with questions.   Jaydien Panepinto,MD 9:39 AM

## 2015-07-20 NOTE — Procedures (Signed)
History: 64 yo F s/p cardiac arrest.   Sedation: None  Technique: This is a 19 channel routine scalp EEG performed at the bedside with bipolar and monopolar montages arranged in accordance to the international 10/20 system of electrode placement. One channel was dedicated to EKG recording.    Background: The background consists of low voltage generalized delta activity with superimposed cause a periodic bifrontally predominant discharges with a triphasic morphology. There is no posterior dominant rhythm visualized.  Photic stimulation: Physiologic driving is none  EEG Abnormalities: 1) generalized irregular slow activity 2) triphasic waves 3) absent PDR  Clinical Interpretation: This EEG is consistent with a generalized non-specific cerebral dysfunction(encephalopathy). There was no seizure or seizure predisposition recorded on this study.   Roland Rack, MD [facility] [phone]  If 7pm- 7am, please page neurology on call as listed in [facility].

## 2015-07-21 LAB — RENAL FUNCTION PANEL
ALBUMIN: 2 g/dL — AB (ref 3.5–5.0)
Anion gap: 9 (ref 5–15)
BUN: 10 mg/dL (ref 6–20)
CALCIUM: 8.4 mg/dL — AB (ref 8.9–10.3)
CHLORIDE: 93 mmol/L — AB (ref 101–111)
CO2: 32 mmol/L (ref 22–32)
CREATININE: 0.56 mg/dL (ref 0.44–1.00)
Glucose, Bld: 193 mg/dL — ABNORMAL HIGH (ref 65–99)
PHOSPHORUS: 2.3 mg/dL — AB (ref 2.5–4.6)
Potassium: 3.9 mmol/L (ref 3.5–5.1)
SODIUM: 134 mmol/L — AB (ref 135–145)

## 2015-07-21 LAB — BASIC METABOLIC PANEL
Anion gap: 8 (ref 5–15)
BUN: 10 mg/dL (ref 6–20)
CALCIUM: 8.4 mg/dL — AB (ref 8.9–10.3)
CO2: 32 mmol/L (ref 22–32)
CREATININE: 0.57 mg/dL (ref 0.44–1.00)
Chloride: 93 mmol/L — ABNORMAL LOW (ref 101–111)
Glucose, Bld: 195 mg/dL — ABNORMAL HIGH (ref 65–99)
Potassium: 3.9 mmol/L (ref 3.5–5.1)
Sodium: 133 mmol/L — ABNORMAL LOW (ref 135–145)

## 2015-07-21 LAB — GLUCOSE, CAPILLARY
GLUCOSE-CAPILLARY: 177 mg/dL — AB (ref 65–99)
GLUCOSE-CAPILLARY: 193 mg/dL — AB (ref 65–99)
GLUCOSE-CAPILLARY: 210 mg/dL — AB (ref 65–99)
GLUCOSE-CAPILLARY: 233 mg/dL — AB (ref 65–99)
Glucose-Capillary: 169 mg/dL — ABNORMAL HIGH (ref 65–99)
Glucose-Capillary: 182 mg/dL — ABNORMAL HIGH (ref 65–99)
Glucose-Capillary: 255 mg/dL — ABNORMAL HIGH (ref 65–99)

## 2015-07-21 LAB — RESPIRATORY VIRUS PANEL
ADENOVIRUS: NEGATIVE
INFLUENZA A: NEGATIVE
INFLUENZA B 1: NEGATIVE
METAPNEUMOVIRUS: NEGATIVE
PARAINFLUENZA 2 A: NEGATIVE
PARAINFLUENZA 3 A: NEGATIVE
Parainfluenza 1: NEGATIVE
RHINOVIRUS: NEGATIVE
Respiratory Syncytial Virus A: NEGATIVE
Respiratory Syncytial Virus B: NEGATIVE

## 2015-07-21 LAB — CBC
HEMATOCRIT: 42.5 % (ref 36.0–46.0)
Hemoglobin: 13.5 g/dL (ref 12.0–15.0)
MCH: 26 pg (ref 26.0–34.0)
MCHC: 31.8 g/dL (ref 30.0–36.0)
MCV: 81.9 fL (ref 78.0–100.0)
PLATELETS: 267 10*3/uL (ref 150–400)
RBC: 5.19 MIL/uL — ABNORMAL HIGH (ref 3.87–5.11)
RDW: 17.2 % — AB (ref 11.5–15.5)
WBC: 19.3 10*3/uL — ABNORMAL HIGH (ref 4.0–10.5)

## 2015-07-21 LAB — MAGNESIUM: MAGNESIUM: 1.6 mg/dL — AB (ref 1.7–2.4)

## 2015-07-21 NOTE — Progress Notes (Signed)
PULMONARY / CRITICAL CARE MEDICINE   Name: Madison Todd MRN: [id number] DOB: [birth year]    ADMISSION DATE:  08/10/2015 CONSULTATION DATE:  07/25/2015  REFERRING MD :  Forestine Na EDP  CHIEF COMPLAINT:  Cardiac arrest  INITIAL PRESENTATION: 64 year old female with sudden onset shortness of breath, was severely dyspneic upon EMS arrival and suffered respiratory, then very brief PEA cardiac arrest in route to emergency department. Total downtime estimated at 1 minute. She was intubated, and had little neurologic activity post arrest. Transferred to [facility] for ICU admission and possible therapeutic hypothermia.  STUDIES:  CXR 11/2 - CHF, asymmetric edema R > L CT head 11/2 - no acute findings, remote Rt medullary infarct CTA chest 11/2 - tiny bilateral pleural effusions with lower lobe & left upper lobe atelectasis versus opacity. Mild interstitial edema. EEG 11/3  - abnormal with markedly suppressed diffuse cerebral activity with generalized slowing which may be related to sedation, encephalopathic state can not be ruled out, no seizures.  ECHO 11/3 - moderate LVH, LVEF 60-65%, no RWMA, grade 1 diastolic dysfunction, PA peak 45 mm hg EEG 11/5>>> generalized slowing c/w encephalopathy, no seizure.   SIGNIFICANT EVENTS: 11/02  Taken to [facility] ED after brief PEA cardiac arrest > transferred to [facility] for consideration of therapeutic hypothermia. 11/03  Desaturations, clenching ETT, increased O2 to 60%/PEEP 10   SUBJECTIVE:   Off sedation.  Remains unresponsive.  Has resp drive - tolerating PS 10/5.  Off pressors.   VITAL SIGNS: Temp:  [96.6 F (35.9 C)-99.7 F (37.6 C)] 96.6 F (35.9 C) (11/06 0900) Pulse Rate:  [99-122] 116 (11/06 0900) Resp:  [16-31] 31 (11/06 0900) SpO2:  [90 %-98 %] 96 % (11/06 0900) Arterial Line BP: (80-230)/(42-114) 151/62 mmHg (11/06 0900) FiO2 (%):  [40 %] 40 % (11/06 0817) Weight:  [179 lb 14.3 oz (81.6 kg)] 179 lb 14.3 oz (81.6 kg) (11/06 0437)    HEMODYNAMICS: CVP:  [0 mmHg-13 mmHg] 9 mmHg   VENTILATOR SETTINGS: Vent Mode:  [-] PSV;CPAP FiO2 (%):  [40 %] 40 % Set Rate:  [16 bmp] 16 bmp Vt Set:  [510 mL] 510 mL PEEP:  [5 cmH20] 5 cmH20 Pressure Support:  [10 cmH20] 10 cmH20 Plateau Pressure:  [19 cmH20-21 cmH20] 19 cmH20   INTAKE / OUTPUT:  Intake/Output Summary (Last 24 hours) at 07/21/15 0945 Last data filed at 07/21/15 0900  Gross per 24 hour  Intake   2080 ml  Output   1200 ml  Net    880 ml    PHYSICAL EXAMINATION: General:  Adult female in NAD on vent   Integument:  Warm & dry. No rash on exposed skin.  R mastectomy noted.  HEENT:  No scleral injection or icterus. Sclera edema noted. ETT Cardiovascular:  Regular rate. Edema. No appreciable JVD.  Pulmonary:  resps even non labored on PS 10/5, few scattered rhonchi  Abdomen: Soft. Normal bowel sounds. Nondistended.  Neurological:  Off sedation since 11/3 4pm, no response to verbal stimuli, POS resp drive, no gag, no corneals  LABS:  CBC  Recent Labs Lab 07/19/15 0350 07/20/15 0426 07/21/15 0410  WBC 32.4* 23.3* 19.3*  HGB 16.1* 14.2 13.5  HCT 50.2* 45.2 42.5  PLT 313 271 267   Coag's  Recent Labs Lab 08/01/2015 2120 07/18/15 0440  APTT 31  --   INR 1.29 1.26    BMET  Recent Labs Lab 07/20/15 0426 07/20/15 1437 07/21/15 0410  NA 130* 130* 133*  134*  K 3.7 3.6 3.9  3.9  CL 91* 92* 93*  93*  CO2 30 28 32  32  BUN 14 13 10  10   CREATININE 0.76 0.70 0.57  0.56  GLUCOSE 154* 219* 195*  193*   Electrolytes  Recent Labs Lab 07/19/15 0350  07/20/15 0426 07/20/15 1437 07/21/15 0410  CALCIUM 8.0*  < > 7.9* 8.0* 8.4*  8.4*  MG 2.4  --  1.9  --  1.6*  PHOS 4.9*  < > 3.1 2.0* 2.3*  < > = values in this interval not displayed. Sepsis Markers  Recent Labs Lab 07/30/2015 1826 07/18/15 0012 07/18/15 1300 07/19/15 0350 07/20/15 0426  LATICACIDVEN 8.65* 2.9*  --  1.6  --   PROCALCITON  --   --  13.11 8.19 3.11    ABG  Recent Labs Lab 07/18/15 0257 07/18/15 1400 07/19/15 1200  PHART 7.503* 7.344* 7.394  PCO2ART 34.8* 48.4* 44.3  PO2ART 40.0* 77.0* 74.7*   Liver Enzymes  Recent Labs Lab 07/31/2015 1825  07/20/15 0426 07/20/15 1437 07/21/15 0410  AST 123*  --   --   --   --   ALT 78*  --   --   --   --   ALKPHOS 107  --   --   --   --   BILITOT 1.9*  --   --   --   --   ALBUMIN 3.0*  < > 2.2* 2.1* 2.0*  < > = values in this interval not displayed. Cardiac Enzymes  Recent Labs Lab 08/02/2015 1825 07/20/2015 2120  TROPONINI 0.05* 0.11*   Glucose  Recent Labs Lab 07/20/15 1122 07/20/15 1710 07/20/15 2004 07/20/15 2348 07/21/15 0435 07/21/15 0749  GLUCAP 207* 196* 184* 255* 182* 210*    Imaging No results found.   ASSESSMENT / PLAN:  CARDIOVASCULAR CVL RIJ 11/2 (placed in ED) >> A:  PEA arrest - likely primary respiratory arrest r/t PNA +/- CHF Elevated Troponin I Diastolic Dysfunction - grade 1 noted on ECHO Acute on chronic CHF (Grade 1 DD on 09/2014 echo, LVEF 60%) H/O HTN & HLD ? Cor Pulmonale  P:  Cardiology following ASA 81mg  daily Hold outpatient amlodipine & lopressor given hypotension   PULMONARY OETT 11/2 >>> A: Acute Hypoxic Respiratory Failure - Due to CAP +/- component of pulmonary edema CAP Respiratory Alkalosis - Likely due to overventilation.  H/O COPD  P:   PS as tol - mental status does not support extubation  Intermittent CXR See ID section Duoneb q6hr PRN albuterol PRN ABG   NEUROLOGIC A:  Acute Encephalopathy - suspect anoxic H/O CVA  P:   RASS goal:  -1 to -2 Fentanyl gtt off since 4pm 11/3 Consider repeat CT head Hold outpt gabapentin Normothermia protocol  RENAL A:   Hyponatremia - Hypervolemic. Improving. Hypomagnesemia Hypokalemia - Resolved Lactic acidosis - Improving.  P:   Chem in am  Replace electrolytes as indicated   GASTROINTESTINAL A:   No acute issues.  P:   Protonix IV daily NPO Cont  TF  HEMATOLOGIC / ONCOLOGIC A:   Leukocytosis - Likely secondary to sepsis. VTE prophylaxis H/O Breast CA - s/p radiation  P:  Trend CBC  SCDs SQ heparin   INFECTIOUS A:   Sepsis - concern for CAP as source  CAP  P:   PCT Algorithm  Azithromycin 11/3>> Rocephin 11/3>>  Respiratory Viral PCR 11/3>>NEG Urine Strep Ag 11/3 >> neg  Urine Legionella Ag 11/3 >>neg Trach Asp Ctx  11/3>>few candida   Blood 11/02 >> Urine Ctx 11/02>>neg  ENDOCRINE A:  H/O DM Hypoglycemia - resolved   P:   Accuchecks q4hr Low dose SSI for coverage Cont TF   FAMILY UPDATE: - Family:  Family updated 11/5 by Dr. Hart Robinsons at bedside.  - Inter-disciplinary family meet or Palliative Care meeting due by:  07/23/15.  Need ongoing discussions with family.  Worsening neuro exam.  Dismal prognosis overall.    Nickolas Madrid, NP 07/21/2015  9:45 AM Pager: [phone] or [phone]

## 2015-07-22 ENCOUNTER — Other Ambulatory Visit (HOSPITAL_COMMUNITY): Payer: Commercial Managed Care - HMO

## 2015-07-22 ENCOUNTER — Inpatient Hospital Stay (HOSPITAL_COMMUNITY): Payer: Commercial Managed Care - HMO

## 2015-07-22 LAB — HEPATIC FUNCTION PANEL
ALBUMIN: 2 g/dL — AB (ref 3.5–5.0)
ALK PHOS: 63 U/L (ref 38–126)
ALT: 40 U/L (ref 14–54)
AST: 49 U/L — ABNORMAL HIGH (ref 15–41)
BILIRUBIN INDIRECT: 0.5 mg/dL (ref 0.3–0.9)
BILIRUBIN TOTAL: 0.7 mg/dL (ref 0.3–1.2)
Bilirubin, Direct: 0.2 mg/dL (ref 0.1–0.5)
Total Protein: 5.9 g/dL — ABNORMAL LOW (ref 6.5–8.1)

## 2015-07-22 LAB — CBC
HCT: 42.6 % (ref 36.0–46.0)
Hemoglobin: 13.1 g/dL (ref 12.0–15.0)
MCH: 25.6 pg — ABNORMAL LOW (ref 26.0–34.0)
MCHC: 30.8 g/dL (ref 30.0–36.0)
MCV: 83.4 fL (ref 78.0–100.0)
Platelets: 277 10*3/uL (ref 150–400)
RBC: 5.11 MIL/uL (ref 3.87–5.11)
RDW: 17.4 % — AB (ref 11.5–15.5)
WBC: 15.8 10*3/uL — ABNORMAL HIGH (ref 4.0–10.5)

## 2015-07-22 LAB — GLUCOSE, CAPILLARY
GLUCOSE-CAPILLARY: 184 mg/dL — AB (ref 65–99)
Glucose-Capillary: 186 mg/dL — ABNORMAL HIGH (ref 65–99)
Glucose-Capillary: 195 mg/dL — ABNORMAL HIGH (ref 65–99)
Glucose-Capillary: 164 mg/dL — ABNORMAL HIGH (ref 65–99)
Glucose-Capillary: 184 mg/dL — ABNORMAL HIGH (ref 65–99)

## 2015-07-22 LAB — BASIC METABOLIC PANEL
Anion gap: 9 (ref 5–15)
BUN: 12 mg/dL (ref 6–20)
CALCIUM: 8.4 mg/dL — AB (ref 8.9–10.3)
CO2: 32 mmol/L (ref 22–32)
CREATININE: 0.47 mg/dL (ref 0.44–1.00)
Chloride: 93 mmol/L — ABNORMAL LOW (ref 101–111)
GFR calc Af Amer: >60 mL/min (ref >60)
GLUCOSE: 188 mg/dL — AB (ref 65–99)
Potassium: 3.5 mmol/L (ref 3.5–5.1)
Sodium: 134 mmol/L — ABNORMAL LOW (ref 135–145)

## 2015-07-22 LAB — CULTURE, BLOOD (ROUTINE X 2)
CULTURE: NO GROWTH
Culture: NO GROWTH

## 2015-07-22 LAB — TSH: TSH: 3.75 u[IU]/mL (ref 0.350–4.500)

## 2015-07-22 LAB — MAGNESIUM: MAGNESIUM: 1.8 mg/dL (ref 1.7–2.4)

## 2015-07-22 MED ORDER — POTASSIUM CHLORIDE CRYS ER 20 MEQ PO TBCR: 40.0000 meq | EXTENDED_RELEASE_TABLET | Freq: Three times a day (TID) | ORAL | Status: DC

## 2015-07-22 MED ORDER — METOPROLOL TARTRATE 25 MG/10 ML ORAL SUSPENSION
25.0000 mg | Freq: Two times a day (BID) | ORAL | Status: DC
  Administered 2015-07-22 – 2015-07-25 (×8): 25 mg
  Filled 2015-07-22 (×8): qty 10

## 2015-07-22 MED ORDER — MAGNESIUM SULFATE 2 GM/50ML IV SOLN
2.0000 g | Freq: Once | INTRAVENOUS | Status: AC
  Administered 2015-07-22: 2 g via INTRAVENOUS
  Filled 2015-07-22: qty 50

## 2015-07-22 MED ORDER — ACETAMINOPHEN 160 MG/5ML PO SOLN
650.0000 mg | ORAL | Status: DC | PRN
  Administered 2015-07-22 – 2015-07-25 (×10): 650 mg
  Filled 2015-07-22 (×10): qty 20.3

## 2015-07-22 MED ORDER — FENTANYL CITRATE (PF) 100 MCG/2ML IJ SOLN
INTRAMUSCULAR | Status: AC
  Administered 2015-07-22: 50 ug via INTRAVENOUS
  Filled 2015-07-22: qty 2

## 2015-07-22 MED ORDER — POTASSIUM CHLORIDE 20 MEQ/15ML (10%) PO SOLN
40.0000 meq | Freq: Three times a day (TID) | ORAL | Status: AC
  Administered 2015-07-22 (×2): 40 meq
  Filled 2015-07-22 (×2): qty 30

## 2015-07-22 MED ORDER — FENTANYL CITRATE (PF) 100 MCG/2ML IJ SOLN
25.0000 ug | INTRAMUSCULAR | Status: DC | PRN
  Administered 2015-07-22 – 2015-07-26 (×4): 50 ug via INTRAVENOUS
  Filled 2015-07-22 (×3): qty 2

## 2015-07-22 NOTE — Progress Notes (Signed)
Spoke with Dr Armida Sans about repeat EEG; wants done after MRI. Spoke with nurse Junie Panning and MRI not until 5:00 PM. Dr Armida Sans advised to do EEG tomorrow.

## 2015-07-22 NOTE — Consult Note (Signed)
NEURO HOSPITALIST CONSULT NOTE   Referring physician: [place]   Reason for Consult: Prognostication  HPI:                                                                                                                                          Madison Todd is an 64 y.o. female with sudden onset shortness of breath, was severely dyspneic upon EMS arrival and suffered respiratory, then very brief PEA cardiac arrest in route to emergency department. Total downtime estimated at 1 minute. She was intubated, and had little neurologic activity post arrest. She underwent hyperthermic protocol. Rewarmed 11/3 at 2200 hours. She has been off continuous sedation  For about 2 days.  4 days prior patient was noted to be hyponatremic with NA 127. She does have a leukocytosis likely from sepsis with concern for CAP.   Past Medical History  Diagnosis Date  . Leukocytoclastic vasculitis ([place])   . Diabetes mellitus   . Breast cancer ([place])     left 2007/right 1997  . Left-sided Breast cancer 07/22/2011  . Cardiomyopathy 07/22/2011  . COPD (chronic obstructive pulmonary disease) ([place]) 07/22/2011  . Hypertension   . Hyperlipidemia   . 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  Stage II (T2 N0).  ER 73%, PR 90%, Her2 positive, Ki-67 19%.  2.1 cm in size.  Agreed to only have radiation, Herceptin, and Aromasin.  Right sided breast cancer: Stage II (T2 N0 M0).  2.5 cm in size.  Surgery on 04/28/1996 with axillary dissection on 05/10/1996.  11 negative nodes.  Treated with AC x 4 cycles for ER negative, PR positive at only 20%.    . Breast cancer, right breast ([place]) 12/16/2013  . Stroke [facility])     Past Surgical History  Procedure Laterality Date  . Mastectomy, radical      right  . Breast lumpectomy      left  . Port-a-cath removal    . Colonoscopy  07/20/2005    RMR: Diminutive ascending colon polyp, status post resection .  Inflamed focally adenomatous polyp  . Colonoscopy N/A 10/05/2013    RMR: Multiple rectal and colonic polyps-removed/treated. 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 other polyps tubular adenomas  . Flexible sigmoidoscopy N/A 10/11/2013    RMR: rectal polypectomy site identified, assitional resection and tatooing performed. 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 Flexible sigmoidoscopy N/A 11/24/2013    Procedure: FLEXIBLE SIGMOIDOSCOPY;  Surgeon: Daneil Dolin, MD;  Location: [facility];  Service: Endoscopy;  Laterality: N/A;  1:00-moved to [place] notified pt    Family History  Problem Relation Age of Onset  . Diabetes Father   .  Cancer Maternal Aunt   . Cancer Paternal Aunt   . Colon cancer Neg Hx      Social History:  reports that she has been smoking Cigarettes.  She has a 9 pack-year smoking history. She has never used smokeless tobacco. She reports that she does not drink alcohol or use illicit drugs.  Allergies  Allergen Reactions  . Crestor [Rosuvastatin]     Leg cramps  . Lipitor [Atorvastatin]     Leg cramps   . Lisinopril Other (See Comments)    Tongue swelling  . Aromasin [Exemestane] Rash    MEDICATIONS:                                                                                                                     Prior to Admission:  Prescriptions prior to admission  Medication Sig Dispense Refill Last Dose  . amLODipine (NORVASC) 10 MG tablet Take 1 tablet (10 mg total) by mouth daily. (Patient taking differently: Take 10 mg by mouth every other day. ) 30 tablet 1 Past Week at Unknown time  . aspirin EC 81 MG tablet Take 81 mg by mouth daily.   08/09/2015 at Unknown time  . cholecalciferol (VITAMIN D) 1000 UNITS tablet Take 1,000 Units by mouth daily.   08/05/2015 at Unknown time  . Insulin Degludec (TRESIBA FLEXTOUCH) 200 UNIT/ML SOPN Inject 200 Units into the skin daily.    unknown  . metoprolol tartrate (LOPRESSOR) 25 MG  tablet Take 0.5 tablets (12.5 mg total) by mouth 2 (two) times daily. 60 tablet 1 07/28/2015 at 800  . acetaminophen (TYLENOL) 325 MG tablet Take 325 mg by mouth at bedtime as needed (help relax, sleep).    unknown  . gabapentin (NEURONTIN) 100 MG capsule Take 1 capsule (100 mg total) by mouth 2 (two) times daily. (Patient not taking: Reported on 07/28/2015) 60 capsule 1    Scheduled: . antiseptic oral rinse  7 mL Mouth Rinse 10 times per day  . aspirin  81 mg Per Tube Daily  . azithromycin  500 mg Intravenous QHS  . cefTRIAXone (ROCEPHIN)  IV  1 g Intravenous QHS  . chlorhexidine gluconate  15 mL Mouth Rinse BID  . heparin  5,000 Units Subcutaneous 3 times per day  . insulin aspart  0-9 Units Subcutaneous 6 times per day  . ipratropium-albuterol  3 mL Nebulization Q6H  . magnesium sulfate 1 - 4 g bolus IVPB  2 g Intravenous Once  . metoprolol tartrate  25 mg Per Tube BID  . pantoprazole (PROTONIX) IV  40 mg Intravenous QHS  . potassium chloride  40 mEq Oral TID     ROS:  History obtained from unobtainable from patient due to mental status   Blood pressure 164/72, pulse 106, temperature 100.8 F (38.2 C), temperature source Other (Comment), resp. rate 20, height 5' 8"  (1.727 m), weight 82.6 kg (182 lb 1.6 oz), SpO2 97 %.   Physical Examination:                                                                                                      HEENT-  Normocephalic, no lesions, without obvious abnormality.  Normal external eye and conjunctiva.  Normal TM's bilaterally.  Normal auditory canals and external ears. Normal external nose, mucus membranes and septum.  Normal pharynx. Cardiovascular- S1, S2 normal, pulses palpable throughout   Lungs- chest clear, no wheezing, rales, normal symmetric air entry Abdomen- normal findings: bowel sounds  normal Extremities- no edema Lymph-no adenopathy palpable Musculoskeletal-no joint tenderness, deformity or swelling Skin-warm and dry, no hyperpigmentation, vitiligo, or suspicious lesions  Neurological Examination Mental Status: Patient does not respond to verbal stimuli.  Does not respond to deep sternal rub.  Does not follow commands.  No verbalizations noted.  Cranial Nerves: II: patient does not respond confrontation bilaterally, pupils right 3 mm and brisk, left 3 mm and sluggish but reactive bilaterally III,IV,VI: doll's response present bilaterally.  V,VII: corneal reflex absent on the right and present on the left VIII: patient does not respond to verbal stimuli IX,X: gag reflex absent, but cough intact XI: trapezius strength unable to test bilaterally XII: tongue strength unable to test Motor: Extremities flaccid throughout.  No spontaneous movement noted.  No purposeful movements noted. Sensory: Does respond to noxious stimuli in legs only. Deep Tendon Reflexes:  Present in throughout UE 2+ and KJ  Plantars: equivocal bilaterally Cerebellar: Unable to perform      Lab Results: Basic Metabolic Panel:  Recent Labs Lab 07/18/15 1500  07/19/15 0350 07/19/15 1540 07/20/15 0426 07/20/15 1437 07/21/15 0410 07/22/15 0415  NA  --   < > 128* 133* 130* 130* 133*  134* 134*  K  --   < > 4.2 3.9 3.7 3.6 3.9  3.9 3.5  CL  --   < > 91* 91* 91* 92* 93*  93* 93*  CO2  --   < > 26 27 30 28  32  32 32  GLUCOSE  --   < > 93 107* 154* 219* 195*  193* 188*  BUN  --   < > 17 13 14 13 10  10 12   CREATININE  --   < > 0.66 0.79 0.76 0.70 0.57  0.56 0.47  CALCIUM  --   < > 8.0* 8.4* 7.9* 8.0* 8.4*  8.4* 8.4*  MG 3.0*  --  2.4  --  1.9  --  1.6* 1.8  PHOS  --   < > 4.9* 3.5 3.1 2.0* 2.3*  --   < > = values in this interval not displayed.  Liver Function Tests:  Recent Labs Lab 08/13/2015 1825  07/19/15 0350 07/19/15 1540 07/20/15 0426 07/20/15 1437 07/21/15 0410   AST 123*  --   --   --   --   --   --  ALT 78*  --   --   --   --   --   --   ALKPHOS 107  --   --   --   --   --   --   BILITOT 1.9*  --   --   --   --   --   --   PROT 6.9  --   --   --   --   --   --   ALBUMIN 3.0*  < > 2.5* 2.4* 2.2* 2.1* 2.0*  < > = values in this interval not displayed. No results for input(s): LIPASE, AMYLASE in the last 168 hours. No results for input(s): AMMONIA in the last 168 hours.  CBC:  Recent Labs Lab 07/25/2015 1825 07/18/15 0906 07/19/15 0350 07/20/15 0426 07/21/15 0410 07/22/15 0415  WBC 17.0* 29.0* 32.4* 23.3* 19.3* 15.8*  NEUTROABS 14.4*  --  29.5* 20.3*  --   --   HGB 15.4* 16.6* 16.1* 14.2 13.5 13.1  HCT 47.8* 50.5* 50.2* 45.2 42.5 42.6  MCV 83.4 81.1 83.4 82.8 81.9 83.4  PLT 308 335 313 271 267 277    Cardiac Enzymes:  Recent Labs Lab 08/11/2015 1825 08/09/2015 2120  TROPONINI 0.05* 0.11*    Lipid Panel: No results for input(s): CHOL, TRIG, HDL, CHOLHDL, VLDL, LDLCALC in the last 168 hours.  CBG:  Recent Labs Lab 07/21/15 1554 07/21/15 2020 07/21/15 2348 07/22/15 0353 07/22/15 0745  GLUCAP 177* 169* 233* 184* 186*    Microbiology: Results for orders placed or performed during the hospital encounter of 07/21/2015  Blood Culture (routine x 2)     Status: None (Preliminary result)   Collection Time: 07/31/2015  6:20 PM  Result Value Ref Range Status   Specimen Description BLOOD CENTRAL LINE DRAWN BY RN  Final   Special Requests BOTTLES DRAWN AEROBIC AND ANAEROBIC 4CC EACH  Final   Culture NO GROWTH 4 DAYS  Final   Report Status PENDING  Incomplete  Blood Culture (routine x 2)     Status: None (Preliminary result)   Collection Time: 07/25/2015  6:33 PM  Result Value Ref Range Status   Specimen Description BLOOD RIGHT HAND  Final   Special Requests BOTTLES DRAWN AEROBIC AND ANAEROBIC [place]  Final   Culture NO GROWTH 4 DAYS  Final   Report Status PENDING  Incomplete  Urine culture     Status: None   Collection Time:  07/31/2015  7:24 PM  Result Value Ref Range Status   Specimen Description URINE, CATHETERIZED  Final   Special Requests NONE  Final   Culture   Final    NO GROWTH 1 DAY Performed at [facility]    Report Status 07/19/2015 FINAL  Final  Culture, respiratory (NON-Expectorated)     Status: None   Collection Time: 07/18/15 12:15 PM  Result Value Ref Range Status   Specimen Description TRACHEAL ASPIRATE  Final   Special Requests Normal  Final   Gram Stain   Final    ABUNDANT WBC PRESENT,BOTH PMN AND MONONUCLEAR RARE SQUAMOUS EPITHELIAL CELLS PRESENT RARE GRAM POSITIVE COCCI IN PAIRS RARE GRAM NEGATIVE RODS Performed at [facility]    Culture   Final    FEW CANDIDA ALBICANS Performed at [facility]    Report Status 07/20/2015 FINAL  Final  MRSA PCR Screening     Status: None   Collection Time: 07/19/15  9:20 AM  Result Value Ref Range Status  MRSA by PCR NEGATIVE NEGATIVE Final    Comment:        The GeneXpert MRSA Assay (FDA approved for NASAL specimens only), is one component of a comprehensive MRSA colonization surveillance program. It is not intended to diagnose MRSA infection nor to guide or monitor treatment for MRSA infections.   Respiratory virus panel     Status: None   Collection Time: 07/19/15  9:57 AM  Result Value Ref Range Status   Source - RVPAN NASAL SWAB  Corrected   Respiratory Syncytial Virus A Negative Negative Final   Respiratory Syncytial Virus B Negative Negative Final   Influenza A Negative Negative Final   Influenza B Negative Negative Final   Parainfluenza 1 Negative Negative Final   Parainfluenza 2 Negative Negative Final   Parainfluenza 3 Negative Negative Final   Metapneumovirus Negative Negative Final   Rhinovirus Negative Negative Final   Adenovirus Negative Negative Final    Comment: (NOTE) Performed At: [facility] [address] [place], [name] [id number] Lindon Romp MD HC:[id number]      Coagulation Studies: No results for input(s): LABPROT, INR in the last 72 hours.  Imaging: No results found.     Assessment and plan per attending neurologist  Etta Quill PA-C [facility] [phone]  07/22/2015, 10:38 AM   Assessment/Plan: 64 YO female with PEA after respiratory compromise. PEA for 1-2 minutes and then was placed on hypothermic protocol.  She is now 3+ days out with no continuous sedation for 2 days.  Exam is notable for preserved corneal on the left but not right, sluggish pupil on the left but brisk on the right. Intact cough but no gag, breathing over the vent. 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 Due to multitude of neurologic findings prognosis is guarded and will make further recommendations after EEG and MRI brain.   Recommend: 1) EEG 2) MRI brain 3) TSH, Ammonia  Patient seen and examined together with physician assistant and I concur with the assessment and plan.  Dorian Pod, MD

## 2015-07-22 NOTE — Progress Notes (Signed)
Pt transported to/from MRI without event.

## 2015-07-22 NOTE — Care Management Important Message (Signed)
Important Message  Patient Details  Name: Madison Todd MRN: [id number] Date of Birth: [birth year]   Medicare Important Message Given:  Yes-fourth notification given    Loann Quill 07/22/2015, 1:31 PM

## 2015-07-22 NOTE — Progress Notes (Signed)
PULMONARY / CRITICAL CARE MEDICINE   Name: Madison Todd MRN: [id number] DOB: 12/19/1950    ADMISSION DATE:  08/11/2015 CONSULTATION DATE:  08/14/2015  REFERRING MD :  Forestine Na EDP  CHIEF COMPLAINT:  Cardiac arrest  INITIAL PRESENTATION: 64 year old female with sudden onset shortness of breath, was severely dyspneic upon EMS arrival and suffered respiratory, then very brief PEA cardiac arrest in route to emergency department. Total downtime estimated at 1 minute. She was intubated, and had little neurologic activity post arrest. Transferred to [facility] for ICU admission and possible therapeutic hypothermia.  STUDIES:  CXR 11/2 - CHF, asymmetric edema R > L CT head 11/2 - no acute findings, remote Rt medullary infarct CTA chest 11/2 - tiny bilateral pleural effusions with lower lobe & left upper lobe atelectasis versus opacity. Mild interstitial edema. EEG 11/3  - abnormal with markedly suppressed diffuse cerebral activity with generalized slowing which may be related to sedation, encephalopathic state can not be ruled out, no seizures.  ECHO 11/3 - moderate LVH, LVEF 60-65%, no RWMA, grade 1 diastolic dysfunction, PA peak 45 mm hg EEG 11/5>>> generalized slowing c/w encephalopathy, no seizure.   SIGNIFICANT EVENTS: 11/02  Taken to [facility] ED after brief PEA cardiac arrest > transferred to [facility] for consideration of therapeutic hypothermia. 11/03  Desaturations, clenching ETT, increased O2 to 60%/PEEP 10   SUBJECTIVE:   Off sedation.  Remains unresponsive.  Has resp drive - tolerating PS 10/5.  Off pressors.   VITAL SIGNS: Temp:  [97.7 F (36.5 C)-100.4 F (38 C)] 100.2 F (37.9 C) (11/07 0600) Pulse Rate:  [98-118] 111 (11/07 0719) Resp:  [16-24] 17 (11/07 0719) BP: (135-177)/(57-72) 150/65 mmHg (11/07 0405) SpO2:  [93 %-98 %] 95 % (11/07 0719) Arterial Line BP: (106-191)/(49-103) 163/69 mmHg (11/07 0600) FiO2 (%):  [40 %] 40 % (11/07 0719) Weight:  [82.6 kg (182  lb 1.6 oz)] 82.6 kg (182 lb 1.6 oz) (11/07 0200)   HEMODYNAMICS: CVP:  [5 mmHg-11 mmHg] 11 mmHg   VENTILATOR SETTINGS: Vent Mode:  [-] PRVC FiO2 (%):  [40 %] 40 % Set Rate:  [16 bmp] 16 bmp Vt Set:  [510 mL] 510 mL PEEP:  [5 cmH20] 5 cmH20 Pressure Support:  [10 cmH20] 10 cmH20 Plateau Pressure:  [19 cmH20-25 cmH20] 25 cmH20   INTAKE / OUTPUT:  Intake/Output Summary (Last 24 hours) at 07/22/15 1003 Last data filed at 07/22/15 0600  Gross per 24 hour  Intake   2060 ml  Output   2380 ml  Net   -320 ml    PHYSICAL EXAMINATION: General:  Adult female in NAD on vent, gag intact but no other reflexed noted. Integument:  Warm & dry. No rash on exposed skin.  R mastectomy noted.  HEENT:  No scleral injection or icterus. Sclera edema noted. ETT Cardiovascular:  Regular rate. Edema. No appreciable JVD.  Pulmonary:  resps even non labored on PS 10/5, few scattered rhonchi  Abdomen: Soft. Normal bowel sounds. Nondistended.  Neurological: Single dose of versed 2 mg given at 8 AM for agitation, gag intact but no other reflexes noted.  LABS:  CBC  Recent Labs Lab 07/20/15 0426 07/21/15 0410 07/22/15 0415  WBC 23.3* 19.3* 15.8*  HGB 14.2 13.5 13.1  HCT 45.2 42.5 42.6  PLT 271 267 277   Coag's  Recent Labs Lab 07/28/2015 2120 07/18/15 0440  APTT 31  --   INR 1.29 1.26    BMET  Recent Labs Lab 07/20/15 1437 07/21/15  0410 07/22/15 0415  NA 130* 133*  134* 134*  K 3.6 3.9  3.9 3.5  CL 92* 93*  93* 93*  CO2 28 32  32 32  BUN 13 10  10 12   CREATININE 0.70 0.57  0.56 0.47  GLUCOSE 219* 195*  193* 188*   Electrolytes  Recent Labs Lab 07/20/15 0426 07/20/15 1437 07/21/15 0410 07/22/15 0415  CALCIUM 7.9* 8.0* 8.4*  8.4* 8.4*  MG 1.9  --  1.6* 1.8  PHOS 3.1 2.0* 2.3*  --    Sepsis Markers  Recent Labs Lab 08/08/2015 1826 07/18/15 0012 07/18/15 1300 07/19/15 0350 07/20/15 0426  LATICACIDVEN 8.65* 2.9*  --  1.6  --   PROCALCITON  --   --  13.11 8.19  3.11   ABG  Recent Labs Lab 07/18/15 0257 07/18/15 1400 07/19/15 1200  PHART 7.503* 7.344* 7.394  PCO2ART 34.8* 48.4* 44.3  PO2ART 40.0* 77.0* 74.7*   Liver Enzymes  Recent Labs Lab 08/01/2015 1825  07/20/15 0426 07/20/15 1437 07/21/15 0410  AST 123*  --   --   --   --   ALT 78*  --   --   --   --   ALKPHOS 107  --   --   --   --   BILITOT 1.9*  --   --   --   --   ALBUMIN 3.0*  < > 2.2* 2.1* 2.0*  < > = values in this interval not displayed. Cardiac Enzymes  Recent Labs Lab 07/24/2015 1825 07/20/2015 2120  TROPONINI 0.05* 0.11*   Glucose  Recent Labs Lab 07/21/15 1224 07/21/15 1554 07/21/15 2020 07/21/15 2348 07/22/15 0353 07/22/15 0745  GLUCAP 193* 177* 169* 233* 184* 186*    Imaging No results found.   ASSESSMENT / PLAN:  CARDIOVASCULAR CVL RIJ 11/2 (placed in ED) >> A:  PEA arrest - likely primary respiratory arrest r/t PNA +/- CHF Elevated Troponin I Diastolic Dysfunction - grade 1 noted on ECHO Acute on chronic CHF (Grade 1 DD on 09/2014 echo, LVEF 60%) H/O HTN & HLD ? Cor Pulmonale  P:  Cardiology following ASA 81mg  daily Restart lopressor, hold norvasc for now.  PULMONARY OETT 11/2 >>> A: Acute Hypoxic Respiratory Failure - Due to CAP +/- component of pulmonary edema CAP Respiratory Alkalosis - Likely due to overventilation.  H/O COPD  P:   Intermittent CXR See ID section Duoneb q6hr PRN albuterol PRN ABG  Start PS trials but no extubation given mental status.  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 Neuro consult called. Hold outpt gabapentin Normothermia protocol  RENAL A:   Hyponatremia - Hypervolemic. Improving. Hypomagnesemia Hypokalemia - Resolved Lactic acidosis - Improving.  P:   Chem in am  Replace electrolytes as indicated  KVO IVF  GASTROINTESTINAL A:   No acute issues.  P:   Protonix IV  daily Cont TF  HEMATOLOGIC / ONCOLOGIC A:   Leukocytosis - Likely secondary to sepsis. VTE prophylaxis H/O Breast CA - s/p radiation  P:  Trend CBC  SCDs SQ heparin   INFECTIOUS A:   Sepsis - concern for CAP as source  CAP  P:   PCT Algorithm  Azithromycin 11/3>> Rocephin 11/3>>  Respiratory Viral PCR 11/3>>NEG Urine Strep Ag 11/3 >> neg  Urine Legionella Ag 11/3 >>neg Trach Asp Ctx 11/3>>few candida   Blood  11/02 >>NTD Urine Ctx 11/02>>neg  ENDOCRINE A:  H/O DM Hypoglycemia - resolved   P:   Accuchecks q4hr Low dose SSI for coverage Cont TF  FAMILY UPDATE: - Family:  No family bedside.  - Inter-disciplinary family meet or Palliative Care meeting due by:  07/23/15.  Neuro consult called, head CT ordered, begin weaning trials but no extubation given mental status.  Will await prognostication from neuro prior to speaking with family.  The patient is critically ill with multiple organ systems failure and requires high complexity decision making for assessment and support, frequent evaluation and titration of therapies, application of advanced monitoring technologies and extensive interpretation of multiple databases.   Critical Care Time devoted to patient care services described in this note is  35  Minutes. This time reflects time of care of this signee Dr Jennet Maduro. This critical care time does not reflect procedure time, or teaching time or supervisory time of PA/NP/Med student/Med Resident etc but could involve care discussion time.  Rush Farmer, M.D. [facility] Pulmonary/Critical Care Medicine. Pager: [phone]. After hours pager: [phone].

## 2015-07-23 ENCOUNTER — Inpatient Hospital Stay (HOSPITAL_COMMUNITY): Payer: Commercial Managed Care - HMO

## 2015-07-23 DIAGNOSIS — J810 Acute pulmonary edema: Secondary | ICD-10-CM

## 2015-07-23 DIAGNOSIS — I469 Cardiac arrest, cause unspecified: Secondary | ICD-10-CM

## 2015-07-23 LAB — CBC
HCT: 41.8 % (ref 36.0–46.0)
HEMOGLOBIN: 13 g/dL (ref 12.0–15.0)
MCH: 25.8 pg — AB (ref 26.0–34.0)
MCHC: 31.1 g/dL (ref 30.0–36.0)
MCV: 82.9 fL (ref 78.0–100.0)
Platelets: 262 10*3/uL (ref 150–400)
RBC: 5.04 MIL/uL (ref 3.87–5.11)
RDW: 17.7 % — ABNORMAL HIGH (ref 11.5–15.5)
WBC: 17.5 10*3/uL — ABNORMAL HIGH (ref 4.0–10.5)

## 2015-07-23 LAB — GLUCOSE, CAPILLARY
GLUCOSE-CAPILLARY: 173 mg/dL — AB (ref 65–99)
GLUCOSE-CAPILLARY: 192 mg/dL — AB (ref 65–99)
GLUCOSE-CAPILLARY: 201 mg/dL — AB (ref 65–99)
Glucose-Capillary: 153 mg/dL — ABNORMAL HIGH (ref 65–99)
Glucose-Capillary: 164 mg/dL — ABNORMAL HIGH (ref 65–99)

## 2015-07-23 LAB — BASIC METABOLIC PANEL
Anion gap: 8 (ref 5–15)
BUN: 13 mg/dL (ref 6–20)
CHLORIDE: 97 mmol/L — AB (ref 101–111)
CO2: 30 mmol/L (ref 22–32)
CREATININE: 0.47 mg/dL (ref 0.44–1.00)
Calcium: 8.7 mg/dL — ABNORMAL LOW (ref 8.9–10.3)
GFR calc Af Amer: >60 mL/min (ref >60)
GFR calc non Af Amer: >60 mL/min (ref >60)
GLUCOSE: 188 mg/dL — AB (ref 65–99)
POTASSIUM: 4.5 mmol/L (ref 3.5–5.1)
Sodium: 135 mmol/L (ref 135–145)

## 2015-07-23 LAB — MAGNESIUM: Magnesium: 1.8 mg/dL (ref 1.7–2.4)

## 2015-07-23 LAB — PHOSPHORUS: Phosphorus: 3.1 mg/dL (ref 2.5–4.6)

## 2015-07-23 MED ORDER — SODIUM CHLORIDE 0.9 % IV SOLN
500.0000 mg | Freq: Two times a day (BID) | INTRAVENOUS | Status: DC
  Filled 2015-07-23: qty 5

## 2015-07-23 MED ORDER — SODIUM CHLORIDE 0.9 % IV SOLN
1000.0000 mg | Freq: Once | INTRAVENOUS | Status: AC
  Administered 2015-07-23: 1000 mg via INTRAVENOUS
  Filled 2015-07-23: qty 10

## 2015-07-23 MED ORDER — SODIUM CHLORIDE 0.9 % IV SOLN
1000.0000 mg | Freq: Two times a day (BID) | INTRAVENOUS | Status: DC
  Administered 2015-07-23 – 2015-07-26 (×6): 1000 mg via INTRAVENOUS
  Filled 2015-07-23 (×8): qty 10

## 2015-07-23 MED ORDER — SODIUM CHLORIDE 0.9 % IV SOLN
200.0000 mg | INTRAVENOUS | Status: AC
Start: 2015-07-23 — End: 2015-07-23
  Administered 2015-07-23: 200 mg via INTRAVENOUS
  Filled 2015-07-23: qty 20

## 2015-07-23 MED ORDER — LORAZEPAM 2 MG/ML IJ SOLN
2.0000 mg | Freq: Once | INTRAMUSCULAR | Status: AC
  Administered 2015-07-23: 2 mg via INTRAVENOUS
  Filled 2015-07-23: qty 1

## 2015-07-23 MED ORDER — MIDAZOLAM HCL 2 MG/2ML IJ SOLN
1.0000 mg | INTRAMUSCULAR | Status: DC | PRN
  Administered 2015-07-23: 4 mg via INTRAVENOUS

## 2015-07-23 NOTE — Progress Notes (Signed)
[place] Progress Note Patient Name: Madison Todd DOB: 1950-10-29 MRN: [id number]   Date of Service  07/23/2015  HPI/Events of Note  Camera check on patient shows intermitted left arm twitching suggestive of seizure activity.  Recent MRI shows only old infarct.  Plan of EEG off sedation.  eICU Interventions  Plan; For now increase in PRN versed to 1 to 4 mg IV q1hour May need to consider Keppra and neuro consult     Intervention Category Major Interventions: Seizures - evaluation and management  Linda Grimmer 07/23/2015, 1:40 AM

## 2015-07-23 NOTE — Progress Notes (Signed)
Pt. Started having left arm twitches at approximately 2200 on the way down to MRI.  E-Link nurse notified and is talking with doctor.

## 2015-07-23 NOTE — Progress Notes (Signed)
PULMONARY / CRITICAL CARE MEDICINE   Name: Madison Todd MRN: [id number] DOB: May 25, 1951    ADMISSION DATE:  08/14/2015 CONSULTATION DATE:  08/02/2015  REFERRING MD :  Forestine Na EDP  CHIEF COMPLAINT:  Cardiac arrest  INITIAL PRESENTATION: 64 year old female with sudden onset shortness of breath, was severely dyspneic upon EMS arrival and suffered respiratory, then very brief PEA cardiac arrest in route to emergency department. Total downtime estimated at 1 minute. She was intubated, and had little neurologic activity post arrest. Transferred to [facility] for ICU admission and possible therapeutic hypothermia.  STUDIES:  CXR 11/2 - CHF, asymmetric edema R > L CT head 11/2 - no acute findings, remote Rt medullary infarct CTA chest 11/2 - tiny bilateral pleural effusions with lower lobe & left upper lobe atelectasis versus opacity. Mild interstitial edema. EEG 11/3  - abnormal with markedly suppressed diffuse cerebral activity with generalized slowing which may be related to sedation, encephalopathic state can not be ruled out, no seizures.  ECHO 11/3 - moderate LVH, LVEF 60-65%, no RWMA, grade 1 diastolic dysfunction, PA peak 45 mm hg EEG 11/5>>> generalized slowing c/w encephalopathy, no seizure.   SIGNIFICANT EVENTS: 11/02  Taken to [facility] ED after brief PEA cardiac arrest > transferred to [facility] for consideration of therapeutic hypothermia. 11/03  Desaturations, clenching ETT, increased O2 to 60%/PEEP 10  SUBJECTIVE:   Off sedation.  Seizure activity overnight and this AM.  VITAL SIGNS: Temp:  [99.6 F (37.6 C)-102.9 F (39.4 C)] 99.6 F (37.6 C) (11/08 1030) Pulse Rate:  [92-117] 96 (11/08 1100) Resp:  [16-30] 16 (11/08 1100) BP: (145-179)/(63-97) 145/63 mmHg (11/08 0837) SpO2:  [94 %-100 %] 97 % (11/08 1100) Arterial Line BP: (116-191)/(56-89) 123/59 mmHg (11/08 1100) FiO2 (%):  [40 %] 40 % (11/08 0840) Weight:  [83.9 kg (184 lb 15.5 oz)] 83.9 kg (184 lb 15.5 oz)  (11/08 0200)   HEMODYNAMICS: CVP:  [5 mmHg-16 mmHg] 5 mmHg   VENTILATOR SETTINGS: Vent Mode:  [-] PRVC FiO2 (%):  [40 %] 40 % Set Rate:  [16 bmp] 16 bmp Vt Set:  [510 mL] 510 mL PEEP:  [5 cmH20] 5 cmH20 Pressure Support:  [10 cmH20] 10 cmH20 Plateau Pressure:  [19 cmH20-25 cmH20] 24 cmH20   INTAKE / OUTPUT:  Intake/Output Summary (Last 24 hours) at 07/23/15 1155 Last data filed at 07/23/15 1145  Gross per 24 hour  Intake   2270 ml  Output   1210 ml  Net   1060 ml    PHYSICAL EXAMINATION: General:  Adult female in NAD on vent, gag intact but no other reflexed noted. Integument:  Warm & dry. No rash on exposed skin.  R mastectomy noted.  HEENT:  No scleral injection or icterus. Sclera edema noted. ETT Cardiovascular:  Regular rate. Edema. No appreciable JVD.  Pulmonary:  resps even non labored on PS 10/5, few scattered rhonchi  Abdomen: Soft. Normal bowel sounds. Nondistended.  Neurological: Single dose of versed 2 mg given at 8 AM for agitation, gag intact but no other reflexes noted.  LABS:  CBC  Recent Labs Lab 07/21/15 0410 07/22/15 0415 07/23/15 0445  WBC 19.3* 15.8* 17.5*  HGB 13.5 13.1 13.0  HCT 42.5 42.6 41.8  PLT 267 277 262   Coag's  Recent Labs Lab 07/31/2015 2120 07/18/15 0440  APTT 31  --   INR 1.29 1.26    BMET  Recent Labs Lab 07/21/15 0410 07/22/15 0415 07/23/15 0445  NA 133*  134* 134*  135  K 3.9  3.9 3.5 4.5  CL 93*  93* 93* 97*  CO2 32  32 32 30  BUN _0 CREATININE 0.57  0.56 0.47 0.47  GLUCOSE 195*  193* 188* 188*   Electrolytes  Recent Labs Lab 07/20/15 1437 07/21/15 0410 07/22/15 0415 07/23/15 0445  CALCIUM 8.0* 8.4*  8.4* 8.4* 8.7*  MG  --  1.6* 1.8 1.8  PHOS 2.0* 2.3*  --  3.1   Sepsis Markers  Recent Labs Lab 07/21/2015 1826 07/18/15 0012 07/18/15 1300 07/19/15 0350 07/20/15 0426  LATICACIDVEN 8.65* 2.9*  --  1.6  --   PROCALCITON  --   --  13.11 8.19 3.11   ABG  Recent Labs Lab  07/18/15 0257 07/18/15 1400 07/19/15 1200  PHART 7.503* 7.344* 7.394  PCO2ART 34.8* 48.4* 44.3  PO2ART 40.0* 77.0* 74.7*   Liver Enzymes  Recent Labs Lab 08/13/2015 1825  07/20/15 1437 07/21/15 0410 07/22/15 1130  AST 123*  --   --   --  49*  ALT 78*  --   --   --  40  ALKPHOS 107  --   --   --  63  BILITOT 1.9*  --   --   --  0.7  ALBUMIN 3.0*  < > 2.1* 2.0* 2.0*  < > = values in this interval not displayed. Cardiac Enzymes  Recent Labs Lab 07/20/2015 1825 08/07/2015 2120  TROPONINI 0.05* 0.11*   Glucose  Recent Labs Lab 07/22/15 0745 07/22/15 1158 07/22/15 1609 07/22/15 2044 07/23/15 0008 07/23/15 0350  GLUCAP 186* 195* 164* 184* 164* 153*    Imaging Mr Brain Wo Contrast  07/22/2015  CLINICAL DATA:  Dyspnea and cardiac arrest, status post hypothermic protocol. Off sedation for 2 days, altered mental status. History of vasculitis, diabetes, breast cancer, hypertension, hyperlipidemia and stroke. EXAM: MRI HEAD WITHOUT CONTRAST TECHNIQUE: Multiplanar, multiecho pulse sequences of the brain and surrounding structures were obtained without intravenous contrast. COMPARISON:  CT head July 17, 2015 and MRI of the brain September 28, 2014 FINDINGS: Faint symmetric reduced diffusion within the bilateral occipital lobes, frontoparietal lobes with corresponding low ADC values. Faint reduced diffusion LEFT posterior temporal lobe. Very faint corresponding FLAIR T2 hyperintense signal. Generally bright basal ganglia and thalamus. Focal RIGHT medullary encephalomalacia corresponding to old infarct. No susceptibility artifact to suggest hemorrhage. A few scattered subcentimeter supratentorial white matter T2 hyperintensities most compatible with chronic small vessel ischemic disease, similar. Ventricles and sulci are normal for patient's age. No midline shift, mass effect or mass lesions. No abnormal extra-axial fluid collections. Normal major intracranial vascular flow voids seen at the  skull base. Moderate paranasal sinus mucosal thickening, layering secretions in the nasopharynx with life-support lines in place. Moderate bilateral mastoid effusions. No abnormal sellar expansion. No cerebellar tonsillar ectopia. No abnormal calvarial bone marrow signal. 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 Mild white matter changes compatible chronic small vessel ischemic disease. Electronically Signed   By: Elon Alas M.D.   On: 07/22/2015 23:27   Dg Chest Port 1 View  07/23/2015  CLINICAL DATA:  Anoxia. EXAM: PORTABLE CHEST 1 VIEW COMPARISON:  07/20/2015. FINDINGS: Endotracheal tube, right IJ line, feeding tube in stable position. Mediastinum hilar structures are unremarkable. Stable cardiomegaly. Interval partial clearing of bilateral pulmonary edema and/or infiltrates. No prominent pleural effusion  or pneumothorax. IMPRESSION: 1. Lines and tubes in stable position. 2. Cardiomegaly with interim partial clearing of bilateral pulmonary edema and or pulmonary infiltrates. Electronically Signed   By: Marcello Moores  Register   On: 07/23/2015 07:22     ASSESSMENT / PLAN:  CARDIOVASCULAR CVL RIJ 11/2 (placed in ED) >> A:  PEA arrest - likely primary respiratory arrest r/t PNA +/- CHF Elevated Troponin I Diastolic Dysfunction - grade 1 noted on ECHO Acute on chronic CHF (Grade 1 DD on 09/2014 echo, LVEF 60%) H/O HTN & HLD ? Cor Pulmonale  P:  Cardiology following. ASA 72m daily. Continue lopressor, hold norvasc for now. Tele monitoring.  PULMONARY OETT 11/2 >>> A: Acute Hypoxic Respiratory Failure - Due to CAP +/- component of pulmonary edema CAP Respiratory Alkalosis - Likely due to overventilation.  H/O COPD  P:   Intermittent CXR See ID section Duoneb q6hr PRN albuterol PRN ABG  No  weaning given neuro changes.  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 Neuro consult appreciated. Continuous EEG. Keppra increased. Vimpat ordered. PRN order of ativan. Hold outpt gabapentin. Normothermia protocol.  RENAL A:   Hyponatremia - Hypervolemic. Improving. Hypomagnesemia Hypokalemia - Resolved Lactic acidosis - Improving.  P:   Chem in am. Replace electrolytes as indicated. KVO IVF.  GASTROINTESTINAL A:   No acute issues.  P:   Protonix IV daily. Cont TF.  HEMATOLOGIC / ONCOLOGIC A:   Leukocytosis - Likely secondary to sepsis. VTE prophylaxis H/O Breast CA - s/p radiation  P:  Trend CBC. SCDs. SQ heparin.  INFECTIOUS A:   Sepsis - concern for CAP as source  CAP  P:   PCT 3.11.  Azithromycin 11/3>> Rocephin 11/3>>  Respiratory Viral PCR 11/3>>NEG Urine Strep Ag 11/3 >> neg  Urine Legionella Ag 11/3 >>neg Trach Asp Ctx 11/3>>few candida   Blood 11/02 >>NTD Urine Ctx 11/02>>neg  ENDOCRINE A:  H/O DM Hypoglycemia - resolved   P:   Accuchecks q4hr Low dose SSI for coverage Cont TF  FAMILY UPDATE: - Family:  Met with entire family this morning, discussed plan of care, informed that there will be significant brain damage.  After discussion, decision was made to make patient LCB with no CPR/cardioversion and once neurology completes prognostication will discuss trach/peg.  - Inter-disciplinary family meet or Palliative Care meeting due by:  07/23/15.  Neuro consult called, head CT ordered, begin weaning trials but no extubation given mental status.  Will await prognostication from neuro prior to speaking with family.  The patient is critically ill with multiple organ systems failure and requires high complexity decision making for assessment and support, frequent evaluation and titration of therapies, application of advanced  monitoring technologies and extensive interpretation of multiple databases.   Critical Care Time devoted to patient care services described in this note is  35  Minutes. This time reflects time of care of this signee Dr WJennet Maduro This critical care time does not reflect procedure time, or teaching time or supervisory time of PA/NP/Med student/Med Resident etc but could involve care discussion time.  WRush Farmer M.D. LNorth [facility] Surgical CenterPulmonary/Critical Care Medicine. Pager: [phone] After hours pager: [phone]

## 2015-07-23 NOTE — Progress Notes (Signed)
NEURO HOSPITALIST PROGRESS NOTE   SUBJECTIVE:                                                                                                                        Having frequent episodic twitching right neck-upper chest and right hand. 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 EEG pending.  OBJECTIVE:                                                                                                                           Vital signs in last 24 hours: Temp:  [99.8 F (37.7 C)-102.9 F (39.4 C)] 102.9 F (39.4 C) (11/08 0745) Pulse Rate:  [92-115] 105 (11/08 0837) Resp:  [16-30] 17 (11/08 0700) BP: (145-179)/(63-97) 145/63 mmHg (11/08 0837) SpO2:  [94 %-100 %] 98 % (11/08 0839) Arterial Line BP: (119-191)/(58-89) 155/67 mmHg (11/08 0700) FiO2 (%):  [40 %] 40 % (11/08 0840) Weight:  [83.9 kg (184 lb 15.5 oz)] 83.9 kg (184 lb 15.5 oz) (11/08 0200)  Intake/Output from previous day: 11/07 0701 - 11/08 0700 In: 2265 [I.V.:330; NG/GT:1525; IV Piggyback:410] Out: 1290 [Urine:1290] Intake/Output this shift: Total I/O In: -  Out: 95 [Urine:95] Nutritional status:    Past Medical History  Diagnosis Date  . Leukocytoclastic vasculitis ([place])   . Diabetes mellitus   . Breast cancer ([place])     left 2007/right 1997  . Left-sided Breast cancer 07/22/2011  . Cardiomyopathy 07/22/2011  . COPD (chronic obstructive pulmonary disease) ([place]) 07/22/2011  . Hypertension   . Hyperlipidemia   . 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  Stage II (T2 N0).  ER 73%, PR 90%, Her2 positive, Ki-67 19%.  2.1 cm in size.  Agreed to only have radiation, Herceptin, and Aromasin.  Right sided breast cancer: Stage II  (T2 N0 M0).  2.5 cm in size.  Surgery on 04/28/1996 with axillary dissection on 05/10/1996.  11 negative nodes.  Treated with AC x 4 cycles for ER negative, PR positive at only 20%.    Marland Kitchen  Breast cancer, right breast ([place]) 12/16/2013  . Stroke [facility])    Physical Examination:  HEENT- Normocephalic, no lesions, without obvious abnormality. Normal external eye and conjunctiva. Normal TM's bilaterally. Normal auditory canals and external ears. Normal external nose, mucus membranes and septum. Normal pharynx. Cardiovascular- S1, S2 normal, pulses palpable throughout  Lungs- chest clear, no wheezing, rales, normal symmetric air entry Abdomen- normal findings: bowel sounds normal Extremities- no edema Lymph-no adenopathy palpable Musculoskeletal-no joint tenderness, deformity or swelling Skin-warm and dry, no hyperpigmentation, vitiligo, or suspicious lesions  Neurologic Exam:  Mental Status: Patient does not respond to verbal stimuli. Does not respond to deep sternal rub. Does not follow commands. No verbalizations noted.  Cranial Nerves: II: patient does not respond confrontation bilaterally, pupils right 3 mm and brisk, left 3 mm and sluggish but reactive bilaterally III,IV,VI: doll's response present bilaterally.  V,VII: corneal reflex absent on the right and present on the left VIII: patient does not respond to verbal stimuli IX,X: gag reflex absent, but cough intact XI: trapezius strength unable to test bilaterally XII: tongue strength unable to test Motor: Extremities flaccid throughout. No spontaneous movement noted. No purposeful movements noted. Sensory: Does respond to noxious stimuli in legs only. Deep Tendon Reflexes:  Present in throughout UE 2+ and KJ  Plantars: equivocal bilaterally Cerebellar: Unable to perform  Lab Results: Lab Results  Component Value Date/Time    CHOL 184 09/28/2014 05:47 AM   Lipid Panel No results for input(s): CHOL, TRIG, HDL, CHOLHDL, VLDL, LDLCALC in the last 72 hours.  Studies/Results: Mr Brain Wo Contrast  07/22/2015  CLINICAL DATA:  Dyspnea and cardiac arrest, status post hypothermic protocol. Off sedation for 2 days, altered mental status. History of vasculitis, diabetes, breast cancer, hypertension, hyperlipidemia and stroke. EXAM: MRI HEAD WITHOUT CONTRAST TECHNIQUE: Multiplanar, multiecho pulse sequences of the brain and surrounding structures were obtained without intravenous contrast. COMPARISON:  CT head July 17, 2015 and MRI of the brain September 28, 2014 FINDINGS: Faint symmetric reduced diffusion within the bilateral occipital lobes, frontoparietal lobes with corresponding low ADC values. Faint reduced diffusion LEFT posterior temporal lobe. Very faint corresponding FLAIR T2 hyperintense signal. Generally bright basal ganglia and thalamus. Focal RIGHT medullary encephalomalacia corresponding to old infarct. No susceptibility artifact to suggest hemorrhage. A few scattered subcentimeter supratentorial white matter T2 hyperintensities most compatible with chronic small vessel ischemic disease, similar. Ventricles and sulci are normal for patient's age. No midline shift, mass effect or mass lesions. No abnormal extra-axial fluid collections. Normal major intracranial vascular flow voids seen at the skull base. Moderate paranasal sinus mucosal thickening, layering secretions in the nasopharynx with life-support lines in place. Moderate bilateral mastoid effusions. No abnormal sellar expansion. No cerebellar tonsillar ectopia. No abnormal calvarial bone marrow signal. 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 Mild white matter changes compatible  chronic small vessel ischemic disease. Electronically Signed   By: Elon Alas M.D.   On: 07/22/2015 23:27   Dg Chest Port 1 View  07/23/2015  CLINICAL DATA:  Anoxia. EXAM: PORTABLE CHEST 1 VIEW COMPARISON:  07/20/2015. FINDINGS: Endotracheal tube, right IJ line, feeding tube in stable position. Mediastinum hilar structures are unremarkable. Stable cardiomegaly. Interval partial clearing of bilateral pulmonary edema and/or infiltrates. No prominent pleural effusion or pneumothorax. IMPRESSION: 1. Lines and tubes in stable position. 2. Cardiomegaly with interim partial clearing of bilateral  pulmonary edema and or pulmonary infiltrates. Electronically Signed   By: Marcello Moores  Register   On: 07/23/2015 07:22    MEDICATIONS                                                                                                                        Scheduled: . antiseptic oral rinse  7 mL Mouth Rinse 10 times per day  . aspirin  81 mg Per Tube Daily  . azithromycin  500 mg Intravenous QHS  . cefTRIAXone (ROCEPHIN)  IV  1 g Intravenous QHS  . chlorhexidine gluconate  15 mL Mouth Rinse BID  . heparin  5,000 Units Subcutaneous 3 times per day  . insulin aspart  0-9 Units Subcutaneous 6 times per day  . ipratropium-albuterol  3 mL Nebulization Q6H  . levETIRAcetam  500 mg Intravenous Q12H  . metoprolol tartrate  25 mg Per Tube BID  . 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 Now with paroxysmal hyperkinetic movements concerning for myoclonic seizures. Get EEG and make further recommendations accordingly. Will follow up.  Dorian Pod, MD [facility] [phone]  07/23/2015, 9:43 AM

## 2015-07-23 NOTE — Progress Notes (Signed)
[facility], Magda Paganini, called for an update. Update given. Will call with any changes to patient's condition.

## 2015-07-23 NOTE — Progress Notes (Signed)
Routine EEG ordered, changed to LTM per Dr Armida Sans.

## 2015-07-23 NOTE — Progress Notes (Signed)
Called E-link about pt. Twitching at 1:30, MD camera in and ordered 4 mg of Versed.  If pt. Twitches continues then Keppra will be ordered.

## 2015-07-24 ENCOUNTER — Inpatient Hospital Stay (HOSPITAL_COMMUNITY): Payer: Commercial Managed Care - HMO

## 2015-07-24 DIAGNOSIS — G931 Anoxic brain damage, not elsewhere classified: Secondary | ICD-10-CM

## 2015-07-24 LAB — BLOOD GAS, ARTERIAL
ACID-BASE EXCESS: 6 mmol/L — AB (ref 0.0–2.0)
Bicarbonate: 29.3 mEq/L — ABNORMAL HIGH (ref 20.0–24.0)
FIO2: 0.4
MECHVT: 510 mL
O2 Saturation: 91 %
PATIENT TEMPERATURE: 100.3
PEEP/CPAP: 5 cmH2O
PH ART: 7.489 — AB (ref 7.350–7.450)
PO2 ART: 64.4 mmHg — AB (ref 80.0–100.0)
RATE: 16 resp/min
TCO2: 30.5 mmol/L (ref 0–100)
pCO2 arterial: 39.4 mmHg (ref 35.0–45.0)

## 2015-07-24 LAB — CBC
HCT: 42 % (ref 36.0–46.0)
HEMOGLOBIN: 13 g/dL (ref 12.0–15.0)
MCH: 25.9 pg — ABNORMAL LOW (ref 26.0–34.0)
MCHC: 31 g/dL (ref 30.0–36.0)
MCV: 83.7 fL (ref 78.0–100.0)
Platelets: 253 10*3/uL (ref 150–400)
RBC: 5.02 MIL/uL (ref 3.87–5.11)
RDW: 17.9 % — ABNORMAL HIGH (ref 11.5–15.5)
WBC: 18.2 10*3/uL — ABNORMAL HIGH (ref 4.0–10.5)

## 2015-07-24 LAB — BASIC METABOLIC PANEL
Anion gap: 9 (ref 5–15)
BUN: 17 mg/dL (ref 6–20)
CHLORIDE: 96 mmol/L — AB (ref 101–111)
CO2: 30 mmol/L (ref 22–32)
CREATININE: 0.59 mg/dL (ref 0.44–1.00)
Calcium: 9 mg/dL (ref 8.9–10.3)
GFR calc non Af Amer: >60 mL/min (ref >60)
Glucose, Bld: 169 mg/dL — ABNORMAL HIGH (ref 65–99)
POTASSIUM: 4.5 mmol/L (ref 3.5–5.1)
SODIUM: 135 mmol/L (ref 135–145)

## 2015-07-24 LAB — GLUCOSE, CAPILLARY
GLUCOSE-CAPILLARY: 171 mg/dL — AB (ref 65–99)
GLUCOSE-CAPILLARY: 174 mg/dL — AB (ref 65–99)
GLUCOSE-CAPILLARY: 193 mg/dL — AB (ref 65–99)
GLUCOSE-CAPILLARY: 184 mg/dL — AB (ref 65–99)
Glucose-Capillary: 224 mg/dL — ABNORMAL HIGH (ref 65–99)
Glucose-Capillary: 199 mg/dL — ABNORMAL HIGH (ref 65–99)
Glucose-Capillary: 205 mg/dL — ABNORMAL HIGH (ref 65–99)

## 2015-07-24 LAB — PHOSPHORUS: PHOSPHORUS: 3.7 mg/dL (ref 2.5–4.6)

## 2015-07-24 LAB — MAGNESIUM: MAGNESIUM: 1.7 mg/dL (ref 1.7–2.4)

## 2015-07-24 MED ORDER — SODIUM CHLORIDE 0.9 % IV SOLN
100.0000 mg | Freq: Two times a day (BID) | INTRAVENOUS | Status: DC
  Administered 2015-07-24 – 2015-07-25 (×4): 100 mg via INTRAVENOUS
  Filled 2015-07-24 (×7): qty 10

## 2015-07-24 MED ORDER — PANTOPRAZOLE SODIUM 40 MG PO PACK
40.0000 mg | PACK | Freq: Every day | ORAL | Status: DC
Start: 2015-07-24 — End: 2015-07-26
  Administered 2015-07-24 – 2015-07-25 (×2): 40 mg
  Filled 2015-07-24 (×2): qty 20

## 2015-07-24 MED ORDER — MAGNESIUM SULFATE 2 GM/50ML IV SOLN
2.0000 g | Freq: Once | INTRAVENOUS | Status: AC
  Administered 2015-07-24: 2 g via INTRAVENOUS
  Filled 2015-07-24: qty 50

## 2015-07-24 NOTE — Procedures (Signed)
ELECTROENCEPHALOGRAM REPORT   Patient: Madison Todd       Room #: 2G31  Age: 64 y.o.        Sex: female Referring Physician: Halford Chessman Report Date:  07/24/2015        Interpreting Physician: Alexis Goodell  History: Madison Todd is an 64 y.o. female with possible hypoxic injury and altered mental status  Medications:  Scheduled: . antiseptic oral rinse  7 mL Mouth Rinse 10 times per day  . aspirin  81 mg Per Tube Daily  . azithromycin  500 mg Intravenous QHS  . cefTRIAXone (ROCEPHIN)  IV  1 g Intravenous QHS  . chlorhexidine gluconate  15 mL Mouth Rinse BID  . heparin  5,000 Units Subcutaneous 3 times per day  . insulin aspart  0-9 Units Subcutaneous 6 times per day  . ipratropium-albuterol  3 mL Nebulization Q6H  . lacosamide (VIMPAT) IV  100 mg Intravenous Q12H  . levETIRAcetam  1,000 mg Intravenous Q12H  . magnesium sulfate 1 - 4 g bolus IVPB  2 g Intravenous Once  . metoprolol tartrate  25 mg Per Tube BID  . pantoprazole sodium  40 mg Per Tube QHS    Conditions of Recording:  This 2 hours of intensive EEG monitoring with simultaneous video monitoring was performed on 07/24/2015 from 0730 to 0933.  The study consists of a continuous 16-channel multi-montage digital video EEG recording with twenty-one electrodes placed according to the International 10-20 System. Additional leads included an EKG lead.  Patient intubated.  Description:  The background activity was severely attenuated and unable to be appreciated for the majority of the recording.  Superimposed eye movement could be noted intermittently in the frontal leads.  There were four occasions during the recording when was noted what appeared to be left sharp activity that would build up and become noticeable bilaterally.  There were no observers for the event monitor to be triggered and the video image was not close enough to appreciate clinical activity.  It should be noted though that these occasions were very  much like those seen earlier in the recording when video images were better appreciated and may very well again represented muscle artifact as opposed to electrographic epileptiform activity.    No activation procedures were performed due to mental status.     IMPRESSION: This is an abnormal electroencephalogram due to the severely attenuated and slow background consistent with hypoxic brain injury.  Sharp activity noted likely represent muscle artifact but due to video quality can not rule out possible seizure activity.     Alexis Goodell, MD [facility] [phone] 07/24/2015, 11:22 AM

## 2015-07-24 NOTE — Progress Notes (Signed)
LTM EEG complete. No skin breakdown, results pending

## 2015-07-24 NOTE — Progress Notes (Signed)
NEURO HOSPITALIST PROGRESS NOTE   SUBJECTIVE:                                                                                                                        Remains intubated on the vent, neurologically unchanged. Nurse reports that the left sided twitching has become more noticeable since 4 am today. c-EEG monitoring reveals no electrographic seizures but diffuse slowing. On keppra 1.5 mg BID.    OBJECTIVE:                                                                                                                           Vital signs in last 24 hours: Temp:  [99.6 F (37.6 C)-102.6 F (39.2 C)] 102.6 F (39.2 C) (11/09 0842) Pulse Rate:  [90-120] 120 (11/09 0600) Resp:  [12-26] 20 (11/09 0600) BP: (111-168)/(59-72) 168/72 mmHg (11/09 0337) SpO2:  [93 %-97 %] 97 % (11/09 0740) Arterial Line BP: (113-185)/(54-78) 151/68 mmHg (11/09 0600) FiO2 (%):  [40 %] 40 % (11/09 0740) Weight:  [83.9 kg (184 lb 15.5 oz)] 83.9 kg (184 lb 15.5 oz) (11/09 0300)  Intake/Output from previous day: 11/08 0701 - 11/09 0700 In: 2700 [I.V.:230; NG/GT:1905; IV Piggyback:565] Out: 1055 [Urine:1055] Intake/Output this shift:   Nutritional status:    Past Medical History  Diagnosis Date  . Leukocytoclastic vasculitis ([place])   . Diabetes mellitus   . Breast cancer ([place])     left 2007/right 1997  . Left-sided Breast cancer 07/22/2011  . Cardiomyopathy 07/22/2011  . COPD (chronic obstructive pulmonary disease) ([place]) 07/22/2011  . Hypertension   . Hyperlipidemia   . 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  Stage II (T2 N0).  ER 73%, PR 90%, Her2 positive, Ki-67 19%.  2.1 cm in size.  Agreed to only have radiation, Herceptin, and Aromasin.  Right sided breast cancer: Stage II (T2 N0 M0).  2.5 cm in size.  Surgery on 04/28/1996 with axillary dissection on 05/10/1996.  11 negative nodes.  Treated  with AC x 4 cycles for ER negative, PR positive at only 20%.    . Breast cancer, right breast ([place]) 12/16/2013  . Stroke [facility])  Physical Examination:  HEENT- Normocephalic, no lesions, without obvious abnormality. Normal external eye and conjunctiva. Normal TM's bilaterally. Normal auditory canals and external ears. Normal external nose, mucus membranes and septum. Normal pharynx. Cardiovascular- S1, S2 normal, pulses palpable throughout  Lungs- chest clear, no wheezing, rales, normal symmetric air entry Abdomen- normal findings: bowel sounds normal Extremities- no edema Lymph-no adenopathy palpable Musculoskeletal-no joint tenderness, deformity or swelling Skin-warm and dry, no hyperpigmentation, vitiligo, or suspicious lesions  Neurologic Exam:  Mental Status: Patient does not respond to verbal stimuli. Does not respond to deep sternal rub. Does not follow commands. No verbalizations noted.  Cranial Nerves: II: patient does not respond confrontation bilaterally, pupils right 3 mm and brisk, left 3 mm and sluggish but reactive bilaterally III,IV,VI: doll's response present bilaterally.  V,VII: corneal reflex absent on the right and present on the left VIII: patient does not respond to verbal stimuli IX,X: gag reflex absent, but cough intact XI: trapezius strength unable to test bilaterally XII: tongue strength unable to test Motor: Extremities flaccid throughout. No spontaneous movement noted. No purposeful movements noted. Sensory: Does respond to noxious stimuli in legs only. Deep Tendon Reflexes:  Present in throughout UE 2+ and KJ  Plantars: equivocal bilaterally Cerebellar: Unable to perform  Lab Results: Lab Results  Component Value Date/Time   CHOL 184 09/28/2014 05:47 AM   Lipid Panel No results for input(s): CHOL, TRIG, HDL, CHOLHDL, VLDL, LDLCALC in  the last 72 hours.  Studies/Results: Mr Brain Wo Contrast  07/22/2015  CLINICAL DATA:  Dyspnea and cardiac arrest, status post hypothermic protocol. Off sedation for 2 days, altered mental status. History of vasculitis, diabetes, breast cancer, hypertension, hyperlipidemia and stroke. EXAM: MRI HEAD WITHOUT CONTRAST TECHNIQUE: Multiplanar, multiecho pulse sequences of the brain and surrounding structures were obtained without intravenous contrast. COMPARISON:  CT head July 17, 2015 and MRI of the brain September 28, 2014 FINDINGS: Faint symmetric reduced diffusion within the bilateral occipital lobes, frontoparietal lobes with corresponding low ADC values. Faint reduced diffusion LEFT posterior temporal lobe. Very faint corresponding FLAIR T2 hyperintense signal. Generally bright basal ganglia and thalamus. Focal RIGHT medullary encephalomalacia corresponding to old infarct. No susceptibility artifact to suggest hemorrhage. A few scattered subcentimeter supratentorial white matter T2 hyperintensities most compatible with chronic small vessel ischemic disease, similar. Ventricles and sulci are normal for patient's age. No midline shift, mass effect or mass lesions. No abnormal extra-axial fluid collections. Normal major intracranial vascular flow voids seen at the skull base. Moderate paranasal sinus mucosal thickening, layering secretions in the nasopharynx with life-support lines in place. Moderate bilateral mastoid effusions. No abnormal sellar expansion. No cerebellar tonsillar ectopia. No abnormal calvarial bone marrow signal. 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 Mild white matter changes compatible chronic small vessel ischemic disease. Electronically Signed   By: Elon Alas M.D.   On: 07/22/2015 23:27    Dg Chest Port 1 View  07/24/2015  CLINICAL DATA:  Fever 102 tonight History of ETT EXAM: PORTABLE CHEST 1 VIEW COMPARISON:  One day prior FINDINGS: Endotracheal tube terminates 4.9 cm cm above carina.Feeding tube extends beyond the inferior aspect of the film. Right internal jugular line terminates at the mid SVC. Right axillary surgical clips. Cardiomegaly accentuated by AP portable technique. Small left pleural effusion. No pneumothorax. Interstitial prominence and indistinctness, slightly accentuated by diminished lung volumes on the  current exam. Bibasilar airspace disease. IMPRESSION: No significant change since one day prior. Cardiomegaly with low lung volumes and interstitial prominence. Findings could relate to pulmonary venous congestion or viral/ atypical bacterial pneumonia. Patchy bibasilar airspace opacities could also represent infection or atelectasis. Electronically Signed   By: Abigail Miyamoto M.D.   On: 07/24/2015 07:18   Dg Chest Port 1 View  07/23/2015  CLINICAL DATA:  Anoxia. EXAM: PORTABLE CHEST 1 VIEW COMPARISON:  07/20/2015. FINDINGS: Endotracheal tube, right IJ line, feeding tube in stable position. Mediastinum hilar structures are unremarkable. Stable cardiomegaly. Interval partial clearing of bilateral pulmonary edema and/or infiltrates. No prominent pleural effusion or pneumothorax. IMPRESSION: 1. Lines and tubes in stable position. 2. Cardiomegaly with interim partial clearing of bilateral pulmonary edema and or pulmonary infiltrates. Electronically Signed   By: Marcello Moores  Register   On: 07/23/2015 07:22    MEDICATIONS                                                                                                                        Scheduled: . antiseptic oral rinse  7 mL Mouth Rinse 10 times per day  . aspirin  81 mg Per Tube Daily  . azithromycin  500 mg Intravenous QHS  . cefTRIAXone (ROCEPHIN)  IV  1 g Intravenous QHS  . chlorhexidine gluconate  15 mL Mouth Rinse BID   . heparin  5,000 Units Subcutaneous 3 times per day  . insulin aspart  0-9 Units Subcutaneous 6 times per day  . ipratropium-albuterol  3 mL Nebulization Q6H  . lacosamide (VIMPAT) IV  100 mg Intravenous Q12H  . levETIRAcetam  1,000 mg Intravenous Q12H  . metoprolol tartrate  25 mg Per Tube BID  . 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 Clinical events now more suggestive of myoclonic seizures but without EEG correlate on c-EEG monitoring. Add vimpat to keppra. D/C c-EEG monitoring. Will follow up.  Dorian Pod, MD [facility] [phone]  07/24/2015, 8:59 AM

## 2015-07-24 NOTE — Procedures (Signed)
Electroencephalogram report- LTM  Ordering Physician : Dr. Aram Beecham    Beginning date or time: 07/23/2015 9:41AM Ending date or time: 07/24/2015 7:30AM  Day of study: day 1  Medications include: Per EMR  HISTORY: This 24 hours of intensive EEG monitoring with simultaneous video monitoring was performed for this patient with possible hypoxic injury and altered mental status. This EEG was requested to rule out subclinical electrographic seizures.  TECHNICAL DESCRIPTION:  The study consists of a continuous 16-channel multi-montage digital video EEG recording with twenty-one electrodes placed according to the International 10-20 System. Additional leads included an EKG lead. Activation procedures were not done due to mental status.  REPORT: The background activity in this tracing consisted of low voltage polymorphic delta with no clear posterior dominant rhythm. There were eye blinks during the recording with no clear change in the background but formal reactivity testing was not done. Intermittently muscle artifact was seen in the left hemispheric leads, and video showed rhythmic twitching of the left arm and face. No electrographic epileptiform activity was seen during these episodes. No focal slowing or epileptiform activity was identified.   IMPRESSION: This is an abnormal EEG due to: Severe diffuse slowing.  CLINICAL CORRELATION: This EEG is consistent with severe diffuse non-specific cerebral dysfunction. 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 No electrographic seizures were seen. Clinical correlation is required.

## 2015-07-24 NOTE — Progress Notes (Signed)
PULMONARY / CRITICAL CARE MEDICINE   Name: Madison Todd MRN: [id number] DOB: [birth year]    ADMISSION DATE:  07/29/2015 CONSULTATION DATE:  07/19/2015  REFERRING MD :  Forestine Na EDP  CHIEF COMPLAINT:  Cardiac arrest  INITIAL PRESENTATION: 64 year old female with sudden onset shortness of breath, was severely dyspneic upon EMS arrival and suffered respiratory, then very brief PEA cardiac arrest in route to emergency department. Total downtime estimated at 1 minute. She was intubated, and had little neurologic activity post arrest. Transferred to [facility] for ICU admission and possible therapeutic hypothermia.  STUDIES:  CXR 11/2 - CHF, asymmetric edema R > L CT head 11/2 - no acute findings, remote Rt medullary infarct CTA chest 11/2 - tiny bilateral pleural effusions with lower lobe & left upper lobe atelectasis versus opacity. Mild interstitial edema. EEG 11/3  - abnormal with markedly suppressed diffuse cerebral activity with generalized slowing which may be related to sedation, encephalopathic state can not be ruled out, no seizures.  ECHO 11/3 - moderate LVH, LVEF 60-65%, no RWMA, grade 1 diastolic dysfunction, PA peak 45 mm hg EEG 11/5>>> generalized slowing c/w encephalopathy, no seizure.   SIGNIFICANT EVENTS: 11/02  Taken to [facility] after brief PEA cardiac arrest > transferred to [facility] for consideration of therapeutic hypothermia. 11/03  Desaturations, clenching ETT, increased O2 to 60%/PEEP 10 11/08  Tonic clonic activity with negative EEG. 11/09  Neuro  SUBJECTIVE:   Off sedation.  Seizure activity overnight and this AM remains as yesterday.  VITAL SIGNS: Temp:  [99.6 F (37.6 C)-102.6 F (39.2 C)] 102.6 F (39.2 C) (11/09 0842) Pulse Rate:  [90-120] 112 (11/09 0902) Resp:  [12-26] 16 (11/09 0740) BP: (111-168)/(59-72) 149/64 mmHg (11/09 0902) SpO2:  [93 %-97 %] 97 % (11/09 0740) Arterial Line BP: (113-165)/(54-73) 151/68 mmHg (11/09 0600) FiO2 (%):  [40  %] 40 % (11/09 0740) Weight:  [83.9 kg (184 lb 15.5 oz)] 83.9 kg (184 lb 15.5 oz) (11/09 0300)   HEMODYNAMICS: CVP:  [4 mmHg-5 mmHg] 4 mmHg   VENTILATOR SETTINGS: Vent Mode:  [-] PRVC FiO2 (%):  [40 %] 40 % Set Rate:  [16 bmp] 16 bmp Vt Set:  [510 mL] 510 mL PEEP:  [5 cmH20] 5 cmH20 Plateau Pressure:  [18 cmH20-25 cmH20] 18 cmH20   INTAKE / OUTPUT:  Intake/Output Summary (Last 24 hours) at 07/24/15 0951 Last data filed at 07/24/15 3664  Gross per 24 hour  Intake   2460 ml  Output    900 ml  Net   1560 ml   PHYSICAL EXAMINATION: General:  Adult female in NAD on vent, gag intact but no other reflexed noted. Integument:  Warm & dry. No rash on exposed skin.  R mastectomy noted.  HEENT:  No scleral injection or icterus. Sclera edema noted. ETT Cardiovascular:  Regular rate. Edema. No appreciable JVD.  Pulmonary:  resps even non labored on PS 10/5, few scattered rhonchi  Abdomen: Soft. Normal bowel sounds. Nondistended.  Neurological: Single dose of versed 2 mg given at 8 AM for agitation, gag intact but no other reflexes noted.  LABS:  CBC  Recent Labs Lab 07/22/15 0415 07/23/15 0445 07/24/15 0330  WBC 15.8* 17.5* 18.2*  HGB 13.1 13.0 13.0  HCT 42.6 41.8 42.0  PLT 277 262 253   Coag's  Recent Labs Lab 07/18/2015 2120 07/18/15 0440  APTT 31  --   INR 1.29 1.26    BMET  Recent Labs Lab 07/22/15 0415 07/23/15 0445 07/24/15  0330  NA 134* 135 135  K 3.5 4.5 4.5  CL 93* 97* 96*  CO2 32 30 30  BUN 12 13 17   CREATININE 0.47 0.47 0.59  GLUCOSE 188* 188* 169*   Electrolytes  Recent Labs Lab 07/21/15 0410 07/22/15 0415 07/23/15 0445 07/24/15 0330  CALCIUM 8.4*  8.4* 8.4* 8.7* 9.0  MG 1.6* 1.8 1.8 1.7  PHOS 2.3*  --  3.1 3.7   Sepsis Markers  Recent Labs Lab 08/12/2015 1826 07/18/15 0012 07/18/15 1300 07/19/15 0350 07/20/15 0426  LATICACIDVEN 8.65* 2.9*  --  1.6  --   PROCALCITON  --   --  13.11 8.19 3.11   ABG  Recent Labs Lab  07/18/15 1400 07/19/15 1200 07/24/15 0335  PHART 7.344* 7.394 7.489*  PCO2ART 48.4* 44.3 39.4  PO2ART 77.0* 74.7* 64.4*   Liver Enzymes  Recent Labs Lab 08/11/2015 1825  07/20/15 1437 07/21/15 0410 07/22/15 1130  AST 123*  --   --   --  49*  ALT 78*  --   --   --  40  ALKPHOS 107  --   --   --  63  BILITOT 1.9*  --   --   --  0.7  ALBUMIN 3.0*  < > 2.1* 2.0* 2.0*  < > = values in this interval not displayed. Cardiac Enzymes  Recent Labs Lab 07/18/2015 1825 08/05/2015 2120  TROPONINI 0.05* 0.11*   Glucose  Recent Labs Lab 07/23/15 1145 07/23/15 1531 07/23/15 1953 07/23/15 2352 07/24/15 0333 07/24/15 0732  GLUCAP 192* 201* 171* 224* 174* 199*   Imaging Dg Chest Port 1 View  07/24/2015  CLINICAL DATA:  Fever 102 tonight History of ETT EXAM: PORTABLE CHEST 1 VIEW COMPARISON:  One day prior FINDINGS: Endotracheal tube terminates 4.9 cm cm above carina.Feeding tube extends beyond the inferior aspect of the film. Right internal jugular line terminates at the mid SVC. Right axillary surgical clips. Cardiomegaly accentuated by [facility] portable technique. Small left pleural effusion. No pneumothorax. Interstitial prominence and indistinctness, slightly accentuated by diminished lung volumes on the current exam. Bibasilar airspace disease. IMPRESSION: No significant change since one day prior. Cardiomegaly with low lung volumes and interstitial prominence. Findings could relate to pulmonary venous congestion or viral/ atypical bacterial pneumonia. Patchy bibasilar airspace opacities could also represent infection or atelectasis. Electronically Signed   By: Abigail Miyamoto M.D.   On: 07/24/2015 07:18   ASSESSMENT / PLAN:  CARDIOVASCULAR CVL RIJ 11/2 (placed in ED) >> A:  PEA arrest - likely primary respiratory arrest r/t PNA +/- CHF Elevated Troponin I Diastolic Dysfunction - grade 1 noted on ECHO Acute on chronic CHF (Grade 1 DD on 09/2014 echo, LVEF 60%) H/O HTN & HLD ? Cor Pulmonale   P:  Cardiology following. ASA 81mg  daily. Continue lopressor, hold norvasc for now. Tele monitoring.  PULMONARY OETT 11/2 >>> A: Acute Hypoxic Respiratory Failure - Due to CAP +/- component of pulmonary edema CAP Respiratory Alkalosis - Likely due to overventilation.  H/O COPD  P:   Intermittent CXR See ID section Duoneb q6hr PRN albuterol PRN ABG  No weaning given neuro changes.  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 Neuro consult appreciated, spoke with Dr. Armida Sans, patient will have significant brain damage and has not chance at functional recovery. Continuous EEG stopped, please see neuro's notes for details. Keppra  increased. Vimpat ordered. PRN order of ativan. Hold outpt gabapentin. Normothermia protocol complete.  RENAL A:   Hyponatremia - Hypervolemic. Improving. Hypomagnesemia Hypokalemia - Resolved Lactic acidosis - Improving.  P:   Chem in am. Replace electrolytes as indicated. KVO IVF.  GASTROINTESTINAL A:   No acute issues.  P:   Protonix IV daily. Cont TF.  HEMATOLOGIC / ONCOLOGIC A:   Leukocytosis - Likely secondary to sepsis. VTE prophylaxis H/O Breast CA - s/p radiation  P:  Trend CBC. SCDs. SQ heparin.  INFECTIOUS A:   Sepsis - concern for CAP as source  CAP Febrile overnight  P:   PCT 3.11.  Azithromycin 11/3>> Rocephin 11/3>>  Respiratory Viral PCR 11/3>>NEG Urine Strep Ag 11/3 >> neg  Urine Legionella Ag 11/3 >>neg Trach Asp Ctx 11/3>>few candida   Blood 11/02 >>NTD Urine Ctx 11/02>>neg  ENDOCRINE A:  H/O DM Hypoglycemia - resolved   P:   Accuchecks q4hr Low dose SSI for coverage Cont TF  FAMILY UPDATE: - Family:  Awaiting one more son then will have a family meeting for goals of care.  - Inter-disciplinary family meet or Palliative Care meeting due by:  07/23/15.  The patient is  critically ill with multiple organ systems failure and requires high complexity decision making for assessment and support, frequent evaluation and titration of therapies, application of advanced monitoring technologies and extensive interpretation of multiple databases.   Critical Care Time devoted to patient care services described in this note is  45  Minutes. This time reflects time of care of this signee Dr Jennet Maduro. This critical care time does not reflect procedure time, or teaching time or supervisory time of PA/NP/Med student/Med Resident etc but could involve care discussion time.  Rush Farmer, M.D. [facility] Pulmonary/Critical Care Medicine. Pager: [phone]. After hours pager: [phone].

## 2015-07-24 NOTE — Progress Notes (Signed)
CCM made aware of 102.6 temp after Tylenol. No new orders were given at this time. Will continue to monitor.

## 2015-07-25 ENCOUNTER — Inpatient Hospital Stay (HOSPITAL_COMMUNITY): Payer: Commercial Managed Care - HMO

## 2015-07-25 DIAGNOSIS — J9601 Acute respiratory failure with hypoxia: Secondary | ICD-10-CM

## 2015-07-25 LAB — GLUCOSE, CAPILLARY
GLUCOSE-CAPILLARY: 235 mg/dL — AB (ref 65–99)
GLUCOSE-CAPILLARY: 219 mg/dL — AB (ref 65–99)
Glucose-Capillary: 170 mg/dL — ABNORMAL HIGH (ref 65–99)
Glucose-Capillary: 217 mg/dL — ABNORMAL HIGH (ref 65–99)
Glucose-Capillary: 232 mg/dL — ABNORMAL HIGH (ref 65–99)

## 2015-07-25 LAB — BLOOD GAS, ARTERIAL
ACID-BASE EXCESS: 5.3 mmol/L — AB (ref 0.0–2.0)
BICARBONATE: 28.8 meq/L — AB (ref 20.0–24.0)
Drawn by: 418751
FIO2: 0.4
LHR: 16 {breaths}/min
O2 SAT: 94.2 %
PATIENT TEMPERATURE: 101.6
PCO2 ART: 41.4 mmHg (ref 35.0–45.0)
PEEP/CPAP: 5 cmH2O
PO2 ART: 78.6 mmHg — AB (ref 80.0–100.0)
TCO2: 29.9 mmol/L (ref 0–100)
VT: 510 mL
pH, Arterial: 7.464 — ABNORMAL HIGH (ref 7.350–7.450)

## 2015-07-25 LAB — BASIC METABOLIC PANEL
ANION GAP: 7 (ref 5–15)
BUN: 17 mg/dL (ref 6–20)
CHLORIDE: 100 mmol/L — AB (ref 101–111)
CO2: 29 mmol/L (ref 22–32)
Calcium: 8.6 mg/dL — ABNORMAL LOW (ref 8.9–10.3)
Creatinine, Ser: 0.65 mg/dL (ref 0.44–1.00)
GFR calc Af Amer: >60 mL/min (ref >60)
GLUCOSE: 234 mg/dL — AB (ref 65–99)
POTASSIUM: 4.2 mmol/L (ref 3.5–5.1)
Sodium: 136 mmol/L (ref 135–145)

## 2015-07-25 LAB — CBC
HEMATOCRIT: 39.8 % (ref 36.0–46.0)
Hemoglobin: 12.1 g/dL (ref 12.0–15.0)
MCH: 25.7 pg — AB (ref 26.0–34.0)
MCHC: 30.4 g/dL (ref 30.0–36.0)
MCV: 84.7 fL (ref 78.0–100.0)
PLATELETS: 226 10*3/uL (ref 150–400)
RBC: 4.7 MIL/uL (ref 3.87–5.11)
RDW: 18.1 % — ABNORMAL HIGH (ref 11.5–15.5)
WBC: 18.4 10*3/uL — AB (ref 4.0–10.5)

## 2015-07-25 LAB — PHOSPHORUS: Phosphorus: 3.3 mg/dL (ref 2.5–4.6)

## 2015-07-25 LAB — MAGNESIUM: Magnesium: 1.9 mg/dL (ref 1.7–2.4)

## 2015-07-25 NOTE — Progress Notes (Signed)
PULMONARY / CRITICAL CARE MEDICINE   Name: Madison Todd MRN: [id number] DOB: 08/03/1951    ADMISSION DATE:  08/05/2015 CONSULTATION DATE:  08/11/2015  REFERRING MD :  Forestine Na EDP  CHIEF COMPLAINT:  Cardiac arrest  INITIAL PRESENTATION: 64 year old female with sudden onset shortness of breath, was severely dyspneic upon EMS arrival and suffered respiratory, then very brief PEA cardiac arrest in route to emergency department. Total downtime estimated at 1 minute. She was intubated, and had little neurologic activity post arrest. Transferred to [facility] for ICU admission and possible therapeutic hypothermia.  STUDIES:  CXR 11/2 - CHF, asymmetric edema R > L CT head 11/2 - no acute findings, remote Rt medullary infarct CTA chest 11/2 - tiny bilateral pleural effusions with lower lobe & left upper lobe atelectasis versus opacity. Mild interstitial edema. EEG 11/3  - abnormal with markedly suppressed diffuse cerebral activity with generalized slowing which may be related to sedation, encephalopathic state can not be ruled out, no seizures.  ECHO 11/3 - moderate LVH, LVEF 60-65%, no RWMA, grade 1 diastolic dysfunction, PA peak 45 mm hg EEG 11/5>>> generalized slowing c/w encephalopathy, no seizure.   SIGNIFICANT EVENTS: 11/02  Taken to [facility] after brief PEA cardiac arrest > transferred to [facility] for consideration of therapeutic hypothermia. 11/03  Desaturations, clenching ETT, increased O2 to 60%/PEEP 10 11/08  Tonic clonic activity with negative EEG. 11/09  Neuro  SUBJECTIVE:   Off sedation.  No events overnight.  VITAL SIGNS: Temp:  [99.8 F (37.7 C)-102.4 F (39.1 C)] 100.9 F (38.3 C) (11/10 0732) Pulse Rate:  [93-109] 93 (11/10 1100) Resp:  [11-31] 16 (11/10 1100) BP: (147-155)/(63-77) 147/69 mmHg (11/10 1001) SpO2:  [96 %-98 %] 97 % (11/10 1100) Arterial Line BP: (130-172)/(61-82) 130/61 mmHg (11/10 1100) FiO2 (%):  [40 %] 40 % (11/10 1000) Weight:  [84.5 kg  (186 lb 4.6 oz)] 84.5 kg (186 lb 4.6 oz) (11/10 0330)   HEMODYNAMICS:     VENTILATOR SETTINGS: Vent Mode:  [-] PRVC FiO2 (%):  [40 %] 40 % Set Rate:  [16 bmp] 16 bmp Vt Set:  [510 mL] 510 mL PEEP:  [5 cmH20] 5 cmH20 Plateau Pressure:  [19 cmH20-20 cmH20] 19 cmH20   INTAKE / OUTPUT:  Intake/Output Summary (Last 24 hours) at 07/25/15 1116 Last data filed at 07/25/15 1001  Gross per 24 hour  Intake   2500 ml  Output   1955 ml  Net    545 ml   PHYSICAL EXAMINATION: General:  Adult female in NAD on vent, gag intact but no other reflexed noted. Integument:  Warm & dry. No rash on exposed skin.  R mastectomy noted.  HEENT:  No scleral injection or icterus. Sclera edema noted. ETT Cardiovascular:  Regular rate. Edema. No appreciable JVD.  Pulmonary:  resps even non labored on PS 10/5, few scattered rhonchi  Abdomen: Soft. Normal bowel sounds. Nondistended.  Neurological: Single dose of versed 2 mg given at 8 AM for agitation, gag intact but no other reflexes noted.  LABS:  CBC  Recent Labs Lab 07/23/15 0445 07/24/15 0330 07/25/15 0400  WBC 17.5* 18.2* 18.4*  HGB 13.0 13.0 12.1  HCT 41.8 42.0 39.8  PLT 262 253 226   Coag's No results for input(s): APTT, INR in the last 168 hours.  BMET  Recent Labs Lab 07/23/15 0445 07/24/15 0330 07/25/15 0400  NA 135 135 136  K 4.5 4.5 4.2  CL 97* 96* 100*  CO2 30 30  29  BUN 13 17 17   CREATININE 0.47 0.59 0.65  GLUCOSE 188* 169* 234*   Electrolytes  Recent Labs Lab 07/23/15 0445 07/24/15 0330 07/25/15 0400  CALCIUM 8.7* 9.0 8.6*  MG 1.8 1.7 1.9  PHOS 3.1 3.7 3.3   Sepsis Markers  Recent Labs Lab 07/18/15 1300 07/19/15 0350 07/20/15 0426  LATICACIDVEN  --  1.6  --   PROCALCITON 13.11 8.19 3.11   ABG  Recent Labs Lab 07/19/15 1200 07/24/15 0335 07/25/15 0459  PHART 7.394 7.489* 7.464*  PCO2ART 44.3 39.4 41.4  PO2ART 74.7* 64.4* 78.6*   Liver Enzymes  Recent Labs Lab 07/20/15 1437 07/21/15 0410  07/22/15 1130  AST  --   --  49*  ALT  --   --  40  ALKPHOS  --   --  63  BILITOT  --   --  0.7  ALBUMIN 2.1* 2.0* 2.0*   Cardiac Enzymes No results for input(s): TROPONINI, PROBNP in the last 168 hours. Glucose  Recent Labs Lab 07/24/15 1238 07/24/15 1655 07/24/15 1950 07/24/15 2354 07/25/15 0342 07/25/15 0735  GLUCAP 193* 184* 205* 232* 170* 217*   Imaging Dg Chest Port 1 View  07/25/2015  CLINICAL DATA:  Endotracheal tube EXAM: PORTABLE CHEST 1 VIEW COMPARISON:  Yesterday FINDINGS: Endotracheal tube tip between the clavicular heads and carina. A feeding tube at least reaches stomach. Right IJ central line, tip near the upper SVC, previously at the carina level. Cardiomegaly. Wider appearance of the vascular pedicle is likely from rotation which has increased to the right. Diffuse interstitial coarsening with streaky basilar opacity. Probable small pleural effusions. No pneumothorax. IMPRESSION: 1. Higher right IJ central line but tip still in good position at the upper SVC. Please ensure the catheter is secure. 2. Unchanged interstitial coarsening, likely edema superimposed on the background emphysema. 3. Bibasilar atelectasis or pneumonia. Electronically Signed   By: Monte Fantasia M.D.   On: 07/25/2015 05:59   ASSESSMENT / PLAN:  CARDIOVASCULAR CVL RIJ 11/2 (placed in ED) >> A:  PEA arrest - likely primary respiratory arrest r/t PNA +/- CHF Elevated Troponin I Diastolic Dysfunction - grade 1 noted on ECHO Acute on chronic CHF (Grade 1 DD on 09/2014 echo, LVEF 60%) H/O HTN & HLD ? Cor Pulmonale  P:  Cardiology following. ASA 81mg  daily. Continue lopressor, hold norvasc for now. Tele monitoring. No CPR/cardioversion.  PULMONARY OETT 11/2 >>> A: Acute Hypoxic Respiratory Failure - Due to CAP +/- component of pulmonary edema CAP Respiratory Alkalosis - Likely due to overventilation.  H/O COPD  P:   Intermittent CXR See ID section Duoneb q6hr PRN  albuterol PRN ABG  No weaning given neuro changes. Terminal extubation in AM likely.  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 Neuro consult appreciated, spoke with Dr. Armida Sans, patient will have significant brain damage and has not chance at functional recovery. Continuous EEG stopped, please see neuro's notes for details. Keppra increased. Vimpat ordered. PRN order of ativan. Hold outpt gabapentin. Normothermia protocol complete.  RENAL A:   Hyponatremia - Hypervolemic. Improving. Hypomagnesemia Hypokalemia - Resolved Lactic acidosis - Improving.  P:   D/C AM BMET. Replace electrolytes as indicated. KVO IVF.  GASTROINTESTINAL A:   No acute issues.  P:   Protonix IV daily. Cont TF.  HEMATOLOGIC / ONCOLOGIC A:   Leukocytosis - Likely secondary to  sepsis. VTE prophylaxis H/O Breast CA - s/p radiation  P:  D/C CBC draws. SCDs. SQ heparin.  INFECTIOUS A:   Sepsis - concern for CAP as source  CAP Febrile overnight  P:   PCT 3.11.  Azithromycin 11/3>> Rocephin 11/3>>  Respiratory Viral PCR 11/3>>NEG Urine Strep Ag 11/3 >> neg  Urine Legionella Ag 11/3 >>neg Trach Asp Ctx 11/3>>few candida   Blood 11/02 >>NTD Urine Ctx 11/02>>neg  Will d/c antibiotics in AM if withdrawing.  ENDOCRINE A:  H/O DM Hypoglycemia - resolved   P:   Accuchecks q4hr Low dose SSI for coverage Cont TF  FAMILY UPDATE: - Family:  Spoke with grand daughter, likely withdrawal in AM.  - Inter-disciplinary family meet or Palliative Care meeting due by:  07/23/15.  The patient is critically ill with multiple organ systems failure and requires high complexity decision making for assessment and support, frequent evaluation and titration of therapies, application of advanced monitoring technologies and extensive interpretation of multiple databases.   Critical  Care Time devoted to patient care services described in this note is  45  Minutes. This time reflects time of care of this signee Dr Jennet Maduro. This critical care time does not reflect procedure time, or teaching time or supervisory time of PA/NP/Med student/Med Resident etc but could involve care discussion time.  Rush Farmer, M.D. [facility] Pulmonary/Critical Care Medicine. Pager: [phone]. After hours pager: [phone].

## 2015-07-25 NOTE — Progress Notes (Signed)
ELink MD notified of puffiness on right side of neck. Central Line is placed there. Granddaughter at bedside stated plan to withdraw care in AM. No new orders given. Will continue to monitor.

## 2015-07-25 NOTE — Care Management Important Message (Signed)
Important Message  Patient Details  Name: Madison Todd MRN: [id number] Date of Birth: [birth year]   Medicare Important Message Given:  Yes    Josue Falconi P Dorita Rowlands 07/25/2015, 3:12 PM

## 2015-07-26 LAB — GLUCOSE, CAPILLARY
GLUCOSE-CAPILLARY: 212 mg/dL — AB (ref 65–99)
GLUCOSE-CAPILLARY: 224 mg/dL — AB (ref 65–99)
GLUCOSE-CAPILLARY: 226 mg/dL — AB (ref 65–99)
Glucose-Capillary: 218 mg/dL — ABNORMAL HIGH (ref 65–99)
Glucose-Capillary: 193 mg/dL — ABNORMAL HIGH (ref 65–99)

## 2015-07-26 LAB — MAGNESIUM: Magnesium: 1.9 mg/dL (ref 1.7–2.4)

## 2015-07-26 MED ORDER — MIDAZOLAM BOLUS VIA INFUSION
5.0000 mg | INTRAVENOUS | Status: DC | PRN
  Filled 2015-07-26: qty 20

## 2015-07-26 MED ORDER — SODIUM CHLORIDE 0.9 % IV SOLN
10.0000 mg/h | INTRAVENOUS | Status: DC
  Administered 2015-07-26: 2 mg/h via INTRAVENOUS
  Filled 2015-07-26: qty 10

## 2015-07-26 MED ORDER — DEXTROSE 5 % IV SOLN
10.0000 mg/h | INTRAVENOUS | Status: DC
  Administered 2015-07-26: 10 mg/h via INTRAVENOUS
  Filled 2015-07-26: qty 10

## 2015-07-26 MED ORDER — MORPHINE BOLUS VIA INFUSION
5.0000 mg | INTRAVENOUS | Status: DC | PRN
  Filled 2015-07-26: qty 20

## 2015-07-29 ENCOUNTER — Telehealth: Payer: Self-pay

## 2015-07-29 NOTE — Telephone Encounter (Signed)
On 07/29/2015 I received a death certificate from [facility]. The death certificate is for burial. The patient is a patient of Doctor Nelda Marseille. The death certificate will be taken to the [facility] tomorrow pm for signature.  On August 24, 2015 I received the death certificate back from Doctor Nelda Marseille. I got the death certificate ready and called the funeral home to let them know the death certificate is ready for pickup.

## 2015-08-15 NOTE — Progress Notes (Signed)
CDS [facility]) contacted regarding patient time of death. Pt not a candidate for organ donation.

## 2015-08-15 NOTE — Progress Notes (Signed)
Patient was extubated at 35 for comfort care. Patient expired at 62 with family at beside. Death verified by Adin Hector, RN and Heide Guile, RN. No heart sounds auscultated.  225mg  Morphine and 30mg  Versed wasted in sink. Witnessed by Heide Guile, RN.

## 2015-08-15 NOTE — Progress Notes (Signed)
Nutrition Brief Note  Chart reviewed. Pt now transitioning to comfort care.  No further nutrition interventions warranted at this time.  Please re-consult as needed.   Eldana Isip RD, LDN, CNSC [phone] Pager [phone] After Hours Pager    

## 2015-08-15 NOTE — Progress Notes (Signed)
PULMONARY / CRITICAL CARE MEDICINE   Name: Madison Todd MRN: [id number] DOB: Oct 31, 1950    ADMISSION DATE:  08/08/2015 CONSULTATION DATE:  07/21/2015  REFERRING MD :  Forestine Na EDP  CHIEF COMPLAINT:  Cardiac arrest  INITIAL PRESENTATION: 64 year old female with sudden onset shortness of breath, was severely dyspneic upon EMS arrival and suffered respiratory, then very brief PEA cardiac arrest in route to emergency department. Total downtime estimated at 1 minute. She was intubated, and had little neurologic activity post arrest. Transferred to [facility] for ICU admission and possible therapeutic hypothermia.  STUDIES:  CXR 11/2 - CHF, asymmetric edema R > L CT head 11/2 - no acute findings, remote Rt medullary infarct CTA chest 11/2 - tiny bilateral pleural effusions with lower lobe & left upper lobe atelectasis versus opacity. Mild interstitial edema. EEG 11/3  - abnormal with markedly suppressed diffuse cerebral activity with generalized slowing which may be related to sedation, encephalopathic state can not be ruled out, no seizures.  ECHO 11/3 - moderate LVH, LVEF 60-65%, no RWMA, grade 1 diastolic dysfunction, PA peak 45 mm hg EEG 11/5>>> generalized slowing c/w encephalopathy, no seizure.   SIGNIFICANT EVENTS: 11/02  [facility]aken to [facility] after brief PEA cardiac arrest > transferred to [facility] for consideration of therapeutic hypothermia. 11/03  Desaturations, clenching ETT, increased O2 to 60%/PEEP 10 11/08  Tonic clonic activity with negative EEG. 11/09  Neuro  SUBJECTIVE:   Off sedation.  No events overnight.  Remains unresponsive.  VITAL SIGNS: Temp:  [97.9 F (36.6 C)-101.3 F (38.5 C)] 99 F (37.2 C) (11/11 0745) Pulse Rate:  [85-118] 97 (11/11 0815) Resp:  [16-31] 19 (11/11 0815) BP: (140-148)/(67-76) 148/71 mmHg (11/11 0815) SpO2:  [95 %-100 %] 96 % (11/11 0815) Arterial Line BP: (130-186)/(61-94) 149/75 mmHg (11/11 0600) FiO2 (%):  [40 %] 40 % (11/11  0815) Weight:  [81.6 kg (179 lb 14.3 oz)] 81.6 kg (179 lb 14.3 oz) (11/11 0315)   HEMODYNAMICS:     VENTILATOR SETTINGS: Vent Mode:  [-] PRVC FiO2 (%):  [40 %] 40 % Set Rate:  [16 bmp] 16 bmp Vt Set:  [501 mL-510 mL] 510 mL PEEP:  [5 cmH20] 5 cmH20 Plateau Pressure:  [19 cmH20-26 cmH20] 23 cmH20   INTAKE / OUTPUT:  Intake/Output Summary (Last 24 hours) at 2015/08/21 0954 Last data filed at Aug 21, 2015 0600  Gross per 24 hour  Intake   2165 ml  Output   1175 ml  Net    990 ml   PHYSICAL EXAMINATION: General:  Adult female in NAD on vent, gag intact but no other reflexed noted. Integument:  Warm & dry. No rash on exposed skin.  R mastectomy noted.  HEENT:  No scleral injection or icterus. Sclera edema noted. ETT Cardiovascular:  Regular rate. Edema. No appreciable JVD.  Pulmonary:  resps even non labored on PS 10/5, few scattered rhonchi  Abdomen: Soft. Normal bowel sounds. Nondistended.  Neurological: Single dose of versed 2 mg given at 8 AM for agitation, gag intact but no other reflexes noted.  LABS:  CBC  Recent Labs Lab 07/23/15 0445 07/24/15 0330 07/25/15 0400  WBC 17.5* 18.2* 18.4*  HGB 13.0 13.0 12.1  HCT 41.8 42.0 39.8  PLT 262 253 226   Coag's No results for input(s): APTT, INR in the last 168 hours.  BMET  Recent Labs Lab 07/23/15 0445 07/24/15 0330 07/25/15 0400  NA 135 135 136  K 4.5 4.5 4.2  CL 97* 96* 100*  CO2 30 30 29   BUN 13 17 17   CREATININE 0.47 0.59 0.65  GLUCOSE 188* 169* 234*   Electrolytes  Recent Labs Lab 07/23/15 0445 07/24/15 0330 07/25/15 0400 01-Aug-2015 0400  CALCIUM 8.7* 9.0 8.6*  --   MG 1.8 1.7 1.9 1.9  PHOS 3.1 3.7 3.3  --    Sepsis Markers  Recent Labs Lab 07/20/15 0426  PROCALCITON 3.11   ABG  Recent Labs Lab 07/19/15 1200 07/24/15 0335 07/25/15 0459  PHART 7.394 7.489* 7.464*  PCO2ART 44.3 39.4 41.4  PO2ART 74.7* 64.4* 78.6*   Liver Enzymes  Recent Labs Lab 07/20/15 1437 07/21/15 0410  07/22/15 1130  AST  --   --  49*  ALT  --   --  40  ALKPHOS  --   --  63  BILITOT  --   --  0.7  ALBUMIN 2.1* 2.0* 2.0*   Cardiac Enzymes No results for input(s): TROPONINI, PROBNP in the last 168 hours. Glucose  Recent Labs Lab 07/25/15 1156 07/25/15 1725 07/25/15 1921 2015-08-01 0003 08-01-2015 0332 08-01-2015 0749  GLUCAP 235* 212* 219* 226* 224* 218*   Imaging No results found. ASSESSMENT / PLAN:  CARDIOVASCULAR CVL RIJ 11/2 (placed in ED) >> A:  PEA arrest - likely primary respiratory arrest r/t PNA +/- CHF Elevated Troponin I Diastolic Dysfunction - grade 1 noted on ECHO Acute on chronic CHF (Grade 1 DD on 09/2014 echo, LVEF 60%) H/O HTN & HLD ? Cor Pulmonale  P:  D/C tele. Proceed with comfort measures.  PULMONARY OETT 11/2 >>> A: Acute Hypoxic Respiratory Failure - Due to CAP +/- component of pulmonary edema CAP Respiratory Alkalosis - Likely due to overventilation.  H/O COPD  P:   Morphine and versed for comfort then extubate.  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 Morphine for comfort. Extubate.  RENAL A:   Hyponatremia - Hypervolemic. Improving. Hypomagnesemia Hypokalemia - Resolved Lactic acidosis - Improving.  P:   D/C blood draws. KVO IVF.  GASTROINTESTINAL A:   No acute issues.  P:   D/C protonix and TF.  HEMATOLOGIC / ONCOLOGIC A:   Leukocytosis - Likely secondary to sepsis. VTE prophylaxis H/O Breast CA - s/p radiation  P:  D/C CBC draws.  INFECTIOUS A:   Sepsis - concern for CAP as source  CAP Febrile overnight  P:   PCT 3.11.  Azithromycin 11/3>>11/10 Rocephin 11/3>>11/10  Respiratory Viral PCR 11/3>>NEG Urine Strep Ag 11/3 >> neg  Urine Legionella Ag 11/3 >>neg Trach Asp Ctx 11/3>>few candida   Blood 11/02 >>NTD Urine Ctx 11/02>>neg  Will d/c antibiotics.  ENDOCRINE A:  H/O DM Hypoglycemia - resolved   P:    D/C CBGs.  FAMILY UPDATE: - Family:  Spoke with two sons and sister, they are ready for withdrawal will start versed/fentanyl.  The patient is critically ill with multiple organ systems failure and requires high complexity decision making for assessment and support, frequent evaluation and titration of therapies, application of advanced monitoring technologies and extensive interpretation of multiple databases.   Critical Care Time devoted to patient care services described in this note is  45  Minutes. This time reflects time of care of this signee Dr Jennet Maduro. This critical care time does not reflect procedure time, or teaching time or supervisory time of PA/NP/Med student/Med Resident etc but could involve care discussion time.  Rush Farmer, M.D. [facility] Pulmonary/Critical  Care Medicine. Pager: [phone]. After hours pager: [phone].

## 2015-08-15 NOTE — Procedures (Signed)
Extubation Procedure Note  Patient Details:   Name: Madison Todd DOB: [birth year] MRN: [id number]   Airway Documentation:  Airway 7.5 mm (Active)  Secured at (cm) 20 cm 08-03-2015  8:15 AM  Measured From Lips 08/03/15  8:15 AM  [place] 08-03-15  8:15 AM  Secured By [employer] Aug 03, 2015  8:15 AM  Tube Holder Repositioned Yes 2015-08-03  8:15 AM  Cuff Pressure (cm H2O) 28 cm H2O 07/25/2015  4:05 PM  Site Condition Drainage (Comment) 08-03-2015  8:15 AM    Evaluation  O2 sats: currently acceptable Complications: No apparent complications Patient did tolerate procedure well. Bilateral Breath Sounds: Clear, Diminished Suctioning: Airway No   Terminal wean.  [place]ingo Amber Zela Sobieski 03-Aug-2015, 12:29 PM

## 2015-08-15 NOTE — Progress Notes (Signed)
   August 20, 2015 1040  Clinical Encounter Type  Visited With Patient and family together;Health care provider  Visit Type Initial;Patient actively dying  Referral From Family;Nurse  Spiritual Encounters  Spiritual Needs Prayer  Stress Factors  Family Stress Factors Health changes   Chaplain responded to a family/nurse request to visit with the patient and family to support them in what looks like a probable end of life. Family expressed some hope in a potential recovery, but are unsure about her strength and seem fine with either outcome. Chaplain offered support and prayer. More family may be on the way and they may want some prayer as well. Chaplain support available as needed.  Jeri Lager, Chaplain 08/20/2015 10:42 AM

## 2015-08-15 DEATH — deceased

## 2015-09-15 NOTE — Discharge Summary (Signed)
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  The patient is a 65 year old female with extensive past medical history, who suffered from respiratory arrest resulting in a cardiac arrest.  The patient was resuscitated, brought to [facility] where subsequently she was transferred to [facility].  The patient was cared for in [place] until 07-27-2015.  Throughout the hospitalization, multiple discussions with family regarding plan of care in place.  After Neurology finished their evaluation, concluded that the patient has significant anoxic brain injury.  After meeting with the family, informed that the patient would not want tracheostomy and feeding tube, would not want to be "arrestable," therefore at that point, recommendation was made to make the patient a full DNR and begin comfort measure, removal of the ET tube with the family's consent that was done, Versed and fentanyl were started, and the patient was extubated to expire shortly thereafter with the family at bedside.     Providence Lanius, MD     WJY/MEDQ  D:  08/19/2015  T:  08/20/2015  Job:  [id number]

## 2015-10-14 ENCOUNTER — Encounter: Payer: Self-pay | Admitting: *Deleted

## 2015-10-15 ENCOUNTER — Encounter: Payer: Self-pay | Admitting: *Deleted

## 2015-12-31 ENCOUNTER — Encounter (HOSPITAL_COMMUNITY): Payer: Self-pay

## 2016-02-06 ENCOUNTER — Ambulatory Visit (HOSPITAL_COMMUNITY): Payer: Commercial Managed Care - HMO | Admitting: Hematology & Oncology

## 2016-03-01 IMAGING — DX DG CHEST 2V
2 series · 2 of 2 positions shown · non-contrast
Comparison: PA and lateral chest x-ray June 17, 2007.

CLINICAL DATA: Cough and shortness of breath this week, history of
COPD and previous tobacco use. History of cardiomyopathy and left
breast malignancy.

EXAM:
CHEST  2 VIEW

[chest pa]
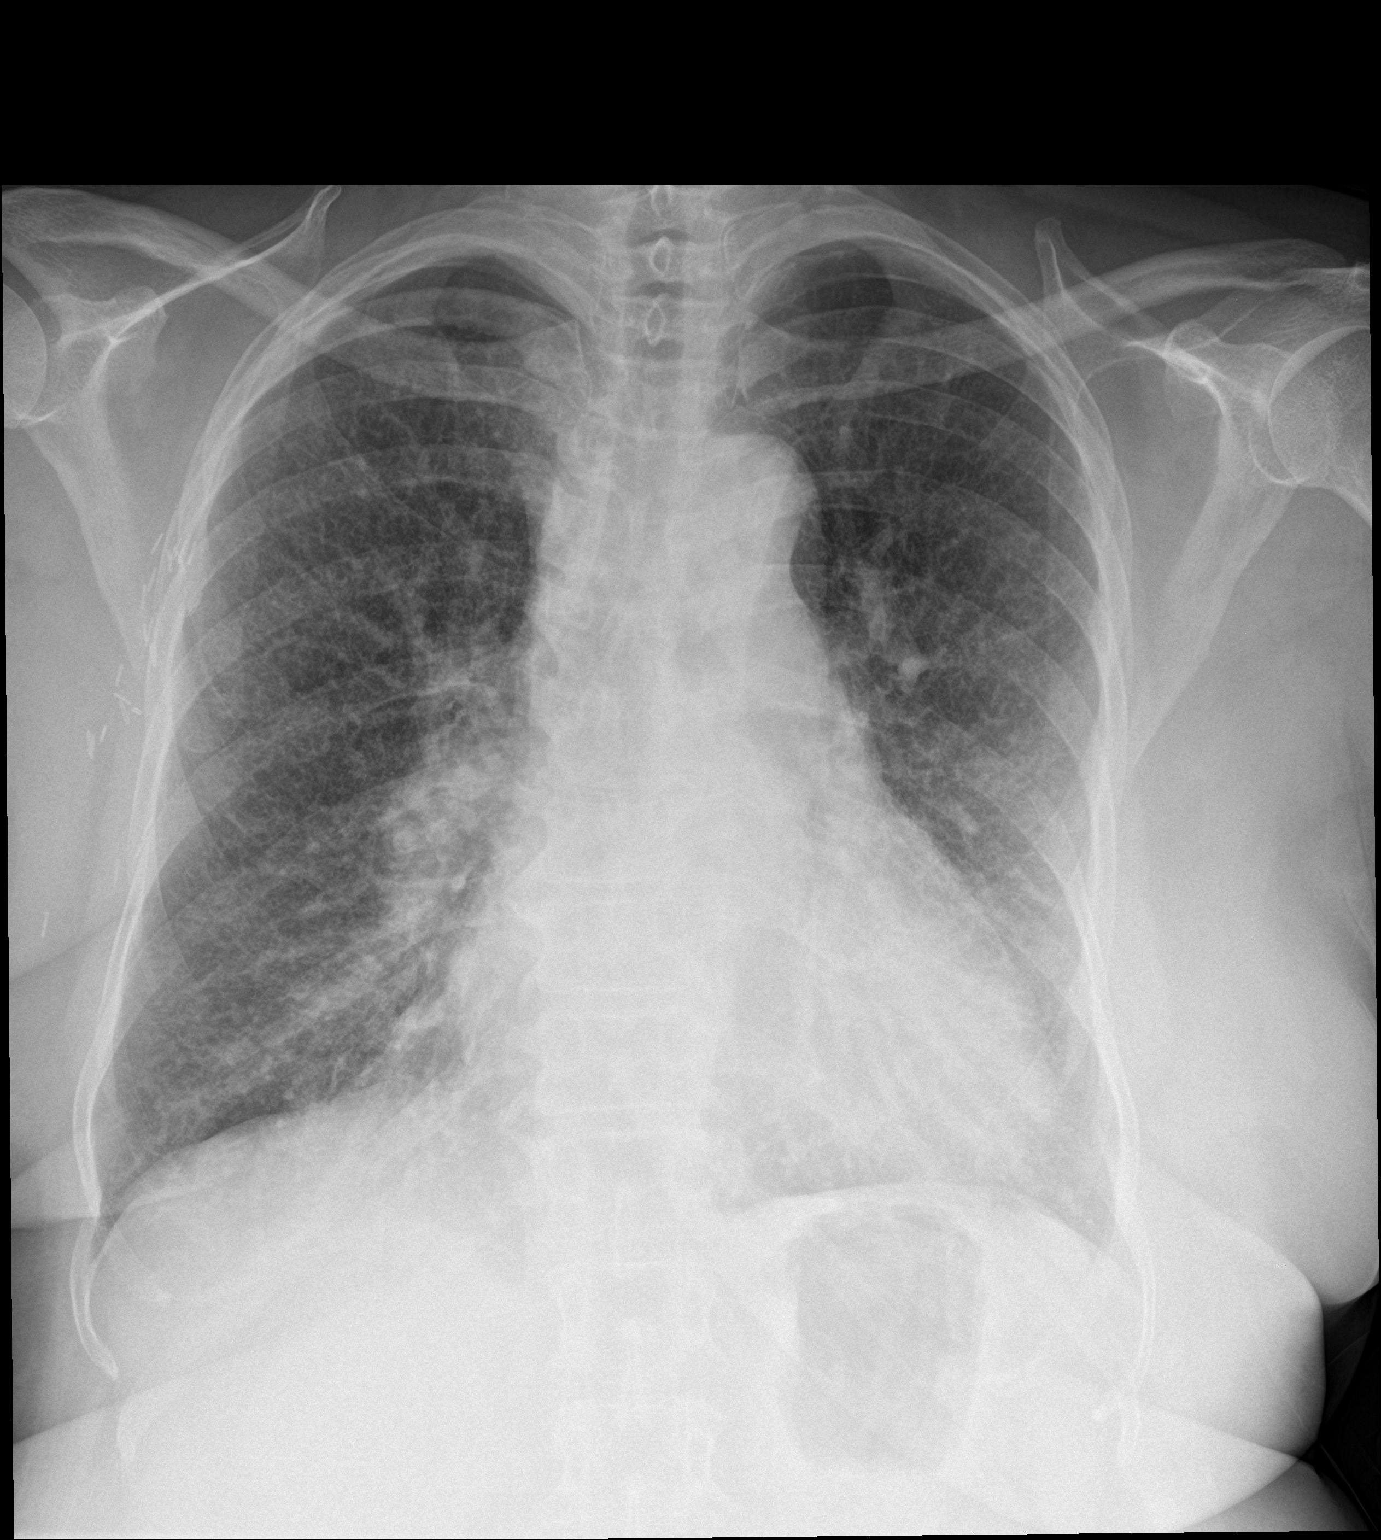

[chest lat]
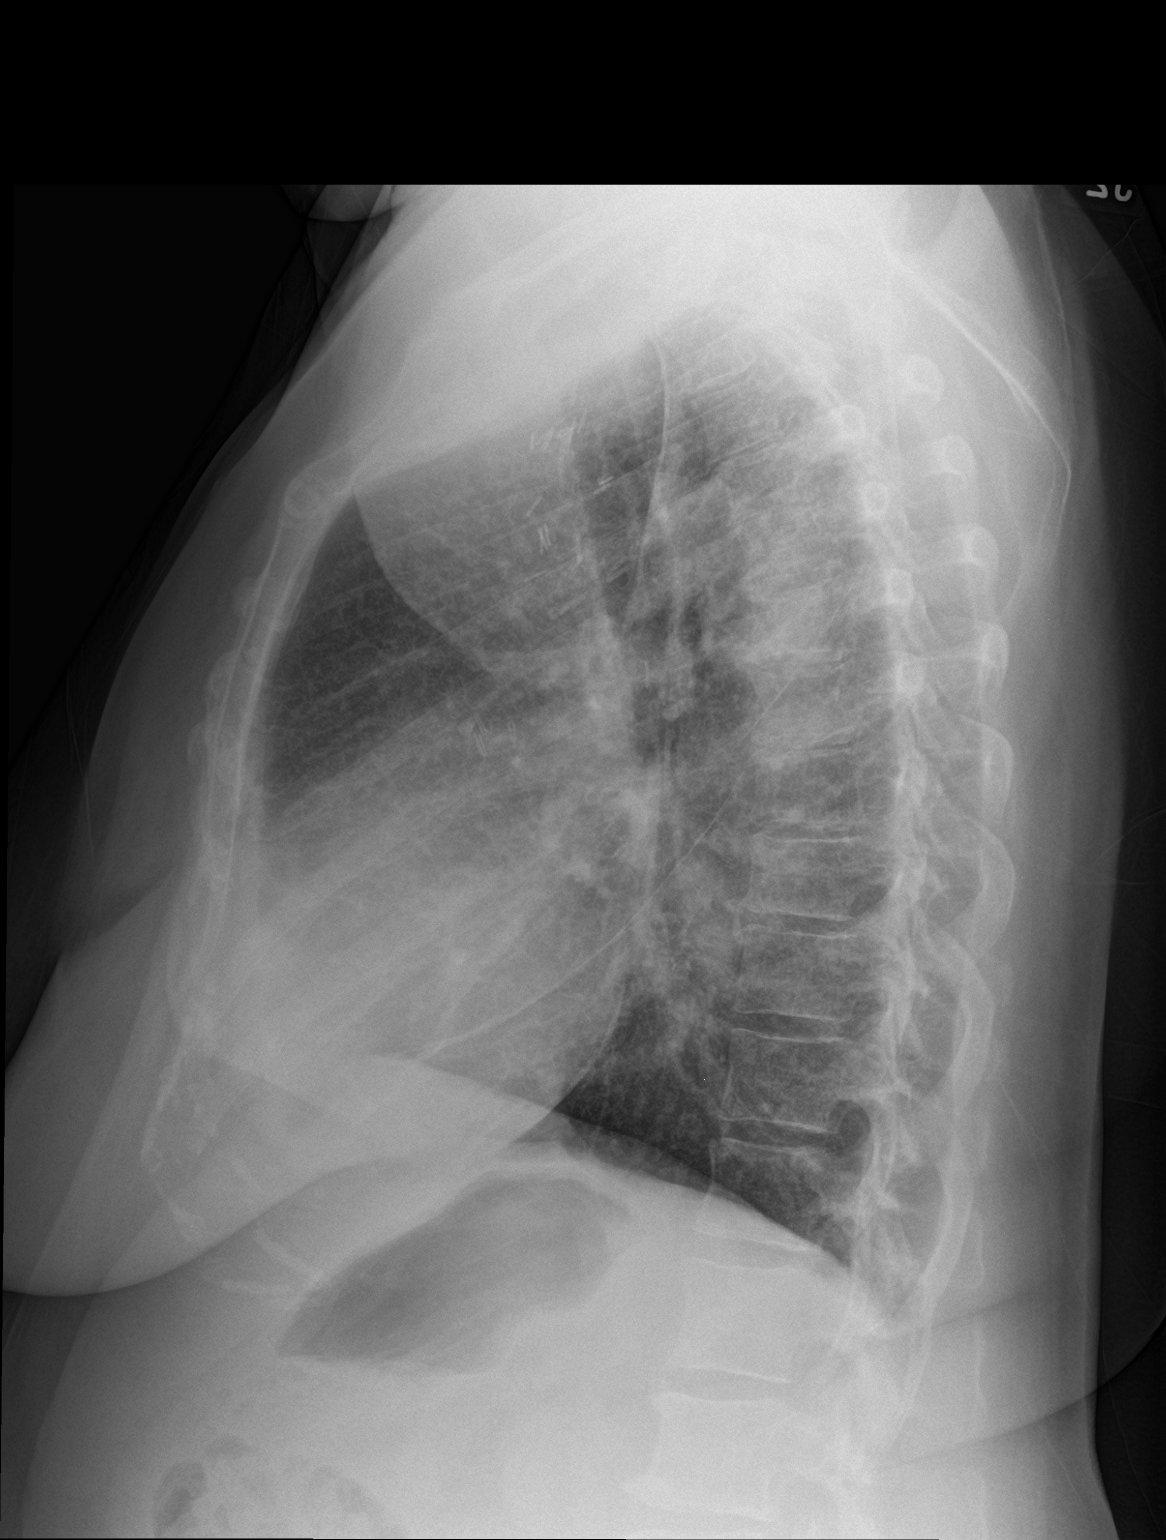

[2 of 2 positions shown; findings below may reference images not displayed]

FINDINGS: The lungs remain hyperinflated. The interstitial markings are
diffusely increased. This is new. The cardiac silhouette is mildly
enlarged and more conspicuous than on the previous study. There is
mild central pulmonary vascular prominence. There is no alveolar
infiltrate or pleural effusion. There is no pneumothorax. There are
surgical clips in the right axillary region from previous axillary
lymph node dissection. The patient has undergone previous right
mastectomy.
IMPRESSION: COPD. New diffusely increased interstitial densities in both lungs.
This may reflect interstitial edema secondary to CHF or interstitial
pneumonia. Less likely would be lymphangitic spread of malignancy.
Follow-up radiographs following therapy are recommended to assure
clearing. Chest CT scanning may be ultimately indicated.

## 2016-03-08 IMAGING — CT CT HEAD W/O CM
1 series · 16 of 30 positions shown, 20 images · non-contrast
Comparison: 09/28/2014

CLINICAL DATA: Respiratory arrest.

EXAM:
CT HEAD WITHOUT CONTRAST
TECHNIQUE: Contiguous axial images were obtained from the base of the skull
through the vertex without intravenous contrast.

[Series 2: head 5.0 h30s · axial · 0.44mm/px · z∈[-126,+9]mm · 16 of 31 slices shown, 20 images]
[im 2/31  brain]
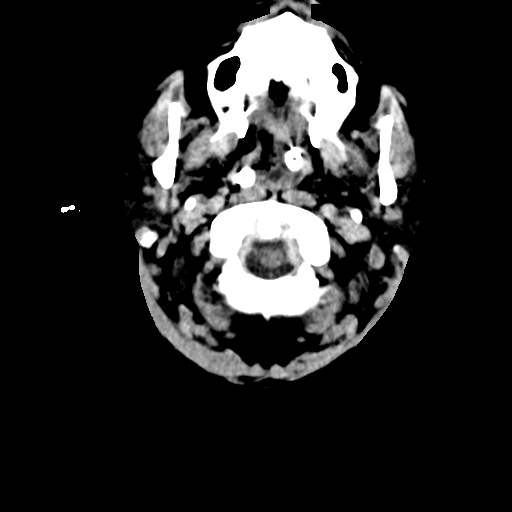
[im 2/31  bone]
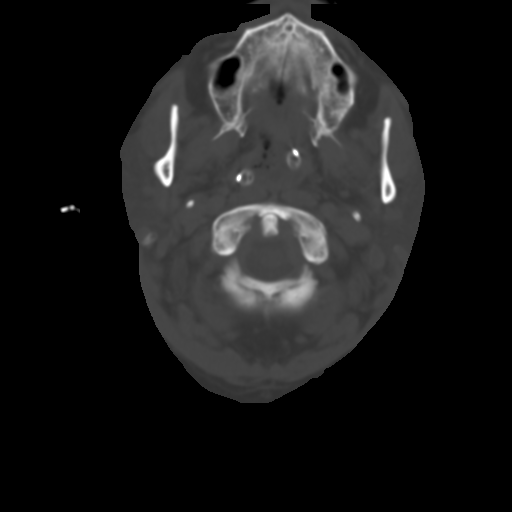
[im 4/31  brain]
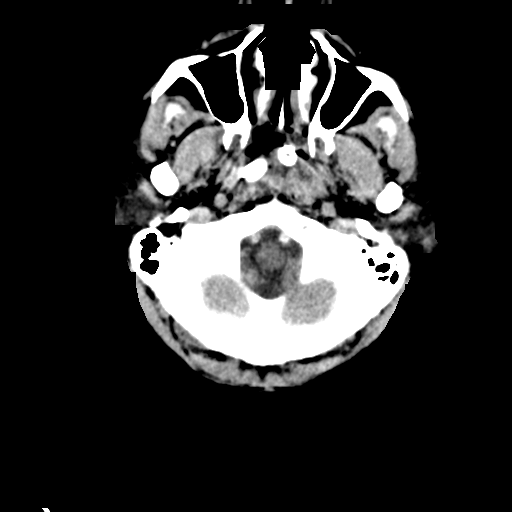
[im 6/31  brain]
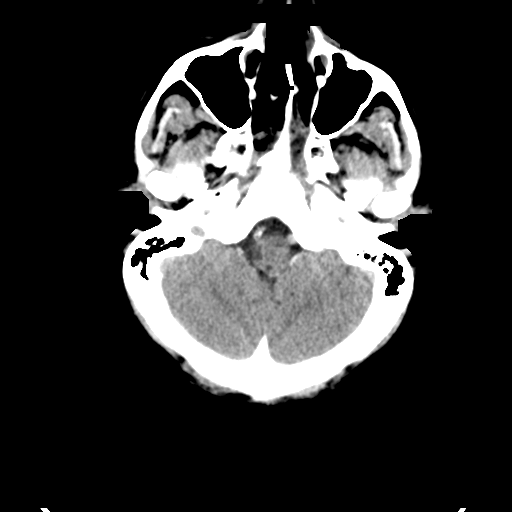
[im 8/31  brain]
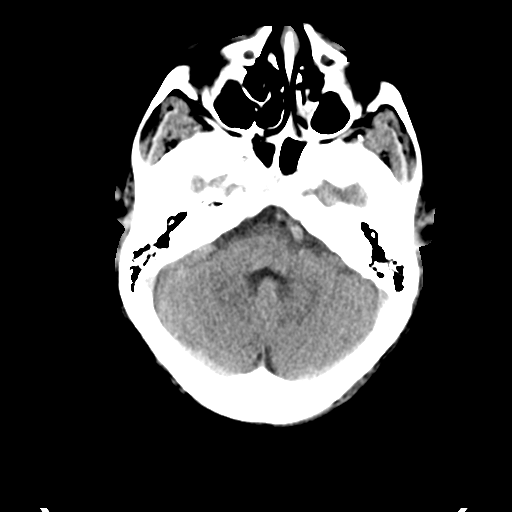
[im 9/31  brain]
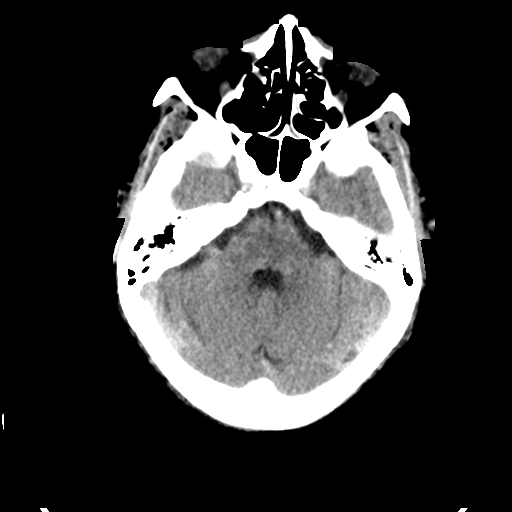
[im 9/31  bone]
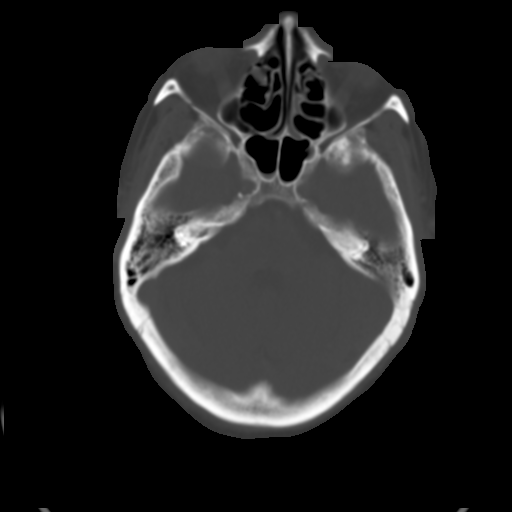
[im 11/31  brain]
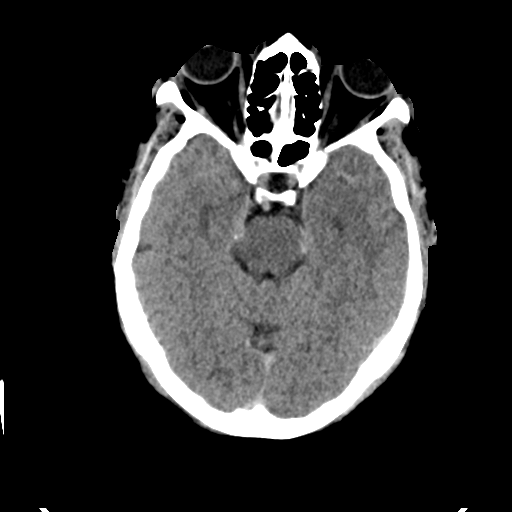
[im 13/31  brain]
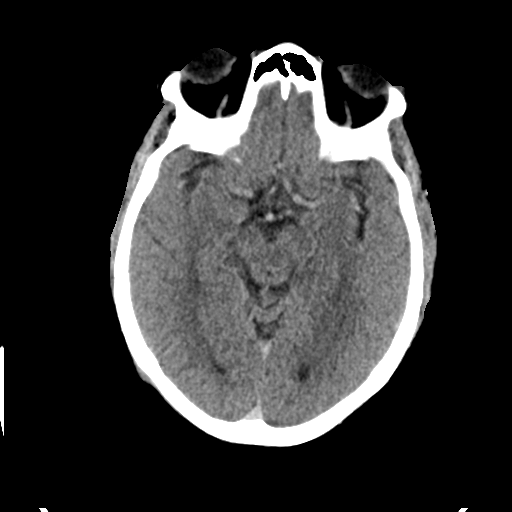
[im 15/31  brain]
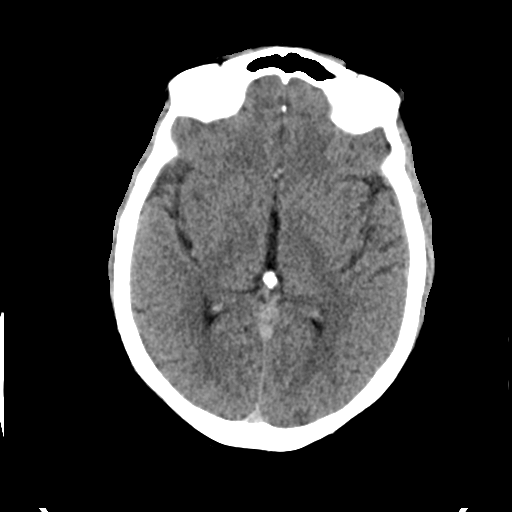
[im 16/31  brain]
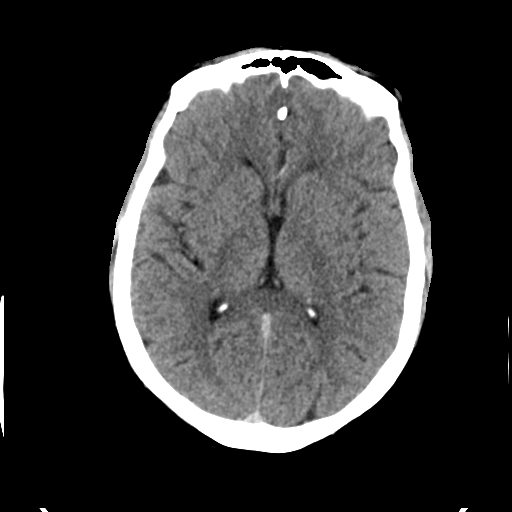
[im 16/31  bone]
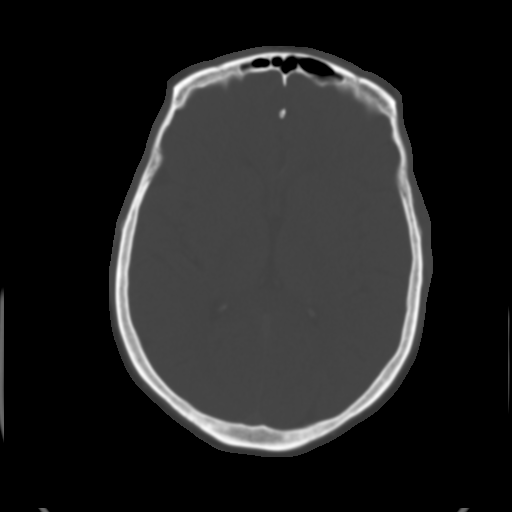
[im 18/31  brain]
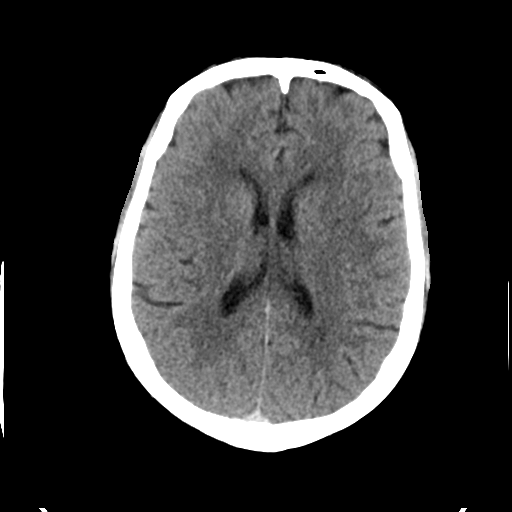
[im 20/31  brain]
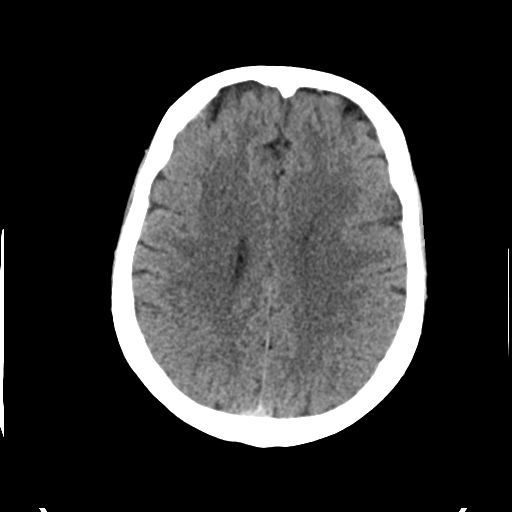
[im 22/31  brain]
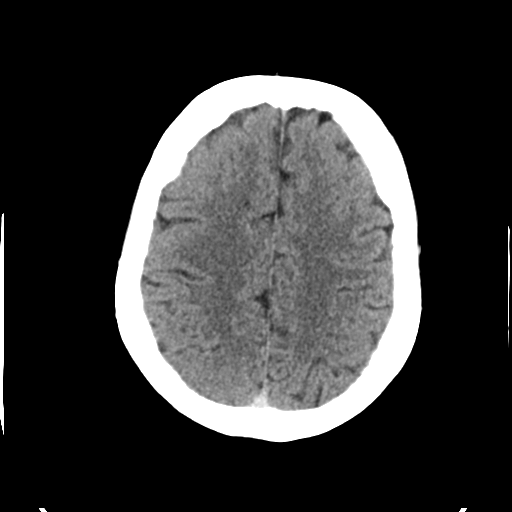
[im 23/31  brain]
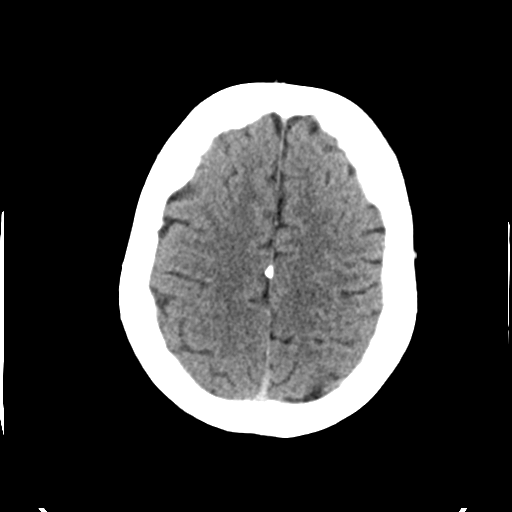
[im 23/31  bone]
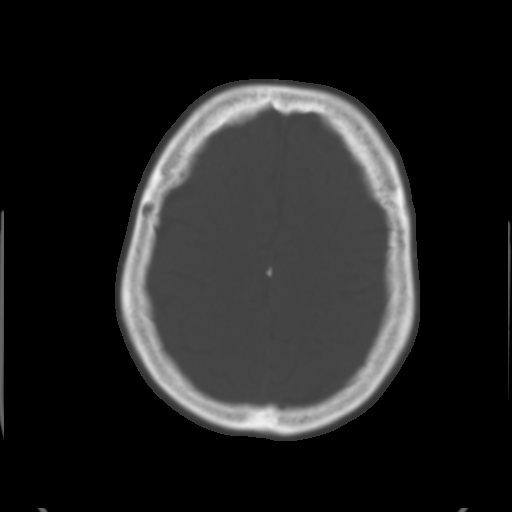
[im 25/31  brain]
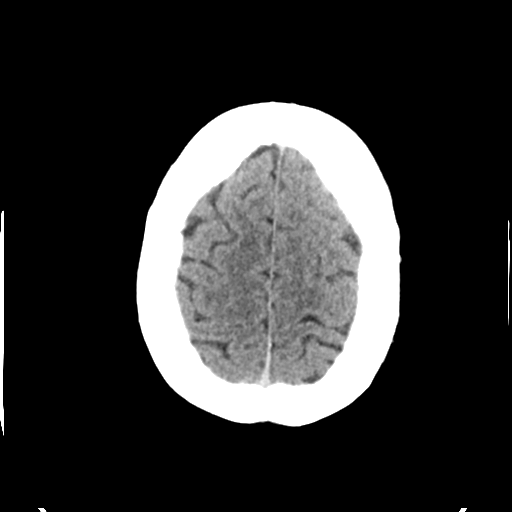
[im 27/31  brain]
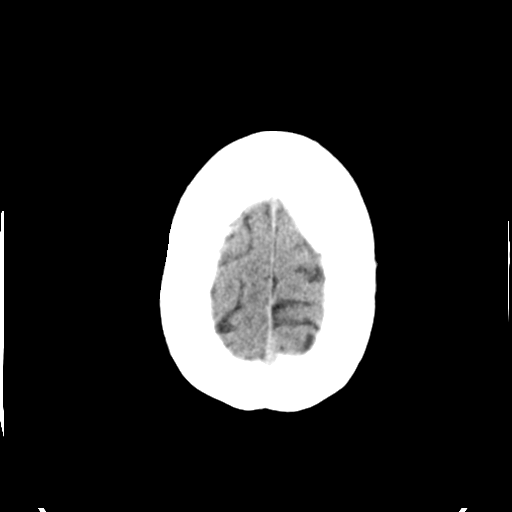
[im 29/31  brain]
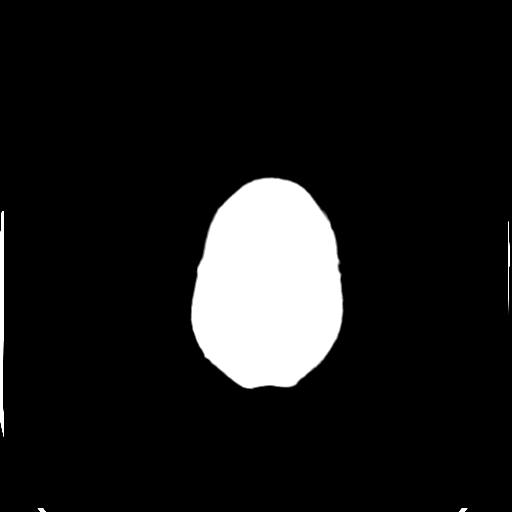

[16 of 30 positions shown; findings below may reference images not displayed]

FINDINGS: There is no intracranial hemorrhage, mass or evidence of acute
infarction. There is encephalomalacia due to remote right medullary
infarction. There is mild generalized atrophy. There is mild
hemispheric hypodensity in the white matter consistent with chronic
small vessel disease, more prominent in the periventricular frontal
white matter. There is no extra-axial fluid collection. No bone
abnormality is evident.
IMPRESSION: No acute intracranial findings. Mild atrophy and moderate chronic
small vessel disease. Remote right medullary infarction.
